# Patient Record
Sex: Male | Born: 1970 | Race: White | Hispanic: No | Smoking: Former smoker
Health system: Southern US, Community
[De-identification: ages and names within clinical notes are randomized; demographics above are authoritative.]

## PROBLEM LIST (undated history)

## (undated) DIAGNOSIS — I82409 Acute embolism and thrombosis of unspecified deep veins of unspecified lower extremity: Secondary | ICD-10-CM

## (undated) DIAGNOSIS — R569 Unspecified convulsions: Secondary | ICD-10-CM

## (undated) DIAGNOSIS — R7989 Other specified abnormal findings of blood chemistry: Secondary | ICD-10-CM

## (undated) DIAGNOSIS — I2699 Other pulmonary embolism without acute cor pulmonale: Secondary | ICD-10-CM

## (undated) DIAGNOSIS — S43003A Unspecified subluxation of unspecified shoulder joint, initial encounter: Secondary | ICD-10-CM

## (undated) DIAGNOSIS — I69891 Dysphagia following other cerebrovascular disease: Secondary | ICD-10-CM

## (undated) DIAGNOSIS — F329 Major depressive disorder, single episode, unspecified: Secondary | ICD-10-CM

## (undated) DIAGNOSIS — K219 Gastro-esophageal reflux disease without esophagitis: Secondary | ICD-10-CM

## (undated) DIAGNOSIS — N319 Neuromuscular dysfunction of bladder, unspecified: Secondary | ICD-10-CM

## (undated) DIAGNOSIS — I517 Cardiomegaly: Secondary | ICD-10-CM

## (undated) DIAGNOSIS — Z982 Presence of cerebrospinal fluid drainage device: Secondary | ICD-10-CM

## (undated) DIAGNOSIS — R519 Headache, unspecified: Secondary | ICD-10-CM

## (undated) DIAGNOSIS — J45909 Unspecified asthma, uncomplicated: Secondary | ICD-10-CM

## (undated) DIAGNOSIS — K592 Neurogenic bowel, not elsewhere classified: Secondary | ICD-10-CM

## (undated) DIAGNOSIS — I639 Cerebral infarction, unspecified: Secondary | ICD-10-CM

## (undated) DIAGNOSIS — I10 Essential (primary) hypertension: Secondary | ICD-10-CM

## (undated) DIAGNOSIS — H53469 Homonymous bilateral field defects, unspecified side: Secondary | ICD-10-CM

## (undated) DIAGNOSIS — N390 Urinary tract infection, site not specified: Secondary | ICD-10-CM

## (undated) DIAGNOSIS — G8190 Hemiplegia, unspecified affecting unspecified side: Secondary | ICD-10-CM

## (undated) DIAGNOSIS — G629 Polyneuropathy, unspecified: Secondary | ICD-10-CM

## (undated) HISTORY — DX: Homonymous bilateral field defects, unspecified side: H53.469

## (undated) HISTORY — DX: Dysphagia following other cerebrovascular disease: I69.891

## (undated) HISTORY — DX: Hemiplegia, unspecified affecting unspecified side: G81.90

## (undated) HISTORY — DX: Gastro-esophageal reflux disease without esophagitis: K21.9

## (undated) HISTORY — DX: Other pulmonary embolism without acute cor pulmonale: I26.99

## (undated) HISTORY — DX: Polyneuropathy, unspecified: G62.9

## (undated) HISTORY — DX: Cardiomegaly: I51.7

## (undated) HISTORY — DX: Neurogenic bowel, not elsewhere classified: K59.2

## (undated) HISTORY — DX: Headache, unspecified: R51.9

## (undated) HISTORY — DX: Unspecified subluxation of unspecified shoulder joint, initial encounter: S43.003A

## (undated) HISTORY — DX: Other specified abnormal findings of blood chemistry: R79.89

## (undated) HISTORY — DX: Neuromuscular dysfunction of bladder, unspecified: N31.9

## (undated) HISTORY — DX: Presence of cerebrospinal fluid drainage device: Z98.2

---

## 2015-01-25 DIAGNOSIS — Z8673 Personal history of transient ischemic attack (TIA), and cerebral infarction without residual deficits: Secondary | ICD-10-CM

## 2015-01-25 HISTORY — DX: Personal history of transient ischemic attack (TIA), and cerebral infarction without residual deficits: Z86.73

## 2015-01-25 HISTORY — PX: IVC FILTER INSERTION: CATH118245

## 2015-01-25 HISTORY — PX: PEG TUBE PLACEMENT: SUR1034

## 2015-01-25 HISTORY — PX: CRANIECTOMY: SHX331

## 2016-01-13 DIAGNOSIS — I2699 Other pulmonary embolism without acute cor pulmonale: Secondary | ICD-10-CM

## 2016-01-13 HISTORY — DX: Other pulmonary embolism without acute cor pulmonale: I26.99

## 2016-01-21 ENCOUNTER — Encounter: Payer: Self-pay | Admitting: Primary Care

## 2016-01-23 ENCOUNTER — Encounter: Payer: Self-pay | Admitting: Primary Care

## 2016-01-25 ENCOUNTER — Encounter: Payer: Self-pay | Admitting: Primary Care

## 2016-01-25 HISTORY — PX: CRANIOPLASTY: SHX1407

## 2016-01-25 HISTORY — PX: VENTRICULOPERITONEAL SHUNT: SHX204

## 2016-01-25 LAB — PROTIME-INR

## 2016-01-27 ENCOUNTER — Encounter: Payer: Self-pay | Admitting: Primary Care

## 2016-02-05 ENCOUNTER — Encounter: Payer: Self-pay | Admitting: Primary Care

## 2016-02-07 ENCOUNTER — Encounter: Payer: Self-pay | Admitting: Primary Care

## 2016-02-15 ENCOUNTER — Encounter: Payer: Self-pay | Admitting: Primary Care

## 2016-02-21 ENCOUNTER — Encounter: Payer: Self-pay | Admitting: Primary Care

## 2016-03-02 ENCOUNTER — Encounter: Payer: Self-pay | Admitting: Primary Care

## 2016-03-07 ENCOUNTER — Encounter: Payer: Self-pay | Admitting: Primary Care

## 2016-04-13 ENCOUNTER — Encounter: Payer: Self-pay | Admitting: Primary Care

## 2016-04-13 DIAGNOSIS — S43003A Unspecified subluxation of unspecified shoulder joint, initial encounter: Secondary | ICD-10-CM

## 2016-04-13 DIAGNOSIS — I517 Cardiomegaly: Secondary | ICD-10-CM

## 2016-04-13 HISTORY — DX: Unspecified subluxation of unspecified shoulder joint, initial encounter: S43.003A

## 2016-04-13 HISTORY — DX: Cardiomegaly: I51.7

## 2016-05-06 ENCOUNTER — Encounter: Payer: Self-pay | Admitting: Primary Care

## 2016-05-09 ENCOUNTER — Encounter: Payer: Self-pay | Admitting: Primary Care

## 2017-04-23 ENCOUNTER — Encounter (HOSPITAL_COMMUNITY): Payer: Self-pay | Admitting: Emergency Medicine

## 2017-04-23 ENCOUNTER — Emergency Department (HOSPITAL_COMMUNITY): Payer: 59

## 2017-04-23 ENCOUNTER — Emergency Department (HOSPITAL_COMMUNITY)
Admission: EM | Admit: 2017-04-23 | Discharge: 2017-04-23 | Disposition: A | Discharge status: Home / Self Care | Payer: 59 | Attending: Emergency Medicine | Admitting: Emergency Medicine

## 2017-04-23 ENCOUNTER — Other Ambulatory Visit: Payer: Self-pay

## 2017-04-23 DIAGNOSIS — J45909 Unspecified asthma, uncomplicated: Secondary | ICD-10-CM | POA: Diagnosis not present

## 2017-04-23 DIAGNOSIS — R569 Unspecified convulsions: Secondary | ICD-10-CM | POA: Diagnosis not present

## 2017-04-23 DIAGNOSIS — F329 Major depressive disorder, single episode, unspecified: Secondary | ICD-10-CM | POA: Insufficient documentation

## 2017-04-23 DIAGNOSIS — I10 Essential (primary) hypertension: Secondary | ICD-10-CM | POA: Diagnosis not present

## 2017-04-23 DIAGNOSIS — Z8673 Personal history of transient ischemic attack (TIA), and cerebral infarction without residual deficits: Secondary | ICD-10-CM | POA: Insufficient documentation

## 2017-04-23 HISTORY — DX: Gastro-esophageal reflux disease without esophagitis: K21.9

## 2017-04-23 HISTORY — DX: Unspecified convulsions: R56.9

## 2017-04-23 HISTORY — DX: Unspecified asthma, uncomplicated: J45.909

## 2017-04-23 HISTORY — DX: Urinary tract infection, site not specified: N39.0

## 2017-04-23 HISTORY — DX: Essential (primary) hypertension: I10

## 2017-04-23 HISTORY — DX: Acute embolism and thrombosis of unspecified deep veins of unspecified lower extremity: I82.409

## 2017-04-23 HISTORY — DX: Major depressive disorder, single episode, unspecified: F32.9

## 2017-04-23 HISTORY — DX: Cerebral infarction, unspecified: I63.9

## 2017-04-23 LAB — CBC WITH DIFFERENTIAL/PLATELET
BASOS PCT: 0 %
Basophils Absolute: 0 10*3/uL (ref 0.0–0.1)
EOS PCT: 3 %
Eosinophils Absolute: 0.2 10*3/uL (ref 0.0–0.7)
HCT: 45.5 % (ref 39.0–52.0)
Hemoglobin: 14.3 g/dL (ref 13.0–17.0)
Lymphocytes Relative: 30 %
Lymphs Abs: 2.1 10*3/uL (ref 0.7–4.0)
MCH: 27.5 pg (ref 26.0–34.0)
MCHC: 31.4 g/dL (ref 30.0–36.0)
MCV: 87.5 fL (ref 78.0–100.0)
MONO ABS: 0.5 10*3/uL (ref 0.1–1.0)
Monocytes Relative: 8 %
Neutro Abs: 4.1 10*3/uL (ref 1.7–7.7)
Neutrophils Relative %: 59 %
PLATELETS: 173 10*3/uL (ref 150–400)
RBC: 5.2 MIL/uL (ref 4.22–5.81)
RDW: 13.9 % (ref 11.5–15.5)
WBC: 6.9 10*3/uL (ref 4.0–10.5)

## 2017-04-23 LAB — COMPREHENSIVE METABOLIC PANEL
ALBUMIN: 4 g/dL (ref 3.5–5.0)
ALT: 21 U/L (ref 17–63)
ANION GAP: 13 (ref 5–15)
AST: 19 U/L (ref 15–41)
Alkaline Phosphatase: 104 U/L (ref 38–126)
BUN: 12 mg/dL (ref 6–20)
CO2: 23 mmol/L (ref 22–32)
Calcium: 8.6 mg/dL — ABNORMAL LOW (ref 8.9–10.3)
Chloride: 106 mmol/L (ref 101–111)
Creatinine, Ser: 0.77 mg/dL (ref 0.61–1.24)
GFR calc Af Amer: >60 mL/min (ref >60)
Glucose, Bld: 110 mg/dL — ABNORMAL HIGH (ref 65–99)
POTASSIUM: 3.7 mmol/L (ref 3.5–5.1)
Sodium: 142 mmol/L (ref 135–145)
Total Bilirubin: 0.5 mg/dL (ref 0.3–1.2)
Total Protein: 6.2 g/dL — ABNORMAL LOW (ref 6.5–8.1)

## 2017-04-23 MED ORDER — LACOSAMIDE 50 MG PO TABS
100.0000 mg | ORAL_TABLET | Freq: Two times a day (BID) | ORAL | Status: DC
  Administered 2017-04-23: 100 mg via ORAL
  Filled 2017-04-23: qty 2

## 2017-04-23 MED ORDER — SODIUM CHLORIDE 0.9 % IV BOLUS
1000.0000 mL | Freq: Once | INTRAVENOUS | Status: AC
  Administered 2017-04-23: 1000 mL via INTRAVENOUS

## 2017-04-23 MED ORDER — LORAZEPAM 2 MG/ML IJ SOLN
0.5000 mg | Freq: Once | INTRAMUSCULAR | Status: AC
  Administered 2017-04-23: 0.5 mg via INTRAVENOUS
  Filled 2017-04-23: qty 1

## 2017-04-23 NOTE — ED Notes (Signed)
Pt understood dc material. NAD noted

## 2017-04-23 NOTE — Discharge Instructions (Addendum)
Continue your current medications without change. Call [facility] for follow up appointment

## 2017-04-23 NOTE — ED Provider Notes (Signed)
[facility] EMERGENCY [facility] Provider Note   CSN: [id number] Arrival date & time: 04/23/17  1724     History   Chief Complaint Chief Complaint  Patient presents with   Seizures    HPI Rinaldo RatelChristopher L Bick is a 47 y.o. male.  Chief complaint is seizure.  HPI: 47 year old male.  History of previous CVA.  History of previous craniectomy due to increased ICPs during acute hospitalization of seizures.  Has VP shunt.  Has seizure disorder.    Past Medical History:  Diagnosis Date   Asthma    Depression    DVT (deep venous thrombosis) (HCC)    GERD (gastroesophageal reflux disease)    Hypertension    Seizures (HCC)    Stroke (HCC) 12/31/2015   left side deficits    UTI (urinary tract infection)     There are no active problems to display for this patient.   Past Surgical History:  Procedure Laterality Date   CRANIECTOMY  2017   CRANIOPLASTY  2018   IVC FILTER INSERTION  2017        Home Medications    Prior to Admission medications   Not on File    Family History No family history on file.  Social History Social History   Tobacco Use   Smoking status: Never Smoker   Smokeless tobacco: Never Used  Substance Use Topics   Alcohol use: Not Currently    Frequency: Never   Drug use: Never     Allergies   Versed [midazolam]   Review of Systems Review of Systems   Physical Exam Updated Vital Signs BP 127/86    Pulse 95    Temp 97.6 F (36.4 C) (Oral)    Resp 16    SpO2 100%   Physical Exam   ED Treatments / Results  Labs (all labs ordered are listed, but only abnormal results are displayed) Labs Reviewed  COMPREHENSIVE METABOLIC PANEL - Abnormal; Notable for the following components:      Result Value   Glucose, Bld 110 (*)    Calcium 8.6 (*)    Total Protein 6.2 (*)    All other components within normal limits  CBC WITH DIFFERENTIAL/PLATELET    EKG EKG Interpretation  Date/Time:  Sunday April 23 2017 17:34:06 EDT Ventricular Rate:  92 PR Interval:    QRS Duration: 100 QT Interval:  366 QTC Calculation: 453 R Axis:   49 Text Interpretation:  Sinus rhythm No old tracing to compare Confirmed by Jerelyn ScottLinker, Martha [phone]([id number]) on 04/24/2017 7:47:08 PM   Radiology Ct Head Wo Contrast  Result Date: 04/23/2017 CLINICAL DATA:  Seizure EXAM: CT HEAD WITHOUT CONTRAST TECHNIQUE: Contiguous axial images were obtained from the base of the skull through the vertex without intravenous contrast. COMPARISON:  None. FINDINGS: Brain: Severe encephalomalacia of the entire right MCA territory. Left parietal approach shunt catheter tip is in the left lateral ventricle. No hydrocephalus. There is a hyperdense focus in the right lateral ventricle measuring 7 x 6 mm. There is ex vacuo rightward midline shift. No focal parenchymal abnormality of the left hemisphere. Vascular: No hyperdense vessel or unexpected vascular calcification. Skull: Remote right pterional craniotomy. Sinuses/Orbits: No sinus fluid levels or advanced mucosal thickening. No mastoid effusion. Normal orbits. IMPRESSION: 1. Left parietal approach shunt catheter tip in the left lateral ventricle. No hydrocephalus. 2. Complete right MCA territory encephalomalacia. 3. Hyperdense focus in the anterior right lateral ventricle is indeterminate, possibly choroid plexus physiologic calcification or partially  calcified choroid plexus cyst, though this is relatively far anterior for the choroid plexus. This would be an unusual location for hemorrhage. If prior studies are available, comparison would be helpful. Electronically Signed   By: Deatra Robinson M.D.   On: 04/23/2017 19:24    Procedures Procedures (including critical care time)  Medications Ordered in ED Medications  sodium chloride 0.9 % bolus 1,000 mL (0 mLs Intravenous Stopped 04/23/17 1926)  LORazepam (ATIVAN) injection 0.5 mg (0.5 mg Intravenous Given 04/23/17 1855)     Initial Impression /  Assessment and Plan / ED Course  I have reviewed the triage vital signs and the nursing notes.  Pertinent labs & imaging results that were available during my care of the patient were reviewed by me and considered in my medical decision making (see chart for details).    CT without changes suggestive of shunt malfunction.  Remains awake alert oriented.  Plan discharged home.  Neurological follow-up locally.  Recheck recurrence.  Final Clinical Impressions(s) / ED Diagnoses   Final diagnoses:  Seizure [facility])    ED Discharge Orders    None       Rolland Porter, MD 04/24/17 2341

## 2017-04-23 NOTE — ED Triage Notes (Signed)
Pt arrives via EMS from home with reports of seizure lasting 10 mins witnessed by family. Hx of CVA with left side deficits and 2 seizures. Denies pain. Pt has been nauseous and vomited multiple times. EMS gave 8mg  zofran CBG 123, 131/89, HR 100, 98% RA

## 2017-04-24 ENCOUNTER — Encounter: Payer: Self-pay | Admitting: Neurology

## 2017-04-26 ENCOUNTER — Ambulatory Visit: Payer: Self-pay | Admitting: Primary Care

## 2017-05-01 ENCOUNTER — Encounter: Payer: Self-pay | Admitting: Neurology

## 2017-05-01 ENCOUNTER — Other Ambulatory Visit: Payer: Self-pay

## 2017-05-01 ENCOUNTER — Ambulatory Visit (INDEPENDENT_AMBULATORY_CARE_PROVIDER_SITE_OTHER): Payer: PRIVATE HEALTH INSURANCE | Admitting: Neurology

## 2017-05-01 VITALS — BP 114/80 | HR 72 | Ht 68.0 in | Wt 175.0 lb

## 2017-05-01 DIAGNOSIS — Z9889 Other specified postprocedural states: Secondary | ICD-10-CM

## 2017-05-01 DIAGNOSIS — Z8673 Personal history of transient ischemic attack (TIA), and cerebral infarction without residual deficits: Secondary | ICD-10-CM

## 2017-05-01 DIAGNOSIS — G40209 Localization-related (focal) (partial) symptomatic epilepsy and epileptic syndromes with complex partial seizures, not intractable, without status epilepticus: Secondary | ICD-10-CM | POA: Diagnosis not present

## 2017-05-01 MED ORDER — METHYLPHENIDATE HCL ER (LA) 60 MG PO CP24: 1.0000 | ORAL_CAPSULE | Freq: Every day | ORAL | 0 refills | Status: DC

## 2017-05-01 MED ORDER — CLONAZEPAM 0.5 MG PO TBDP: ORAL_TABLET | ORAL | 5 refills | Status: DC

## 2017-05-01 MED ORDER — LACOSAMIDE 150 MG PO TABS: ORAL_TABLET | ORAL | 5 refills | Status: DC

## 2017-05-01 NOTE — Patient Instructions (Signed)
1. Increase Vimpat to 150mg  twice a day 2. Continue all other medications. We can prescribe Ritalin until you see your PCP 3. May give clonazepam 0.5mg  as needed for seizure 4. Refer to Neurosurgery for local follow-up  5. Follow-up in 4-5 months, call for any changes  Seizure Precautions: 1. If medication has been prescribed for you to prevent seizures, take it exactly as directed.  Do not stop taking the medicine without talking to your doctor first, even if you have not had a seizure in a long time.   2. Avoid activities in which a seizure would cause danger to yourself or to others.  Don't operate dangerous machinery, swim alone, or climb in high or dangerous places, such as on ladders, roofs, or girders.  Do not drive unless your doctor says you may.  3. If you have any warning that you may have a seizure, lay down in a safe place where you can't hurt yourself.    4.  No driving for 6 months from last seizure, as per [facility] [place].   Please refer to the following link on the Epilepsy Foundation of America's website for more information: http://www.epilepsyfoundation.org/answerplace/Social/driving/drivingu.cfm   5.  Maintain good sleep hygiene. Avoid alcohol.  Benay Pillow.  Contact your doctor if you have any problems that may be related to the medicine you are taking.  7.  Call 911 and bring the patient back to the ED if:        A.  The seizure lasts longer than 5 minutes.       B.  The patient doesn't awaken shortly after the seizure  C.  The patient has new problems such as difficulty seeing, speaking or moving  D.  The patient was injured during the seizure  E.  The patient has a temperature over 102 F (39C)  F.  The patient vomited and now is having trouble breathing

## 2017-05-01 NOTE — Progress Notes (Signed)
NEUROLOGY CONSULTATION NOTE  Patrick Franklin MRN: [id number] DOB: [birth year]  Referring provider: Dr. Rolland Porter Primary care provider: Vernona Rieger, NP  Reason for consult:  Seizures, history of stroke  Thank you for your kind referral of Patrick Franklin for consultation of the above symptoms. Although his history is well known to you, please allow me to reiterate it for the purpose of our medical record. The patient was accompanied to the clinic by his sister and brother-in-law who also provide collateral information. Records and images were personally reviewed where available.  HISTORY OF PRESENT ILLNESS: This is a pleasant 47 year old right-handed man with a history of right MCA stroke s/p hemicraniectomy with subsequent seizures, presenting to establish care. He and his sister report that he had a stroke in December 2017, then has had 3 seizures since then. The first occurred summer 2018 with no prior warning, he woke up with sore muscles, tongue bite and urinary incontinence. The second seizure occurred in February 2019 in his sleep where his wife reported extremities were extended with tongue bite. The most recent one was last 04/23/17 witnessed by his family. His sister reports they were eating then he slumped forward and arms stiffened with shaking for 2 minutes, left arm was drawn up. He was unresponsive with head turn to the left. After 2-3 minutes, he would calm down, then start having movements but able to answer their questions. He had a lot of vomiting after both seizures. He reports a lot of pressure behind his eyes when he had the seizure. He was brought to [facility] ER where CBC and CMP were unremarkable. I personally reviewed head CT without contrast which showed severe right MCA encephalomalacia, left parietal approach shunt catheter tip in left lateral ventricle, no hydrocephalus. There was a hyperdense focus in the right lateral ventricle, ex vacuo rightward midline shift. He  was continued on Vimpat 100mg  BID. He denies any missed medications or sleep deprivation, but there has been a lot of stress with moving to live with his sister and brother-in-law away from his wife. They have a sitter with him during the day while his family is working.   He has pressure behind his eyes on a daily basis. Eye exam reported as normal. He has some neck pain. He has residual weakness on the left side, denies any numbness/tingling or pain. He denies any olfactory/gustatory hallucinations, deja vu, rising epigastric sensation, myoclonic jerks. Mood is okay. His sister has noticed he stares off at times, but replies when called. Family fills his pillbox for him. He is also taking Ritalin prescribed at rehab because he was having attention difficulties and needed prompting to initiate activities, not progressing with therapy. They feel this has been very helpful for him, his brother-in-law noticed a difference in interaction when he ran out of medication, he was less interactive. He states he does not take it all the time. There is a family history of stroke. No family history of seizures.  Epilepsy Risk Factors:  Complete right MCA territory encephalomalacia s/p right hemicraniectomy. Otherwise he had a normal birth and early development.  There is no history of febrile convulsions, CNS infections such as meningitis/encephalitis, or family history of seizures.  PAST MEDICAL HISTORY: Past Medical History:  Diagnosis Date   Asthma    Depression    DVT (deep venous thrombosis) (HCC)    GERD (gastroesophageal reflux disease)    Hypertension    Seizures (HCC)    Stroke (HCC) 12/31/2015  left side deficits    UTI (urinary tract infection)     PAST SURGICAL HISTORY: Past Surgical History:  Procedure Laterality Date   CRANIECTOMY  2017   CRANIOPLASTY  2018   IVC FILTER INSERTION  2017    MEDICATIONS: Vimpat 100mg  BID Ritalin LA 60mg  daily   ALLERGIES: Allergies   Allergen Reactions   Versed [Midazolam]     FAMILY HISTORY: Family History  Problem Relation Age of Onset   Stroke Maternal Uncle    Stroke Maternal Uncle     SOCIAL HISTORY: Social History   Socioeconomic History   Marital status: Single    Spouse name: Not on file   Number of children: Not on file   Years of education: Not on file   Highest education level: Not on file  Occupational History   Not on file  Social Needs   Financial resource strain: Not on file   Food insecurity:    Worry: Not on file    Inability: Not on file   Transportation needs:    Medical: Not on file    Non-medical: Not on file  Tobacco Use   Smoking status: Never Smoker   Smokeless tobacco: Never Used  Substance and Sexual Activity   Alcohol use: Not Currently    Frequency: Never   Drug use: Never   Sexual activity: Not on file  Lifestyle   Physical activity:    Days per week: Not on file    Minutes per session: Not on file   Stress: Not on file  Relationships   Social connections:    Talks on phone: Not on file    Gets together: Not on file    Attends religious service: Not on file    Active member of club or organization: Not on file    Attends meetings of clubs or organizations: Not on file    Relationship status: Not on file   Intimate partner violence:    Fear of current or ex partner: Not on file    Emotionally abused: Not on file    Physically abused: Not on file    Forced sexual activity: Not on file  Other Topics Concern   Not on file  Social History Narrative   Not on file    REVIEW OF SYSTEMS: Constitutional: No fevers, chills, or sweats, no generalized fatigue, change in appetite Eyes: No visual changes, double vision, eye pain Ear, nose and throat: No hearing loss, ear pain, nasal congestion, sore throat Cardiovascular: No chest pain, palpitations Respiratory:  No shortness of breath at rest or with exertion, wheezes GastrointestinaI: No  nausea, vomiting, diarrhea, abdominal pain, fecal incontinence Genitourinary:  No dysuria, urinary retention or frequency Musculoskeletal:  + neck pain, back pain Integumentary: No rash, pruritus, skin lesions Neurological: as above Psychiatric: No depression, insomnia, anxiety Endocrine: No palpitations, fatigue, diaphoresis, mood swings, change in appetite, change in weight, increased thirst Hematologic/Lymphatic:  No anemia, purpura, petechiae. Allergic/Immunologic: no itchy/runny eyes, nasal congestion, recent allergic reactions, rashes  PHYSICAL EXAM: Vitals:   05/01/17 1408  BP: 114/80  Pulse: 72  SpO2: 97%   General: No acute distress, sitting on wheelchair Head:  Normocephalic/atraumatic Eyes: Fundoscopic exam shows bilateral sharp discs, no vessel changes, exudates, or hemorrhages Neck: supple, no paraspinal tenderness, full range of motion Back: No paraspinal tenderness Heart: regular rate and rhythm Lungs: Clear to auscultation bilaterally. Vascular: No carotid bruits. Skin/Extremities: No rash, no edema Neurological Exam: Mental status: alert and oriented to person, place,  and month, states year is 2018, slightly slow to respond but accurate responses with minimal  Dysarthria, no aphasia, Fund of knowledge is appropriate.  Recent and remote memory are intact. 3/3 delayed recall.  Attention and concentration are normal.    Able to name objects and repeat phrases. Cranial nerves: CN I: not tested CN II: pupils equal, round and reactive to light, visual fields intact but with left-sided neglect on double simultaneous stimulation, fundi unremarkable. CN III, IV, VI:  full range of motion, no nystagmus, no ptosis CN V: facial sensation intact CN VII: left facial droop CN VIII: hearing intact to finger rub CN IX, X: gag intact, uvula midline CN XI: sternocleidomastoid and trapezius muscles intact CN XII: tongue midline Bulk & Tone: normal, no fasciculations. Motor: 5/5 on  right UE and LE, 2/5 on left UE and LE Sensation: intact to light touch, cold, pin, vibration and joint position sense.  No extinction to double simultaneous stimulation.  Deep Tendon Reflexes: brisk +2 on left UE and LE, +1 on right UE and LE, no ankle clonus Plantar responses: upgoing on left, downgoing on right Cerebellar: no incoordination on finger to nose on right, difficulty with left Gait: not tested Tremor: none  IMPRESSION: This is a pleasant 47 year old right-handed man with a history of large right MCA stroke, ?etiology with subsequent focal to bilateral tonic-clonic seizures. Records from his prior neurologist will be requested for review. He recently moved to [facility] and is establishing care. Last seizure was 04/23/17, we discussed increasing Vimpat to 150mg  BID. His sister was given a prescription for prn clonazepam 0.5mg  for breakthrough seizure. They would like a referral for a local neurosurgeon for follow-up on the cranioplasty. He has been on Ritalin for attention deficits post-stroke, I discussed with them that our office does not prescribe stimulants, however since he has not established care with his new PCP, I will send in 1 refill until he sees his new PCP. He does not drive. He will follow-up in 4-5 months and knows to call for any changes.   Thank you for allowing me to participate in the care of this patient. Please do not hesitate to call for any questions or concerns.   Patrcia Dolly, M.D.  CC: Vernona Rieger, NP, Dr. Fayrene Fearing

## 2017-05-02 ENCOUNTER — Telehealth: Payer: Self-pay

## 2017-05-02 NOTE — Telephone Encounter (Signed)
Rcvd call from [place] at [facility]. The Ritalin Rx faxed in can not be filled, Pt must bring hardcopy. Called Pt, he is to have his sister p/u hardcopy.

## 2017-05-08 ENCOUNTER — Encounter: Payer: Self-pay | Admitting: Neurology

## 2017-05-15 ENCOUNTER — Encounter: Payer: Self-pay | Admitting: Primary Care

## 2017-05-15 ENCOUNTER — Ambulatory Visit (INDEPENDENT_AMBULATORY_CARE_PROVIDER_SITE_OTHER): Payer: PRIVATE HEALTH INSURANCE | Admitting: Primary Care

## 2017-05-15 VITALS — BP 140/90 | HR 79 | Temp 97.8°F | Ht 68.0 in | Wt 175.0 lb

## 2017-05-15 DIAGNOSIS — F329 Major depressive disorder, single episode, unspecified: Secondary | ICD-10-CM | POA: Insufficient documentation

## 2017-05-15 DIAGNOSIS — I10 Essential (primary) hypertension: Secondary | ICD-10-CM

## 2017-05-15 DIAGNOSIS — R339 Retention of urine, unspecified: Secondary | ICD-10-CM | POA: Diagnosis not present

## 2017-05-15 DIAGNOSIS — E785 Hyperlipidemia, unspecified: Secondary | ICD-10-CM | POA: Diagnosis not present

## 2017-05-15 DIAGNOSIS — Z8673 Personal history of transient ischemic attack (TIA), and cerebral infarction without residual deficits: Secondary | ICD-10-CM

## 2017-05-15 DIAGNOSIS — F3342 Major depressive disorder, recurrent, in full remission: Secondary | ICD-10-CM

## 2017-05-15 HISTORY — DX: Major depressive disorder, single episode, unspecified: F32.9

## 2017-05-15 HISTORY — DX: Essential (primary) hypertension: I10

## 2017-05-15 HISTORY — DX: Hyperlipidemia, unspecified: E78.5

## 2017-05-15 MED ORDER — DOXAZOSIN MESYLATE 1 MG PO TABS: ORAL_TABLET | ORAL | 3 refills | Status: DC

## 2017-05-15 MED ORDER — ATORVASTATIN CALCIUM 80 MG PO TABS: ORAL_TABLET | ORAL | 3 refills | Status: DC

## 2017-05-15 NOTE — Assessment & Plan Note (Signed)
Managed on Cymbalta 60 mg since CVA in 2017. Continue same. Denies SI/HI.

## 2017-05-15 NOTE — Assessment & Plan Note (Signed)
Repeat lipids pending today, continue atorvastatin 80 mg.

## 2017-05-15 NOTE — Assessment & Plan Note (Signed)
Managed on Doxazosin, denies urinary retention problems. Continue same. BP stable.

## 2017-05-15 NOTE — Patient Instructions (Addendum)
Start weaning off of Ritalin. Take 1 capsule by mouth every other day for 10 days then stop.  I will speak with your Neurologist regarding baclofen and dantrolene as these medications are very similar.   Stop by the lab prior to leaving today. I will notify you of your results once received.   Monitor your blood pressure at home and notify me if you see readings at or above 135/90.   Please schedule a follow up appointment in 6 months.   It was a pleasure to meet you today! Please don't hesitate to call or message me with any questions. Welcome to [facility]!

## 2017-05-15 NOTE — Progress Notes (Signed)
Subjective:    Patient ID: Patrick Franklin, male    DOB: [birth year], 47 y.o.   MRN: [id number]  HPI  Patrick Franklin is a 47 year old male who presents today to establish care and discuss the problems mentioned below. Will obtain old records.  1) Seizure Disorder: History of right MCA stroke status post hemicraniectomy with subsequent seizures in December 2017. He recently established care with Neurology on 05/01/17. His most recent seizure was on 04/23/17 witness by family members. [facility], underwent CT head which was negative for hydrocephalus, complete right MCA territory encephalomalacia, hyperdense focus to right lateral ventricle. He was managed on Vimpat and denied missing any medications. He had been under a lot of stress since moving in with family from [place].  During his visit with Neurology his Vimpat was increased to 150 mg BID. He is managed on clonazepam 0.5 mg PRN breakthrough seizures, both baclofen and dantrolene for muscle spacticity? The patient also requested a refill of Ritalin for attention deficits post stroke, one refill was granted per Neurology and then deferred to PCP.  2) Hyperlipidemia: Currently managed on atorvastatin 80 mg. His last cholesterol check was within the past one year.   3) Depression/ADHD status post stroke: Currently managed on Cymbalta 60 mg, Ritalin LA 60 mg. Managed on since stroke in December 2017. He thinks he was placed on Ritalin LA during inpatient rehabilitation as he had a difficult time focusing during physical therapy. He has missed a few doses of medication in the past and hasn't noticed any difference.   4) MCA Stroke: Occurred in December 2017. Also with subsequent seizures. Currently managed on baclofen 10 mg and dantrolene 100 mg for spasms. He's taking both baclofen and dantrolene three times daily for right upper and lower extremity spasms. Patient has residual left upper and lower extremity weakness,  left sided neglect as residual from stroke.  5) Essential Hypertension: Currently managed on metoprolol tartrate 25 mg BID, doxazosin 1 mg. Managed on doxazosin for urinary retention that was prescribed per Urology in [place] last year. He does not check his BP at home.   BP Readings from Last 3 Encounters:  05/15/17 140/90  05/01/17 114/80  04/23/17 127/86     Review of Systems  Eyes: Negative for visual disturbance.  Respiratory: Negative for shortness of breath.   Cardiovascular: Negative for chest pain.  Neurological: Negative for dizziness, seizures and headaches.       Residual left sided weakness secondary to stroke, no new weakness.  Psychiatric/Behavioral: Negative for decreased concentration and suicidal ideas.       Past Medical History:  Diagnosis Date   Asthma    Depression    DVT (deep venous thrombosis) (HCC)    GERD (gastroesophageal reflux disease)    Hypertension    Seizures (HCC)    Stroke (HCC) 12/31/2015   left side deficits    UTI (urinary tract infection)      Social History   Socioeconomic History   Marital status: Single    Spouse name: Not on file   Number of children: Not on file   Years of education: Not on file   Highest education level: Not on file  Occupational History   Not on file  Social Needs   Financial resource strain: Not on file   Food insecurity:    Worry: Not on file    Inability: Not on file   Transportation needs:  Medical: Not on file    Non-medical: Not on file  Tobacco Use   Smoking status: Never Smoker   Smokeless tobacco: Never Used  Substance and Sexual Activity   Alcohol use: Not Currently    Frequency: Never   Drug use: Never   Sexual activity: Not on file  Lifestyle   Physical activity:    Days per week: Not on file    Minutes per session: Not on file   Stress: Not on file  Relationships   Social connections:    Talks on phone: Not on file    Gets together: Not on  file    Attends religious service: Not on file    Active member of club or organization: Not on file    Attends meetings of clubs or organizations: Not on file    Relationship status: Not on file   Intimate partner violence:    Fear of current or ex partner: Not on file    Emotionally abused: Not on file    Physically abused: Not on file    Forced sexual activity: Not on file  Other Topics Concern   Not on file  Social History Narrative   Single.   4 children.    Once worked as an Teacher, adult education.    Enjoys reading.     Past Surgical History:  Procedure Laterality Date   CRANIECTOMY  2017   CRANIOPLASTY  2018   IVC FILTER INSERTION  2017   PEG TUBE PLACEMENT  2017   VENTRICULOPERITONEAL SHUNT  2018    Family History  Problem Relation Age of Onset   Stroke Maternal Uncle    Stroke Maternal Uncle     Allergies  Allergen Reactions   Versed [Midazolam]     Current Outpatient Medications on File Prior to Visit  Medication Sig Dispense Refill   aspirin EC 81 MG tablet Take 81 mg by mouth daily.     baclofen (LIORESAL) 10 MG tablet Take 25 mg by mouth 3 (three) times daily.     Cholecalciferol (VITAMIN D3) 5000 units CAPS Take 5,000 Units by mouth daily.     dantrolene (DANTRIUM) 100 MG capsule Take 100 mg by mouth 3 (three) times daily.     DULoxetine (CYMBALTA) 60 MG capsule Take 60 mg by mouth daily.     Lacosamide 150 MG TABS Take 1 tablet twice a day 60 tablet 5   Melatonin 3 MG TABS Take by mouth.     Methylphenidate HCl ER, LA, (RITALIN LA) 60 MG CP24 Take 1 tablet by mouth daily. 30 capsule 0   metoprolol tartrate (LOPRESSOR) 25 MG tablet Take 25 mg by mouth 2 (two) times daily.     clonazePAM (KLONOPIN) 0.5 MG disintegrating tablet Administer 1 tablet as needed for seizure. (Patient not taking: Reported on 05/15/2017) 10 tablet 5   No current facility-administered medications on file prior to visit.     BP 140/90    Pulse 79    Temp 97.8 F (36.6  C) (Oral)    Ht 5\' 8"  (1.727 m)    Wt 175 lb (79.4 kg)    SpO2 96%    BMI 26.61 kg/m    Objective:   Physical Exam  Constitutional: He is oriented to person, place, and time. He appears well-nourished.  Neck: Neck supple.  Cardiovascular: Normal rate and regular rhythm.  Pulmonary/Chest: Effort normal and breath sounds normal. He has no wheezes. He has no rales.  Musculoskeletal:  In wheelchair today,  left sided weakness residual from stroke.  Neurological: He is alert and oriented to person, place, and time.  Skin: Skin is warm and dry.  Psychiatric: He has a normal mood and affect.          Assessment & Plan:

## 2017-05-15 NOTE — Assessment & Plan Note (Signed)
Stable on metoprolol tartrate and doxazosin, continue same.

## 2017-05-15 NOTE — Assessment & Plan Note (Signed)
Occurred in December 2017, left sided upper and lower extremity weakness and left side neglect as residual effects.  Following with neurology now. Continue Vimpat 150 mg BID as prescribed. Will send message to Neurology regarding baclofen and dantrolene for residual spacticity as these are very similar medications.  Use Clonazepam PRN breakthrough seizures as prescribed.  Will wean off Ritalin LA as it doesn't seem to be necessary to continue. His sister will update.

## 2017-05-16 LAB — COMPREHENSIVE METABOLIC PANEL
ALK PHOS: 112 U/L (ref 39–117)
ALT: 21 U/L (ref 0–53)
AST: 13 U/L (ref 0–37)
Albumin: 4.2 g/dL (ref 3.5–5.2)
BILIRUBIN TOTAL: 0.3 mg/dL (ref 0.2–1.2)
BUN: 16 mg/dL (ref 6–23)
CO2: 27 meq/L (ref 19–32)
Calcium: 9 mg/dL (ref 8.4–10.5)
Chloride: 110 mEq/L (ref 96–112)
Creatinine, Ser: 0.67 mg/dL (ref 0.40–1.50)
GFR: 136.1 mL/min (ref >60.00)
Glucose, Bld: 88 mg/dL (ref 70–99)
Potassium: 4.2 mEq/L (ref 3.5–5.1)
Sodium: 143 mEq/L (ref 135–145)
Total Protein: 6.5 g/dL (ref 6.0–8.3)

## 2017-05-16 LAB — LIPID PANEL
CHOL/HDL RATIO: 2
CHOLESTEROL: 91 mg/dL (ref 0–200)
HDL: 36.6 mg/dL — ABNORMAL LOW (ref >39.00)
LDL Cholesterol: 30 mg/dL (ref 0–99)
NonHDL: 54.44
TRIGLYCERIDES: 120 mg/dL (ref 0.0–149.0)
VLDL: 24 mg/dL (ref 0.0–40.0)

## 2017-05-18 ENCOUNTER — Encounter: Payer: Self-pay | Admitting: Primary Care

## 2017-05-24 ENCOUNTER — Encounter: Payer: Self-pay | Admitting: Primary Care

## 2017-05-24 ENCOUNTER — Telehealth: Payer: PRIVATE HEALTH INSURANCE | Admitting: Family

## 2017-05-24 DIAGNOSIS — R399 Unspecified symptoms and signs involving the genitourinary system: Secondary | ICD-10-CM

## 2017-05-24 MED ORDER — CEPHALEXIN 500 MG PO CAPS: 500.0000 mg | ORAL_CAPSULE | Freq: Two times a day (BID) | ORAL | 0 refills | Status: DC

## 2017-05-24 NOTE — Progress Notes (Signed)
We are sorry that you are not feeling well.  Here is how we plan to help!  Based on what you shared with me it looks like you most likely have a simple urinary tract infection.  A UTI (Urinary Tract Infection) is a bacterial infection of the bladder.  Most cases of urinary tract infections are simple to treat but a key part of your care is to encourage you to drink plenty of fluids and watch your symptoms carefully.  I have prescribed Keflex 500 mg twice a day for 7 days.  Your symptoms should gradually improve. Call us if the burning in your urine worsens, you develop worsening fever, back pain or pelvic pain or if your symptoms do not resolve after completing the antibiotic.  Your prescription was sent to [facility] on [address], [place], [place].  Urinary tract infections can be prevented by drinking plenty of water to keep your body hydrated.  Also be sure when you wipe, wipe from front to back and don't hold it in!  If possible, empty your bladder every 4 hours.  Your e-visit answers were reviewed by a board certified advanced clinical practitioner to complete your personal care plan.  Depending on the condition, your plan could have included both over the counter or prescription medications.  If there is a problem please reply  once you have received a response from your provider.  Your safety is important to [place].  If you have drug allergies check your prescription carefully.    You can use MyChart to ask questions about todays visit, request a non-urgent call back, or ask for a work or school excuse for 24 hours related to this e-Visit. If it has been greater than 24 hours you will need to follow up with your provider, or enter a new e-Visit to address those concerns.   You will get an e-mail in the next two days asking about your experience.  I hope that your e-visit has been valuable and will speed your recovery. Thank you for using e-visits.

## 2017-06-13 ENCOUNTER — Telehealth: Payer: Self-pay | Admitting: Neurology

## 2017-06-13 NOTE — Telephone Encounter (Signed)
Pt called and said Dr Karel Jarvis was referring him to a neuro surgeon but has not heard from anyone yet

## 2017-06-14 NOTE — Telephone Encounter (Signed)
Referral re-faxed to Neurosurgery

## 2017-07-11 ENCOUNTER — Encounter

## 2017-07-11 ENCOUNTER — Ambulatory Visit: Payer: PRIVATE HEALTH INSURANCE | Admitting: Neurology

## 2017-07-12 ENCOUNTER — Encounter: Payer: Self-pay | Admitting: Neurology

## 2017-08-02 ENCOUNTER — Encounter: Payer: Self-pay | Admitting: Neurology

## 2017-08-09 ENCOUNTER — Encounter: Payer: Self-pay | Admitting: Family Medicine

## 2017-08-09 ENCOUNTER — Ambulatory Visit (INDEPENDENT_AMBULATORY_CARE_PROVIDER_SITE_OTHER): Payer: 59 | Admitting: Family Medicine

## 2017-08-09 ENCOUNTER — Telehealth: Payer: Self-pay

## 2017-08-09 VITALS — BP 122/76 | HR 113 | Temp 98.2°F | Ht 68.0 in | Wt 187.0 lb

## 2017-08-09 DIAGNOSIS — N3001 Acute cystitis with hematuria: Secondary | ICD-10-CM

## 2017-08-09 DIAGNOSIS — I69354 Hemiplegia and hemiparesis following cerebral infarction affecting left non-dominant side: Secondary | ICD-10-CM | POA: Insufficient documentation

## 2017-08-09 DIAGNOSIS — R1031 Right lower quadrant pain: Secondary | ICD-10-CM | POA: Insufficient documentation

## 2017-08-09 DIAGNOSIS — G40909 Epilepsy, unspecified, not intractable, without status epilepticus: Secondary | ICD-10-CM

## 2017-08-09 DIAGNOSIS — R1032 Left lower quadrant pain: Secondary | ICD-10-CM

## 2017-08-09 DIAGNOSIS — R569 Unspecified convulsions: Secondary | ICD-10-CM | POA: Insufficient documentation

## 2017-08-09 HISTORY — DX: Epilepsy, unspecified, not intractable, without status epilepticus: G40.909

## 2017-08-09 HISTORY — DX: Hemiplegia and hemiparesis following cerebral infarction affecting left non-dominant side: I69.354

## 2017-08-09 LAB — POC URINALSYSI DIPSTICK (AUTOMATED)
Bilirubin, UA: NEGATIVE
GLUCOSE UA: NEGATIVE
NITRITE UA: NEGATIVE
PH UA: 7.5 (ref 5.0–8.0)
Protein, UA: POSITIVE — AB
Spec Grav, UA: 1.015 (ref 1.010–1.025)
UROBILINOGEN UA: 0.2 U/dL

## 2017-08-09 MED ORDER — CEPHALEXIN 500 MG PO CAPS: 500.0000 mg | ORAL_CAPSULE | Freq: Two times a day (BID) | ORAL | 0 refills | Status: DC

## 2017-08-09 NOTE — Telephone Encounter (Signed)
Noted, appreciate Dr. Nicanor AlconGutierrez's ability to see patient.

## 2017-08-09 NOTE — Telephone Encounter (Signed)
Mayra ReelKate Clark NP said pt would need to be seen and would need a u/a. Raynelle FanningJulie said pt cannot urinate at will and will need in and out cath. Jae DireKate said OK to do in and out cath but pt needs to be seen also; Jae DireKate does not have any available appts; pt to see Dr Reece AgarG 08/09/17 at [facility]. Lisa CMA notified. FYI to Dr Reece AgarG.

## 2017-08-09 NOTE — Telephone Encounter (Signed)
I spoke with Raynelle FanningJulie (DPR signed) pt having urgency, frequency of urine,burning with urine, restless, nausea and vomiting. No fever, no abd pain. Pt does not usually come in to office due to stroke. Raynelle FanningJulie wants to know if can [facility]. Raynelle FanningJulie request cb.

## 2017-08-09 NOTE — Progress Notes (Addendum)
BP 122/76 (BP Location: Left Arm, Patient Position: Sitting, Cuff Size: Normal)   Pulse (!) 113   Temp 98.2 F (36.8 C) (Oral)   Ht 5\' 8"  (1.727 m)   Wt 187 lb (84.8 kg)   SpO2 95%   BMI 28.43 kg/m    CC: ?UTI Subjective:    Patient ID: Rinaldo Ratel, male    DOB: February 17, 1970, 47 y.o.   MRN: [id number]  HPI: CURLEE ROZZI is a 47 y.o. male presenting on 08/09/2017 for Abdominal Pain (Cramping on left side. Also, c/o nausea and vomting. Sxs started today. Pt is also, fidgety. Per sister, last time pt had these sxs he had UTI. Pt is accompanied by sister and brother-in-law. ) and Competency Evaluation ([name] sister is requesting a competency eval.  Pt and his 38 yr old daughter live with her and her husband. Says pt is separated from his wife so she is concerned about him being able to care for her. )   New patient to me. Here with sister and brother in law.  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  H/o seizures after stroke - last seizure was Monday that lasted 3-4 minutes. Has had 4 seizures since he's been in [place]. Takes vimpat 150mg  bid, klonopin disintegrating tablet PRN. Sees Dr Karel Jarvis.  In wheelchair.   LLQ cramping and vomiting, associated with chills.   No fevers, dysuria, urgency, frequency. No flank or back pain.  He makes urine on his own. No recent need for catheterization.  Feels staying well hydrated.  Remote h/o kidney stones No h/o kidney infection.   Denies alcohol or smoking.   Relevant past medical, surgical, family and social history reviewed and updated as indicated. Interim medical history since our last visit reviewed. Allergies and medications reviewed and updated. Outpatient Medications Prior to Visit  Medication Sig Dispense Refill  . aspirin EC 81 MG tablet Take 81 mg by mouth daily.    Marland Kitchen atorvastatin (LIPITOR) 80 MG tablet Take 1 tablet by mouth at bedtime for cholesterol. 90 tablet 3  . baclofen (LIORESAL) 20 MG tablet Take 1 tablet (20 mg  total) by mouth 3 (three) times daily.    . Cholecalciferol (VITAMIN D3) 5000 units CAPS Take 5,000 Units by mouth daily.    . clonazePAM (KLONOPIN) 0.5 MG disintegrating tablet Administer 1 tablet as needed for seizure. 10 tablet 5  . dantrolene (DANTRIUM) 100 MG capsule Take 100 mg by mouth 3 (three) times daily.    Marland Kitchen doxazosin (CARDURA) 1 MG tablet Take 1 tablet by mouth once daily for urinary retention. 90 tablet 3  . DULoxetine (CYMBALTA) 60 MG capsule Take 60 mg by mouth daily.    . Lacosamide 150 MG TABS Take 1 tablet twice a day 60 tablet 5  . Melatonin 3 MG TABS Take by mouth.    . metoprolol tartrate (LOPRESSOR) 25 MG tablet Take 25 mg by mouth 2 (two) times daily.    . baclofen (LIORESAL) 10 MG tablet Take 25 mg by mouth 3 (three) times daily.    . baclofen (LIORESAL) 10 MG tablet Take 1 tablet (10 mg total) by mouth 3 (three) times daily. 30 each   . Methylphenidate HCl ER, LA, (RITALIN LA) 60 MG CP24 Take 1 tablet by mouth daily. (Patient not taking: Reported on 08/09/2017) 30 capsule 0  . cephALEXin (KEFLEX) 500 MG capsule Take 1 capsule (500 mg total) by mouth 2 (two) times daily. 14 capsule 0   No  facility-administered medications prior to visit.      Per HPI unless specifically indicated in ROS section below Review of Systems     Objective:    BP 122/76 (BP Location: Left Arm, Patient Position: Sitting, Cuff Size: Normal)   Pulse (!) 113   Temp 98.2 F (36.8 C) (Oral)   Ht 5\' 8"  (1.727 m)   Wt 187 lb (84.8 kg)   SpO2 95%   BMI 28.43 kg/m   Wt Readings from Last 3 Encounters:  08/09/17 187 lb (84.8 kg)  05/15/17 175 lb (79.4 kg)  05/01/17 175 lb (79.4 kg)    Physical Exam  Constitutional: He appears well-developed and well-nourished. No distress.  HENT:  Mouth/Throat: Oropharynx is clear and moist. No oropharyngeal exudate.  R sided cranial deformity s/p craniotomy  Eyes: Pupils are equal, round, and reactive to light. EOM are normal.  Cardiovascular: Normal  rate, regular rhythm and normal heart sounds.  No murmur heard. Pulmonary/Chest: Effort normal and breath sounds normal. No respiratory distress. He has no wheezes. He has no rales.  Abdominal: Soft. Normal appearance and bowel sounds are normal. There is no hepatosplenomegaly. There is tenderness (mild) in the left lower quadrant. There is no rigidity, no rebound, no guarding, no CVA tenderness and negative Murphy's sign.  Musculoskeletal: He exhibits no edema.  Neurological:  Chronic L sided weakness - wears braces of LUE and LLE  Nursing note and vitals reviewed.  Results for orders placed or performed in visit on 08/09/17  POCT Urinalysis Dipstick (Automated)  Result Value Ref Range   Color, UA brown    Clarity, UA clear    Glucose, UA Negative Negative   Bilirubin, UA negative    Ketones, UA +/-    Spec Grav, UA 1.015 1.010 - 1.025   Blood, UA 3+    pH, UA 7.5 5.0 - 8.0   Protein, UA Positive (A) Negative   Urobilinogen, UA 0.2 0.2 or 1.0 E.U./dL   Nitrite, UA negative    Leukocytes, UA Small (1+) (A) Negative      Assessment & Plan:  Competency evaluation - did not address this today. Pt seemed competent on my exam today.  Problem List Items Addressed This Visit    Seizure disorder (HCC)    2 breakthrough seizures this past week despite compliance with vimpat 150mg  BID - advised he call neurologist for further evaluation.       Hemiparesis affecting left side as late effect of stroke (HCC)   Acute cystitis with hematuria - Primary    UA consistent with this. Send UCx. Rx keflex 500mg  bid x 1 wk course - has responded well to this in the past. Update if not improving with treatment.  Ddx nephrolithiasis - remote h/o same.  Given amt of blood, will recommend recheck UA in 3 wks to ensure full resolution of hematuria.       Relevant Orders   Urine Culture    Other Visit Diagnoses    Left lower quadrant pain       Relevant Orders   POCT Urinalysis Dipstick (Automated)  (Completed)       Meds ordered this encounter  Medications  . cephALEXin (KEFLEX) 500 MG capsule    Sig: Take 1 capsule (500 mg total) by mouth 2 (two) times daily.    Dispense:  14 capsule    Refill:  0   Orders Placed This Encounter  Procedures  . Urine Culture  . POCT Urinalysis Dipstick (Automated)  Follow up plan: No follow-ups on file.  Eustaquio Boyden, MD

## 2017-08-09 NOTE — Patient Instructions (Signed)
You do have urine infection - treat with keflex antibiotic for 1 week.  Push fluids and rest.  For recent seizure - schedule sooner appointment with Dr Karel JarvisAquino to see if we need to change medication.

## 2017-08-09 NOTE — Assessment & Plan Note (Signed)
2 breakthrough seizures this past week despite compliance with vimpat 150mg  BID - advised he call neurologist for further evaluation.

## 2017-08-09 NOTE — Assessment & Plan Note (Addendum)
UA consistent with this. Send UCx. Rx keflex 500mg  bid x 1 wk course - has responded well to this in the past. Update if not improving with treatment.  Ddx nephrolithiasis - remote h/o same.  Given amt of blood, will recommend recheck UA in 3 wks to ensure full resolution of hematuria.

## 2017-08-09 NOTE — Telephone Encounter (Signed)
PLEASE NOTE: All timestamps contained within this report are represented as [place] Standard Time. CONFIDENTIALTY NOTICE: This fax transmission is intended only for the addressee. It contains information that is legally privileged, confidential or otherwise protected from use or disclosure. If you are not the intended recipient, you are strictly prohibited from reviewing, disclosing, copying using or disseminating any of this information or taking any action in reliance on or regarding this information. If you have received this fax in error, please notify us immediately by telephone so that we can arrange for its return to us. Phone: [phone], Toll-Free: [phone], Fax: [phone] Page: 1 of 2 Call Id: [id number] [facility] - Client TELEPHONE ADVICE RECORD [facility] Patient Name: Pia MauCHRISTOPHER Gilardi Gender: Male DOB: 08/15/1970 Age: 47 Y 7 M 18 D Return Phone Number: [phone] (Primary), [phone] (Secondary) Address: [place]: [place] [id number] Client [facility] - Client [facility] - Night Physician Vernona Riegerlark, Katherine - NP Contact Type Call Who Is Calling Patient / Member / Family / Caregiver Call Type Triage / Clinical Caller Name Willette PaJulie Brown POA Relationship To Patient Sibling Return Phone Number [phone] (Primary) Chief Complaint Prescription Refill or Medication Request (non symptomatic) Reason for Call Symptomatic / Request for Health Information Initial Comment Caller states her brother is having uti symptoms, and wants to talk to a nurse about treatment or getting a prescription Translation No Nurse Assessment Nurse: Fransisco HertzKirkland, RN, Elnita Maxwellheryl Date/Time (Eastern Time): 08/09/2017 12:45:10 PM Confirm and document reason for call. If symptomatic, describe symptoms. ---Caller states her brother is having UTI symptoms, and wants to talk to a nurse  about treatment or getting a prescription. He has issues with UTI. He had a massive stroke in 2017 with seizures, cognitive issues, frequent UTI's Does the patient have any new or worsening symptoms? ---Yes Will a triage be completed? ---Yes Related visit to physician within the last 2 weeks? ---No Does the PT have any chronic conditions? (i.e. diabetes, asthma, etc.) ---Yes List chronic conditions. ---massive stroke in 2017 with seizures, cognitive issues, frequent UTI's Is this a behavioral health or substance abuse call? ---No Guidelines Guideline Title Affirmed Question Affirmed Notes Nurse Date/Time (Eastern Time) Urination Pain - Male All other males with painful urination Wendie ChessKirkland, RN, Cheryl 08/09/2017 12:50:06 PM Disp. Time Lamount Cohen(Eastern Time) Disposition Final User 08/09/2017 1:03:05 PM See PCP within 24 Hours Yes Fransisco HertzKirkland, RN, Elnita Maxwellheryl PLEASE NOTE: All timestamps contained within this report are represented as [place] Standard Time. CONFIDENTIALTY NOTICE: This fax transmission is intended only for the addressee. It contains information that is legally privileged, confidential or otherwise protected from use or disclosure. If you are not the intended recipient, you are strictly prohibited from reviewing, disclosing, copying using or disseminating any of this information or taking any action in reliance on or regarding this information. If you have received this fax in error, please notify us immediately by telephone so that we can arrange for its return to us. Phone: [phone], Toll-Free: [phone], Fax: [phone] Page: 2 of 2 Call Id: [id number] Caller Disagree/Comply Comply Caller Understands Yes PreDisposition Call Doctor Care Advice Given Per Guideline SEE PCP WITHIN 24 HOURS: FLUIDS: Drink extra fluids (Reason: to produce dilute, non-irritating urine). CALL BACK IF: * You become worse. * Side (flank) or lower back pain occurs * Fever over 100.5 F (38.1 C)  occurs Comments User: Caryn Beeheryl, Kirkland, RN Date/Time (Eastern Time): 08/09/2017 1:02:21 PM Warm transferred to office. They will  need to speak with the NP and then call the patient back. Patient is having urgency, frequency, burning, and vomiting. No fever. Please call sister Willette Pa, [place] back at [phone] NP had told sister previously that she will probably want to do a cath urine on him the next time he has a UTI. Referrals REFERRED TO PCP OFFICE

## 2017-08-10 LAB — URINE CULTURE
MICRO NUMBER: 90846833
SPECIMEN QUALITY: ADEQUATE

## 2017-08-17 ENCOUNTER — Telehealth: Payer: Self-pay

## 2017-08-17 NOTE — Telephone Encounter (Signed)
Copied from CRM [id number]. Topic: Appointment Scheduling - Scheduling Inquiry for Clinic >> Aug 17, 2017  3:55 PM Oneal GroutSebastian, Jennifer S wrote: Reason for CRM: Would like to know if [name] would do eval to see if patient is competent for signing legal papers

## 2017-08-17 NOTE — Telephone Encounter (Signed)
Pt established care on 05/15/17.Please advise.

## 2017-08-18 NOTE — Telephone Encounter (Signed)
Yes, of course. Please schedule him at his convenience.

## 2017-08-22 ENCOUNTER — Encounter: Payer: Self-pay | Admitting: Neurology

## 2017-08-22 ENCOUNTER — Encounter: Payer: Self-pay | Admitting: Family Medicine

## 2017-08-22 ENCOUNTER — Ambulatory Visit (INDEPENDENT_AMBULATORY_CARE_PROVIDER_SITE_OTHER): Payer: 59 | Admitting: Family Medicine

## 2017-08-22 VITALS — BP 120/84 | HR 65 | Temp 98.0°F | Ht 68.0 in | Wt 187.2 lb

## 2017-08-22 DIAGNOSIS — Z87442 Personal history of urinary calculi: Secondary | ICD-10-CM

## 2017-08-22 DIAGNOSIS — G40909 Epilepsy, unspecified, not intractable, without status epilepticus: Secondary | ICD-10-CM

## 2017-08-22 DIAGNOSIS — R109 Unspecified abdominal pain: Secondary | ICD-10-CM | POA: Diagnosis not present

## 2017-08-22 DIAGNOSIS — R1031 Right lower quadrant pain: Secondary | ICD-10-CM | POA: Diagnosis not present

## 2017-08-22 DIAGNOSIS — I69354 Hemiplegia and hemiparesis following cerebral infarction affecting left non-dominant side: Secondary | ICD-10-CM

## 2017-08-22 DIAGNOSIS — N200 Calculus of kidney: Secondary | ICD-10-CM | POA: Diagnosis not present

## 2017-08-22 HISTORY — DX: Personal history of urinary calculi: Z87.442

## 2017-08-22 LAB — POC URINALSYSI DIPSTICK (AUTOMATED)
Bilirubin, UA: NEGATIVE
GLUCOSE UA: NEGATIVE
Ketones, UA: NEGATIVE
NITRITE UA: NEGATIVE
PROTEIN UA: POSITIVE — AB
Spec Grav, UA: 1.015 (ref 1.010–1.025)
UROBILINOGEN UA: 0.2 U/dL
pH, UA: 7 (ref 5.0–8.0)

## 2017-08-22 MED ORDER — TAMSULOSIN HCL 0.4 MG PO CAPS: 0.4000 mg | ORAL_CAPSULE | Freq: Every day | ORAL | 0 refills | Status: DC

## 2017-08-22 NOTE — Patient Instructions (Addendum)
I do think this is recurrent kidney stone.  We will order ct scan for full evaluation for kidney stones. See our referral coordinators today.  Start straining urine - strainer provided Start flomax medicine to help pass stone easier.  I will send off stone for testing.  May take ibuprofen 400-600mg  with meals for discomfort as needed, let us know if ongoing pain despite this.   Kidney Stones Kidney stones (urolithiasis) are solid, rock-like deposits that form inside of the organs that make urine (kidneys). A kidney stone may form in a kidney and move into the bladder, where it can cause intense pain and block the flow of urine. Kidney stones are created when high levels of certain minerals are found in the urine. They are usually passed through urination, but in some cases, medical treatment may be needed to remove them. What are the causes? Kidney stones may be caused by:  A condition in which certain glands produce too much parathyroid hormone (primary hyperparathyroidism), which causes too much calcium buildup in the blood.  Buildup of uric acid crystals in the bladder (hyperuricosuria). Uric acid is a chemical that the body produces when you eat certain foods. It usually exits the body in the urine.  Narrowing (stricture) of one or both of the tubes that drain urine from the kidneys to the bladder (ureters).  A kidney blockage that is present at birth (congenital obstruction).  Past surgery on the kidney or the ureters, such as gastric bypass surgery.  What increases the risk? The following factors make you more likely to develop kidney stones:  Having had a kidney stone in the past.  Having a family history of kidney stones.  Not drinking enough water.  Eating a diet that is high in protein, salt (sodium), or sugar.  Being overweight or obese.  What are the signs or symptoms? Symptoms of a kidney stone may include:  Nausea.  Vomiting.  Blood in the urine  (hematuria).  Pain in the side of the abdomen, right below the ribs (flank pain). Pain usually spreads (radiates) to the groin.  Needing to urinate frequently or urgently.  How is this diagnosed? This condition may be diagnosed based on:  Your medical history.  A physical exam.  Blood tests.  Urine tests.  CT scan.  Abdominal X-ray.  A procedure to examine the inside of the bladder (cystoscopy).  How is this treated? Treatment for kidney stones depends on the size, location, and makeup of the stones. Treatment may involve:  Analyzing your urine before and after you pass the stone through urination.  Being monitored at the hospital until you pass the stone through urination.  Increasing your fluid intake and decreasing the amount of calcium and protein in your diet.  A procedure to break up kidney stones in the bladder using: ? A focused beam of light (laser therapy). ? Shock waves (extracorporeal shock wave lithotripsy).  Surgery to remove kidney stones. This may be needed if you have severe pain or have stones that block your urinary tract.  Follow these instructions at home: Eating and drinking   Drink enough fluid to keep your urine clear or pale yellow. This will help you to pass the kidney stone.  If directed, change your diet. This may include: ? Limiting how much sodium you eat. ? Eating more fruits and vegetables. ? Limiting how much meat, poultry, fish, and eggs you eat.  Follow instructions from your health care provider about eating or drinking restrictions. General  instructions  Collect urine samples as told by your health care provider. You may need to collect a urine sample: ? 24 hours after you pass the stone. ? 8-12 weeks after passing the kidney stone, and every 6-12 months after that.  Strain your urine every time you urinate, for as long as directed. Use the strainer that your health care provider recommends.  Do not throw out the kidney  stone after passing it. Keep the stone so it can be tested by your health care provider. Testing the makeup of your kidney stone may help prevent you from getting kidney stones in the future.  Take over-the-counter and prescription medicines only as told by your health care provider.  Keep all follow-up visits as told by your health care provider. This is important. You may need follow-up X-rays or ultrasounds to make sure that your stone has passed. How is this prevented? To prevent another kidney stone:  Drink enough fluid to keep your urine clear or pale yellow. This is the best way to prevent kidney stones.  Eat a healthy diet and follow recommendations from your health care provider about foods to avoid. You may be instructed to eat a low-protein diet. Recommendations vary depending on the type of kidney stone that you have.  Maintain a healthy weight.  Contact a health care provider if:  You have pain that gets worse or does not get better with medicine. Get help right away if:  You have a fever or chills.  You develop severe pain.  You develop new abdominal pain.  You faint.  You are unable to urinate. This information is not intended to replace advice given to you by your health care provider. Make sure you discuss any questions you have with your health care provider. Document Released: 01/10/2005 Document Revised: 07/31/2015 Document Reviewed: 06/26/2015 Elsevier Interactive Patient Education  [employer].

## 2017-08-22 NOTE — Telephone Encounter (Signed)
Patient had schedule appt on 08/30/2017

## 2017-08-22 NOTE — Progress Notes (Signed)
BP 120/84 (BP Location: Right Arm, Patient Position: Sitting, Cuff Size: Normal)    Pulse 65    Temp 98 F (36.7 C) (Oral)    Ht 5\' 8"  (1.727 m)    Wt 187 lb 4 oz (84.9 kg)    SpO2 97%    BMI 28.47 kg/m    CC: recurrent hematuria Subjective:    Patient ID: Patrick Franklin, male    DOB: March 23, 1970, 47 y.o.   MRN: [id number]  HPI: Patrick Franklin is a 47 y.o. male presenting on 08/22/2017 for Hematuria (Noticed blood in urine on 08/19/17. Pt passed kidney stone on 08/13/17. ) and Flank Pain (C/o right flank pain since this morning. )   Here with sister today.  See prior note for details. Seen here 7/17 with LLQ cramping and vomiting associated with chills. UA showed 3+ hematuria and 1+ LE, UCx 3+ organisms present may represent normal flora contamination. Passed large kidney stone 08/13/2017 - and brings it in today - it will be sent for stone analysis. Symptoms did improve after kidney stone passed.   However, Saturday morning noted blood in urine and today noted RLQ and R flank pain described as runner's cramp. + urgency. Denies dysuria, frequency, fevers, nausea. Feels he's staying well hydrated. Drinking at least 32 oz/day - cranberry/grape and ginger ale. PRN tylenol is controlling pain.   Remote h/o kidney stones ~2009.   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 In wheelchair.  H/o seizures after stroke - ongoing seizures and last visit I asked him to contact neurology about this. On vimpat 150mg  bid, klonopin disintegrating tablet PRN.   Relevant past medical, surgical, family and social history reviewed and updated as indicated. Interim medical history since our last visit reviewed. Allergies and medications reviewed and updated. Outpatient Medications Prior to Visit  Medication Sig Dispense Refill   aspirin EC 81 MG tablet Take 81 mg by mouth daily.     atorvastatin (LIPITOR) 80 MG tablet Take 1 tablet by mouth at bedtime for cholesterol. 90 tablet 3   baclofen (LIORESAL)  20 MG tablet Take 1 tablet (20 mg total) by mouth 3 (three) times daily.     Cholecalciferol (VITAMIN D3) 5000 units CAPS Take 5,000 Units by mouth daily.     clonazePAM (KLONOPIN) 0.5 MG disintegrating tablet Administer 1 tablet as needed for seizure. 10 tablet 5   dantrolene (DANTRIUM) 100 MG capsule Take 100 mg by mouth 3 (three) times daily.     doxazosin (CARDURA) 1 MG tablet Take 1 tablet by mouth once daily for urinary retention. 90 tablet 3   DULoxetine (CYMBALTA) 60 MG capsule Take 60 mg by mouth daily.     Lacosamide 150 MG TABS Take 1 tablet twice a day 60 tablet 5   Melatonin 3 MG TABS Take by mouth.     metoprolol tartrate (LOPRESSOR) 25 MG tablet Take 25 mg by mouth 2 (two) times daily.     Methylphenidate HCl ER, LA, (RITALIN LA) 60 MG CP24 Take 1 tablet by mouth daily. (Patient not taking: Reported on 08/22/2017) 30 capsule 0   cephALEXin (KEFLEX) 500 MG capsule Take 1 capsule (500 mg total) by mouth 2 (two) times daily. 14 capsule 0   No facility-administered medications prior to visit.      Per HPI unless specifically indicated in ROS section below Review of Systems     Objective:    BP 120/84 (BP Location: Right Arm, Patient Position: Sitting, Cuff Size:  Normal)    Pulse 65    Temp 98 F (36.7 C) (Oral)    Ht 5\' 8"  (1.727 m)    Wt 187 lb 4 oz (84.9 kg)    SpO2 97%    BMI 28.47 kg/m   Wt Readings from Last 3 Encounters:  08/22/17 187 lb 4 oz (84.9 kg)  08/09/17 187 lb (84.8 kg)  05/15/17 175 lb (79.4 kg)    Physical Exam  Constitutional: He appears well-developed and well-nourished. No distress.  In wheelchair  Residual L hemiparesis  HENT:  Mouth/Throat: Oropharynx is clear and moist. No oropharyngeal exudate.  Eyes: Pupils are equal, round, and reactive to light. EOM are normal.  Cardiovascular: Normal rate, regular rhythm and normal heart sounds.  No murmur heard. Pulmonary/Chest: Effort normal and breath sounds normal. No respiratory distress. He  has no wheezes. He has no rales.  Musculoskeletal: He exhibits no edema.  Psychiatric: He has a normal mood and affect.  Nursing note and vitals reviewed.  Results for orders placed or performed in visit on 08/22/17  POCT Urinalysis Dipstick (Automated)  Result Value Ref Range   Color, UA brown    Clarity, UA cloudy    Glucose, UA Negative Negative   Bilirubin, UA negative    Ketones, UA negative    Spec Grav, UA 1.015 1.010 - 1.025   Blood, UA 3+    pH, UA 7.0 5.0 - 8.0   Protein, UA Positive (A) Negative   Urobilinogen, UA 0.2 0.2 or 1.0 E.U./dL   Nitrite, UA negative    Leukocytes, UA Small (1+) (A) Negative      Assessment & Plan:   Problem List Items Addressed This Visit    Seizure disorder (HCC)    No seizures in last 2 wks. Has not had chance to contact neurology yet. Continues vimpat.       Right lower quadrant abdominal pain    Anticipate symptoms due to recurrent kidney stone after he recently passed one last week. Will treat with flomax, provided with strainer. Red flags to seek further care reviewed. Not consistent with infection. Discussed [facility] vs CT - will order CT abd/pelvis. Consider urology referral if not improving with above treatment.       Relevant Orders   CT Abdomen Pelvis Wo Contrast   Kidney stone    Brings in stone passed last week - will send for stone analysis.      Relevant Orders   CT Abdomen Pelvis Wo Contrast   Stone analysis   Hemiparesis affecting left side as late effect of stroke (HCC)    Other Visit Diagnoses    Right flank pain    -  Primary   Relevant Orders   POCT Urinalysis Dipstick (Automated) (Completed)   CT Abdomen Pelvis Wo Contrast       Meds ordered this encounter  Medications   tamsulosin (FLOMAX) 0.4 MG CAPS capsule    Sig: Take 1 capsule (0.4 mg total) by mouth daily.    Dispense:  10 capsule    Refill:  0   Orders Placed This Encounter  Procedures   Stone analysis   CT Abdomen Pelvis Wo Contrast     Standing Status:   Future    Standing Expiration Date:   11/23/2018    Order Specific Question:   ** REASON FOR EXAM (FREE TEXT)    Answer:   RLQ and R flank pain, hematuria protocol    Order Specific Question:   Preferred  imaging location?    Answer:   [facility]    Order Specific Question:   Is Oral Contrast requested for this exam?    Answer:   Yes, Per Radiology protocol    Order Specific Question:   Radiology Contrast Protocol - do NOT remove file path    Answer:   \[email or web address]   POCT Urinalysis Dipstick (Automated)    Follow up plan: No follow-ups on file.  Eustaquio Boyden, MD

## 2017-08-22 NOTE — Assessment & Plan Note (Signed)
Brings in stone passed last week - will send for stone analysis.

## 2017-08-22 NOTE — Assessment & Plan Note (Signed)
Anticipate symptoms due to recurrent kidney stone after he recently passed one last week. Will treat with flomax, provided with strainer. Red flags to seek further care reviewed. Not consistent with infection. Discussed [facility] vs CT - will order CT abd/pelvis. Consider urology referral if not improving with above treatment.

## 2017-08-22 NOTE — Assessment & Plan Note (Signed)
No seizures in last 2 wks. Has not had chance to contact neurology yet. Continues vimpat.

## 2017-08-23 ENCOUNTER — Other Ambulatory Visit: Payer: Self-pay | Admitting: Neurology

## 2017-08-23 MED ORDER — LACOSAMIDE 200 MG PO TABS: 200.0000 mg | ORAL_TABLET | Freq: Two times a day (BID) | ORAL | 5 refills | Status: DC

## 2017-08-27 LAB — STONE ANALYSIS: STONE WEIGHT: 0.031 g

## 2017-08-28 ENCOUNTER — Ambulatory Visit
Admission: RE | Admit: 2017-08-28 | Discharge: 2017-08-28 | Disposition: A | Discharge status: Home / Self Care | Payer: 59 | Source: Ambulatory Visit | Attending: Family Medicine | Admitting: Family Medicine

## 2017-08-28 DIAGNOSIS — Z982 Presence of cerebrospinal fluid drainage device: Secondary | ICD-10-CM | POA: Diagnosis not present

## 2017-08-28 DIAGNOSIS — K769 Liver disease, unspecified: Secondary | ICD-10-CM | POA: Insufficient documentation

## 2017-08-28 DIAGNOSIS — R918 Other nonspecific abnormal finding of lung field: Secondary | ICD-10-CM | POA: Insufficient documentation

## 2017-08-28 DIAGNOSIS — R1031 Right lower quadrant pain: Secondary | ICD-10-CM | POA: Diagnosis not present

## 2017-08-28 DIAGNOSIS — N200 Calculus of kidney: Secondary | ICD-10-CM

## 2017-08-28 DIAGNOSIS — I700 Atherosclerosis of aorta: Secondary | ICD-10-CM | POA: Insufficient documentation

## 2017-08-28 DIAGNOSIS — R109 Unspecified abdominal pain: Secondary | ICD-10-CM

## 2017-08-29 ENCOUNTER — Encounter: Payer: Self-pay | Admitting: Family Medicine

## 2017-08-29 ENCOUNTER — Other Ambulatory Visit: Payer: Self-pay | Admitting: Family Medicine

## 2017-08-29 ENCOUNTER — Telehealth: Payer: Self-pay

## 2017-08-29 DIAGNOSIS — I708 Atherosclerosis of other arteries: Secondary | ICD-10-CM

## 2017-08-29 DIAGNOSIS — N200 Calculus of kidney: Secondary | ICD-10-CM

## 2017-08-29 DIAGNOSIS — I700 Atherosclerosis of aorta: Secondary | ICD-10-CM | POA: Insufficient documentation

## 2017-08-29 HISTORY — DX: Atherosclerosis of other arteries: I70.8

## 2017-08-29 HISTORY — DX: Atherosclerosis of aorta: I70.8

## 2017-08-29 HISTORY — DX: Atherosclerosis of aorta: I70.0

## 2017-08-29 NOTE — Telephone Encounter (Signed)
Spoke with Raynelle FanningJulie, patient's sister (ok per DPR), advised of kidney stone analysis and the result. Raynelle FanningJulie wanted to know what could be causing this type of stone based on the analysis. Diet? Medications he takes? What can patient do to help prevent these if anything? Please review. Thank you. Julie's CB [phone] Maddocks Ander PurpuraV Haru Anspaugh, RMA

## 2017-08-30 ENCOUNTER — Encounter: Payer: Self-pay | Admitting: Primary Care

## 2017-08-30 ENCOUNTER — Ambulatory Visit (INDEPENDENT_AMBULATORY_CARE_PROVIDER_SITE_OTHER): Payer: 59 | Admitting: Primary Care

## 2017-08-30 ENCOUNTER — Encounter: Payer: Self-pay | Admitting: Psychology

## 2017-08-30 VITALS — BP 122/84 | HR 69 | Temp 98.2°F

## 2017-08-30 DIAGNOSIS — N200 Calculus of kidney: Secondary | ICD-10-CM | POA: Diagnosis not present

## 2017-08-30 DIAGNOSIS — Z8673 Personal history of transient ischemic attack (TIA), and cerebral infarction without residual deficits: Secondary | ICD-10-CM

## 2017-08-30 DIAGNOSIS — Z789 Other specified health status: Secondary | ICD-10-CM

## 2017-08-30 NOTE — Patient Instructions (Signed)
Stop by the front desk and speak with either Shirlee LimerickMarion regarding your referral to Neuropsychology and Urology.  Continue Flomax for kidney stones. Continue to strain your urine for kidney stones.   It was a pleasure to see you today!

## 2017-08-30 NOTE — Telephone Encounter (Signed)
First step is increased fluid intake.  Also, increasing dietary calcium, potassium, and phytate (in plant seeds) may be beneficial. Decreasing the intake of oxalate, animal protein, sucrose, fructose, and sodium may decrease risk of stone formation. Avoid supplemental vitamin C and supplemental calcium (as opposed to dietary calcium). Also get recommendations from urologist.

## 2017-08-30 NOTE — Telephone Encounter (Addendum)
Spoke with pt's sister, Raynelle FanningJulie, relaying Dr. Timoteo ExposeG's message.  Verbalizes understanding and states pt has an appt with urology Fri, 09/01/17.

## 2017-08-30 NOTE — Assessment & Plan Note (Signed)
Family believes he is not of sound mind to make complex decisions since CVA in 2017. MMSE score of 30 today. He is alert and oriented and seems competent on a basic level.  Referral placed to neuropsychology for further testing.

## 2017-08-30 NOTE — Progress Notes (Signed)
Subjective:    Patient ID: Patrick Franklin, male    DOB: [birth year], 47 y.o.   MRN: [id number]  HPI  Patrick Franklin is a 47 year old male with a history of renal stones, UTI, hemiparesis affecting left side from MCA stroke in December 2017, urinary retention who presents today to discuss CT scan results and current legal issue.   He continues to experience right flank pain which has overall improved. He denies dysuria, hematuria, urinary frequency. He passed 3 additional stones since Friday last week.  He has an appointment with a Urologist soon.   His family is here today to discuss whether he is of sound mind to make complex decisions. His family is concerned about his critical thinking skills with documents, specifically with an incident that occurred recently.  He has recently signed separation papers that have left him with a lot of debt and little finances.   He underwent a neuropsychologist evaluation after his stroke. His family has noticed that he has a tough time "connecting the dots" with decision he makes now and how it will affect his future. They don't believe he was able to comprehend the documents that he was presented so he "just signed them".   He and his family are requesting testing to see if he was/is of sound mind when signing complex documents.  Review of Systems  Constitutional: Negative for fever.  Gastrointestinal: Negative for abdominal pain.  Genitourinary: Positive for flank pain. Negative for dysuria and hematuria.  Neurological: Negative for dizziness, syncope and headaches.  Psychiatric/Behavioral: Negative for confusion.       Past Medical History:  Diagnosis Date   Asthma    Cardiomegaly 04/13/2016   Depression    DVT (deep venous thrombosis) (HCC)    GERD (gastroesophageal reflux disease)    Hemiplegia and hemiparesis (HCC)    Left side   Hypertension    Neurogenic bladder    Neurogenic bowel    Neuropathy    Pulmonary embolism (HCC)  01/13/2016   Seizures (HCC)    Stroke (HCC) 12/31/2015   left side deficits    Subluxation of shoulder joint 04/13/2016   left   UTI (urinary tract infection)      Social History   Socioeconomic History   Marital status: Married    Spouse name: Not on file   Number of children: Not on file   Years of education: Not on file   Highest education level: Not on file  Occupational History   Not on file  Social Needs   Financial resource strain: Not on file   Food insecurity:    Worry: Not on file    Inability: Not on file   Transportation needs:    Medical: Not on file    Non-medical: Not on file  Tobacco Use   Smoking status: Never Smoker   Smokeless tobacco: Never Used  Substance and Sexual Activity   Alcohol use: Not Currently    Frequency: Never   Drug use: Never   Sexual activity: Not on file  Lifestyle   Physical activity:    Days per week: Not on file    Minutes per session: Not on file   Stress: Not on file  Relationships   Social connections:    Talks on phone: Not on file    Gets together: Not on file    Attends religious service: Not on file    Active member of club or organization: Not on file  Attends meetings of clubs or organizations: Not on file    Relationship status: Not on file   Intimate partner violence:    Fear of current or ex partner: Not on file    Emotionally abused: Not on file    Physically abused: Not on file    Forced sexual activity: Not on file  Other Topics Concern   Not on file  Social History Narrative   Single.   4 children.    Once worked as an Teacher, adult educationestimator.    Enjoys reading.     Past Surgical History:  Procedure Laterality Date   CRANIECTOMY  2017   CRANIOPLASTY  2018   IVC FILTER INSERTION  2017   PEG TUBE PLACEMENT  2017   VENTRICULOPERITONEAL SHUNT  2018    Family History  Problem Relation Age of Onset   Stroke Maternal Uncle    Stroke Maternal Uncle     Allergies  Allergen  Reactions   Versed [Midazolam]     Current Outpatient Medications on File Prior to Visit  Medication Sig Dispense Refill   aspirin EC 81 MG tablet Take 81 mg by mouth daily.     atorvastatin (LIPITOR) 80 MG tablet Take 1 tablet by mouth at bedtime for cholesterol. 90 tablet 3   baclofen (LIORESAL) 20 MG tablet Take 1 tablet (20 mg total) by mouth 3 (three) times daily.     Cholecalciferol (VITAMIN D3) 5000 units CAPS Take 5,000 Units by mouth daily.     clonazePAM (KLONOPIN) 0.5 MG disintegrating tablet Administer 1 tablet as needed for seizure. 10 tablet 5   dantrolene (DANTRIUM) 100 MG capsule Take 100 mg by mouth 3 (three) times daily.     doxazosin (CARDURA) 1 MG tablet Take 1 tablet by mouth once daily for urinary retention. 90 tablet 3   DULoxetine (CYMBALTA) 60 MG capsule Take 60 mg by mouth daily.     lacosamide (VIMPAT) 200 MG TABS tablet Take 1 tablet (200 mg total) by mouth 2 (two) times daily. 60 tablet 5   Melatonin 3 MG TABS Take by mouth.     Methylphenidate HCl ER, LA, (RITALIN LA) 60 MG CP24 Take 1 tablet by mouth daily. 30 capsule 0   metoprolol tartrate (LOPRESSOR) 25 MG tablet Take 25 mg by mouth 2 (two) times daily.     tamsulosin (FLOMAX) 0.4 MG CAPS capsule Take 1 capsule (0.4 mg total) by mouth daily. 10 capsule 0   No current facility-administered medications on file prior to visit.     BP 122/84    Pulse 69    Temp 98.2 F (36.8 C) (Oral)    SpO2 94%    Objective:   Physical Exam  Constitutional: He is oriented to person, place, and time. He appears well-nourished.  Neck: Neck supple.  Cardiovascular: Normal rate and regular rhythm.  Respiratory: Effort normal and breath sounds normal.  GI: There is no tenderness. There is no CVA tenderness.  Neurological: He is alert and oriented to person, place, and time.  MMSE score of 30 today.  Skin: Skin is warm and dry.  Psychiatric: He has a normal mood and affect.           Assessment &  Plan:

## 2017-08-30 NOTE — Assessment & Plan Note (Signed)
Will be seeing Urology soon. Has passed three additional stones since last Friday.

## 2017-09-01 ENCOUNTER — Encounter: Payer: Self-pay | Admitting: Urology

## 2017-09-01 ENCOUNTER — Ambulatory Visit (INDEPENDENT_AMBULATORY_CARE_PROVIDER_SITE_OTHER): Payer: 59 | Admitting: Urology

## 2017-09-01 VITALS — BP 120/84 | HR 90 | Ht 68.0 in | Wt 187.0 lb

## 2017-09-01 DIAGNOSIS — R339 Retention of urine, unspecified: Secondary | ICD-10-CM

## 2017-09-01 DIAGNOSIS — N200 Calculus of kidney: Secondary | ICD-10-CM | POA: Diagnosis not present

## 2017-09-01 LAB — URINALYSIS, COMPLETE
Bilirubin, UA: NEGATIVE
Glucose, UA: NEGATIVE
Ketones, UA: NEGATIVE
LEUKOCYTES UA: NEGATIVE
Nitrite, UA: NEGATIVE
PH UA: 6 (ref 5.0–7.5)
PROTEIN UA: NEGATIVE
RBC, UA: NEGATIVE
Specific Gravity, UA: 1.025 (ref 1.005–1.030)
Urobilinogen, Ur: 0.2 mg/dL (ref 0.2–1.0)

## 2017-09-01 LAB — MICROSCOPIC EXAMINATION
Epithelial Cells (non renal): NONE SEEN /hpf (ref 0–10)
WBC, UA: NONE SEEN /hpf (ref 0–5)

## 2017-09-01 NOTE — Progress Notes (Addendum)
09/01/2017 3:09 PM   Patrick Franklin July 06, 1970 [id number]  Referring provider: Doreene Nest, NP [address] [place], [place] [address]  CC: Left nephrolithiasis  HPI: Patrick Franklin is a 47 year old comorbid male here for evaluation of left-sided nephrolithiasis.  Briefly he has numerous comorbidities including a history of a stroke, DVT, IVC filter, VP shunt, and seizure disorder. His bladder is managed by spontaneously voiding.  He reports passage of numerous small stones over the last few weeks.  Stone analysis showed calcium oxalate stones.  He denies any prior surgeries for urolithiasis.  He denies recurrent hematuria, any flank pain, or fevers/chills at this time. He is here today with his brother in law.   PMH: Past Medical History:  Diagnosis Date   Asthma    Cardiomegaly 04/13/2016   Depression    DVT (deep venous thrombosis) (HCC)    GERD (gastroesophageal reflux disease)    Hemiplegia and hemiparesis (HCC)    Left side   Hypertension    Neurogenic bladder    Neurogenic bowel    Neuropathy    Pulmonary embolism (HCC) 01/13/2016   Seizures (HCC)    Stroke (HCC) 12/31/2015   left side deficits    Subluxation of shoulder joint 04/13/2016   left   UTI (urinary tract infection)     Surgical History: Past Surgical History:  Procedure Laterality Date   CRANIECTOMY  2017   CRANIOPLASTY  2018   IVC FILTER INSERTION  2017   PEG TUBE PLACEMENT  2017   VENTRICULOPERITONEAL SHUNT  2018    Allergies:  Allergies  Allergen Reactions   Versed [Midazolam]     Family History: Family History  Problem Relation Age of Onset   Stroke Maternal Uncle    Stroke Maternal Uncle     Social History:  reports that he has never smoked. He has never used smokeless tobacco. He reports that he drank alcohol. He reports that he does not use drugs.  ROS: Please see flowsheet from today's date for complete review of systems.  Physical Exam: BP  120/84    Pulse 90    Ht 5\' 8"  (1.727 m)    Wt 187 lb (84.8 kg)    BMI 28.43 kg/m   Constitutional:  Wheelchair bound, conversant Cardiovascular: No clubbing, cyanosis, or edema. Respiratory: Normal respiratory effort, no increased work of breathing. GI: Abdomen is soft, nontender, nondistended Skin: No rashes, bruises or suspicious lesions. Psychiatric: Slightly delayed affect  Laboratory Data: Lab Results  Component Value Date   WBC 6.9 04/23/2017   HGB 14.3 04/23/2017   HCT 45.5 04/23/2017   MCV 87.5 04/23/2017   PLT 173 04/23/2017    Lab Results  Component Value Date   CREATININE 0.67 05/15/2017    Urinalysis    Component Value Date/Time   BILIRUBINUR negative 08/22/2017 1157   PROTEINUR Positive (A) 08/22/2017 1157   UROBILINOGEN 0.2 08/22/2017 1157   NITRITE negative 08/22/2017 1157   LEUKOCYTESUR Small (1+) (A) 08/22/2017 1157   Pertinent Imaging:  I have personally reviewed the CT renal stone dated 08/28/2017 notable for no hydronephrosis, and numerous <75mm left renal stones.  Assessment & Plan:   In summary Patrick Franklin is a 47 year old comorbid male with a history of spontaneously passing numerous kidney stones.  He recent collected a stone this summer which was calcium oxalate.  CT scan 08/28/2017 shows numerous residual left-sided renal stones, all smaller than 4 to 5 mm.  We had a long discussion about  management options for his left-sided nephrolithiasis including observation, ureteroscopy and laser lithotripsy, shockwave lithotripsy, and PCNL.  With his comorbidities, and the fact that he is spontaneously passed all of his stones, I recommended ongoing observation of these small asymptomatic left-sided stones.  Additionally I ordered a 24-hour urine test to identify the etiology of his stones.  We also discussed general stone prevention strategies including increased fluid intake with goal of 2.5 L of urine output per day, as well as minimizing salt and red meat in the  diet.  Follow up: -24 hour urine ordered, will call to review results since transport can be challenging for them -1 year follow up with [facility] to monitor renal stone burden  ADDENDUM: I discussed 24 hr urine results over the phone with patient's sister, who he lives with. Main finding is low urine volume of 1.07L. We discussed goal of 2.5L UOP/day and pushing fluids. Also consider adding calcium to high oxalate meals.   Sondra ComeBrian C Dimitriy Carreras, MD  [facility] [address], [address] [place], [place] [address] [phone]

## 2017-09-22 ENCOUNTER — Ambulatory Visit: Payer: Self-pay | Admitting: *Deleted

## 2017-09-22 NOTE — Telephone Encounter (Signed)
Could also check in with his urologist---but they should be able to do bladder scan &/or cath him [facility]

## 2017-09-22 NOTE — Telephone Encounter (Signed)
Did patient go to Urgent care? Did he call his Urologist? He needs to always call his Urologist for these symptoms.

## 2017-09-22 NOTE — Telephone Encounter (Signed)
[name] sister called with her brother not being able to urinate. He last voided yesterday late morning. He has a hx of having a stroke. He is not complaining of pain. But when she palpated his bladder he did. She has been pushing fluids on him, trying to get him to void.  At his last voiding, did not notice any blood in the urine. Denies fever. Does have a hx of kidney stones and sees a urologist.  She will take him to the Ur[facility] to be catheterized. Will notify flow at [facility].  Reason for Disposition  [1] Unable to urinate (or only a few drops) > 4 hours AND     [2] bladder feels very full (e.g., palpable bladder or strong urge to urinate)  Answer Assessment - Initial Assessment Questions 1. SYMPTOM: "What's the main symptom you're concerned about?" (e.g., frequency, incontinence)     Unable to urinate 2. ONSET: "When did the inability to urinate  start?"     yesterday 3. PAIN: "Is there any pain?" If so, ask: "How bad is it?" (Scale: 1-10; mild, moderate, severe)     No pain 4. CAUSE: "What do you think is causing the symptoms?"     Possibly kidney stone 5. OTHER SYMPTOMS: "Do you have any other symptoms?" (e.g., fever, flank pain, blood in urine, pain with urination)     no  Protocols used: URINARY [id number]

## 2017-09-26 NOTE — Telephone Encounter (Signed)
Message left for patient to return my call.

## 2017-09-29 NOTE — Telephone Encounter (Signed)
Noted.  °

## 2017-09-29 NOTE — Telephone Encounter (Signed)
Spoken to patient's sister on 09/27/2017. She started that patient did not go to urgent care. He was able to go later that day. She stated that next time they will contact the urologist if these symptoms happen again.

## 2017-10-16 ENCOUNTER — Other Ambulatory Visit: Payer: Self-pay | Admitting: Urology

## 2017-10-24 ENCOUNTER — Telehealth: Payer: Self-pay | Admitting: Primary Care

## 2017-10-24 NOTE — Telephone Encounter (Signed)
Noted and medication has been updated.

## 2017-10-24 NOTE — Telephone Encounter (Signed)
Will you fix in medication list?

## 2017-10-24 NOTE — Telephone Encounter (Signed)
Copied from CRM [id number]. Topic: Quick Communication - See Telephone Encounter >> Oct 24, 2017  2:46 PM Lorrine Kin, NT wrote: CRM for notification. See Telephone encounter for: 10/24/17. Patrick Franklin, patient's sister (on [place]), Dr Daisey Must  has decreased the patient's dantrolene (DANTRIUM) 100 MG capsule to only taking 1 capsule at bedtime. States that she just wanted Patrick Franklin to be aware that he is no longer taking 3 per day. CB#: [phone]

## 2017-10-27 ENCOUNTER — Other Ambulatory Visit: Payer: Self-pay | Admitting: Primary Care

## 2017-10-27 DIAGNOSIS — I10 Essential (primary) hypertension: Secondary | ICD-10-CM

## 2017-10-27 MED ORDER — METOPROLOL TARTRATE 25 MG PO TABS: 25.0000 mg | ORAL_TABLET | Freq: Two times a day (BID) | ORAL | 1 refills | Status: DC

## 2017-10-27 NOTE — Telephone Encounter (Signed)
Received faxed refill request. These medications have not been prescribed by Patrick Franklin. Last seen on 08/30/2017

## 2017-10-27 NOTE — Telephone Encounter (Signed)
Will refill metoprolol. Dantrolene needs to go to Dr. Daisey Must.

## 2017-10-31 NOTE — Telephone Encounter (Signed)
Dr Karel Jarvis, do you refill this medication for patient?

## 2017-10-31 NOTE — Telephone Encounter (Signed)
Hi, on his sister's last MyChart message with me, I had instructed her to wean off the Dantrolene, this was in July. Would not continue for now, I will discuss with them on visit on 10/17. Thanks

## 2017-11-09 ENCOUNTER — Encounter

## 2017-11-09 ENCOUNTER — Other Ambulatory Visit: Payer: Self-pay

## 2017-11-09 ENCOUNTER — Encounter: Payer: Self-pay | Admitting: Neurology

## 2017-11-09 ENCOUNTER — Ambulatory Visit (INDEPENDENT_AMBULATORY_CARE_PROVIDER_SITE_OTHER): Payer: 59 | Admitting: Neurology

## 2017-11-09 VITALS — BP 134/80 | HR 66 | Ht 68.0 in

## 2017-11-09 DIAGNOSIS — F0631 Mood disorder due to known physiological condition with depressive features: Secondary | ICD-10-CM

## 2017-11-09 DIAGNOSIS — Z8673 Personal history of transient ischemic attack (TIA), and cerebral infarction without residual deficits: Secondary | ICD-10-CM | POA: Diagnosis not present

## 2017-11-09 DIAGNOSIS — I69398 Other sequelae of cerebral infarction: Secondary | ICD-10-CM

## 2017-11-09 DIAGNOSIS — R51 Headache: Secondary | ICD-10-CM

## 2017-11-09 DIAGNOSIS — G40209 Localization-related (focal) (partial) symptomatic epilepsy and epileptic syndromes with complex partial seizures, not intractable, without status epilepticus: Secondary | ICD-10-CM | POA: Diagnosis not present

## 2017-11-09 DIAGNOSIS — R519 Headache, unspecified: Secondary | ICD-10-CM

## 2017-11-09 MED ORDER — DANTROLENE SODIUM 100 MG PO CAPS: ORAL_CAPSULE | ORAL | 11 refills | Status: DC

## 2017-11-09 MED ORDER — LACOSAMIDE 200 MG PO TABS: 200.0000 mg | ORAL_TABLET | Freq: Two times a day (BID) | ORAL | 5 refills | Status: DC

## 2017-11-09 MED ORDER — DIVALPROEX SODIUM ER 250 MG PO TB24: ORAL_TABLET | ORAL | 11 refills | Status: DC

## 2017-11-09 MED ORDER — CLONAZEPAM 0.5 MG PO TBDP: ORAL_TABLET | ORAL | 5 refills | Status: DC

## 2017-11-09 NOTE — Patient Instructions (Addendum)
1. Start Depakote ER 250mg : Take 1 tablet every night for 1 week, then increase to 2 tablets every night  2. Continue Vimpat 200mg  twice a day  3. Restart Dantrolene 100mg  every night  4. Refer to Behavioral Medicine for psychiatry and therapy  5. 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 Follow-up in 6 months, call for any changes  Seizure Precautions: 1. If medication has been prescribed for you to prevent seizures, take it exactly as directed.  Do not stop taking the medicine without talking to your doctor first, even if you have not had a seizure in a long time.   2. Avoid activities in which a seizure would cause danger to yourself or to others.  Don't operate dangerous machinery, swim alone, or climb in high or dangerous places, such as on ladders, roofs, or girders.  Do not drive unless your doctor says you may.  3. If you have any warning that you may have a seizure, lay down in a safe place where you can't hurt yourself.    4.  No driving for 6 months from last seizure, as per [facility].   Please refer to the following link on the [employer]'s website for more information: http://www.epilepsyfoundation.org/answerplace/Social/driving/drivingu.cfm   5.  Maintain good sleep hygiene. Avoid alcohol.  6.  Contact your doctor if you have any problems that may be related to the medicine you are taking.  7.  Call 911 and bring the patient back to the ED if:        A.  The seizure lasts longer than 5 minutes.       B.  The patient doesn't awaken shortly after the seizure  C.  The patient has new problems such as difficulty seeing, speaking or moving  D.  The patient was injured during the seizure  E.  The patient has a temperature over 102 F (39C)  F.  The patient vomited and now is having trouble breathing

## 2017-11-09 NOTE — Progress Notes (Signed)
NEUROLOGY FOLLOW UP OFFICE NOTE  Patrick Franklin [id number] 1970-07-24  HISTORY OF PRESENT ILLNESS: I had the pleasure of seeing Patrick Franklin in follow-up in the neurology clinic on 11/09/2017.  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 He is again accompanied by his brother-in-law who helps supplement the history today.  Records and images were personally reviewed where available.  Since his last visit, his sister contacted our office to report seizures on 07/25/17 and 08/07/17 lasting 2-3 minutes. Vimpat dose increased to 200mg  BID. Since the increase in Vimpat dose, he has had 2 more seizures, on 9/20 and most recently 10/26/17. No clear triggers. His family gave him clonazepam after both seizures and noted that he came to more quickly, within 3 minutes, and was quite clear as if nothing happened, asking for water. He denies any warning prior to the seizures. He is reporting constant headaches with pressure over both eyes and the left temporal region, he is sensitive to lights and sounds, no nausea/vomiting. He takes Advil on occasion. He sometimes tells family the pressure feels like he will have a seizure but it does not progress. He became tearful, reporting that on a daily basis he would have intermittent feelings of being trapped inside his body where he cannot do anything. He is able to talk and move his extremities, there are no physical changes with these symptoms.  He had moved from [place], [place] to live with his sister in [place]. He was in rehab for a long time there, he was discharged home on Baclofen and Dantrolene for muscle spasms. He takes Baclofen 20mg  BID. We had discussed tapering off the Dantrolene, but off the medication he has had more aching in his left arm and leg, more on the wrist/elbow and knee/ankle at night. He felt better on Dantrolene 100mg  qhs dose. He recalls having Botox in [facility] which helped some.  History on Initial Assessment  05/01/2017: This is a pleasant 47 year old right-handed man with a history of right MCA stroke s/p hemicraniectomy with subsequent seizures, presenting to establish care. He and his sister report that he had a stroke in December 2017, then has had 3 seizures since then. The first occurred summer 2018 with no prior warning, he woke up with sore muscles, tongue bite and urinary incontinence. The second seizure occurred in February 2019 in his sleep where his wife reported extremities were extended with tongue bite. The most recent one was last 04/23/17 witnessed by his family. His sister reports they were eating then he slumped forward and arms stiffened with shaking for 2 minutes, left arm was drawn up. He was unresponsive with head turn to the left. After 2-3 minutes, he would calm down, then start having movements but able to answer their questions. He had a lot of vomiting after both seizures. He reports a lot of pressure behind his eyes when he had the seizure. He was brought to [facility] ER where CBC and CMP were unremarkable. I personally reviewed head CT without contrast which showed severe right MCA encephalomalacia, left parietal approach shunt catheter tip in left lateral ventricle, no hydrocephalus. There was a hyperdense focus in the right lateral ventricle, ex vacuo rightward midline shift. He was continued on Vimpat 100mg  BID. He denies any missed medications or sleep deprivation, but there has been a lot of stress with moving to live with his sister and brother-in-law away from his wife. They have a sitter with him  during the day while his family is working.   He has pressure behind his eyes on a daily basis. Eye exam reported as normal. He has some neck pain. He has residual weakness on the left side, denies any numbness/tingling or pain. He denies any olfactory/gustatory hallucinations, deja vu, rising epigastric sensation, myoclonic jerks. Mood is okay. His sister has noticed he stares off at times, but  replies when called. Family fills his pillbox for him. He is also taking Ritalin prescribed at rehab because he was having attention difficulties and needed prompting to initiate activities, not progressing with therapy. They feel this has been very helpful for him, his brother-in-law noticed a difference in interaction when he ran out of medication, he was less interactive. He states he does not take it all the time. There is a family history of stroke. No family history of seizures.  Epilepsy Risk Factors:  Complete right MCA territory encephalomalacia s/p right hemicraniectomy. Otherwise he had a normal birth and early development.  There is no history of febrile convulsions, CNS infections such as meningitis/encephalitis, or family history of seizures.  PAST MEDICAL HISTORY: Past Medical History:  Diagnosis Date   Asthma    Cardiomegaly 04/13/2016   Depression    DVT (deep venous thrombosis) (HCC)    GERD (gastroesophageal reflux disease)    Hemiplegia and hemiparesis (HCC)    Left side   Hypertension    Neurogenic bladder    Neurogenic bowel    Neuropathy    Pulmonary embolism (HCC) 01/13/2016   Seizures (HCC)    Stroke (HCC) 12/31/2015   left side deficits    Subluxation of shoulder joint 04/13/2016   left   UTI (urinary tract infection)     MEDICATIONS: Current Outpatient Medications on File Prior to Visit  Medication Sig Dispense Refill   aspirin EC 81 MG tablet Take 81 mg by mouth daily.     atorvastatin (LIPITOR) 80 MG tablet Take 1 tablet by mouth at bedtime for cholesterol. 90 tablet 3   baclofen (LIORESAL) 20 MG tablet Take 1 tablet (20 mg total) by mouth 3 (three) times daily.     Cholecalciferol (VITAMIN D3) 5000 units CAPS Take 5,000 Units by mouth daily.     clonazePAM (KLONOPIN) 0.5 MG disintegrating tablet Administer 1 tablet as needed for seizure. 10 tablet 5   dantrolene (DANTRIUM) 100 MG capsule Take 100 mg by mouth at bedtime.       doxazosin (CARDURA) 1 MG tablet Take 1 tablet by mouth once daily for urinary retention. 90 tablet 3   DULoxetine (CYMBALTA) 60 MG capsule Take 60 mg by mouth daily.     lacosamide (VIMPAT) 200 MG TABS tablet Take 1 tablet (200 mg total) by mouth 2 (two) times daily. 60 tablet 5   Melatonin 3 MG TABS Take by mouth.     Methylphenidate HCl ER, LA, (RITALIN LA) 60 MG CP24 Take 1 tablet by mouth daily. 30 capsule 0   metoprolol tartrate (LOPRESSOR) 25 MG tablet Take 1 tablet (25 mg total) by mouth 2 (two) times daily. 180 tablet 1   tamsulosin (FLOMAX) 0.4 MG CAPS capsule Take 1 capsule (0.4 mg total) by mouth daily. 10 capsule 0   No current facility-administered medications on file prior to visit.     ALLERGIES: Allergies  Allergen Reactions   Versed [Midazolam]     FAMILY HISTORY: Family History  Problem Relation Age of Onset   Stroke Maternal Uncle    Stroke Maternal Uncle  SOCIAL HISTORY: Social History   Socioeconomic History   Marital status: Married    Spouse name: Not on file   Number of children: Not on file   Years of education: Not on file   Highest education level: Not on file  Occupational History   Not on file  Social Needs   Financial resource strain: Not on file   Food insecurity:    Worry: Not on file    Inability: Not on file   Transportation needs:    Medical: Not on file    Non-medical: Not on file  Tobacco Use   Smoking status: Never Smoker   Smokeless tobacco: Never Used  Substance and Sexual Activity   Alcohol use: Not Currently    Frequency: Never   Drug use: Never   Sexual activity: Not on file  Lifestyle   Physical activity:    Days per week: Not on file    Minutes per session: Not on file   Stress: Not on file  Relationships   Social connections:    Talks on phone: Not on file    Gets together: Not on file    Attends religious service: Not on file    Active member of club or organization: Not on file     Attends meetings of clubs or organizations: Not on file    Relationship status: Not on file   Intimate partner violence:    Fear of current or ex partner: Not on file    Emotionally abused: Not on file    Physically abused: Not on file    Forced sexual activity: Not on file  Other Topics Concern   Not on file  Social History Narrative   Single.   4 children.    Once worked as an Teacher, adult education.    Enjoys reading.     REVIEW OF SYSTEMS: Constitutional: No fevers, chills, or sweats, no generalized fatigue, change in appetite Eyes: No visual changes, double vision, eye pain Ear, nose and throat: No hearing loss, ear pain, nasal congestion, sore throat Cardiovascular: No chest pain, palpitations Respiratory:  No shortness of breath at rest or with exertion, wheezes GastrointestinaI: No nausea, vomiting, diarrhea, abdominal pain, fecal incontinence Genitourinary:  No dysuria, urinary retention or frequency Musculoskeletal:  + neck pain, back pain Integumentary: No rash, pruritus, skin lesions Neurological: as above Psychiatric: No depression, insomnia, anxiety Endocrine: No palpitations, fatigue, diaphoresis, mood swings, change in appetite, change in weight, increased thirst Hematologic/Lymphatic:  No anemia, purpura, petechiae. Allergic/Immunologic: no itchy/runny eyes, nasal congestion, recent allergic reactions, rashes  PHYSICAL EXAM: Vitals:   11/09/17 1146  BP: 134/80  Pulse: 66  SpO2: 99%   General: No acute distress, sitting on wheelchair, became tearful during the visit Head:  Normocephalic/atraumatic Neck: supple, no paraspinal tenderness, full range of motion Heart:  Regular rate and rhythm Lungs:  Clear to auscultation bilaterally Back: No paraspinal tenderness Skin/Extremities: No rash, no edema Neurological Exam: alert and oriented to person, place, and time. No aphasia or dysarthria. Fund of knowledge is appropriate.  Recent and remote memory are intact.   Attention and concentration are normal.    Able to name objects and repeat phrases. Cranial nerves: Pupils equal, round, reactive to light. Extraocular movements intact with no nystagmus. Visual extinction to simultaneous stimulation/neglect on left. Left facial asymmetry. Tongue, uvula, palate midline.  Motor: Increased tone on left UE distally. Muscle strength 5/5 on right UE and LE, 2/5 left UE, 4/5 proximal left LE, 0/5 left foot  dorsiflexion/plantarflexion. Sensation to light touch intact. Deep tendon reflexes brisk on left. Finger to nose testing intact on right UE.  Gait not tested.  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 He continues to have recurrent seizures on Vimpat 200mg  BID. He is also reporting daily headaches, as well as mood changes. We discussed adding on Depakote ER 250mg  qhs x 1 week, then increase to 500mg  qhs, to help with seizures/headache prophylaxis and mood stabilization. Side effects discussed. He is agreeable to seeing Behavioral Medicine for depression. He is reporting more pain from muscle spasticity, continue Baclofen and Dantrolene, referral to PMR will be sent for post-stroke spasticity, consideration for Botox. HE has prn clonazepam for breakthrough seizure. His family had expressed concern about his decision-making capacity due to poor decisions made post-stroke, he is scheduled for Neuropsych testing in January. He does not drive. He will follow-up in 6 months and knows to call for any changes.   Thank you for allowing me to participate in his care.  Please do not hesitate to call for any questions or concerns.  The duration of this appointment visit was 30 minutes of face-to-face time with the patient.  Greater than 50% of this time was spent in counseling, explanation of diagnosis, planning of further management, and coordination of care.   Patrcia Dolly,  M.D.   CC: Vernona Rieger, NP

## 2017-11-15 ENCOUNTER — Ambulatory Visit (INDEPENDENT_AMBULATORY_CARE_PROVIDER_SITE_OTHER): Payer: 59 | Admitting: Primary Care

## 2017-11-15 ENCOUNTER — Telehealth: Payer: Self-pay

## 2017-11-15 ENCOUNTER — Ambulatory Visit: Payer: PRIVATE HEALTH INSURANCE | Admitting: Primary Care

## 2017-11-15 ENCOUNTER — Encounter: Payer: Self-pay | Admitting: Primary Care

## 2017-11-15 VITALS — BP 130/84 | HR 95 | Temp 98.1°F | Ht 68.0 in | Wt 192.0 lb

## 2017-11-15 DIAGNOSIS — Z23 Encounter for immunization: Secondary | ICD-10-CM

## 2017-11-15 DIAGNOSIS — F3342 Major depressive disorder, recurrent, in full remission: Secondary | ICD-10-CM

## 2017-11-15 DIAGNOSIS — I10 Essential (primary) hypertension: Secondary | ICD-10-CM | POA: Diagnosis not present

## 2017-11-15 DIAGNOSIS — Z8673 Personal history of transient ischemic attack (TIA), and cerebral infarction without residual deficits: Secondary | ICD-10-CM

## 2017-11-15 DIAGNOSIS — G40909 Epilepsy, unspecified, not intractable, without status epilepticus: Secondary | ICD-10-CM

## 2017-11-15 DIAGNOSIS — N200 Calculus of kidney: Secondary | ICD-10-CM

## 2017-11-15 NOTE — Telephone Encounter (Signed)
-----   Message from Patrick Franklin, CCC-SLP sent at 11/15/2017 10:18 AM EDT ----- FYI:  Your patient is scheduled to see Elisha Ponder on 12/05/17. Pt is aware.  Thank you for the referral.

## 2017-11-15 NOTE — Assessment & Plan Note (Signed)
Following with neurology. Continue Vimpat, Depakote, Baclofen, dantrolene, clonazepam PRN. Denies recent seizure activity.

## 2017-11-15 NOTE — Assessment & Plan Note (Signed)
Stable in the office today, continue current regimen.

## 2017-11-15 NOTE — Patient Instructions (Signed)
Follow up with Synetta Fail as scheduled on November 12th.   Please schedule a physical with me in 6 months. Make sure to come fasting for labs. No food for 4 hours prior. You can have water and black coffee.   It was a pleasure to see you today!

## 2017-11-15 NOTE — Addendum Note (Signed)
Addended by: Tawnya Crook on: 11/15/2017 04:37 PM   Modules accepted: Orders

## 2017-11-15 NOTE — Assessment & Plan Note (Signed)
Following with neurology. Residing with family who is caring for his daily needs. Overall stable. Continue statin and aspirin.

## 2017-11-15 NOTE — Progress Notes (Signed)
Subjective:    Patient ID: Patrick Franklin, male    DOB: 09/12/1970, 47 y.o.   MRN: [id number]  HPI  [name] is a 47 year old male who presents today for follow up.  1) Essential Hypertension: Currently managed on metoprolol tartrate 25 mg BID. He denies chest pain, dizziness, shortness of breath.   2) History of Ischemic Right MCA Stroke/Hemiparesis affecting Left Side/Seizures: Currently following with neurology with his last visit being last week. He continued to experience seizures on Vimpat 200 mg BID so Depakote ER 500 mg HS was added. He was also referred to [facility] for depression. His Baclofen and Dantrolene were continued. His clonazepam was continued to use PRN for seizures.   3) Urinary Retention/Renal Stones: Currently following with Urology. Left sided nephrolithiasis. History of spontaneously passing stones on his own. The plan is observation for the left renal stones. He taking doxazosin daily at bedtime. He was also encouraged to increase liquid intake to output 2.5 liters daily.  4) Depression: Referred to psychology and has an appointment November 12th with Synetta Fail at [facility]. He reports not feeling like himself, feels down/depressed, feels useless. PHQ 9 score of 13 today. He denies SI/HI.   Review of Systems  Respiratory: Negative for shortness of breath.   Cardiovascular: Negative for chest pain.  Genitourinary: Negative for dysuria and flank pain.  Psychiatric/Behavioral:       See HPI       Past Medical History:  Diagnosis Date   Asthma    Cardiomegaly 04/13/2016   Depression    DVT (deep venous thrombosis) (HCC)    GERD (gastroesophageal reflux disease)    Hemiplegia and hemiparesis    Left side   Hypertension    Neurogenic bladder    Neurogenic bowel    Neuropathy    Pulmonary embolism (HCC) 01/13/2016   Seizures (HCC)    Stroke (HCC) 12/31/2015   left side deficits    Subluxation of shoulder joint 04/13/2016    left   UTI (urinary tract infection)      Social History   Socioeconomic History   Marital status: Legally Separated    Spouse name: Not on file   Number of children: Not on file   Years of education: Not on file   Highest education level: Not on file  Occupational History   Not on file  Social Needs   Financial resource strain: Not on file   Food insecurity:    Worry: Not on file    Inability: Not on file   Transportation needs:    Medical: Not on file    Non-medical: Not on file  Tobacco Use   Smoking status: Never Smoker   Smokeless tobacco: Never Used  Substance and Sexual Activity   Alcohol use: Not Currently    Frequency: Never   Drug use: Never   Sexual activity: Not on file  Lifestyle   Physical activity:    Days per week: Not on file    Minutes per session: Not on file   Stress: Not on file  Relationships   Social connections:    Talks on phone: Not on file    Gets together: Not on file    Attends religious service: Not on file    Active member of club or organization: Not on file    Attends meetings of clubs or organizations: Not on file    Relationship status: Not on file   Intimate partner violence:  Fear of current or ex partner: Not on file    Emotionally abused: Not on file    Physically abused: Not on file    Forced sexual activity: Not on file  Other Topics Concern   Not on file  Social History Narrative   Single.   4 children.    Once worked as an Teacher, adult education.    Enjoys reading.     Past Surgical History:  Procedure Laterality Date   CRANIECTOMY  2017   CRANIOPLASTY  2018   IVC FILTER INSERTION  2017   PEG TUBE PLACEMENT  2017   VENTRICULOPERITONEAL SHUNT  2018    Family History  Problem Relation Age of Onset   Stroke Maternal Uncle    Stroke Maternal Uncle     Allergies  Allergen Reactions   Versed [Midazolam]     Current Outpatient Medications on File Prior to Visit  Medication Sig Dispense  Refill   aspirin EC 81 MG tablet Take 81 mg by mouth daily.     atorvastatin (LIPITOR) 80 MG tablet Take 1 tablet by mouth at bedtime for cholesterol. 90 tablet 3   baclofen (LIORESAL) 20 MG tablet Take 1 tablet (20 mg total) by mouth 3 (three) times daily.     Cholecalciferol (VITAMIN D3) 5000 units CAPS Take 5,000 Units by mouth daily.     clonazePAM (KLONOPIN) 0.5 MG disintegrating tablet Administer 1 tablet as needed for seizure. 10 tablet 5   dantrolene (DANTRIUM) 100 MG capsule Take 1 tablet every night 30 capsule 11   divalproex (DEPAKOTE ER) 250 MG 24 hr tablet Take 1 tablet every night for 1 week, then increase to 2 tablets at bedtime 60 tablet 11   doxazosin (CARDURA) 1 MG tablet Take 1 tablet by mouth once daily for urinary retention. 90 tablet 3   DULoxetine (CYMBALTA) 60 MG capsule Take 60 mg by mouth daily.     lacosamide (VIMPAT) 200 MG TABS tablet Take 1 tablet (200 mg total) by mouth 2 (two) times daily. 60 tablet 5   Melatonin 3 MG TABS Take by mouth.     metoprolol tartrate (LOPRESSOR) 25 MG tablet Take 1 tablet (25 mg total) by mouth 2 (two) times daily. 180 tablet 1   No current facility-administered medications on file prior to visit.     BP 130/84    Pulse 95    Temp 98.1 F (36.7 C) (Oral)    Ht 5\' 8"  (1.727 m)    Wt 192 lb (87.1 kg)    SpO2 95%    BMI 29.19 kg/m    Objective:   Physical Exam  Constitutional: He appears well-nourished.  Neck: Neck supple.  Cardiovascular: Normal rate and regular rhythm.  Respiratory: Effort normal and breath sounds normal.  Skin: Skin is warm and dry.  Psychiatric: He has a normal mood and affect.           Assessment & Plan:

## 2017-11-15 NOTE — Assessment & Plan Note (Signed)
PHQ 9 score of 13 today. Will have him follow up with Synetta Fail as scheduled. Continue Cymbalta 60 mg for now. He will update if symptoms persist despite treatment with Synetta Fail.

## 2017-11-15 NOTE — Telephone Encounter (Signed)
Received staff message below

## 2017-11-15 NOTE — Assessment & Plan Note (Signed)
Overall stable, following with Urology. Plan is to monitor without treatment.

## 2017-12-05 ENCOUNTER — Ambulatory Visit (INDEPENDENT_AMBULATORY_CARE_PROVIDER_SITE_OTHER): Payer: 59 | Admitting: Psychology

## 2017-12-05 DIAGNOSIS — F332 Major depressive disorder, recurrent severe without psychotic features: Secondary | ICD-10-CM

## 2017-12-07 ENCOUNTER — Encounter: Payer: Self-pay | Admitting: Physical Medicine & Rehabilitation

## 2017-12-12 ENCOUNTER — Ambulatory Visit (INDEPENDENT_AMBULATORY_CARE_PROVIDER_SITE_OTHER): Payer: 59 | Admitting: Psychology

## 2017-12-12 DIAGNOSIS — F332 Major depressive disorder, recurrent severe without psychotic features: Secondary | ICD-10-CM | POA: Diagnosis not present

## 2017-12-26 ENCOUNTER — Ambulatory Visit (INDEPENDENT_AMBULATORY_CARE_PROVIDER_SITE_OTHER): Payer: 59 | Admitting: Psychology

## 2017-12-26 DIAGNOSIS — F411 Generalized anxiety disorder: Secondary | ICD-10-CM | POA: Diagnosis not present

## 2018-01-05 ENCOUNTER — Encounter: Payer: Self-pay | Admitting: Physical Medicine & Rehabilitation

## 2018-01-11 ENCOUNTER — Other Ambulatory Visit: Payer: Self-pay | Admitting: Neurology

## 2018-01-11 MED ORDER — DIVALPROEX SODIUM ER 250 MG PO TB24: ORAL_TABLET | ORAL | 11 refills | Status: DC

## 2018-01-12 ENCOUNTER — Other Ambulatory Visit: Payer: Self-pay | Admitting: Primary Care

## 2018-01-12 NOTE — Telephone Encounter (Signed)
Never filled by you  Last fill: 08/09/2017 by Dr. Sharen HonesGutierrez Last OV: 11/15/2017

## 2018-01-12 NOTE — Telephone Encounter (Signed)
Please send refill request to neurology.

## 2018-01-15 ENCOUNTER — Encounter: Payer: 59 | Attending: Physical Medicine & Rehabilitation

## 2018-01-15 ENCOUNTER — Encounter: Payer: Self-pay | Admitting: Physical Medicine & Rehabilitation

## 2018-01-15 ENCOUNTER — Ambulatory Visit: Payer: 59 | Admitting: Physical Medicine & Rehabilitation

## 2018-01-15 VITALS — BP 119/82 | HR 67 | Wt 192.0 lb

## 2018-01-15 DIAGNOSIS — F329 Major depressive disorder, single episode, unspecified: Secondary | ICD-10-CM | POA: Insufficient documentation

## 2018-01-15 DIAGNOSIS — Z8673 Personal history of transient ischemic attack (TIA), and cerebral infarction without residual deficits: Secondary | ICD-10-CM | POA: Diagnosis not present

## 2018-01-15 DIAGNOSIS — I69398 Other sequelae of cerebral infarction: Secondary | ICD-10-CM | POA: Insufficient documentation

## 2018-01-15 DIAGNOSIS — H5340 Unspecified visual field defects: Secondary | ICD-10-CM | POA: Insufficient documentation

## 2018-01-15 DIAGNOSIS — Z86711 Personal history of pulmonary embolism: Secondary | ICD-10-CM | POA: Diagnosis not present

## 2018-01-15 DIAGNOSIS — G629 Polyneuropathy, unspecified: Secondary | ICD-10-CM | POA: Diagnosis not present

## 2018-01-15 DIAGNOSIS — I69354 Hemiplegia and hemiparesis following cerebral infarction affecting left non-dominant side: Secondary | ICD-10-CM | POA: Insufficient documentation

## 2018-01-15 DIAGNOSIS — I10 Essential (primary) hypertension: Secondary | ICD-10-CM | POA: Diagnosis not present

## 2018-01-15 DIAGNOSIS — G8114 Spastic hemiplegia affecting left nondominant side: Secondary | ICD-10-CM

## 2018-01-15 DIAGNOSIS — Z87440 Personal history of urinary (tract) infections: Secondary | ICD-10-CM | POA: Insufficient documentation

## 2018-01-15 DIAGNOSIS — K219 Gastro-esophageal reflux disease without esophagitis: Secondary | ICD-10-CM | POA: Diagnosis not present

## 2018-01-15 DIAGNOSIS — K592 Neurogenic bowel, not elsewhere classified: Secondary | ICD-10-CM | POA: Insufficient documentation

## 2018-01-15 DIAGNOSIS — N319 Neuromuscular dysfunction of bladder, unspecified: Secondary | ICD-10-CM | POA: Insufficient documentation

## 2018-01-15 DIAGNOSIS — J45909 Unspecified asthma, uncomplicated: Secondary | ICD-10-CM | POA: Diagnosis not present

## 2018-01-15 DIAGNOSIS — Z86718 Personal history of other venous thrombosis and embolism: Secondary | ICD-10-CM | POA: Diagnosis not present

## 2018-01-15 NOTE — Progress Notes (Signed)
Subjective:  Patient is a Stroke Botox patient, UDS not required, removed   Patient ID: Patrick Franklin, male    DOB: 11/11/1970, 47 y.o.   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  Pt went to [facility] for inpatient rehab, then went to SNF in [facility] for 87mo  In addition to weakness pt has visual field cut on left side.  Pt needs help with dressing and bathing.  Brother in Social worker and sister assist  Amb with [facility] HH distance with assistance of 1 person.  Able to self propel WC  Good bowel and Bladder control  Pt c/o spasms in LUE and LLE, worse at night.  He states his elbow bends up and his fist is in his face at night.  He sleeps in a Knee immobilizer on the left and has soreness and inability to stand if he does not use it.  Pt had Botox injection at [facility] in the chest area.  Pt states that PT indicated that is helped with ROM.     Pt has occ pain in shoulder , uses "give more" sling  Pain Inventory Average Pain 6 Pain Right Now 3 My pain is intermittent, sharp and stabbing  In the last 24 hours, has pain interfered with the following? General activity 0 Relation with others 0 Enjoyment of life 0 What TIME of day is your pain at its worst? evening Sleep (in general) Fair  Pain is worse with: some activites Pain improves with: rest and medication Relief from Meds: 7  Mobility walk with assistance use a cane use a walker ability to climb steps?  yes do you drive?  no  Function retired I need assistance with the following:  dressing, bathing, meal prep, household duties and shopping  Neuro/Psych tingling trouble walking spasms dizziness confusion loss of taste or smell  Prior Studies Any changes since last visit?  no  Physicians involved in your care Any changes since last visit?  no   Family History  Problem Relation Age of Onset  . Stroke Maternal Uncle   . Stroke Maternal Uncle    Social History    Socioeconomic History  . Marital status: Legally Separated    Spouse name: Not on file  . Number of children: Not on file  . Years of education: Not on file  . Highest education level: Not on file  Occupational History  . Not on file  Social Needs  . Financial resource strain: Not on file  . Food insecurity:    Worry: Not on file    Inability: Not on file  . Transportation needs:    Medical: Not on file    Non-medical: Not on file  Tobacco Use  . Smoking status: Never Smoker  . Smokeless tobacco: Never Used  Substance and Sexual Activity  . Alcohol use: Not Currently    Frequency: Never  . Drug use: Never  . Sexual activity: Not on file  Lifestyle  . Physical activity:    Days per week: Not on file    Minutes per session: Not on file  . Stress: Not on file  Relationships  . Social connections:    Talks on phone: Not on file    Gets together: Not on file    Attends religious service: Not on file    Active member of club or organization: Not on file    Attends meetings of clubs or organizations:  Not on file    Relationship status: Not on file  Other Topics Concern  . Not on file  Social History Narrative   Single.   4 children.    Once worked as an Teacher, adult education.    Enjoys reading.    Past Surgical History:  Procedure Laterality Date  . CRANIECTOMY  2017  . CRANIOPLASTY  2018  . IVC FILTER INSERTION  2017  . PEG TUBE PLACEMENT  2017  . VENTRICULOPERITONEAL SHUNT  2018   Past Medical History:  Diagnosis Date  . Asthma   . Cardiomegaly 04/13/2016  . Depression   . DVT (deep venous thrombosis) (HCC)   . GERD (gastroesophageal reflux disease)   . Hemiplegia and hemiparesis    Left side  . Hypertension   . Neurogenic bladder   . Neurogenic bowel   . Neuropathy   . Pulmonary embolism (HCC) 01/13/2016  . Seizures (HCC)   . Stroke (HCC) 12/31/2015   left side deficits   . Subluxation of shoulder joint 04/13/2016   left  . UTI (urinary tract infection)     BP 119/82   Pulse 67   Wt 192 lb (87.1 kg)   SpO2 95%   BMI 29.19 kg/m   Opioid Risk Score:   Fall Risk Score:  `1  Depression screen PHQ 2/9  Depression screen PHQ 2/9 11/15/2017  Decreased Interest 1  Down, Depressed, Hopeless 1  PHQ - 2 Score 2  Altered sleeping 3  Tired, decreased energy 3  Change in appetite 0  Feeling bad or failure about yourself  3  Trouble concentrating 1  Moving slowly or fidgety/restless 1  Suicidal thoughts 0  PHQ-9 Score 13  Difficult doing work/chores Not difficult at all     Review of Systems  Constitutional: Negative.   HENT: Negative.   Eyes: Negative.   Respiratory: Negative.   Cardiovascular: Negative.   Gastrointestinal: Negative.   Endocrine: Negative.   Genitourinary: Negative.   Musculoskeletal: Positive for arthralgias, gait problem and myalgias.  Skin: Negative.   Allergic/Immunologic: Negative.   Neurological: Positive for dizziness and numbness.  Hematological: Negative.   Psychiatric/Behavioral: Positive for confusion.  All other systems reviewed and are negative.      Objective:   Physical Exam Vitals signs and nursing note reviewed.  Constitutional:      Appearance: Normal appearance.  HENT:     Head:     Comments: Right temporal fossa deeper than left at craniotomy site    Nose: Nose normal.     Mouth/Throat:     Mouth: Mucous membranes are dry.  Eyes:     Pupils: Pupils are equal, round, and reactive to light.     Comments: Left homonymous hemianopsia  Cardiovascular:     Rate and Rhythm: Normal rate and regular rhythm.     Pulses: Normal pulses.     Heart sounds: Normal heart sounds.  Pulmonary:     Effort: Pulmonary effort is normal. No respiratory distress.     Breath sounds: Normal breath sounds. No stridor.  Abdominal:     General: Abdomen is flat. Bowel sounds are normal. There is no distension.     Palpations: Abdomen is soft.  Musculoskeletal:     Comments: Reduced range of motion left  shoulder elbow wrist fingers and left knee  Skin:    General: Skin is warm and dry.  Neurological:     Mental Status: He is alert.     Motor: Abnormal muscle tone present.  Gait: Gait abnormal.     Comments: Motor strength is 0/5 in the left deltoid, bicep, tricep, grip, hip flexor, knee extensor, ankle dorsiflexor 5/5 in the right deltoid, bicep, tricep, grip, hip flexor, knee extensor, dorsiflexor  Tone MAS 3 in the left pectoralis MAS 3 in the left elbow flexors MAS 3 in the finger flexors both the DIP and PIP MAS 3 to the left thumb flexor MAS 2 to the knee flexor With range of motion there is both knee extensor and knee flexor spasticity. No evidence of clonus at the ankle Sensation intact light touch in the left upper and left lower limb There is tactile extinction with double simultaneous stimulation            Assessment & Plan:  1.  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  Overall the upper extremity is more involved in the lower extremity with his spasticity.  I agree with the combination of baclofen and dantrolene.  He appears to be tolerating this well without evidence of sedation.  Other options may be's Zanaflex. In terms of injection therapy I would recommend botulinum toxin injection left upper extremity Recommended dosing Pectoralis 50 units Biceps 100 units FDS 50 units FDP 50 units FPL 25 units FCR 25 units Return to clinic 2 weeks He may benefit from outpatient therapy if he does have transportation after the injection is performed.   To treat lower extremity we may have to switch to Dysport in order to have adequate dosing.

## 2018-01-15 NOTE — Telephone Encounter (Signed)
Meagen, can you pls confirm with his sister that this is his dose. I thought we had [facility], hopefully they can help with spasticity once established there. Thanks

## 2018-01-23 NOTE — Telephone Encounter (Signed)
MyChart message sent on 01/15/18 to pt's sister, Raynelle FanningJulie, asking to clarify/confirm dosage.  No response as of yet.

## 2018-01-24 DIAGNOSIS — Z8673 Personal history of transient ischemic attack (TIA), and cerebral infarction without residual deficits: Secondary | ICD-10-CM

## 2018-01-24 HISTORY — DX: Personal history of transient ischemic attack (TIA), and cerebral infarction without residual deficits: Z86.73

## 2018-01-25 ENCOUNTER — Ambulatory Visit (INDEPENDENT_AMBULATORY_CARE_PROVIDER_SITE_OTHER): Payer: 59 | Admitting: Psychology

## 2018-01-25 DIAGNOSIS — F332 Major depressive disorder, recurrent severe without psychotic features: Secondary | ICD-10-CM

## 2018-01-29 MED ORDER — BACLOFEN 20 MG PO TABS: 20.0000 mg | ORAL_TABLET | Freq: Two times a day (BID) | ORAL | 1 refills | Status: DC

## 2018-01-30 NOTE — Telephone Encounter (Signed)
See MyChart message, [name] reported patient takes 20mg  BID. Rx sent by staff.

## 2018-02-07 ENCOUNTER — Telehealth: Payer: Self-pay | Admitting: *Deleted

## 2018-02-07 NOTE — Telephone Encounter (Signed)
Patients sister (caretaker) left a message asking for treatment plan for her brother.  She knows BOTOX is scheduled but was wondering what the follow up plan may be regarding therapies and such. I contacted the patient's sister (DPR authorized communication) and confirmed  The BOTOX appointment and explained Dr. Wynn Banker note indicating potential outpatient therapies if he is able to transport. Patient's sister verbalized understanding and then went onto say that transportation is going to be an issue.  She and her husband are working and raising their children plus the patient's child. They don't see the ability to handle additional commitments.  She states patient was discharged from previous home therapy because he had reached a plateau  She claims that patient is no longer complaining about pain issues and is stretching his hand on a daily basis.  She was wondering given the difficulty of getting to O/P therapies and the absence of pain, is it really necessary to continue with BOTOX?      She reports patient has digressed with physical activity.  He used to get up and walk frequently.  Now he sits in his chair, watches TV and plays on his phone. They feel a care facility is imminent.  [employer] runs out in roughly 6 months, then [place] will kick in.  Please advise

## 2018-02-08 ENCOUNTER — Ambulatory Visit (INDEPENDENT_AMBULATORY_CARE_PROVIDER_SITE_OTHER): Payer: 59 | Admitting: Internal Medicine

## 2018-02-08 ENCOUNTER — Encounter: Payer: Self-pay | Admitting: Internal Medicine

## 2018-02-08 ENCOUNTER — Telehealth: Payer: Self-pay | Admitting: Primary Care

## 2018-02-08 VITALS — BP 126/80 | HR 67 | Temp 98.0°F

## 2018-02-08 DIAGNOSIS — B9789 Other viral agents as the cause of diseases classified elsewhere: Secondary | ICD-10-CM | POA: Diagnosis not present

## 2018-02-08 DIAGNOSIS — Z7189 Other specified counseling: Secondary | ICD-10-CM | POA: Diagnosis not present

## 2018-02-08 DIAGNOSIS — J069 Acute upper respiratory infection, unspecified: Secondary | ICD-10-CM

## 2018-02-08 MED ORDER — HYDROCODONE-HOMATROPINE 5-1.5 MG/5ML PO SYRP: 5.0000 mL | ORAL_SOLUTION | Freq: Three times a day (TID) | ORAL | 0 refills | Status: DC | PRN

## 2018-02-08 NOTE — Patient Instructions (Signed)
Cough, Adult  A cough helps to clear your throat and lungs. A cough may last only 2-3 weeks (acute), or it may last longer than 8 weeks (chronic). Many different things can cause a cough. A cough may be a sign of an illness or another medical condition. Follow these instructions at home:  Pay attention to any changes in your cough.  Take medicines only as told by your doctor. ? If you were prescribed an antibiotic medicine, take it as told by your doctor. Do not stop taking it even if you start to feel better. ? Talk with your doctor before you try using a cough medicine.  Drink enough fluid to keep your pee (urine) clear or pale yellow.  If the air is dry, use a cold steam vaporizer or humidifier in your home.  Stay away from things that make you cough at work or at home.  If your cough is worse at night, try using extra pillows to raise your head up higher while you sleep.  Do not smoke, and try not to be around smoke. If you need help quitting, ask your doctor.  Do not have caffeine.  Do not drink alcohol.  Rest as needed. Contact a doctor if:  You have new problems (symptoms).  You cough up yellow fluid (pus).  Your cough does not get better after 2-3 weeks, or your cough gets worse.  Medicine does not help your cough and you are not sleeping well.  You have pain that gets worse or pain that is not helped with medicine.  You have a fever.  You are losing weight and you do not know why.  You have night sweats. Get help right away if:  You cough up blood.  You have trouble breathing.  Your heartbeat is very fast. This information is not intended to replace advice given to you by your health care provider. Make sure you discuss any questions you have with your health care provider. Document Released: 09/23/2010 Document Revised: 06/18/2015 Document Reviewed: 03/19/2014 Elsevier Interactive Patient Education  2019 [employer].

## 2018-02-08 NOTE — Telephone Encounter (Signed)
Form placed in Kate's Inbox in her office

## 2018-02-08 NOTE — Telephone Encounter (Signed)
botox is not only for treatment of pain but used to prevent contracture.  PT, OT does not need to be done for it to be effective.  If family desires placement , they will need to contact PCP

## 2018-02-08 NOTE — Telephone Encounter (Signed)
Completed and placed in Chan's inbox, ready for pick up.

## 2018-02-08 NOTE — Telephone Encounter (Signed)
Contacted patients sister and conveyed Dr. Wynn Banker message

## 2018-02-08 NOTE — Progress Notes (Signed)
HPI  Pt presents to the clinic today with c/o runny nose and cough. He reports this started 2 weeks ago. He is blowing clear mucous out of his nose. The cough is mostly non productive. He denies headache, ear pain, nasal congestion, sore throat or shortness of breath. He denies fever, chills or body aches. He has tried Mucinex with minimal relief. He has a history of asthma but is not on any inhalers. He has had sick contacts. Flu shot is UTD.  He would also like a DNR and MOST form completed today.  Review of Systems      Past Medical History:  Diagnosis Date  . Asthma   . Cardiomegaly 04/13/2016  . Depression   . DVT (deep venous thrombosis) (HCC)   . GERD (gastroesophageal reflux disease)   . Hemiplegia and hemiparesis    Left side  . Hypertension   . Neurogenic bladder   . Neurogenic bowel   . Neuropathy   . Pulmonary embolism (HCC) 01/13/2016  . Seizures (HCC)   . Stroke (HCC) 12/31/2015   left side deficits   . Subluxation of shoulder joint 04/13/2016   left  . UTI (urinary tract infection)     Family History  Problem Relation Age of Onset  . Stroke Maternal Uncle   . Stroke Maternal Uncle     Social History   Socioeconomic History  . Marital status: Legally Separated    Spouse name: Not on file  . Number of children: Not on file  . Years of education: Not on file  . Highest education level: Not on file  Occupational History  . Not on file  Social Needs  . Financial resource strain: Not on file  . Food insecurity:    Worry: Not on file    Inability: Not on file  . Transportation needs:    Medical: Not on file    Non-medical: Not on file  Tobacco Use  . Smoking status: Never Smoker  . Smokeless tobacco: Never Used  Substance and Sexual Activity  . Alcohol use: Not Currently    Frequency: Never  . Drug use: Never  . Sexual activity: Not on file  Lifestyle  . Physical activity:    Days per week: Not on file    Minutes per session: Not on file  .  Stress: Not on file  Relationships  . Social connections:    Talks on phone: Not on file    Gets together: Not on file    Attends religious service: Not on file    Active member of club or organization: Not on file    Attends meetings of clubs or organizations: Not on file    Relationship status: Not on file  . Intimate partner violence:    Fear of current or ex partner: Not on file    Emotionally abused: Not on file    Physically abused: Not on file    Forced sexual activity: Not on file  Other Topics Concern  . Not on file  Social History Narrative   Single.   4 children.    Once worked as an Teacher, adult education.    Enjoys reading.     Allergies  Allergen Reactions  . Versed [Midazolam]      Constitutional: Denies headache, fatigue, fever or abrupt weight changes.  HEENT:  Positive runny nose. Denies eye redness, eye pain, pressure behind the eyes, facial pain, nasal congestion, ear pain, ringing in the ears, wax buildup, or sore throat. Respiratory:  Positive cough. Denies difficulty breathing or shortness of breath.  Cardiovascular: Denies chest pain, chest tightness, palpitations or swelling in the hands or feet.   No other specific complaints in a complete review of systems (except as listed in HPI above).  Objective:   BP 126/80   Pulse 67   Temp 98 F (36.7 C) (Oral)   SpO2 97%   Wt Readings from Last 3 Encounters:  01/15/18 192 lb (87.1 kg)  11/15/17 192 lb (87.1 kg)  09/01/17 187 lb (84.8 kg)     General: Appears his stated age, chronically ill appearing, in NAD. HEENT: Head: normal shape and size, no sinus tenderness noted;Ears: Tm's gray and intact, normal light reflex; Nose: mucosa pink and moist, septum midline; Throat/Mouth: + PND. Teeth present, mucosa erythematous and moist, no exudate noted, no lesions or ulcerations noted.  Neck: No cervical lymphadenopathy.  Cardiovascular: Normal rate and rhythm. S1,S2 noted.  No murmur, rubs or gallops noted.   Pulmonary/Chest: Normal effort and positive vesicular breath sounds. No respiratory distress. No wheezes, rales or ronchi noted.       Assessment & Plan:   Viral URI with Cough:  Get some rest and drink plenty of water Start Zyrtec and Flonase OTC eRx for Hycodan cough syrup  Advanced Care Planning:  Reviewed DNR/MOST form with pt and HCPOA He does want to be a DNR He would accept hospital care, IV fluids, antibiotics if his condition could improve He does not want a breathing tube, IV fluids or antibiotics just to prolong his life and not feeding tube Copies scanned into chart, originals given to patient  RTC as needed or if symptoms persist.   Nicki Reaper, NP

## 2018-02-08 NOTE — Telephone Encounter (Signed)
Patient dropped of Handicapped Placard Form to be filled out by Jae Dire. Form is in RX tower.

## 2018-02-09 ENCOUNTER — Ambulatory Visit (INDEPENDENT_AMBULATORY_CARE_PROVIDER_SITE_OTHER): Payer: 59 | Admitting: Psychology

## 2018-02-09 DIAGNOSIS — F332 Major depressive disorder, recurrent severe without psychotic features: Secondary | ICD-10-CM

## 2018-02-09 NOTE — Telephone Encounter (Signed)
Spoken to patient's sister and was notified that form is ready for pick up. Left in the front office.

## 2018-02-12 ENCOUNTER — Encounter: Payer: 59 | Attending: Physical Medicine & Rehabilitation

## 2018-02-12 ENCOUNTER — Ambulatory Visit (HOSPITAL_BASED_OUTPATIENT_CLINIC_OR_DEPARTMENT_OTHER): Payer: 59 | Admitting: Physical Medicine & Rehabilitation

## 2018-02-12 ENCOUNTER — Encounter: Payer: Self-pay | Admitting: Physical Medicine & Rehabilitation

## 2018-02-12 VITALS — BP 117/76 | HR 70 | Wt 192.0 lb

## 2018-02-12 DIAGNOSIS — K592 Neurogenic bowel, not elsewhere classified: Secondary | ICD-10-CM | POA: Insufficient documentation

## 2018-02-12 DIAGNOSIS — I69354 Hemiplegia and hemiparesis following cerebral infarction affecting left non-dominant side: Secondary | ICD-10-CM | POA: Diagnosis present

## 2018-02-12 DIAGNOSIS — I10 Essential (primary) hypertension: Secondary | ICD-10-CM | POA: Diagnosis not present

## 2018-02-12 DIAGNOSIS — K219 Gastro-esophageal reflux disease without esophagitis: Secondary | ICD-10-CM | POA: Insufficient documentation

## 2018-02-12 DIAGNOSIS — J45909 Unspecified asthma, uncomplicated: Secondary | ICD-10-CM | POA: Diagnosis not present

## 2018-02-12 DIAGNOSIS — I69398 Other sequelae of cerebral infarction: Secondary | ICD-10-CM | POA: Insufficient documentation

## 2018-02-12 DIAGNOSIS — N319 Neuromuscular dysfunction of bladder, unspecified: Secondary | ICD-10-CM | POA: Diagnosis not present

## 2018-02-12 DIAGNOSIS — F329 Major depressive disorder, single episode, unspecified: Secondary | ICD-10-CM | POA: Diagnosis not present

## 2018-02-12 DIAGNOSIS — Z86711 Personal history of pulmonary embolism: Secondary | ICD-10-CM | POA: Diagnosis not present

## 2018-02-12 DIAGNOSIS — Z87440 Personal history of urinary (tract) infections: Secondary | ICD-10-CM | POA: Diagnosis not present

## 2018-02-12 DIAGNOSIS — Z86718 Personal history of other venous thrombosis and embolism: Secondary | ICD-10-CM | POA: Insufficient documentation

## 2018-02-12 DIAGNOSIS — G629 Polyneuropathy, unspecified: Secondary | ICD-10-CM | POA: Insufficient documentation

## 2018-02-12 DIAGNOSIS — H5340 Unspecified visual field defects: Secondary | ICD-10-CM | POA: Diagnosis not present

## 2018-02-12 DIAGNOSIS — G8114 Spastic hemiplegia affecting left nondominant side: Secondary | ICD-10-CM

## 2018-02-12 NOTE — Patient Instructions (Signed)
You received a Botox injection today. You may experience soreness at the needle injection sites. Please call us if any of the injection sites turns red after a couple days or if there is any drainage. You may experience muscle weakness as a result of Botox. This would improve with time but can take several weeks to improve. The Botox should start working in about one week. The Botox usually last 3 months. The injection can be repeated every 3 months as needed.

## 2018-02-12 NOTE — Progress Notes (Signed)
Botox Injection for spasticity using needle EMG guidance  Dilution: 50 Units/ml Indication: Severe spasticity which interferes with ADL,mobility and/or  hygiene and is unresponsive to medication management and other conservative care Informed consent was obtained after describing risks and benefits of the procedure with the patient. This includes bleeding, bruising, infection, excessive weakness, or medication side effects. A REMS form is on file and signed. Needle: 25g 92mm needle electrode Number of units per muscle LEFT Pectoralis 50 units Biceps 100 units FDS 50 units FDP 50 units FPL 25 units FCR 25 units Hamstring 100U All injections were done after obtaining appropriate EMG activity and after negative drawback for blood. The patient tolerated the procedure well. Post procedure instructions were given. A followup appointment was made.

## 2018-02-15 ENCOUNTER — Ambulatory Visit: Payer: Self-pay | Admitting: Psychology

## 2018-02-20 ENCOUNTER — Encounter

## 2018-02-20 ENCOUNTER — Encounter: Payer: 59 | Admitting: Psychology

## 2018-03-01 ENCOUNTER — Ambulatory Visit: Payer: 59 | Admitting: Psychology

## 2018-03-05 ENCOUNTER — Ambulatory Visit: Payer: Self-pay | Admitting: Primary Care

## 2018-03-05 ENCOUNTER — Telehealth: Payer: Self-pay | Admitting: Primary Care

## 2018-03-05 NOTE — Telephone Encounter (Signed)
[name] sister called requesting Jae DireKate to put in a order for the pt to get a TB skin test done so that the pt can attend a adult day program.

## 2018-03-05 NOTE — Telephone Encounter (Signed)
Noted. Patrick Franklin is aware.

## 2018-03-06 ENCOUNTER — Telehealth: Payer: Self-pay | Admitting: Primary Care

## 2018-03-06 ENCOUNTER — Ambulatory Visit (INDEPENDENT_AMBULATORY_CARE_PROVIDER_SITE_OTHER): Payer: 59

## 2018-03-06 DIAGNOSIS — Z111 Encounter for screening for respiratory tuberculosis: Secondary | ICD-10-CM

## 2018-03-06 NOTE — Telephone Encounter (Signed)
Partially completed, awaiting TB skin test results for final signature. Will need to attach medication list.

## 2018-03-06 NOTE — Telephone Encounter (Signed)
Place form/paperwork in Kate Clark's inbox for review and complete if necessary.  °

## 2018-03-06 NOTE — Telephone Encounter (Signed)
Pt dropped off forms for Adult Day Program to be filled out by provider. Placed in Rx tower for [employer].

## 2018-03-06 NOTE — Progress Notes (Signed)
Per orders of [name], NP, injection of TB skin test given by Nanci PinaFelicia  Rafi Kenneth. Patient tolerated injection well.

## 2018-03-09 LAB — TB SKIN TEST
INDURATION: 0 mm
TB Skin Test: NEGATIVE

## 2018-03-09 NOTE — Telephone Encounter (Signed)
Left message on vm for pt to call back.  Need to inform him the form is ready to pick up.

## 2018-03-09 NOTE — Telephone Encounter (Signed)
Patrick Franklin came in to have TB read today.  Results are neg and entered into chart.

## 2018-03-09 NOTE — Telephone Encounter (Signed)
Completed and placed up front for pick up. Ready for pick up.

## 2018-03-21 ENCOUNTER — Telehealth: Payer: Self-pay

## 2018-03-21 NOTE — Telephone Encounter (Signed)
Faxed med list as requested.

## 2018-03-21 NOTE — Telephone Encounter (Signed)
Patrick Franklin with [facility]; received the paperwork but will need med list signed by provider or CMA. Request med list faxed to Macario Carls at [phone]. Would like to get pt admitted by 03/25/18.

## 2018-03-26 ENCOUNTER — Encounter: Payer: Self-pay | Admitting: Physical Medicine & Rehabilitation

## 2018-03-26 ENCOUNTER — Encounter: Payer: 59 | Attending: Physical Medicine & Rehabilitation

## 2018-03-26 ENCOUNTER — Ambulatory Visit (HOSPITAL_BASED_OUTPATIENT_CLINIC_OR_DEPARTMENT_OTHER): Payer: 59 | Admitting: Physical Medicine & Rehabilitation

## 2018-03-26 ENCOUNTER — Telehealth: Payer: Self-pay

## 2018-03-26 VITALS — BP 116/80 | HR 68 | Ht 70.0 in | Wt 192.0 lb

## 2018-03-26 DIAGNOSIS — G629 Polyneuropathy, unspecified: Secondary | ICD-10-CM | POA: Insufficient documentation

## 2018-03-26 DIAGNOSIS — I69354 Hemiplegia and hemiparesis following cerebral infarction affecting left non-dominant side: Secondary | ICD-10-CM | POA: Diagnosis not present

## 2018-03-26 DIAGNOSIS — Z86711 Personal history of pulmonary embolism: Secondary | ICD-10-CM | POA: Insufficient documentation

## 2018-03-26 DIAGNOSIS — H5340 Unspecified visual field defects: Secondary | ICD-10-CM | POA: Diagnosis not present

## 2018-03-26 DIAGNOSIS — Z87440 Personal history of urinary (tract) infections: Secondary | ICD-10-CM | POA: Diagnosis not present

## 2018-03-26 DIAGNOSIS — K592 Neurogenic bowel, not elsewhere classified: Secondary | ICD-10-CM | POA: Insufficient documentation

## 2018-03-26 DIAGNOSIS — I69398 Other sequelae of cerebral infarction: Secondary | ICD-10-CM | POA: Insufficient documentation

## 2018-03-26 DIAGNOSIS — I10 Essential (primary) hypertension: Secondary | ICD-10-CM | POA: Diagnosis not present

## 2018-03-26 DIAGNOSIS — G8114 Spastic hemiplegia affecting left nondominant side: Secondary | ICD-10-CM

## 2018-03-26 DIAGNOSIS — Z86718 Personal history of other venous thrombosis and embolism: Secondary | ICD-10-CM | POA: Insufficient documentation

## 2018-03-26 DIAGNOSIS — Z8673 Personal history of transient ischemic attack (TIA), and cerebral infarction without residual deficits: Secondary | ICD-10-CM | POA: Diagnosis not present

## 2018-03-26 DIAGNOSIS — J45909 Unspecified asthma, uncomplicated: Secondary | ICD-10-CM | POA: Insufficient documentation

## 2018-03-26 DIAGNOSIS — K219 Gastro-esophageal reflux disease without esophagitis: Secondary | ICD-10-CM | POA: Diagnosis not present

## 2018-03-26 DIAGNOSIS — G8110 Spastic hemiplegia affecting unspecified side: Secondary | ICD-10-CM | POA: Diagnosis not present

## 2018-03-26 DIAGNOSIS — F329 Major depressive disorder, single episode, unspecified: Secondary | ICD-10-CM | POA: Insufficient documentation

## 2018-03-26 DIAGNOSIS — N319 Neuromuscular dysfunction of bladder, unspecified: Secondary | ICD-10-CM | POA: Diagnosis not present

## 2018-03-26 HISTORY — DX: Spastic hemiplegia affecting unspecified side: G81.10

## 2018-03-26 NOTE — Telephone Encounter (Signed)
No, we do not. We have the sheet which is the same as what [name] has at home.

## 2018-03-26 NOTE — Telephone Encounter (Signed)
Patrick Drilling, do you know where to get a placard with DNR?

## 2018-03-26 NOTE — Telephone Encounter (Signed)
Patrick Franklin sister (DNR signed) left v/m that pt will be starting at [facility] in [place] on 03/27/18. [name] was advised pt needs to have a placard with DNR on it. [name] has a DNR sheet at home that she can take tomorrow but will pick up the placard with DNR on it when it is ready. [name] request cb when ready for pick up.

## 2018-03-26 NOTE — Progress Notes (Signed)
Subjective:    Patient ID: Patrick Franklin, male    DOB: 22-Aug-1970, 48 y.o.   MRN: [id number]  HPI 02/12/2018 LEFT Pectoralis 50 units Biceps 100 units FDS 50 units FDP 50 units FPL 25 units FCR 25 units Hamstring 100U  The patient states that his leg has relaxed somewhat.  He does not have any ongoing physical therapy.  He does have a aide at home that assists with dressing bathing and mobility.  He does some walking with his sister.  He uses a Comptroller. His upper extremity is feeling looser.  He is able to position his hand on the armrest with his fingers in extension Pain Inventory Average Pain 9 Pain Right Now 0 My pain is intermittent, dull and aching  In the last 24 hours, has pain interfered with the following? General activity 0 Relation with others 0 Enjoyment of life 0 What TIME of day is your pain at its worst? all Sleep (in general) Fair  Pain is worse with: unsure and some activites Pain improves with: rest Relief from Meds: na  Mobility use a wheelchair needs help with transfers  Function disabled: date disabled .  Neuro/Psych numbness tingling trouble walking spasms  Prior Studies Any changes since last visit?  no  Physicians involved in your care Any changes since last visit?  no   Family History  Problem Relation Age of Onset  . Stroke Maternal Uncle   . Stroke Maternal Uncle    Social History   Socioeconomic History  . Marital status: Legally Separated    Spouse name: Not on file  . Number of children: Not on file  . Years of education: Not on file  . Highest education level: Not on file  Occupational History  . Not on file  Social Needs  . Financial resource strain: Not on file  . Food insecurity:    Worry: Not on file    Inability: Not on file  . Transportation needs:    Medical: Not on file    Non-medical: Not on file  Tobacco Use  . Smoking status: Never Smoker  . Smokeless tobacco: Never Used  Substance and  Sexual Activity  . Alcohol use: Not Currently    Frequency: Never  . Drug use: Never  . Sexual activity: Not on file  Lifestyle  . Physical activity:    Days per week: Not on file    Minutes per session: Not on file  . Stress: Not on file  Relationships  . Social connections:    Talks on phone: Not on file    Gets together: Not on file    Attends religious service: Not on file    Active member of club or organization: Not on file    Attends meetings of clubs or organizations: Not on file    Relationship status: Not on file  Other Topics Concern  . Not on file  Social History Narrative   Single.   4 children.    Once worked as an Teacher, adult education.    Enjoys reading.    Past Surgical History:  Procedure Laterality Date  . CRANIECTOMY  2017  . CRANIOPLASTY  2018  . IVC FILTER INSERTION  2017  . PEG TUBE PLACEMENT  2017  . VENTRICULOPERITONEAL SHUNT  2018   Past Medical History:  Diagnosis Date  . Asthma   . Cardiomegaly 04/13/2016  . Depression   . DVT (deep venous thrombosis) (HCC)   . GERD (gastroesophageal reflux disease)   .  Hemiplegia and hemiparesis    Left side  . Hypertension   . Neurogenic bladder   . Neurogenic bowel   . Neuropathy   . Pulmonary embolism (HCC) 01/13/2016  . Seizures (HCC)   . Stroke (HCC) 12/31/2015   left side deficits   . Subluxation of shoulder joint 04/13/2016   left  . UTI (urinary tract infection)    BP 116/80   Pulse 68   Ht 5\' 10"  (1.778 m) Comment: stated  Wt 192 lb (87.1 kg) Comment: stated  SpO2 96%   BMI 27.55 kg/m   Opioid Risk Score:   Fall Risk Score:  `1  Depression screen PHQ 2/9  Depression screen PHQ 2/9 11/15/2017  Decreased Interest 1  Down, Depressed, Hopeless 1  PHQ - 2 Score 2  Altered sleeping 3  Tired, decreased energy 3  Change in appetite 0  Feeling bad or failure about yourself  3  Trouble concentrating 1  Moving slowly or fidgety/restless 1  Suicidal thoughts 0  PHQ-9 Score 13  Difficult  doing work/chores Not difficult at all     Review of Systems  Constitutional: Negative.   HENT: Negative.   Eyes: Negative.   Respiratory: Negative.   Cardiovascular: Negative.   Gastrointestinal: Negative.   Endocrine: Negative.   Genitourinary: Negative.   Musculoskeletal: Positive for gait problem and myalgias.  Skin: Negative.   Allergic/Immunologic: Negative.   Neurological: Positive for facial asymmetry, numbness and headaches.  Hematological: Negative.   Psychiatric/Behavioral: Negative.   All other systems reviewed and are negative.      Objective:   Physical Exam Vitals signs and nursing note reviewed.  Constitutional:      Appearance: Normal appearance.  HENT:     Head: Normocephalic and atraumatic.     Mouth/Throat:     Mouth: Mucous membranes are moist.  Eyes:     Extraocular Movements: Extraocular movements intact.     Conjunctiva/sclera: Conjunctivae normal.     Pupils: Pupils are equal, round, and reactive to light.  Neurological:     General: No focal deficit present.     Mental Status: He is alert and oriented to person, place, and time.     Cranial Nerves: Facial asymmetry present. No dysarthria.     Motor: Abnormal muscle tone present. No seizure activity.     Comments: Left facial weakness Gait not tested secondary to not having his assistive device  Psychiatric:        Mood and Affect: Mood normal.        Behavior: Behavior normal.    Motor strength is 0/5 in the left upper limb Motor strength is 3- in the left knee extensors 0 at the ankle dorsiflexor Tone MAS 2 left pectoralis MAS 1 in the left FPL MAS 1 in the left FDP FDS MAS 2 in the left hamstrings      Assessment & Plan:  1.  Left spastic hemiplegia excellent improvement in tone in the left finger flexors.  Moderate improvement in the pectoralis as well as the hamstrings. Will write for home health PT to work on ambulation now that his hamstring tone has improved Return to clinic  in 6 weeks for repeat injection same muscle groups and dosing We could consider Dysport to allow for overall higher dosing and perhaps increase the dose in the pectoralis and the hamstrings

## 2018-03-26 NOTE — Telephone Encounter (Signed)
Noted, please notify Patrick Franklin. I can sign another DNR form if needed.

## 2018-03-26 NOTE — Patient Instructions (Signed)
Home health PT ordered

## 2018-03-26 NOTE — Telephone Encounter (Signed)
Message left for patient's sister to return my call.

## 2018-03-28 NOTE — Telephone Encounter (Signed)
Place the DNR in Reddell Clark's inbox

## 2018-03-28 NOTE — Telephone Encounter (Signed)
Patrick Franklin called back   She would like to have 2 DNR signed.   Please advise when ready for pick up Best number [phone]

## 2018-03-29 NOTE — Telephone Encounter (Signed)
Spoken to patient's sister. Notified that DNRs are ready for pick up. Left in the front office.

## 2018-03-29 NOTE — Telephone Encounter (Signed)
Completed and placed in Chans inbox. 

## 2018-04-03 ENCOUNTER — Telehealth: Payer: Self-pay | Admitting: *Deleted

## 2018-04-03 NOTE — Telephone Encounter (Signed)
Tresa Endo PT called to say that Patrick Franklin goes to an adult day care program and is not available for [facility] therapy and will not be admitted to their service.

## 2018-04-04 NOTE — Telephone Encounter (Signed)
I spoke to his sister [name].  She said that it is just 2 days per week and just started 2 weeks ago.  Both she and her husband work jobs and are raising 3 kids and Mr Mcanulty lives with them right now so their transportation is not an option right now.  He is going to be moving into a facility soon (they are new and waiting to open their doors after red tape managed) and he may have some access there. She couldn't remember the name of the new facility at present.

## 2018-04-04 NOTE — Telephone Encounter (Signed)
Does the pt go to [facility]?  If so he can get PT there, if not we can see if he has transportation to OP PT

## 2018-04-06 ENCOUNTER — Other Ambulatory Visit: Payer: Self-pay | Admitting: Primary Care

## 2018-04-06 DIAGNOSIS — I10 Essential (primary) hypertension: Secondary | ICD-10-CM

## 2018-05-03 ENCOUNTER — Other Ambulatory Visit: Payer: Self-pay | Admitting: Primary Care

## 2018-05-03 DIAGNOSIS — E785 Hyperlipidemia, unspecified: Secondary | ICD-10-CM

## 2018-05-06 ENCOUNTER — Other Ambulatory Visit: Payer: Self-pay

## 2018-05-06 ENCOUNTER — Ambulatory Visit
Admission: EM | Admit: 2018-05-06 | Discharge: 2018-05-06 | Disposition: A | Discharge status: Home / Self Care | Payer: 59 | Attending: Family Medicine | Admitting: Family Medicine

## 2018-05-06 ENCOUNTER — Telehealth: Payer: 59 | Admitting: Family

## 2018-05-06 DIAGNOSIS — R319 Hematuria, unspecified: Secondary | ICD-10-CM

## 2018-05-06 DIAGNOSIS — R109 Unspecified abdominal pain: Secondary | ICD-10-CM | POA: Diagnosis not present

## 2018-05-06 DIAGNOSIS — N390 Urinary tract infection, site not specified: Secondary | ICD-10-CM | POA: Diagnosis not present

## 2018-05-06 LAB — URINALYSIS, COMPLETE (UACMP) WITH MICROSCOPIC
Bilirubin Urine: NEGATIVE
Glucose, UA: NEGATIVE mg/dL
Ketones, ur: NEGATIVE mg/dL
Nitrite: NEGATIVE
Protein, ur: 100 mg/dL — AB
Specific Gravity, Urine: 1.02 (ref 1.005–1.030)
pH: 5.5 (ref 5.0–8.0)

## 2018-05-06 MED ORDER — ONDANSETRON 8 MG PO TBDP: 8.0000 mg | ORAL_TABLET | Freq: Three times a day (TID) | ORAL | 0 refills | Status: DC | PRN

## 2018-05-06 MED ORDER — CEPHALEXIN 500 MG PO CAPS: 500.0000 mg | ORAL_CAPSULE | Freq: Two times a day (BID) | ORAL | 0 refills | Status: DC

## 2018-05-06 MED ORDER — TAMSULOSIN HCL 0.4 MG PO CAPS: 0.4000 mg | ORAL_CAPSULE | Freq: Every day | ORAL | 0 refills | Status: DC

## 2018-05-06 MED ORDER — HYDROCODONE-ACETAMINOPHEN 5-325 MG PO TABS: ORAL_TABLET | ORAL | 0 refills | Status: DC

## 2018-05-06 NOTE — Progress Notes (Signed)
Based on what you shared with me, I feel your condition warrants further evaluation and I recommend that you be seen for a face to face office visit.     NOTE: If you entered your credit card information for this eVisit, you will not be charged. You may see a "hold" on your card for the $35 but that hold will drop off and you will not have a charge processed.  If you are having a true medical emergency please call 911.  If you need an urgent face to face visit, [facility] has four urgent care centers for your convenience.    PLEASE NOTE: THE [facility] LOCATIONS AND URGENT CARE CLINICS DO NOT HAVE THE TESTING FOR CORONAVIRUS COVID19 AVAILABLE.  IF YOU FEEL YOU NEED THIS TEST YOU MUST GO TO A TRIAGE LOCATION AT ONE OF THE HOSPITAL EMERGENCY DEPARTMENTS   [email or web address] to reserve your spot online an avoid wait times  [facility] [address], [address] [place], [place] [address] Modified hours of operation: Monday-Friday, 10 AM to 6 PM   Saturday & Sunday 10 AM to 4 PM *Across the street from [employer]  [employer] [employer] (New Address!) [address], [address] [place], [place] [address] *Just off [address], across the road from [place]* Modified hours of operation: Monday-Friday, 10 AM to 5 PM   Closed Saturday & Sunday   The following sites will take your insurance:   [facility]  [phone] Get Driving Directions Find a Provider at this Location  [address] [place], [place] [address]  10 am to 8 pm Monday-Friday  12 pm to 8 pm [facility] at [facility]  [phone] Get Driving Directions Find a Provider at this Location  [address] [place], [place] [address]  8 am to 8 pm Monday-Friday  9 am to 6 pm Saturday  11 am to 6 pm Sunday    [facility] at [facility]  [phone] Get Driving Directions  [address] [place], [place] [address]  8 am to 8 pm Monday-Friday  8 am to 4 pm Saturday-Sunday   Your e-visit answers were reviewed by a board certified advanced clinical practitioner to complete your personal care plan.  Thank you for using e-Visits.

## 2018-05-06 NOTE — ED Provider Notes (Signed)
[facility]    CSN: [id number] Arrival date & time: 05/06/18  1147     History   Chief Complaint Chief Complaint  Patient presents with   Hematuria   Flank Pain    HPI Patrick Franklin is a 48 y.o. male.   48 yo male with a c/o 2-3 days h/o left flank pain and bladder cramping. Noticed blood in urine last week but seemed to clear up. Has a prior h/o kidney stones. Denies any fevers, chills, vomiting.    Hematuria   Flank Pain     Past Medical History:  Diagnosis Date   Asthma    Cardiomegaly 04/13/2016   Depression    DVT (deep venous thrombosis) (HCC)    GERD (gastroesophageal reflux disease)    Hemiplegia and hemiparesis    Left side   Hypertension    Neurogenic bladder    Neurogenic bowel    Neuropathy    Pulmonary embolism (HCC) 01/13/2016   Seizures (HCC)    Stroke (HCC) 12/31/2015   left side deficits    Subluxation of shoulder joint 04/13/2016   left   UTI (urinary tract infection)     Patient Active Problem List   Diagnosis Date Noted   Spastic hemiplegia affecting nondominant side (HCC) 03/26/2018   Impaired decision making 08/30/2017   Aorto-iliac atherosclerosis (HCC) 08/29/2017   Left nephrolithiasis 08/22/2017   Right lower quadrant abdominal pain 08/09/2017   Seizure disorder (HCC) 08/09/2017   Hemiparesis affecting left side as late effect of stroke (HCC) 08/09/2017   Essential hypertension 05/15/2017   Urinary retention 05/15/2017   Hyperlipidemia 05/15/2017   Hx of ischemic right MCA stroke 05/15/2017   MDD (major depressive disorder) 05/15/2017    Past Surgical History:  Procedure Laterality Date   CRANIECTOMY  2017   CRANIOPLASTY  2018   IVC FILTER INSERTION  2017   PEG TUBE PLACEMENT  2017   VENTRICULOPERITONEAL SHUNT  2018       Home Medications    Prior to Admission medications   Medication Sig Start Date End Date Taking? Authorizing Provider  aspirin EC 81 MG  tablet Take 81 mg by mouth daily.    [provider]  atorvastatin (LIPITOR) 80 MG tablet TAKE 1 TABLET BY MOUTH AT BEDTIME 05/03/18   Doreene Nestlark, Katherine K, NP  baclofen (LIORESAL) 20 MG tablet Take 1 tablet (20 mg total) by mouth 2 (two) times daily. 01/29/18   Van ClinesAquino, Karen M, MD  cephALEXin (KEFLEX) 500 MG capsule Take 1 capsule (500 mg total) by mouth 2 (two) times daily. 05/06/18   Payton Mccallumonty, Arayna Illescas, MD  Cholecalciferol (VITAMIN D3) 5000 units CAPS Take 5,000 Units by mouth daily.    [provider]  clonazePAM (KLONOPIN) 0.5 MG disintegrating tablet Administer 1 tablet as needed for seizure. 11/09/17   Van ClinesAquino, Karen M, MD  dantrolene (DANTRIUM) 100 MG capsule Take 1 tablet every night 11/09/17   Van ClinesAquino, Karen M, MD  divalproex (DEPAKOTE ER) 250 MG 24 hr tablet Take 1 tab in AM, 2 tabs in PM 01/11/18   Van ClinesAquino, Karen M, MD  doxazosin (CARDURA) 1 MG tablet Take 1 tablet by mouth once daily for urinary retention. 05/15/17   Doreene Nestlark, Katherine K, NP  DULoxetine (CYMBALTA) 60 MG capsule Take 60 mg by mouth daily.    [provider]  HYDROcodone-acetaminophen (NORCO/VICODIN) 5-325 MG tablet 1-2 tabs po bid prn 05/06/18   Payton Mccallumonty, Derisha Funderburke, MD  ibuprofen (ADVIL,MOTRIN) 200 MG tablet Take 200 mg by  mouth every 6 (six) hours as needed.    [provider]  lacosamide (VIMPAT) 200 MG TABS tablet Take 1 tablet (200 mg total) by mouth 2 (two) times daily. 11/09/17   Van Clines, MD  Melatonin 3 MG TABS Take by mouth.    [provider]  metoprolol tartrate (LOPRESSOR) 25 MG tablet TAKE 1 TABLET BY MOUTH TWICE A DAY 04/06/18   Doreene Nest, NP  Multiple Vitamins-Minerals (MENS MULTIVITAMIN PO) Take by mouth.    [provider]  ondansetron (ZOFRAN ODT) 8 MG disintegrating tablet Take 1 tablet (8 mg total) by mouth every 8 (eight) hours as needed. 05/06/18   Payton Mccallum, MD  tamsulosin (FLOMAX) 0.4 MG CAPS capsule Take 1 capsule (0.4 mg total) by mouth daily.  05/06/18   Payton Mccallum, MD    Family History Family History  Problem Relation Age of Onset   Stroke Maternal Uncle    Stroke Maternal Uncle     Social History Social History   Tobacco Use   Smoking status: Never Smoker   Smokeless tobacco: Never Used  Substance Use Topics   Alcohol use: Not Currently    Frequency: Never   Drug use: Never     Allergies   Versed [midazolam]   Review of Systems Review of Systems  Genitourinary: Positive for flank pain and hematuria.     Physical Exam Triage Vital Signs ED Triage Vitals  Enc Vitals Group     BP 05/06/18 1156 132/90     Pulse Rate 05/06/18 1156 63     Resp 05/06/18 1156 16     Temp 05/06/18 1156 (!) 97.5 F (36.4 C)     Temp Source 05/06/18 1156 Oral     SpO2 05/06/18 1156 99 %     Weight 05/06/18 1158 192 lb (87.1 kg)     Height 05/06/18 1158 5\' 10"  (1.778 m)     Head Circumference --      Peak Flow --      Pain Score 05/06/18 1158 4     Pain Loc --      Pain Edu? --      Excl. in GC? --    No data found.  Updated Vital Signs BP 132/90 (BP Location: Right Arm)    Pulse 63    Temp (!) 97.5 F (36.4 C) (Oral)    Resp 16    Ht 5\' 10"  (1.778 m)    Wt 87.1 kg    SpO2 99%    BMI 27.55 kg/m   Visual Acuity Right Eye Distance:   Left Eye Distance:   Bilateral Distance:    Right Eye Near:   Left Eye Near:    Bilateral Near:     Physical Exam Vitals signs and nursing note reviewed.  Constitutional:      General: He is not in acute distress.    Appearance: He is not toxic-appearing or diaphoretic.  Abdominal:     General: There is no distension.  Neurological:     Mental Status: He is alert.      UC Treatments / Results  Labs (all labs ordered are listed, but only abnormal results are displayed) Labs Reviewed  URINALYSIS, COMPLETE (UACMP) WITH MICROSCOPIC - Abnormal; Notable for the following components:      Result Value   APPearance HAZY (*)    Hgb urine dipstick MODERATE (*)     Protein, ur 100 (*)    Leukocytes,Ua TRACE (*)  Bacteria, UA FEW (*)    All other components within normal limits  URINE CULTURE    EKG None  Radiology No results found.  Procedures Procedures (including critical care time)  Medications Ordered in UC Medications - No data to display  Initial Impression / Assessment and Plan / UC Course  I have reviewed the triage vital signs and the nursing notes.  Pertinent labs & imaging results that were available during my care of the patient were reviewed by me and considered in my medical decision making (see chart for details).      Final Clinical Impressions(s) / UC Diagnoses   Final diagnoses:  Lower urinary tract infectious disease  Left flank pain  Hematuria, unspecified type    ED Prescriptions    Medication Sig Dispense Auth. Provider   cephALEXin (KEFLEX) 500 MG capsule Take 1 capsule (500 mg total) by mouth 2 (two) times daily. 14 capsule Lamoine Fredricksen, MD   ondansetron (ZOFRAN ODT) 8 MG disintegrating tablet Take 1 tablet (8 mg total) by mouth every 8 (eight) hours as needed. 6 tablet Payton Mccallum, MD   tamsulosin (FLOMAX) 0.4 MG CAPS capsule Take 1 capsule (0.4 mg total) by mouth daily. 15 capsule Dyllen Menning, Pamala Hurry, MD   HYDROcodone-acetaminophen (NORCO/VICODIN) 5-325 MG tablet 1-2 tabs po bid prn 8 tablet Payton Mccallum, MD     1. Lab results and diagnosis reviewed with patient/parent/guardian/family 2. rx as per orders above; reviewed possible side effects, interactions, risks and benefits  3. Recommend supportive treatment with increased water intake  4. Follow-up prn if symptoms worsen or don't improve   Controlled Substance Prescriptions [place] Controlled Substance Registry consulted? Not Applicable   Payton Mccallum, MD 05/06/18 1511

## 2018-05-06 NOTE — ED Triage Notes (Signed)
Pt with one week of left flank pain and bladder cramping. Hx of 8 kidney stones. Had hematuria last week but has cleared. No fever. Pain 4/10. Having frequency. Neurogenic bladder.

## 2018-05-07 ENCOUNTER — Ambulatory Visit: Payer: Self-pay | Admitting: Physical Medicine & Rehabilitation

## 2018-05-07 LAB — URINE CULTURE: Culture: NO GROWTH

## 2018-05-15 ENCOUNTER — Encounter: Payer: Self-pay | Admitting: Neurology

## 2018-05-15 ENCOUNTER — Other Ambulatory Visit: Payer: Self-pay

## 2018-05-15 ENCOUNTER — Encounter: Payer: Self-pay | Admitting: Primary Care

## 2018-05-15 ENCOUNTER — Ambulatory Visit: Payer: Self-pay | Admitting: Physical Medicine & Rehabilitation

## 2018-05-15 ENCOUNTER — Telehealth (INDEPENDENT_AMBULATORY_CARE_PROVIDER_SITE_OTHER): Payer: 59 | Admitting: Neurology

## 2018-05-15 DIAGNOSIS — G40409 Other generalized epilepsy and epileptic syndromes, not intractable, without status epilepticus: Secondary | ICD-10-CM

## 2018-05-15 DIAGNOSIS — Z8673 Personal history of transient ischemic attack (TIA), and cerebral infarction without residual deficits: Secondary | ICD-10-CM | POA: Diagnosis not present

## 2018-05-15 MED ORDER — DANTROLENE SODIUM 100 MG PO CAPS: ORAL_CAPSULE | ORAL | 3 refills | Status: DC

## 2018-05-15 MED ORDER — CLONAZEPAM 0.5 MG PO TBDP: ORAL_TABLET | ORAL | 5 refills | Status: DC

## 2018-05-15 MED ORDER — DIVALPROEX SODIUM ER 250 MG PO TB24: ORAL_TABLET | ORAL | 3 refills | Status: DC

## 2018-05-15 MED ORDER — LACOSAMIDE 200 MG PO TABS: 200.0000 mg | ORAL_TABLET | Freq: Two times a day (BID) | ORAL | 3 refills | Status: DC

## 2018-05-15 MED ORDER — BACLOFEN 20 MG PO TABS: 20.0000 mg | ORAL_TABLET | Freq: Two times a day (BID) | ORAL | 3 refills | Status: DC

## 2018-05-15 NOTE — Progress Notes (Signed)
Virtual Visit via Video Note The purpose of this virtual visit is to provide medical care while limiting exposure to the novel coronavirus.    Consent was obtained for video visit:  Yes.   Answered questions that patient had about telehealth interaction:  Yes.   I discussed the limitations, risks, security and privacy concerns of performing an evaluation and management service by telemedicine. I also discussed with the patient that there may be a patient responsible charge related to this service. The patient expressed understanding and agreed to proceed.  Pt location: Home Physician Location: office Name of referring provider:  Doreene Nest, NP I connected with Patrick Franklin at patients initiation/request on 05/15/2018 at  2:30 PM EDT by video enabled telemedicine application and verified that I am speaking with the correct person using two identifiers. 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 His sister is present during the e-visit to provide additional information. Since his last visit, [name] contacted our office to report an increase in seizures in November. Depakote dose increased to 250mg  in AM, 500mg  in PM. He is also on Vimpat 200mg  BID. There has been some reduction in seizures, none in January, 2 in February, 1 in March, and so far one this month last 4/19. The one in March was strong and did not need prn clonazepam. The most recent seizure may have been triggered by infection, he had hematuria and went to Urgent Care last week due to incontinence with low back pain. He was treated with antibiotics and passed a kidney stone. No side effects on medications. Mood is pretty good, [name] agrees and feels things are better. He started going to a day program in [place] and seeing a counselor, these have  been on hold due to current Covid-10 pandemic. Sleep is good. His main concern today are the daily headaches that usually occur in the evening, lasting 2-3 minutes over the right side of his head. No nausea/vomiting, he describes it "like getting a brain freeze." He lays down and headache goes away. No neck pain. He has occasional tremors in his right hand and needles sensation that goes away when he shifts his arm. No falls. He is on Baclofen 20mg  BID and Dantrolene 100mg  qhs for muscle spasms, prior attempt to stop Dantrolene caused more aching in left arm/leg.   History on Initial Assessment 05/01/2017: This is a pleasant 48 year old right-handed man with a history of right MCA stroke s/p hemicraniectomy with subsequent seizures, presenting to establish care. He and his sister report that he had a stroke in December 2017, then has had 3 seizures since then. The first occurred summer 2018 with no prior warning, he woke up with sore muscles, tongue bite and urinary incontinence. The second seizure occurred in February 2019 in his sleep where his wife reported extremities were extended with tongue bite. The most recent one was last 04/23/17 witnessed by his family. His sister reports they were eating then he slumped forward and arms stiffened with shaking for 2 minutes, left arm was drawn up. He was unresponsive with head turn to the left. After 2-3 minutes, he would calm down, then start having movements but able to answer their questions. He had a lot of vomiting after both seizures. He reports a lot of pressure behind  his eyes when he had the seizure. He was brought to [facility] ER where CBC and CMP were unremarkable. I personally reviewed head CT without contrast which showed severe right MCA encephalomalacia, left parietal approach shunt catheter tip in left lateral ventricle, no hydrocephalus. There was a hyperdense focus in the right lateral ventricle, ex vacuo rightward midline shift. He was continued on Vimpat  100mg  BID. He denies any missed medications or sleep deprivation, but there has been a lot of stress with moving to live with his sister and brother-in-law away from his wife. They have a sitter with him during the day while his family is working.   He has pressure behind his eyes on a daily basis. Eye exam reported as normal. He has some neck pain. He has residual weakness on the left side, denies any numbness/tingling or pain. He denies any olfactory/gustatory hallucinations, deja vu, rising epigastric sensation, myoclonic jerks. Mood is okay. His sister has noticed he stares off at times, but replies when called. Family fills his pillbox for him. He is also taking Ritalin prescribed at rehab because he was having attention difficulties and needed prompting to initiate activities, not progressing with therapy. They feel this has been very helpful for him, his brother-in-law noticed a difference in interaction when he ran out of medication, he was less interactive. He states he does not take it all the time. There is a family history of stroke. No family history of seizures.  Epilepsy Risk Factors:  Complete right MCA territory encephalomalacia s/p right hemicraniectomy. Otherwise he had a normal birth and early development.  There is no history of febrile convulsions, CNS infections such as meningitis/encephalitis, or family history of seizures.    Observations/Objective:   Vitals:   05/15/18 1307  Height: 5\' 10"  (1.778 m)   Patient is awake, alert, oriented x 3. He is in better spirits today. He has mild dysarthria, no aphasia. Cranial nerves: Extraocular movements intact with no nystagmus. Left facial asymmetry. Motor: antigravity on right UE and LE, 2/5 left UE, left ankle in brace, at least antigravity with left LE. No incoordination on finger to nose testing on right. Gait not tested. No tremor on right today.   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 There has been some reduction in seizures with increase in Depakote dose, we discussed increasing Depakote ER 250mg  to 2 tabs BID.  He was advised to monitor if this helps with headache prophylaxis as well. Continue Vimpat 200mg  BID. He has prn clonazepam for breakthrough seizure. Continue Baclofen 20mg  BID and Dantrolene 100mg  qhs for spasticity/muscle spasms. He does not drive. He will follow-up in 6 months and knows to call for any changes.  Follow Up Instructions:   -I discussed the assessment and treatment plan with the patient. The patient was provided an opportunity to ask questions and all were answered. The patient agreed with the plan and demonstrated an understanding of the instructions.   The patient was advised to call back or seek an in-person evaluation if the symptoms worsen or if the condition fails to improve as anticipated.    Van Clines, MD

## 2018-05-22 ENCOUNTER — Ambulatory Visit: Payer: 59 | Admitting: Neurology

## 2018-05-24 ENCOUNTER — Ambulatory Visit: Payer: Self-pay | Admitting: Physical Medicine & Rehabilitation

## 2018-05-25 ENCOUNTER — Ambulatory Visit: Payer: Self-pay | Admitting: Physical Medicine & Rehabilitation

## 2018-06-02 ENCOUNTER — Other Ambulatory Visit: Payer: Self-pay | Admitting: Primary Care

## 2018-06-02 DIAGNOSIS — I10 Essential (primary) hypertension: Secondary | ICD-10-CM

## 2018-06-02 DIAGNOSIS — R339 Retention of urine, unspecified: Secondary | ICD-10-CM

## 2018-06-26 ENCOUNTER — Other Ambulatory Visit: Payer: Self-pay

## 2018-06-26 MED ORDER — DIVALPROEX SODIUM ER 500 MG PO TB24: ORAL_TABLET | ORAL | 3 refills | Status: DC

## 2018-06-26 NOTE — Addendum Note (Signed)
Addended by: Van Clines on: 06/26/2018 12:37 PM   Modules accepted: Orders

## 2018-07-02 MED ORDER — DIVALPROEX SODIUM ER 500 MG PO TB24: ORAL_TABLET | ORAL | 3 refills | Status: DC

## 2018-07-23 ENCOUNTER — Encounter: Payer: Self-pay | Admitting: Primary Care

## 2018-07-23 ENCOUNTER — Other Ambulatory Visit: Payer: Self-pay

## 2018-07-23 ENCOUNTER — Ambulatory Visit (INDEPENDENT_AMBULATORY_CARE_PROVIDER_SITE_OTHER): Payer: Medicare Other | Admitting: Primary Care

## 2018-07-23 VITALS — BP 120/76 | HR 78 | Temp 98.0°F | Wt 198.8 lb

## 2018-07-23 DIAGNOSIS — R339 Retention of urine, unspecified: Secondary | ICD-10-CM

## 2018-07-23 DIAGNOSIS — I708 Atherosclerosis of other arteries: Secondary | ICD-10-CM

## 2018-07-23 DIAGNOSIS — G40909 Epilepsy, unspecified, not intractable, without status epilepticus: Secondary | ICD-10-CM

## 2018-07-23 DIAGNOSIS — E785 Hyperlipidemia, unspecified: Secondary | ICD-10-CM

## 2018-07-23 DIAGNOSIS — G8110 Spastic hemiplegia affecting unspecified side: Secondary | ICD-10-CM

## 2018-07-23 DIAGNOSIS — I10 Essential (primary) hypertension: Secondary | ICD-10-CM | POA: Diagnosis not present

## 2018-07-23 DIAGNOSIS — Z0000 Encounter for general adult medical examination without abnormal findings: Secondary | ICD-10-CM | POA: Diagnosis not present

## 2018-07-23 DIAGNOSIS — I69354 Hemiplegia and hemiparesis following cerebral infarction affecting left non-dominant side: Secondary | ICD-10-CM

## 2018-07-23 DIAGNOSIS — F3342 Major depressive disorder, recurrent, in full remission: Secondary | ICD-10-CM | POA: Diagnosis not present

## 2018-07-23 DIAGNOSIS — I700 Atherosclerosis of aorta: Secondary | ICD-10-CM

## 2018-07-23 DIAGNOSIS — Z87442 Personal history of urinary calculi: Secondary | ICD-10-CM

## 2018-07-23 NOTE — Assessment & Plan Note (Signed)
Doing well on Cymbalta 60 mg, no longer seeing therapy. Doing better since dose increase of Depakote. Continue to monitor.

## 2018-07-23 NOTE — Assessment & Plan Note (Signed)
Stable in the office today, continue metoprolol tartrate.

## 2018-07-23 NOTE — Assessment & Plan Note (Signed)
Compliant to statin and aspirin therapy. Continue same.

## 2018-07-23 NOTE — Assessment & Plan Note (Signed)
Influenza vaccination due this Fall. Encouraged a healthy diet and continued strengthening/exercise.  Exam stable. Labs pending. All recommendations provided at end of visit.  I have personally reviewed and have noted: 1. The patient's medical and social history 2. Their use of alcohol, tobacco or illicit drugs 3. Their current medications and supplements 4. The patient's functional ability including ADL's, fall risks, home  safety risks and hearing or visual impairment. 5. Diet and physical activities 6. Evidence for depression or mood disorder

## 2018-07-23 NOTE — Assessment & Plan Note (Signed)
Following with neurology, last seizure was 06/30/18. Has had several seizures since early 2020.  Continue with increased dose of Depakote ER as prescribed, Vimpat, and clonazepam PRN. Doing well on dantrolene 100 mg HS.

## 2018-07-23 NOTE — Assessment & Plan Note (Signed)
Doing well overall with Cardura, continue same.

## 2018-07-23 NOTE — Assessment & Plan Note (Signed)
Compliant to atorvastatin 80 mg, continue same. Repeat lipids pending.

## 2018-07-23 NOTE — Assessment & Plan Note (Signed)
No recent stones. Encouraged proper hydration.  Continue to monitor.

## 2018-07-23 NOTE — Progress Notes (Signed)
Patient ID: Patrick Franklin, male   DOB: 1970/06/13, 48 y.o.   MRN: [id number]  HPI: Patrick Franklin is a 48 year old male who presents today for MWV.  Past Medical History:  Diagnosis Date   Asthma    Cardiomegaly 04/13/2016   Depression    DVT (deep venous thrombosis) (HCC)    GERD (gastroesophageal reflux disease)    Hemiplegia and hemiparesis    Left side   Hypertension    Neurogenic bladder    Neurogenic bowel    Neuropathy    Pulmonary embolism ([place]) 01/13/2016   Seizures ([place])    Stroke ([place]) 12/31/2015   left side deficits    Subluxation of shoulder joint 04/13/2016   left   UTI (urinary tract infection)     Current Outpatient Medications  Medication Sig Dispense Refill   aspirin EC 81 MG tablet Take 81 mg by mouth daily.     atorvastatin (LIPITOR) 80 MG tablet TAKE 1 TABLET BY MOUTH AT BEDTIME 90 tablet 1   baclofen (LIORESAL) 20 MG tablet Take 1 tablet (20 mg total) by mouth 2 (two) times daily. 180 each 3   Cholecalciferol (VITAMIN D3) 5000 units CAPS Take 5,000 Units by mouth daily.     clonazePAM (KLONOPIN) 0.5 MG disintegrating tablet Administer 1 tablet as needed for seizure. 10 tablet 5   dantrolene (DANTRIUM) 100 MG capsule Take 1 tablet every night 90 capsule 3   divalproex (DEPAKOTE ER) 500 MG 24 hr tablet Take 3 tabs twice a day 540 tablet 3   doxazosin (CARDURA) 1 MG tablet TAKE 1 TABLET BY MOUTH ONCE DAILY FOR URINARY RETENTION 90 tablet 1   DULoxetine (CYMBALTA) 60 MG capsule Take 60 mg by mouth daily.     ibuprofen (ADVIL,MOTRIN) 200 MG tablet Take 200 mg by mouth every 6 (six) hours as needed.     lacosamide (VIMPAT) 200 MG TABS tablet Take 1 tablet (200 mg total) by mouth 2 (two) times daily. 180 tablet 3   Melatonin 3 MG TABS Take by mouth.     metoprolol tartrate (LOPRESSOR) 25 MG tablet TAKE 1 TABLET BY MOUTH TWICE A DAY 180 tablet 1   Multiple Vitamins-Minerals (MENS MULTIVITAMIN PO) Take by mouth.     ondansetron  (ZOFRAN ODT) 8 MG disintegrating tablet Take 1 tablet (8 mg total) by mouth every 8 (eight) hours as needed. 6 tablet 0   No current facility-administered medications for this visit.     Allergies  Allergen Reactions   Versed [Midazolam]     Family History  Problem Relation Age of Onset   Stroke Maternal Uncle    Stroke Maternal Uncle     Social History   Socioeconomic History   Marital status: Divorced    Spouse name: Not on file   Number of children: Not on file   Years of education: Not on file   Highest education level: Not on file  Occupational History   Not on file  Social Needs   Financial resource strain: Not on file   Food insecurity    Worry: Not on file    Inability: Not on file   Transportation needs    Medical: Not on file    Non-medical: Not on file  Tobacco Use   Smoking status: Never Smoker   Smokeless tobacco: Never Used  Substance and Sexual Activity   Alcohol use: Not Currently    Frequency: Never   Drug use: Never   Sexual activity: Not on  file  Lifestyle   Physical activity    Days per week: Not on file    Minutes per session: Not on file   Stress: Not on file  Relationships   Social connections    Talks on phone: Not on file    Gets together: Not on file    Attends religious service: Not on file    Active member of club or organization: Not on file    Attends meetings of clubs or organizations: Not on file    Relationship status: Not on file   Intimate partner violence    Fear of current or ex partner: Not on file    Emotionally abused: Not on file    Physically abused: Not on file    Forced sexual activity: Not on file  Other Topics Concern   Not on file  Social History Narrative   Single.   4 children.    Once worked as an Teacher, adult educationestimator.    Enjoys reading.     Hospitiliaztions: None  Health Maintenance:    Flu: Due this season   Tetanus: Unsure  Providers: Vernona RiegerKatherine Jeannene Tschetter, PCP; Dr. Karel JarvisAquino, Neurology;  Dr. Dutch QuintPoole, Neurosurgery.    I have personally reviewed and have noted: 1. The patient's medical and social history 2. Their use of alcohol, tobacco or illicit drugs 3. Their current medications and supplements 4. The patient's functional ability including ADL's, fall risks, home  safety risks and hearing or visual impairment. 5. Diet and physical activities 6. Evidence for depression or mood disorder  Subjective:   Review of Systems:   Constitutional: Denies fever, malaise, fatigue, headache or abrupt weight changes.  HEENT: Denies eye pain, eye redness, ear pain, ringing in the ears, wax buildup, runny nose, nasal congestion, bloody nose, or sore throat. Respiratory: Denies difficulty breathing, shortness of breath, cough or sputum production.   Cardiovascular: Denies chest pain, chest tightness, palpitations or swelling in the hands or feet.  Gastrointestinal: Denies abdominal pain, bloating, constipation, diarrhea or blood in the stool.  GU: Denies urgency, frequency, pain with urination, burning sensation, blood in urine, odor or discharge. Musculoskeletal: Denies falls. Chronic left sided hemiparesis since stroke.  Skin: Denies redness, rashes. Intermittent boil to right posterior thigh.  Neurological: Denies dizziness, difficulty with memory. Psychiatric: Denies concerns for anxiety or depression.   No other specific complaints in a complete review of systems (except as listed in HPI above).  Objective:  PE:   BP 120/76    Pulse 78    Temp 98 F (36.7 C) (Tympanic)    Wt 198 lb 12 oz (90.2 kg)    SpO2 98%    BMI 28.52 kg/m  Wt Readings from Last 3 Encounters:  07/23/18 198 lb 12 oz (90.2 kg)  05/06/18 192 lb (87.1 kg)  03/26/18 192 lb (87.1 kg)    General: Appears their stated age, well developed, well nourished in NAD. Skin: Warm, dry and intact. No rashes, lesions or ulcerations noted. HEENT: Head: normal shape and size; Eyes: sclera white, no icterus, conjunctiva  pink, PERRLA and EOMs intact; Ears: Tm's gray and intact, normal light reflex Neck: Normal range of motion. Neck supple, trachea midline. Cardiovascular: Normal rate and rhythm. S1,S2 noted.  No murmur noted. Pulmonary/Chest: Normal effort and positive vesicular breath sounds. No respiratory distress. No wheezes, rales or ronchi noted.  Abdomen: Soft and nontender. Normal bowel sounds, no bruits noted. No distention or masses noted. Liver, spleen and kidneys non palpable. Musculoskeletal: Normal range of motion.  No signs of joint swelling. Wheelchair bound mostly Neurological: Alert and oriented. Cranial nerves II-XII intact. Coordination normal. +DTRs bilaterally. Psychiatric: Mood and affect normal. Behavior is normal. Judgment and thought content normal.    BMET    Component Value Date/Time   NA 143 05/15/2017 1458   K 4.2 05/15/2017 1458   CL 110 05/15/2017 1458   CO2 27 05/15/2017 1458   GLUCOSE 88 05/15/2017 1458   BUN 16 05/15/2017 1458   CREATININE 0.67 05/15/2017 1458   CALCIUM 9.0 05/15/2017 1458   GFRNONAA >60 04/23/2017 1730   GFRAA >60 04/23/2017 1730    Lipid Panel     Component Value Date/Time   CHOL 91 05/15/2017 1458   TRIG 120.0 05/15/2017 1458   HDL 36.60 (L) 05/15/2017 1458   CHOLHDL 2 05/15/2017 1458   VLDL 24.0 05/15/2017 1458   LDLCALC 30 05/15/2017 1458    CBC    Component Value Date/Time   WBC 6.9 04/23/2017 1730   RBC 5.20 04/23/2017 1730   HGB 14.3 04/23/2017 1730   HCT 45.5 04/23/2017 1730   PLT 173 04/23/2017 1730   MCV 87.5 04/23/2017 1730   MCH 27.5 04/23/2017 1730   MCHC 31.4 04/23/2017 1730   RDW 13.9 04/23/2017 1730   LYMPHSABS 2.1 04/23/2017 1730   MONOABS 0.5 04/23/2017 1730   EOSABS 0.2 04/23/2017 1730   BASOSABS 0.0 04/23/2017 1730    Hgb A1C No results found for: HGBA1C    Assessment and Plan:   Medicare Annual Wellness Visit:  Diet: He endorses a healthy diet including fruit, vegetables, lean protein. Some fried  foods. Daily sweets.  Beverages: Flavored water, green tea Physical activity: Sedentary, some walking Depression/mood screen: Negative Hearing: Intact to whispered voice Visual acuity: Grossly normal, completed one year ago ADLs: Capable Fall risk: None Home safety: Good Cognitive evaluation: Intact to orientation, naming, recall and repetition EOL planning: Adv directives, full code/ I agree  Preventative Medicine: Influenza vaccination due this Fall. Encouraged a healthy diet and continued strengthening/exercise.  Exam stable. Labs pending. All recommendations provided at end of visit.  Next appointment: One year

## 2018-07-23 NOTE — Patient Instructions (Addendum)
Stop by the lab prior to leaving today. I will notify you of your results once received.   Continue to work on regular exercise and a healthy diet.   Ensure you are consuming 64 ounces of water daily.  It was a pleasure to see you today!

## 2018-07-23 NOTE — Assessment & Plan Note (Signed)
Chronic, stable.  Some improvement in left lower extremity.  Recommended regular stretching/exercise.

## 2018-07-23 NOTE — Assessment & Plan Note (Signed)
Chronic, doing well with dantrolene and baclofen. Following with neurology, continue same.

## 2018-07-24 LAB — LIPID PANEL
Cholesterol: 94 mg/dL (ref 0–200)
HDL: 37.3 mg/dL — ABNORMAL LOW (ref >39.00)
LDL Cholesterol: 40 mg/dL (ref 0–99)
NonHDL: 56.45
Total CHOL/HDL Ratio: 3
Triglycerides: 80 mg/dL (ref 0.0–149.0)
VLDL: 16 mg/dL (ref 0.0–40.0)

## 2018-07-24 LAB — COMPREHENSIVE METABOLIC PANEL
ALT: 48 U/L (ref 0–53)
AST: 31 U/L (ref 0–37)
Albumin: 3.8 g/dL (ref 3.5–5.2)
Alkaline Phosphatase: 81 U/L (ref 39–117)
BUN: 12 mg/dL (ref 6–23)
CO2: 31 mEq/L (ref 19–32)
Calcium: 8.7 mg/dL (ref 8.4–10.5)
Chloride: 108 mEq/L (ref 96–112)
Creatinine, Ser: 0.78 mg/dL (ref 0.40–1.50)
GFR: 106.88 mL/min (ref >60.00)
Glucose, Bld: 112 mg/dL — ABNORMAL HIGH (ref 70–99)
Potassium: 4.3 mEq/L (ref 3.5–5.1)
Sodium: 145 mEq/L (ref 135–145)
Total Bilirubin: 0.4 mg/dL (ref 0.2–1.2)
Total Protein: 6 g/dL (ref 6.0–8.3)

## 2018-08-06 ENCOUNTER — Telehealth (INDEPENDENT_AMBULATORY_CARE_PROVIDER_SITE_OTHER): Payer: Medicare Other

## 2018-08-06 DIAGNOSIS — R350 Frequency of micturition: Secondary | ICD-10-CM

## 2018-08-06 NOTE — Telephone Encounter (Signed)
I would say both.  Let's have him bring in a specimen for testing. Have them reach out to Dr. Delice Lesch.

## 2018-08-06 NOTE — Telephone Encounter (Signed)
See MyChart message from Dr Delice Lesch... Dr Delice Lesch decreased the Depakote from 1000 mg. He is having a decline in his overall condition. Incontinent, confused, not sleeping well including not falling asleep and when does sleep he has dreams of being in [facility]. Wonders if he could have a UTI or does she need to reach out to Dr Delice Lesch.

## 2018-08-07 ENCOUNTER — Other Ambulatory Visit: Payer: Medicare Other

## 2018-08-07 DIAGNOSIS — R350 Frequency of micturition: Secondary | ICD-10-CM

## 2018-08-07 LAB — POC URINALSYSI DIPSTICK (AUTOMATED)
Bilirubin, UA: NEGATIVE
Blood, UA: NEGATIVE
Glucose, UA: NEGATIVE
Ketones, UA: NEGATIVE
Leukocytes, UA: NEGATIVE
Nitrite, UA: NEGATIVE
Protein, UA: NEGATIVE
Spec Grav, UA: 1.01 (ref 1.010–1.025)
Urobilinogen, UA: 0.2 E.U./dL
pH, UA: 6.5 (ref 5.0–8.0)

## 2018-08-07 NOTE — Telephone Encounter (Signed)
Patient's sister brought back the urine sample. Sent the results to [facility] and order a urine culture.

## 2018-08-07 NOTE — Addendum Note (Signed)
Addended by: Jacqualin Combes on: 08/07/2018 03:03 PM   Modules accepted: Orders

## 2018-08-07 NOTE — Telephone Encounter (Signed)
Spoken and notified patient's sister of Tawni Millers comments. Patient's sister verbalized understanding. Left urine cup and wipes up front for pick up.

## 2018-08-09 LAB — URINE CULTURE
MICRO NUMBER:: 665058
Result:: NO GROWTH
SPECIMEN QUALITY:: ADEQUATE

## 2018-08-21 ENCOUNTER — Other Ambulatory Visit: Payer: Self-pay | Admitting: Primary Care

## 2018-08-21 DIAGNOSIS — F3342 Major depressive disorder, recurrent, in full remission: Secondary | ICD-10-CM

## 2018-08-21 NOTE — Telephone Encounter (Signed)
Noted.  Refill sent to pharmacy.

## 2018-08-21 NOTE — Telephone Encounter (Signed)
Have not prescribed Rx. Last appointment on   07/23/2018. No future appointment

## 2018-08-28 ENCOUNTER — Telehealth: Payer: Self-pay | Admitting: Primary Care

## 2018-08-28 NOTE — Telephone Encounter (Signed)
Place form/paperwork in [place] for review and complete if necessary.

## 2018-08-28 NOTE — Telephone Encounter (Signed)
[name] sister dropped off forms to be filled out for [employer]. Placed in [place] tower.

## 2018-08-30 NOTE — Telephone Encounter (Signed)
Will defer to PCP upon her return.

## 2018-09-06 NOTE — Telephone Encounter (Signed)
[name] sister Almyra Free dropped off paperwork to see if it has been completed. I told her that someone will call when the form is ready to be picked up. [phone]

## 2018-09-06 NOTE — Telephone Encounter (Signed)
Please notify patient's sister that I completed the form as best as I could. I did not address the eye exam or the "Global Assessment of Functioning section. Dr. Delice Lesch may be able to provide them with this information. Placed in [place].

## 2018-09-07 NOTE — Telephone Encounter (Signed)
Spoken and notified Almyra Free of Anda Kraft Clark's comments.Almyra Free verbalized understanding. Left paperwork in the front office for pick up.

## 2018-10-01 ENCOUNTER — Other Ambulatory Visit: Payer: Self-pay

## 2018-10-01 ENCOUNTER — Inpatient Hospital Stay (HOSPITAL_COMMUNITY): Payer: Medicare Other

## 2018-10-01 ENCOUNTER — Encounter (HOSPITAL_COMMUNITY): Payer: Self-pay | Admitting: Emergency Medicine

## 2018-10-01 ENCOUNTER — Emergency Department (HOSPITAL_COMMUNITY): Payer: Medicare Other

## 2018-10-01 ENCOUNTER — Inpatient Hospital Stay (HOSPITAL_COMMUNITY)
Admission: EM | Admit: 2018-10-01 | Discharge: 2018-10-05 | DRG: 064 | Disposition: A | Discharge status: Home / Self Care | Payer: Medicare Other | Attending: Internal Medicine | Admitting: Internal Medicine

## 2018-10-01 DIAGNOSIS — Z20828 Contact with and (suspected) exposure to other viral communicable diseases: Secondary | ICD-10-CM | POA: Diagnosis present

## 2018-10-01 DIAGNOSIS — I69398 Other sequelae of cerebral infarction: Secondary | ICD-10-CM | POA: Diagnosis not present

## 2018-10-01 DIAGNOSIS — Z79899 Other long term (current) drug therapy: Secondary | ICD-10-CM | POA: Diagnosis not present

## 2018-10-01 DIAGNOSIS — F3342 Major depressive disorder, recurrent, in full remission: Secondary | ICD-10-CM | POA: Diagnosis not present

## 2018-10-01 DIAGNOSIS — K219 Gastro-esophageal reflux disease without esophagitis: Secondary | ICD-10-CM | POA: Diagnosis present

## 2018-10-01 DIAGNOSIS — R4182 Altered mental status, unspecified: Secondary | ICD-10-CM | POA: Diagnosis not present

## 2018-10-01 DIAGNOSIS — G40901 Epilepsy, unspecified, not intractable, with status epilepticus: Secondary | ICD-10-CM | POA: Diagnosis present

## 2018-10-01 DIAGNOSIS — Z66 Do not resuscitate: Secondary | ICD-10-CM | POA: Diagnosis present

## 2018-10-01 DIAGNOSIS — F940 Selective mutism: Secondary | ICD-10-CM | POA: Diagnosis present

## 2018-10-01 DIAGNOSIS — Z982 Presence of cerebrospinal fluid drainage device: Secondary | ICD-10-CM

## 2018-10-01 DIAGNOSIS — N319 Neuromuscular dysfunction of bladder, unspecified: Secondary | ICD-10-CM | POA: Diagnosis present

## 2018-10-01 DIAGNOSIS — J45909 Unspecified asthma, uncomplicated: Secondary | ICD-10-CM | POA: Diagnosis present

## 2018-10-01 DIAGNOSIS — G9341 Metabolic encephalopathy: Secondary | ICD-10-CM | POA: Diagnosis present

## 2018-10-01 DIAGNOSIS — E785 Hyperlipidemia, unspecified: Secondary | ICD-10-CM | POA: Diagnosis present

## 2018-10-01 DIAGNOSIS — I6389 Other cerebral infarction: Secondary | ICD-10-CM | POA: Diagnosis not present

## 2018-10-01 DIAGNOSIS — G40909 Epilepsy, unspecified, not intractable, without status epilepticus: Secondary | ICD-10-CM

## 2018-10-01 DIAGNOSIS — I69354 Hemiplegia and hemiparesis following cerebral infarction affecting left non-dominant side: Secondary | ICD-10-CM | POA: Diagnosis not present

## 2018-10-01 DIAGNOSIS — I63512 Cerebral infarction due to unspecified occlusion or stenosis of left middle cerebral artery: Secondary | ICD-10-CM | POA: Diagnosis not present

## 2018-10-01 DIAGNOSIS — R339 Retention of urine, unspecified: Secondary | ICD-10-CM | POA: Diagnosis present

## 2018-10-01 DIAGNOSIS — Z86718 Personal history of other venous thrombosis and embolism: Secondary | ICD-10-CM

## 2018-10-01 DIAGNOSIS — Z823 Family history of stroke: Secondary | ICD-10-CM

## 2018-10-01 DIAGNOSIS — R402132 Coma scale, eyes open, to sound, at arrival to emergency department: Secondary | ICD-10-CM | POA: Diagnosis present

## 2018-10-01 DIAGNOSIS — R402342 Coma scale, best motor response, flexion withdrawal, at arrival to emergency department: Secondary | ICD-10-CM | POA: Diagnosis present

## 2018-10-01 DIAGNOSIS — I82409 Acute embolism and thrombosis of unspecified deep veins of unspecified lower extremity: Secondary | ICD-10-CM | POA: Diagnosis not present

## 2018-10-01 DIAGNOSIS — R1310 Dysphagia, unspecified: Secondary | ICD-10-CM | POA: Diagnosis present

## 2018-10-01 DIAGNOSIS — Z888 Allergy status to other drugs, medicaments and biological substances status: Secondary | ICD-10-CM

## 2018-10-01 DIAGNOSIS — I10 Essential (primary) hypertension: Secondary | ICD-10-CM | POA: Diagnosis present

## 2018-10-01 DIAGNOSIS — D696 Thrombocytopenia, unspecified: Secondary | ICD-10-CM | POA: Diagnosis present

## 2018-10-01 DIAGNOSIS — I7770 Dissection of unspecified artery: Secondary | ICD-10-CM

## 2018-10-01 DIAGNOSIS — I63413 Cerebral infarction due to embolism of bilateral middle cerebral arteries: Principal | ICD-10-CM | POA: Diagnosis present

## 2018-10-01 DIAGNOSIS — F39 Unspecified mood [affective] disorder: Secondary | ICD-10-CM | POA: Diagnosis not present

## 2018-10-01 DIAGNOSIS — R29818 Other symptoms and signs involving the nervous system: Secondary | ICD-10-CM | POA: Diagnosis present

## 2018-10-01 DIAGNOSIS — R29810 Facial weakness: Secondary | ICD-10-CM | POA: Diagnosis present

## 2018-10-01 DIAGNOSIS — R7989 Other specified abnormal findings of blood chemistry: Secondary | ICD-10-CM | POA: Diagnosis not present

## 2018-10-01 DIAGNOSIS — Z8673 Personal history of transient ischemic attack (TIA), and cerebral infarction without residual deficits: Secondary | ICD-10-CM | POA: Diagnosis not present

## 2018-10-01 DIAGNOSIS — I7771 Dissection of carotid artery: Secondary | ICD-10-CM | POA: Diagnosis present

## 2018-10-01 DIAGNOSIS — R569 Unspecified convulsions: Secondary | ICD-10-CM | POA: Diagnosis not present

## 2018-10-01 DIAGNOSIS — R030 Elevated blood-pressure reading, without diagnosis of hypertension: Secondary | ICD-10-CM | POA: Diagnosis not present

## 2018-10-01 DIAGNOSIS — F329 Major depressive disorder, single episode, unspecified: Secondary | ICD-10-CM | POA: Diagnosis present

## 2018-10-01 DIAGNOSIS — R402232 Coma scale, best verbal response, inappropriate words, at arrival to emergency department: Secondary | ICD-10-CM | POA: Diagnosis present

## 2018-10-01 DIAGNOSIS — I63412 Cerebral infarction due to embolism of left middle cerebral artery: Secondary | ICD-10-CM | POA: Diagnosis not present

## 2018-10-01 DIAGNOSIS — R29725 NIHSS score 25: Secondary | ICD-10-CM | POA: Diagnosis present

## 2018-10-01 DIAGNOSIS — Z7982 Long term (current) use of aspirin: Secondary | ICD-10-CM

## 2018-10-01 DIAGNOSIS — R45851 Suicidal ideations: Secondary | ICD-10-CM | POA: Diagnosis not present

## 2018-10-01 DIAGNOSIS — I639 Cerebral infarction, unspecified: Secondary | ICD-10-CM

## 2018-10-01 DIAGNOSIS — I6340 Cerebral infarction due to embolism of unspecified cerebral artery: Secondary | ICD-10-CM | POA: Insufficient documentation

## 2018-10-01 DIAGNOSIS — G8110 Spastic hemiplegia affecting unspecified side: Secondary | ICD-10-CM | POA: Diagnosis not present

## 2018-10-01 DIAGNOSIS — F0630 Mood disorder due to known physiological condition, unspecified: Secondary | ICD-10-CM | POA: Diagnosis not present

## 2018-10-01 DIAGNOSIS — Z23 Encounter for immunization: Secondary | ICD-10-CM | POA: Diagnosis present

## 2018-10-01 DIAGNOSIS — G441 Vascular headache, not elsewhere classified: Secondary | ICD-10-CM | POA: Diagnosis not present

## 2018-10-01 DIAGNOSIS — Z86711 Personal history of pulmonary embolism: Secondary | ICD-10-CM

## 2018-10-01 HISTORY — DX: Thrombocytopenia, unspecified: D69.6

## 2018-10-01 LAB — DIFFERENTIAL
Abs Immature Granulocytes: 0.03 10*3/uL (ref 0.00–0.07)
Basophils Absolute: 0 10*3/uL (ref 0.0–0.1)
Basophils Relative: 0 %
Eosinophils Absolute: 0.1 10*3/uL (ref 0.0–0.5)
Eosinophils Relative: 1 %
Immature Granulocytes: 1 %
Lymphocytes Relative: 28 %
Lymphs Abs: 1.2 10*3/uL (ref 0.7–4.0)
Monocytes Absolute: 0.5 10*3/uL (ref 0.1–1.0)
Monocytes Relative: 11 %
Neutro Abs: 2.6 10*3/uL (ref 1.7–7.7)
Neutrophils Relative %: 59 %

## 2018-10-01 LAB — CBG MONITORING, ED: Glucose-Capillary: 91 mg/dL (ref 70–99)

## 2018-10-01 LAB — CBC
HCT: 47.9 % (ref 39.0–52.0)
Hemoglobin: 15.8 g/dL (ref 13.0–17.0)
MCH: 29.8 pg (ref 26.0–34.0)
MCHC: 33 g/dL (ref 30.0–36.0)
MCV: 90.2 fL (ref 80.0–100.0)
Platelets: 82 10*3/uL — ABNORMAL LOW (ref 150–400)
RBC: 5.31 MIL/uL (ref 4.22–5.81)
RDW: 15.1 % (ref 11.5–15.5)
WBC: 4.3 10*3/uL (ref 4.0–10.5)
nRBC: 0 % (ref 0.0–0.2)

## 2018-10-01 LAB — COMPREHENSIVE METABOLIC PANEL
ALT: 48 U/L — ABNORMAL HIGH (ref 0–44)
AST: 47 U/L — ABNORMAL HIGH (ref 15–41)
Albumin: 3.3 g/dL — ABNORMAL LOW (ref 3.5–5.0)
Alkaline Phosphatase: 77 U/L (ref 38–126)
Anion gap: 6 (ref 5–15)
BUN: 15 mg/dL (ref 6–20)
CO2: 28 mmol/L (ref 22–32)
Calcium: 8.7 mg/dL — ABNORMAL LOW (ref 8.9–10.3)
Chloride: 109 mmol/L (ref 98–111)
Creatinine, Ser: 1 mg/dL (ref 0.61–1.24)
GFR calc Af Amer: >60 mL/min (ref >60)
GFR calc non Af Amer: >60 mL/min (ref >60)
Glucose, Bld: 112 mg/dL — ABNORMAL HIGH (ref 70–99)
Potassium: 4.3 mmol/L (ref 3.5–5.1)
Sodium: 143 mmol/L (ref 135–145)
Total Bilirubin: 0.8 mg/dL (ref 0.3–1.2)
Total Protein: 5.6 g/dL — ABNORMAL LOW (ref 6.5–8.1)

## 2018-10-01 LAB — PROTIME-INR
INR: 1 (ref 0.8–1.2)
Prothrombin Time: 13.3 seconds (ref 11.4–15.2)

## 2018-10-01 LAB — SARS CORONAVIRUS 2 BY RT PCR (HOSPITAL ORDER, PERFORMED IN ~~LOC~~ HOSPITAL LAB): SARS Coronavirus 2: NEGATIVE

## 2018-10-01 LAB — I-STAT CHEM 8, ED
BUN: 17 mg/dL (ref 6–20)
Calcium, Ion: 1.13 mmol/L — ABNORMAL LOW (ref 1.15–1.40)
Chloride: 107 mmol/L (ref 98–111)
Creatinine, Ser: 0.9 mg/dL (ref 0.61–1.24)
Glucose, Bld: 101 mg/dL — ABNORMAL HIGH (ref 70–99)
HCT: 45 % (ref 39.0–52.0)
Hemoglobin: 15.3 g/dL (ref 13.0–17.0)
Potassium: 4.2 mmol/L (ref 3.5–5.1)
Sodium: 143 mmol/L (ref 135–145)
TCO2: 28 mmol/L (ref 22–32)

## 2018-10-01 LAB — APTT: aPTT: 30 seconds (ref 24–36)

## 2018-10-01 MED ORDER — ACETAMINOPHEN 160 MG/5ML PO SOLN: 650.0000 mg | ORAL | Status: DC | PRN

## 2018-10-01 MED ORDER — ACETAMINOPHEN 325 MG PO TABS
650.0000 mg | ORAL_TABLET | ORAL | Status: DC | PRN
  Administered 2018-10-04: 650 mg via ORAL
  Filled 2018-10-01: qty 2

## 2018-10-01 MED ORDER — SODIUM CHLORIDE 0.9 % IV SOLN
INTRAVENOUS | Status: DC
  Administered 2018-10-02 – 2018-10-04 (×4): via INTRAVENOUS

## 2018-10-01 MED ORDER — ASPIRIN 300 MG RE SUPP
300.0000 mg | Freq: Every day | RECTAL | Status: DC
  Administered 2018-10-02 – 2018-10-03 (×2): 300 mg via RECTAL
  Filled 2018-10-01 (×2): qty 1

## 2018-10-01 MED ORDER — LORAZEPAM 1 MG PO TABS: 2.0000 mg | ORAL_TABLET | Freq: Once | ORAL | Status: DC

## 2018-10-01 MED ORDER — LEVETIRACETAM IN NACL 500 MG/100ML IV SOLN
500.0000 mg | Freq: Two times a day (BID) | INTRAVENOUS | Status: DC
  Administered 2018-10-02 – 2018-10-03 (×3): 500 mg via INTRAVENOUS
  Filled 2018-10-01 (×3): qty 100

## 2018-10-01 MED ORDER — ASPIRIN 300 MG RE SUPP
600.0000 mg | Freq: Once | RECTAL | Status: AC
  Administered 2018-10-01: 600 mg via RECTAL
  Filled 2018-10-01: qty 2

## 2018-10-01 MED ORDER — SODIUM CHLORIDE 0.9 % IV SOLN
2000.0000 mg | Freq: Once | INTRAVENOUS | Status: AC
  Administered 2018-10-01: 2000 mg via INTRAVENOUS
  Filled 2018-10-01: qty 20

## 2018-10-01 MED ORDER — VALPROATE SODIUM 500 MG/5ML IV SOLN
1000.0000 mg | Freq: Two times a day (BID) | INTRAVENOUS | Status: DC
  Administered 2018-10-01 – 2018-10-04 (×6): 1000 mg via INTRAVENOUS
  Filled 2018-10-01 (×7): qty 10

## 2018-10-01 MED ORDER — IOHEXOL 350 MG/ML SOLN
75.0000 mL | Freq: Once | INTRAVENOUS | Status: AC | PRN
  Administered 2018-10-01: 75 mL via INTRAVENOUS

## 2018-10-01 MED ORDER — ACETAMINOPHEN 650 MG RE SUPP
650.0000 mg | RECTAL | Status: DC | PRN
  Administered 2018-10-03: 650 mg via RECTAL
  Filled 2018-10-01: qty 1

## 2018-10-01 MED ORDER — SODIUM CHLORIDE 0.9 % IV SOLN
100.0000 mg | Freq: Two times a day (BID) | INTRAVENOUS | Status: DC
  Administered 2018-10-01 – 2018-10-04 (×6): 100 mg via INTRAVENOUS
  Filled 2018-10-01 (×7): qty 10

## 2018-10-01 MED ORDER — ASPIRIN 325 MG PO TABS
325.0000 mg | ORAL_TABLET | Freq: Every day | ORAL | Status: DC
  Filled 2018-10-01: qty 1

## 2018-10-01 MED ORDER — STROKE: EARLY STAGES OF RECOVERY BOOK
Freq: Once | Status: AC
  Administered 2018-10-02: 06:00:00
  Filled 2018-10-01: qty 1

## 2018-10-01 MED ORDER — LORAZEPAM 2 MG/ML IJ SOLN
INTRAMUSCULAR | Status: AC
  Administered 2018-10-01: 2 mg
  Filled 2018-10-01: qty 1

## 2018-10-01 NOTE — H&P (Addendum)
History and Physical    Patrick RatelChristopher L Coba WUJ:[id number] DOB: 05/29/1970 DOA: 10/01/2018  PCP: Doreene Nestlark, Katherine K, NP Patient coming from: Home  Chief Complaint: Garbled speech, left-sided gaze  HPI: Patrick RatelChristopher L Franklin is a 48 y.o. 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  History provided by patient's sister at bedside.  Sister states patient has left-sided weakness from his previous stroke where he is not able to use his left arm at all but still has some strength left in his left leg which helps him ambulate with a 4 pronged cane.  He also uses a wheelchair to ambulate and is normally conversant.  States today while sitting down around 4:30 PM patient was holding his head with his right hand and his speech was garbled.  His eyes were deviating to the left.  Per sister, no recent fevers, chills, shortness of breath, or cough.  States patient has a history of neurogenic bladder but does not require self-catheterization.  He is able to urinate on his own but does have incontinence at night.  He normally has no problems swallowing and no problem having bowel movements.  ED Course: Vital signs stable. CT head with no acute changes. CTA head and neck with likely age-indeterminate left ICA dissection with open left MCA. Patient was seen by neurology.  Review of Systems:  All systems reviewed and apart from history of presenting illness, are negative.  Past Medical History:  Diagnosis Date   Asthma    Cardiomegaly 04/13/2016   Depression    DVT (deep venous thrombosis) (HCC)    GERD (gastroesophageal reflux disease)    Hemiplegia and  hemiparesis    Left side   Hypertension    Neurogenic bladder    Neurogenic bowel    Neuropathy    Pulmonary embolism (HCC) 01/13/2016   Seizures (HCC)    Stroke (HCC) 12/31/2015   left side deficits    Subluxation of shoulder joint 04/13/2016   left   UTI (urinary tract infection)     Past Surgical History:  Procedure Laterality Date   CRANIECTOMY  2017   CRANIOPLASTY  2018   IVC FILTER INSERTION  2017   PEG TUBE PLACEMENT  2017   VENTRICULOPERITONEAL SHUNT  2018     reports that he has never smoked. He has never used smokeless tobacco. He reports previous alcohol use. He reports that he does not use drugs.  Allergies  Allergen Reactions   Versed [Midazolam] Other (See Comments)    Drops blood pressure and heart rate    Family History  Problem Relation Age of Onset   Stroke Maternal Uncle    Stroke Maternal Uncle     Prior to Admission medications   Medication Sig Start Date End Date Taking? Authorizing Provider  aspirin EC 81 MG tablet Take 81 mg by mouth daily.    [provider]  atorvastatin (LIPITOR) 80 MG tablet TAKE 1 TABLET BY MOUTH AT BEDTIME 05/03/18   Doreene Nestlark, Katherine K, NP  baclofen (LIORESAL) 20 MG tablet Take 1 tablet (20 mg total) by mouth 2 (two) times daily. 05/15/18   Van ClinesAquino, Karen M, MD  Cholecalciferol (VITAMIN D3) 5000 units CAPS Take 5,000 Units by mouth daily.  [provider]  clonazePAM (KLONOPIN) 0.5 MG disintegrating tablet Administer 1 tablet as needed for seizure. 05/15/18   Van Clines, MD  dantrolene (DANTRIUM) 100 MG capsule Take 1 tablet every night 05/15/18   Van Clines, MD  divalproex (DEPAKOTE ER) 500 MG 24 hr tablet Take 3 tabs twice a day 07/02/18   Van Clines, MD  doxazosin (CARDURA) 1 MG tablet TAKE 1 TABLET BY MOUTH ONCE DAILY FOR URINARY RETENTION 06/04/18   Doreene Nest, NP  DULoxetine (CYMBALTA) 60 MG capsule Take 1 capsule (60 mg total) by mouth daily. For depression. 08/21/18    Doreene Nest, NP  ibuprofen (ADVIL,MOTRIN) 200 MG tablet Take 200 mg by mouth every 6 (six) hours as needed.    [provider]  lacosamide (VIMPAT) 200 MG TABS tablet Take 1 tablet (200 mg total) by mouth 2 (two) times daily. 05/15/18   Van Clines, MD  Melatonin 3 MG TABS Take by mouth.    [provider]  metoprolol tartrate (LOPRESSOR) 25 MG tablet TAKE 1 TABLET BY MOUTH TWICE A DAY 04/06/18   Doreene Nest, NP  Multiple Vitamins-Minerals (MENS MULTIVITAMIN PO) Take by mouth.    [provider]  ondansetron (ZOFRAN ODT) 8 MG disintegrating tablet Take 1 tablet (8 mg total) by mouth every 8 (eight) hours as needed. 05/06/18   Payton Mccallum, MD    Physical Exam: Vitals:   10/01/18 1912 10/01/18 1913 10/01/18 1915 10/01/18 2041  BP: (!) 144/92  (!) 119/105 (!) 121/96  Pulse: 75  71 75  Resp: 13  19 11   Temp:      TempSrc:      SpO2: 95%  96% 96%  Weight:  88.3 kg      Physical Exam  Constitutional: He appears well-developed and well-nourished.  HENT:  Head: Normocephalic and atraumatic.  Eyes: Right eye exhibits no discharge. Left eye exhibits no discharge.  Unable to assess pupils as patient had his eyes shut tightly and did not allow me to lift his eyelid  Neck: Neck supple.  Cardiovascular: Normal rate, regular rhythm and intact distal pulses.  Pulmonary/Chest: Effort normal. No respiratory distress. He has no wheezes.  Abdominal: Soft. Bowel sounds are normal. He exhibits no distension. There is no abdominal tenderness. There is no guarding.  Musculoskeletal:     Comments: +1 pitting edema bilateral lower extremities  Neurological:  Speech incomprehensible Not able to follow commands Strength 5 out of 5 in the right upper extremity and 0 out of 5 in the left upper extremity.  Moving right upper extremity spontaneously. Unable to assess strength in the lower extremities.  No spontaneous movement of lower extremities noted.  Skin:  Skin is warm and dry.     Labs on Admission: I have personally reviewed following labs and imaging studies  CBC: Recent Labs  Lab 10/01/18 1850  WBC 4.3  NEUTROABS 2.6  HGB 15.8   15.3  HCT 47.9   45.0  MCV 90.2  PLT 82*   Basic Metabolic Panel: Recent Labs  Lab 10/01/18 1850  NA 143   143  K 4.3   4.2  CL 109   107  CO2 28  GLUCOSE 112*   101*  BUN 15   17  CREATININE 1.00   0.90  CALCIUM 8.7*   GFR: Estimated Creatinine Clearance: 103.3 mL/min (by C-G formula based on SCr of 1 mg/dL). Liver Function Tests: Recent Labs  Lab 10/01/18 1850  AST 47*  ALT 48*  ALKPHOS 77  BILITOT 0.8  PROT 5.6*  ALBUMIN 3.3*   No results for input(s): LIPASE, AMYLASE in the last 168 hours. No results for input(s): AMMONIA in the last 168 hours. Coagulation Profile: Recent Labs  Lab 10/01/18 1850  INR 1.0   Cardiac Enzymes: No results for input(s): CKTOTAL, CKMB, CKMBINDEX, TROPONINI in the last 168 hours. BNP (last 3 results) No results for input(s): PROBNP in the last 8760 hours. HbA1C: No results for input(s): HGBA1C in the last 72 hours. CBG: Recent Labs  Lab 10/01/18 1846  GLUCAP 91   Lipid Profile: No results for input(s): CHOL, HDL, LDLCALC, TRIG, CHOLHDL, LDLDIRECT in the last 72 hours. Thyroid Function Tests: No results for input(s): TSH, T4TOTAL, FREET4, T3FREE, THYROIDAB in the last 72 hours. Anemia Panel: No results for input(s): VITAMINB12, FOLATE, FERRITIN, TIBC, IRON, RETICCTPCT in the last 72 hours. Urine analysis:    Component Value Date/Time   COLORURINE YELLOW 05/06/2018 1208   APPEARANCEUR HAZY (A) 05/06/2018 1208   APPEARANCEUR Clear 09/01/2017 1453   LABSPEC 1.020 05/06/2018 1208   PHURINE 5.5 05/06/2018 1208   GLUCOSEU NEGATIVE 05/06/2018 1208   HGBUR MODERATE (A) 05/06/2018 1208   BILIRUBINUR Negative 08/07/2018 1410   BILIRUBINUR Negative 09/01/2017 1453   KETONESUR NEGATIVE 05/06/2018 1208   PROTEINUR Negative 08/07/2018 1410    PROTEINUR 100 (A) 05/06/2018 1208   UROBILINOGEN 0.2 08/07/2018 1410   NITRITE Negative 08/07/2018 1410   NITRITE NEGATIVE 05/06/2018 1208   LEUKOCYTESUR Negative 08/07/2018 1410   LEUKOCYTESUR TRACE (A) 05/06/2018 1208    Radiological Exams on Admission: Ct Angio Head W Or Wo Contrast  Result Date: 10/01/2018 CLINICAL DATA:  Code stroke.  Old. EXAM: CT ANGIOGRAPHY HEAD AND NECK TECHNIQUE: Multidetector CT imaging of the head and neck was performed using the standard protocol during bolus administration of intravenous contrast. Multiplanar CT image reconstructions and MIPs were obtained to evaluate the vascular anatomy. Carotid stenosis measurements (when applicable) are obtained utilizing NASCET criteria, using the distal internal carotid diameter as the denominator. CONTRAST:  75mL OMNIPAQUE IOHEXOL 350 MG/ML SOLN COMPARISON:  Prior CT from 06/23/2017. FINDINGS: CT HEAD FINDINGS Brain: Extensive encephalomalacia related to chronic right MCA territory infarcts seen throughout the right cerebral hemisphere, stable from previous. Postoperative changes from overlying right craniotomy. A left frontal approach VP shunt catheter in place with tip terminating near the septum pellucidum. Overall, ventricular size and morphology is relatively similar to previous without hydrocephalus. Ex vacuo dilatation of the right lateral ventricle related to the chronic right frontal encephalomalacia. Dystrophic calcification at the frontal horn of the right lateral ventricle again noted. No acute intracranial hemorrhage. No definite or convincing acute large vessel territory infarct. No mass lesion or extra-axial fluid collection. Chronic 12 mm left-to-right shift of the septum pellucidum related to the right cerebral encephalomalacia noted, stable. Vascular: No hyperdense vessel. Mild calcified atherosclerosis at the skull base. Skull: Prior right hemi craniotomy. Left frontal burr hole with shunt catheter in place. Remote  burr holes seen at the bilateral parietal calvarium related to previous shunt catheters. Scalp soft tissues demonstrate no acute finding. Sinuses: Paranasal sinuses are clear.  No mastoid effusion. Orbits: Left gaze noted. Aspects equals 10. Review of the MIP images confirms the above findings CTA NECK FINDINGS Aortic arch: Visualized aortic arch of normal caliber with normal branch pattern. Minimal plaque about the origin of the great vessels without hemodynamically significant stenosis. Visualized subclavian arteries widely patent. Right carotid system: Right  common and internal carotid arteries widely patent without stenosis, dissection, or occlusion. No significant atheromatous narrowing about the right carotid bifurcation. Right ICA tortuous at the level of the skull base. Left carotid system: Left common carotid artery widely patent from its origin to the bifurcation without stenosis. No significant atheromatous narrowing about the left bifurcation. 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 A radiologic string sign is present. No frank intraluminal thrombus or raised dissection flap identified. Finding is age indeterminate. Distally, left ICA otherwise patent to the skull base without additional stenosis or other vascular abnormality. Vertebral arteries: Left vertebral artery dominant and arises from the left subclavian artery. 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 Right vertebral is diffusely hypoplastic, and is patent within the neck, but nearly occludes at the level of the skull base. Dominant left vertebral artery widely patent without stenosis, dissection, or occlusion. Skeleton: No acute osseous finding. No  discrete lytic or blastic osseous lesions. Other neck: No other acute soft tissue abnormality within the neck. Upper chest: Visualized upper chest demonstrates no acute finding. Partially visualized lungs are grossly clear. Review of the MIP images confirms the above findings CTA HEAD FINDINGS Anterior circulation: Petrous, cavernous, and supraclinoid segments widely patent without hemodynamically significant stenosis. ICA termini well perfused. A1 segments widely patent. Normal anterior communicating artery complex. Anterior cerebral arteries patent to their distal aspects without stenosis. Left M1 widely patent. Normal left MCA bifurcation. Left MCA branches well perfused to their distal aspects. Right MCA hypoplastic and attenuated, consistent with right MCA territory infarct. Negative right MCA bifurcation. No right-sided large vessel occlusion. Posterior circulation: Focal non stenotic plaque noted within the left V4 segment beyond the takeoff of the left PICA. Left V4 segment otherwise widely patent to the vertebrobasilar junction. Left PICA patent. Right vertebral markedly hypoplastic as it courses into the cranial vault, contributing little to the posterior circulation. Right PICA patent. Basilar patent to its distal aspect without stenosis. Superior cerebral arteries patent bilaterally. Both of the posterior cerebral arteries primarily supplied via the basilar and are well perfused to their distal aspects. Venous sinuses: Grossly patent allowing for timing the contrast bolus. Anatomic variants: Dominant left vertebral artery with diffusely hypoplastic right vertebral artery. No intracranial aneurysm. Review of the MIP images confirms the above findings IMPRESSION: CT HEAD IMPRESSION: 1. No acute intracranial abnormality. 2. Aspects equals 10. 3. Chronic encephalomalacia throughout the right MCA territory, consistent with chronic right MCA territory infarct. 4. Left frontal approach VP shunt catheter in  place with tip terminating in the left lateral ventricle. Stable ventricular size and morphology without hydrocephalus. CTA HEAD AND NECK IMPRESSION: 1. Negative CTA for emergent large vessel occlusion. 2. 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 No frank intraluminal thrombus or raised dissection flap identified. 3. No other hemodynamically significant or correctable stenosis identified about the major arterial vasculature of the head and neck. 4. Diffusely hypoplastic and attenuated right MCA, consistent with chronic right MCA territory infarct. Critical Value/emergent results were called by telephone at the time of interpretation on 10/01/2018 at 7:15 pm to Dr. Milon Dikes , who verbally acknowledged these results. Electronically Signed   By: Janell Quiet.D.  On: 10/01/2018 19:58   Ct Angio Neck W Or Wo Contrast  Result Date: 10/01/2018 CLINICAL DATA:  Code stroke.  Old. EXAM: CT ANGIOGRAPHY HEAD AND NECK TECHNIQUE: Multidetector CT imaging of the head and neck was performed using the standard protocol during bolus administration of intravenous contrast. Multiplanar CT image reconstructions and MIPs were obtained to evaluate the vascular anatomy. Carotid stenosis measurements (when applicable) are obtained utilizing NASCET criteria, using the distal internal carotid diameter as the denominator. CONTRAST:  56mL OMNIPAQUE IOHEXOL 350 MG/ML SOLN COMPARISON:  Prior CT from 06/23/2017. FINDINGS: CT HEAD FINDINGS Brain: Extensive encephalomalacia related to chronic right MCA territory infarcts seen throughout the right cerebral hemisphere, stable from previous. Postoperative changes from overlying right craniotomy. A left frontal approach VP shunt catheter in place with tip terminating near the septum pellucidum. Overall, ventricular size and morphology is relatively similar to previous  without hydrocephalus. Ex vacuo dilatation of the right lateral ventricle related to the chronic right frontal encephalomalacia. Dystrophic calcification at the frontal horn of the right lateral ventricle again noted. No acute intracranial hemorrhage. No definite or convincing acute large vessel territory infarct. No mass lesion or extra-axial fluid collection. Chronic 12 mm left-to-right shift of the septum pellucidum related to the right cerebral encephalomalacia noted, stable. Vascular: No hyperdense vessel. Mild calcified atherosclerosis at the skull base. Skull: Prior right hemi craniotomy. Left frontal burr hole with shunt catheter in place. Remote burr holes seen at the bilateral parietal calvarium related to previous shunt catheters. Scalp soft tissues demonstrate no acute finding. Sinuses: Paranasal sinuses are clear.  No mastoid effusion. Orbits: Left gaze noted. Aspects equals 10. Review of the MIP images confirms the above findings CTA NECK FINDINGS Aortic arch: Visualized aortic arch of normal caliber with normal branch pattern. Minimal plaque about the origin of the great vessels without hemodynamically significant stenosis. Visualized subclavian arteries widely patent. Right carotid system: Right common and internal carotid arteries widely patent without stenosis, dissection, or occlusion. No significant atheromatous narrowing about the right carotid bifurcation. Right ICA tortuous at the level of the skull base. Left carotid system: Left common carotid artery widely patent from its origin to the bifurcation without stenosis. No significant atheromatous narrowing about the left bifurcation. 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 A radiologic string sign is present. No frank intraluminal thrombus or raised dissection flap identified. Finding is age  indeterminate. Distally, left ICA otherwise patent to the skull base without additional stenosis or other vascular abnormality. Vertebral arteries: Left vertebral artery dominant and arises from the left subclavian artery. 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 Right vertebral is diffusely hypoplastic, and is patent within the neck, but nearly occludes at the level of the skull base. Dominant left vertebral artery widely patent without stenosis, dissection, or occlusion. Skeleton: No acute osseous finding. No discrete lytic or blastic osseous lesions. Other neck: No other acute soft tissue abnormality within the neck. Upper chest: Visualized upper chest demonstrates no acute finding. Partially visualized lungs are grossly clear. Review of the MIP images confirms the above findings CTA HEAD FINDINGS Anterior circulation: Petrous, cavernous, and supraclinoid segments widely patent without hemodynamically significant stenosis. ICA termini well perfused. A1 segments widely patent. Normal anterior communicating artery complex. Anterior cerebral arteries  patent to their distal aspects without stenosis. Left M1 widely patent. Normal left MCA bifurcation. Left MCA branches well perfused to their distal aspects. Right MCA hypoplastic and attenuated, consistent with right MCA territory infarct. Negative right MCA bifurcation. No right-sided large vessel occlusion. Posterior circulation: Focal non stenotic plaque noted within the left V4 segment beyond the takeoff of the left PICA. Left V4 segment otherwise widely patent to the vertebrobasilar junction. Left PICA patent. Right vertebral markedly hypoplastic as it courses into the cranial vault, contributing little to the posterior circulation. Right PICA patent. Basilar patent to its distal aspect without stenosis.  Superior cerebral arteries patent bilaterally. Both of the posterior cerebral arteries primarily supplied via the basilar and are well perfused to their distal aspects. Venous sinuses: Grossly patent allowing for timing the contrast bolus. Anatomic variants: Dominant left vertebral artery with diffusely hypoplastic right vertebral artery. No intracranial aneurysm. Review of the MIP images confirms the above findings IMPRESSION: CT HEAD IMPRESSION: 1. No acute intracranial abnormality. 2. Aspects equals 10. 3. Chronic encephalomalacia throughout the right MCA territory, consistent with chronic right MCA territory infarct. 4. Left frontal approach VP shunt catheter in place with tip terminating in the left lateral ventricle. Stable ventricular size and morphology without hydrocephalus. CTA HEAD AND NECK IMPRESSION: 1. Negative CTA for emergent large vessel occlusion. 2. 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 No frank intraluminal thrombus or raised dissection flap identified. 3. No other hemodynamically significant or correctable stenosis identified about the major arterial vasculature of the head and neck. 4. Diffusely hypoplastic and attenuated right MCA, consistent with chronic right MCA territory infarct. Critical Value/emergent results were called by telephone at the time of interpretation on 10/01/2018 at 7:15 pm to Dr. Milon DikesASHISH ARORA , who verbally acknowledged these results. Electronically Signed   By: Rise MuBenjamin  McClintock M.D.   On: 10/01/2018 19:58   Dg Chest Port 1 View  Result Date: 10/01/2018 CLINICAL DATA:  Altered mental status EXAM: PORTABLE CHEST 1 VIEW COMPARISON:  None. FINDINGS: Low lung volumes. Left base scarring or atelectasis. Right lung clear. Heart is normal size. No effusions or acute bony abnormality. IMPRESSION: Left base scarring or atelectasis.  Low lung volumes.  Electronically Signed   By: Charlett NoseKevin  Dover M.D.   On: 10/01/2018 22:00   Ct Head Code Stroke Wo Contrast  Result Date: 10/01/2018 CLINICAL DATA:  Code stroke.  Old. EXAM: CT ANGIOGRAPHY HEAD AND NECK TECHNIQUE: Multidetector CT imaging of the head and neck was performed using the standard protocol during bolus administration of intravenous contrast. Multiplanar CT image reconstructions and MIPs were obtained to evaluate the vascular anatomy. Carotid stenosis measurements (when applicable) are obtained utilizing NASCET criteria, using the distal internal carotid diameter as the denominator. CONTRAST:  75mL OMNIPAQUE IOHEXOL 350 MG/ML SOLN COMPARISON:  Prior CT from 06/23/2017. FINDINGS: CT HEAD FINDINGS Brain: Extensive encephalomalacia related to chronic right MCA territory infarcts seen throughout the right cerebral hemisphere, stable from previous. Postoperative changes from overlying right craniotomy. A left frontal approach VP shunt catheter in place with tip terminating near the septum pellucidum. Overall, ventricular size and morphology is relatively similar to previous without hydrocephalus. Ex vacuo dilatation of the right lateral ventricle related to the chronic right frontal encephalomalacia. Dystrophic calcification at the frontal horn of the right lateral ventricle again noted. No acute intracranial hemorrhage. No definite or convincing acute large vessel territory infarct. No mass lesion or extra-axial fluid collection.  Chronic 12 mm left-to-right shift of the septum pellucidum related to the right cerebral encephalomalacia noted, stable. Vascular: No hyperdense vessel. Mild calcified atherosclerosis at the skull base. Skull: Prior right hemi craniotomy. Left frontal burr hole with shunt catheter in place. Remote burr holes seen at the bilateral parietal calvarium related to previous shunt catheters. Scalp soft tissues demonstrate no acute finding. Sinuses: Paranasal sinuses are clear.  No mastoid  effusion. Orbits: Left gaze noted. Aspects equals 10. Review of the MIP images confirms the above findings CTA NECK FINDINGS Aortic arch: Visualized aortic arch of normal caliber with normal branch pattern. Minimal plaque about the origin of the great vessels without hemodynamically significant stenosis. Visualized subclavian arteries widely patent. Right carotid system: Right common and internal carotid arteries widely patent without stenosis, dissection, or occlusion. No significant atheromatous narrowing about the right carotid bifurcation. Right ICA tortuous at the level of the skull base. Left carotid system: Left common carotid artery widely patent from its origin to the bifurcation without stenosis. No significant atheromatous narrowing about the left bifurcation. 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 A radiologic string sign is present. No frank intraluminal thrombus or raised dissection flap identified. Finding is age indeterminate. Distally, left ICA otherwise patent to the skull base without additional stenosis or other vascular abnormality. Vertebral arteries: Left vertebral artery dominant and arises from the left subclavian artery. 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 Right vertebral is diffusely hypoplastic, and is patent within the neck, but nearly occludes at the level of the skull base. Dominant left vertebral artery widely patent without stenosis, dissection, or occlusion. Skeleton: No acute osseous finding. No discrete lytic or blastic osseous lesions. Other neck: No other acute soft tissue abnormality within the neck. Upper chest: Visualized upper chest demonstrates no acute finding. Partially  visualized lungs are grossly clear. Review of the MIP images confirms the above findings CTA HEAD FINDINGS Anterior circulation: Petrous, cavernous, and supraclinoid segments widely patent without hemodynamically significant stenosis. ICA termini well perfused. A1 segments widely patent. Normal anterior communicating artery complex. Anterior cerebral arteries patent to their distal aspects without stenosis. Left M1 widely patent. Normal left MCA bifurcation. Left MCA branches well perfused to their distal aspects. Right MCA hypoplastic and attenuated, consistent with right MCA territory infarct. Negative right MCA bifurcation. No right-sided large vessel occlusion. Posterior circulation: Focal non stenotic plaque noted within the left V4 segment beyond the takeoff of the left PICA. Left V4 segment otherwise widely patent to the vertebrobasilar junction. Left PICA patent. Right vertebral markedly hypoplastic as it courses into the cranial vault, contributing little to the posterior circulation. Right PICA patent. Basilar patent to its distal aspect without stenosis. Superior cerebral arteries patent bilaterally. Both of the posterior cerebral arteries primarily supplied via the basilar and are well perfused to their distal aspects. Venous sinuses: Grossly patent allowing for timing the contrast bolus. Anatomic variants: Dominant left vertebral artery with diffusely hypoplastic right vertebral artery. No intracranial aneurysm. Review of the MIP images confirms the above findings IMPRESSION: CT HEAD IMPRESSION: 1. No acute intracranial abnormality. 2. Aspects equals 10. 3. Chronic encephalomalacia throughout the right MCA territory, consistent with chronic right MCA territory infarct. 4. Left frontal approach VP shunt catheter in place with tip  terminating in the left lateral ventricle. Stable ventricular size and morphology without hydrocephalus. CTA HEAD AND NECK IMPRESSION: 1. Negative CTA for emergent large vessel  occlusion. 2. 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 No frank intraluminal thrombus or raised dissection flap identified. 3. No other hemodynamically significant or correctable stenosis identified about the major arterial vasculature of the head and neck. 4. Diffusely hypoplastic and attenuated right MCA, consistent with chronic right MCA territory infarct. Critical Value/emergent results were called by telephone at the time of interpretation on 10/01/2018 at 7:15 pm to Dr. Amie Portland , who verbally acknowledged these results. Electronically Signed   By: Jeannine Boga M.D.   On: 10/01/2018 19:58    EKG: Independently reviewed.  Sinus rhythm.  Assessment/Plan Principal Problem:   Neurological deficit present Active Problems:   Essential hypertension   Seizure disorder (HCC)   Thrombocytopenia (HCC)   Neurogenic bladder  Neurologic deficits (dysarthria, left gaze preference) Neurology feels his current presentation is more consistent with a right hemispheric seizure/status epilepticus with prolonged postictal state given the left lower gaze preference or deviation rather than a left MCA stroke.  Unclear at the left ICA dissection is old or new.  He is currently on aspirin.  Not a candidate for TPA due to presentation thought to be likely consistent with seizure.  Not a candidate for EVT due to poor baseline modified Rankin.  Stat EEG suggestive of severe diffuse encephalopathy, likely secondary to medication effect and/or post ictal state.  No seizures or epileptiform discharges are seen throughout the recording. -Patient received 2 mg IV Ativan.  Loaded with Keppra 2 g IV x1.  Continue home dose of Depakote and Vimpat. -MRI brain without contrast.  No records of the VP shunt as such MRI is delayed.  [facility] was contacted by the MRI department at [facility] and unfortunately  that hospital does not have any information about the patient's VP shunt surgery.  He has only had 2 ER visits over there.  Patient's sister does not know where his surgery was done.  Discussed with neurology.  Stat CT cerebral perfusion study has been ordered. -Seizure precautions -Neurology recommending checking for infectious etiology including UTI or pneumonia.  UA pending.  Chest x-ray ordered and pending -Telemetry monitoring -Allow for permissive hypertension-treat only if systolic blood pressures are greater than 220. -2D echocardiogram -Hemoglobin A1c, fasting lipid panel -Aspirin 600 mg rectally now ordered by neurology.  Continue aspirin 300 mg rectally starting tomorrow. -Atorvastatin 80 mg daily when patient passes swallow eval -Frequent neurochecks -PT, OT, speech therapy. -N.p.o. until cleared by bedside swallow evaluation or formal speech evaluation  Addendum: CT cerebral perfusion study showing perfusion mismatch throughout the left cerebral hemisphere with elevated cerebral blood flow, cerebral blood volume, with reduced T-max.  This finding is thought to reflect perfusion differences as compared to the contralateral right hemisphere that demonstrates extensive chronic right MCA encephalomalacia, possible superimposed changes related to seizure.  No core infarct within the left cerebral hemisphere seen.  A short interval follow-up noncontrast head CT recommended to evaluate for possible interval change.  CT has been ordered.   Thrombocytopenia Platelet count 82,000, previously normal a year ago.  No signs of active bleeding. -Repeat CBC in a.m. to confirm  Urinary retention, history of neurogenic bladder No urine output in the ED.  Sister reported to nursing staff that patient has not voided since 1300 today.  Per nursing  staff, they are not able to place a condom catheter to monitor urine output as the size of the patient's penis is not compatible with the size of available  condom catheters. -Bladder scan -Foley catheter  Addendum: Followed up with nursing staff on the floor who informed me that patient is having adequate urine output and has soiled the bed.  Advised avoiding Foley unless absolutely needed.  Nursing staff will try putting a condom catheter on so urine output can be monitored.  Hypertension  -Allow permissive hypertension at this time  History of seizure disorder -Continue antiepileptics as mentioned above  Pharmacy med rec pending.  DVT prophylaxis: SCDs given thrombocytopenia Code Status: Discussed with the patient's sister at bedside.  Sister states patient is a DNR and his wishes are listed in his most form which she will bring to the hospital tomorrow.  States patient has previously expressed that he does NOT want CPR or defibrillation and does NOT want intubation or mechanical ventilation.  Patient has also previously expressed to his family that he does NOT want tube feeds but would be okay with IV fluids for a trial period.  Family Communication: Sister at bedside. Disposition Plan: Anticipate discharge after clinical improvement. Consults called: Neurology Admission status: It is my clinical opinion that admission to INPATIENT is reasonable and necessary in this 48 y.o. male  presenting with symptoms of slurred speech and left gaze preference concerning for status epilepticus versus acute CVA.  Neurology following.  EEG ordered.  MRI needs to be done but delayed as patient has a VP shunt.  Given the aforementioned, the predictability of an adverse outcome is felt to be significant. I expect that the patient will require at least 2 midnights in the hospital to treat this condition.   The medical decision making on this patient was of high complexity and the patient is at high risk for clinical deterioration, therefore this is a level 3 visit.  John Giovanni MD [facility] Pager [phone]  If 7PM-7AM, please contact  night-coverage [email or web address] Password [facility]  10/01/2018, 10:32 PM

## 2018-10-01 NOTE — Consult Note (Signed)
Neurology Consultation  Reason for Consult: Code stroke Referring Physician: Dr. Geoffery Lyonsouglas Delo  CC: Garbled speech, left-sided gaze  History is obtained from: Sister, chart  HPI: Patrick Franklin is a 48 y.o. 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  He was with his family, his sister, who I spoke with over the phone and then in person when she arrived in the ER.  She reports that that been out this morning, he was acting like his normal self.  At baseline he uses a 4 pronged cane as well as wheelchair to ambulate and is conversant but around 5:30 PM today, he had sudden onset of garbled speech that made no sense and he had leftward gaze deviation.  EMS was called.  He continued to have a forced left gaze deviation.  By the time he arrived in the ER his gaze had started to come some to the midline but he could not look to the right.  He was moving his right side normally but not to command for EMS. The sister denies any preceding illnesses sicknesses.  She denies fevers chills.  She denies cough shortness of breath.  She denies palpitations chest pain.  She denies abdominal pain nausea vomiting.  She denies easy bleeding or bruising. For the last stroke-that was 2017 December[place]  [facility]  Because of his stroke was not determined.  He was on aspirin 81 mg for prophylaxis.    LKW: 5:30 PM 10/01/2018 tpa given?: no, likely seizure Premorbid modified Rankin scale (mRS): 4 ROS: Unable to do due to patient's altered mental status.  Pertinent positives obtained by the  sister documented in the HPI.  Rest of the review negative.  Past Medical History:  Diagnosis Date   Asthma    Cardiomegaly 04/13/2016   Depression    DVT (deep venous thrombosis) (HCC)    GERD (gastroesophageal reflux disease)    Hemiplegia and hemiparesis    Left side   Hypertension    Neurogenic bladder    Neurogenic bowel    Neuropathy    Pulmonary embolism (HCC) 01/13/2016   Seizures (HCC)    Stroke (HCC) 12/31/2015   left side deficits    Subluxation of shoulder joint 04/13/2016   left   UTI (urinary tract infection)     Family History  Problem Relation Age of Onset   Stroke Maternal Uncle    Stroke Maternal Uncle     Social History:   reports that he has never smoked. He has never used smokeless tobacco. He reports previous alcohol use. He reports that he does not use drugs.  Medications No current facility-administered medications for this encounter.   Current Outpatient Medications:    aspirin EC 81 MG tablet, Take 81 mg by mouth daily., Disp: , Rfl:    atorvastatin (LIPITOR) 80 MG tablet, TAKE 1 TABLET BY MOUTH AT BEDTIME, Disp: 90 tablet, Rfl: 1   baclofen (LIORESAL) 20 MG tablet, Take 1 tablet (20 mg total) by mouth 2 (two) times daily., Disp: 180 each, Rfl: 3   Cholecalciferol (VITAMIN D3) 5000 units CAPS, Take  5,000 Units by mouth daily., Disp: , Rfl:    clonazePAM (KLONOPIN) 0.5 MG disintegrating tablet, Administer 1 tablet as needed for seizure., Disp: 10 tablet, Rfl: 5   dantrolene (DANTRIUM) 100 MG capsule, Take 1 tablet every night, Disp: 90 capsule, Rfl: 3   divalproex (DEPAKOTE ER) 500 MG 24 hr tablet, Take 3 tabs twice a day, Disp: 540 tablet, Rfl: 3   doxazosin (CARDURA) 1 MG tablet, TAKE 1 TABLET BY MOUTH ONCE DAILY FOR URINARY RETENTION, Disp: 90 tablet, Rfl: 1   DULoxetine (CYMBALTA) 60 MG capsule, Take 1 capsule (60 mg total) by mouth daily. For depression., Disp: 90 capsule, Rfl: 3   ibuprofen (ADVIL,MOTRIN) 200 MG  tablet, Take 200 mg by mouth every 6 (six) hours as needed., Disp: , Rfl:    lacosamide (VIMPAT) 200 MG TABS tablet, Take 1 tablet (200 mg total) by mouth 2 (two) times daily., Disp: 180 tablet, Rfl: 3   Melatonin 3 MG TABS, Take by mouth., Disp: , Rfl:    metoprolol tartrate (LOPRESSOR) 25 MG tablet, TAKE 1 TABLET BY MOUTH TWICE A DAY, Disp: 180 tablet, Rfl: 1   Multiple Vitamins-Minerals (MENS MULTIVITAMIN PO), Take by mouth., Disp: , Rfl:    ondansetron (ZOFRAN ODT) 8 MG disintegrating tablet, Take 1 tablet (8 mg total) by mouth every 8 (eight) hours as needed., Disp: 6 tablet, Rfl: 0  Exam: Current vital signs: BP (!) 144/92    Pulse 75    Temp 98.1 F (36.7 C) (Axillary)    Resp 13    Wt 88.3 kg    SpO2 95%    BMI 27.93 kg/m  Vital signs in last 24 hours: Temp:  [98.1 F (36.7 C)] 98.1 F (36.7 C) (09/07 1911) Pulse Rate:  [75] 75 (09/07 1912) Resp:  [13] 13 (09/07 1912) BP: (144)/(92) 144/92 (09/07 1912) SpO2:  [95 %] 95 % (09/07 1912) Weight:  [88.3 kg] 88.3 kg (09/07 1913) General: Patient is awake, no apparent distress HEENT: There is a skull depression on the right frontotemporal region from the past craniectomy, no neck stiffness. CVS: Regular rate rhythm Respiratory: Breathing normally saturating well on room air Extremities: Warm well perfused left foot and AFO. Neurological exam He was awake alert. No following commands. Mumbling some words that are incoherent.   Cranial nerves: Pupils equal, round reactive light.  He has a leftward gaze preference, initially was only able to look up to midline not cross to the right but later crossed over to the right.  Left lower facial weakness which is chronic per the sister.  Did not blink to threat from the left.  Blink to threat from right. Motor exam: Is moving right upper extremity and lower extremity normally.  Is able to hold right upper extremity against gravity for a few seconds but does not follow command-do not  appreciate gross weakness.  No gross weakness noted in the right leg.  Left arm is plegic with increased tone and mild contractures.  Left lower extremity also plegic with contractures. Sensory exam: Grimaces to noxious stimulation on both sides Coordination-difficult to assess due to given mentation NIH stroke scale 1a Level of Conscious.: 0 1b LOC Questions: 2 1c LOC Commands: 2 2 Best Gaze: 1 3 Visual: 2 4 Facial Palsy: 2 5a Motor Arm - left: 3 5b Motor Arm - Right: 0 6a Motor Leg - Left: 3 6b Motor Leg - Right: 1 7 Limb Ataxia: 0 8 Sensory: 0 9 Best Language: 2 10 Dysarthria: 2  11 Extinct. and Inatten.: 1 TOTAL: 21  Labs I have reviewed labs in epic and the results pertinent to this consultation are:  CBC    Component Value Date/Time   WBC 6.9 04/23/2017 1730   RBC 5.20 04/23/2017 1730   HGB 14.3 04/23/2017 1730   HCT 45.5 04/23/2017 1730   PLT 173 04/23/2017 1730   MCV 87.5 04/23/2017 1730   MCH 27.5 04/23/2017 1730   MCHC 31.4 04/23/2017 1730   RDW 13.9 04/23/2017 1730   LYMPHSABS 2.1 04/23/2017 1730   MONOABS 0.5 04/23/2017 1730   EOSABS 0.2 04/23/2017 1730   BASOSABS 0.0 04/23/2017 1730    CMP     Component Value Date/Time   NA 145 07/23/2018 1634   K 4.3 07/23/2018 1634   CL 108 07/23/2018 1634   CO2 31 07/23/2018 1634   GLUCOSE 112 (H) 07/23/2018 1634   BUN 12 07/23/2018 1634   CREATININE 0.78 07/23/2018 1634   CALCIUM 8.7 07/23/2018 1634   PROT 6.0 07/23/2018 1634   ALBUMIN 3.8 07/23/2018 1634   AST 31 07/23/2018 1634   ALT 48 07/23/2018 1634   ALKPHOS 81 07/23/2018 1634   BILITOT 0.4 07/23/2018 1634   GFRNONAA >60 04/23/2017 1730   GFRAA >60 04/23/2017 1730    Lipid Panel     Component Value Date/Time   CHOL 94 07/23/2018 1634   TRIG 80.0 07/23/2018 1634   HDL 37.30 (L) 07/23/2018 1634   CHOLHDL 3 07/23/2018 1634   VLDL 16.0 07/23/2018 1634   LDLCALC 40 07/23/2018 1634     Imaging I have reviewed the images obtained:  CT-scan  of the brain-no acute changes.  Large right MCA territory encephalomalacia, VP shunt in place. CTA head and neck reviewed with neuroradiology.  Questionable age-indeterminate left ICA dissection in the lower neck.  MCA branches open on the left.  Diminutive right MCA branches, likely chronic.   Assessment: Mr. Pia MauChristopher Staff is a 48 year old man who has a past medical history of large right MCA stroke status post craniectomy at that time, status post VP shunt with residual left hemiplegia, and is you state of health till 530 this morning when he had sudden onset of garbled speech and left-sided gaze deviation which is improving. Speech remains garbled.  Gaze is now left gaze preference with ability to look towards the right. Chronic left hemiplegia and left facial droop noticed. CT head with no acute changes. CTA head and neck with likely age-indeterminate left ICA dissection with open left MCA. I suspect that his current presentation is more consistent with a right hemispheric seizure/status epilepticus with prolonged postictal state given the leftward gaze preference or deviation rather than a left MCA stroke.  I am not sure if the left ICA dissection is old or new. He is currently on aspirin. MRI would be of more help in determining etiology as well as an EEG.  Not a candidate for TPA due to presentation being likely consistent with seizure Not a candidate for EVT due to poor baseline modified Rankin.  Impression: Evaluate for seizure, status epilepticus Evaluate for stroke Possible left ICA dissection-age-indeterminate  Recommendations: 2 mg of Ativan IV given. Loaded with Keppra 2 g IV x1. Resume home dose of Depakote and Vimpat. Stat EEG-technologist made aware.  Dr. Otelia LimesLindzen will follow up on the read. MRI when possible-no records of the VP shunt in the craniectomy plate hence MRI might be delayed. If the stat EEG is unremarkable for seizure and there remains suspicion for  stroke,  should consider getting information from [place] as soon as possible and getting an MRI brain without contrast if compatible. Maintain seizure precautions Check for any other infectious etiology such as pneumonia or UTI. Discussed in detail with the patient sister at bedside as well as the primary team-EDP. Care signed out to Dr. Cheral Marker.  Neurology will follow. -- Amie Portland, MD [facility] Pager: [phone] If 7pm to 7am, please call on call as listed on AMION.  CRITICAL CARE ATTESTATION Performed by: Amie Portland, MD Total critical care time: 65 minutes Critical care time was exclusive of separately billable procedures and treating other patients and/or supervising APPs/Residents/Students Critical care was necessary to treat or prevent imminent or life-threatening deterioration due to acute encephalopathy, status epilepticus, seizure This patient is critically ill and at significant risk for neurological worsening and/or death and care requires constant monitoring. Critical care was time spent personally by me on the following activities: development of treatment plan with patient and/or surrogate as well as nursing, discussions with consultants, evaluation of patient's response to treatment, examination of patient, obtaining history from patient or surrogate, ordering and performing treatments and interventions, ordering and review of laboratory studies, ordering and review of radiographic studies, pulse oximetry, re-evaluation of patient's condition, participation in multidisciplinary rounds and medical decision making of high complexity in the care of this patient.

## 2018-10-01 NOTE — ED Notes (Signed)
Pt belongings given to sister - shorts, shirt, gait belt, shoes and left leg brace

## 2018-10-01 NOTE — ED Provider Notes (Signed)
[facility] EMERGENCY DEPARTMENT Provider Note   CSN: [id number] Arrival date & time: 10/01/18  1844    History   Chief Complaint Chief Complaint  Patient presents with   Code Stroke    HPI Patrick Franklin is a 48 y.o. male with past medical history significant for asthma, cardiomegaly, DVT, left-sided hemiparesis, hypertension, neurogenic bladder, seizures, CVA presents for evaluation of not feeling well.  Per EMS called out to family's home for patient "not well."  Patient presented under code stroke.  Patient obtained from patient sister.  States when she got home from work patient was grabbing at his head.  At that time he developed garbled speech.  This is 90 minutes PTA.  States patient has left-sided residual deficits from prior CVA however is normally conversational.  Walks with a 4 prong walker.  Does have history of seizures however is been a "long time since he last had one.  States patient was at his normal self prior to 5:30 PM today.  To have left upward gaze at that time.  Throughout his EMS transportation he continued to have left forced gaze however has started to become more midline on arrival to the ED however not able to look to the right.  No recent illnesses.   Last know well--90 minutes PTA.  Level 5 caveat-Altered mental status.     HPI  Past Medical History:  Diagnosis Date   Asthma    Cardiomegaly 04/13/2016   Depression    DVT (deep venous thrombosis) (HCC)    GERD (gastroesophageal reflux disease)    Hemiplegia and hemiparesis    Left side   Hypertension    Neurogenic bladder    Neurogenic bowel    Neuropathy    Pulmonary embolism (HCC) 01/13/2016   Seizures (HCC)    Stroke (HCC) 12/31/2015   left side deficits    Subluxation of shoulder joint 04/13/2016   left   UTI (urinary tract infection)     Patient Active Problem List   Diagnosis Date Noted   Neurological deficit present 10/01/2018   Medicare  annual wellness visit, subsequent 07/23/2018   Spastic hemiplegia affecting nondominant side (HCC) 03/26/2018   Impaired decision making 08/30/2017   Aorto-iliac atherosclerosis (HCC) 08/29/2017   History of renal stone 08/22/2017   Seizure disorder (HCC) 08/09/2017   Hemiparesis affecting left side as late effect of stroke (HCC) 08/09/2017   Essential hypertension 05/15/2017   Urinary retention 05/15/2017   Hyperlipidemia 05/15/2017   Hx of ischemic right MCA stroke 05/15/2017   MDD (major depressive disorder) 05/15/2017    Past Surgical History:  Procedure Laterality Date   CRANIECTOMY  2017   CRANIOPLASTY  2018   IVC FILTER INSERTION  2017   PEG TUBE PLACEMENT  2017   VENTRICULOPERITONEAL SHUNT  2018        Home Medications    Prior to Admission medications   Medication Sig Start Date End Date Taking? Authorizing Provider  aspirin EC 81 MG tablet Take 81 mg by mouth daily.    [provider]  atorvastatin (LIPITOR) 80 MG tablet TAKE 1 TABLET BY MOUTH AT BEDTIME 05/03/18   Doreene Nest, NP  baclofen (LIORESAL) 20 MG tablet Take 1 tablet (20 mg total) by mouth 2 (two) times daily. 05/15/18   Van Clines, MD  Cholecalciferol (VITAMIN D3) 5000 units CAPS Take 5,000 Units by mouth daily.    [provider]  clonazePAM (KLONOPIN) 0.5 MG disintegrating tablet Administer  1 tablet as needed for seizure. 05/15/18   Van ClinesAquino, Karen M, MD  dantrolene (DANTRIUM) 100 MG capsule Take 1 tablet every night 05/15/18   Van ClinesAquino, Karen M, MD  divalproex (DEPAKOTE ER) 500 MG 24 hr tablet Take 3 tabs twice a day 07/02/18   Van ClinesAquino, Karen M, MD  doxazosin (CARDURA) 1 MG tablet TAKE 1 TABLET BY MOUTH ONCE DAILY FOR URINARY RETENTION 06/04/18   Doreene Nestlark, Katherine K, NP  DULoxetine (CYMBALTA) 60 MG capsule Take 1 capsule (60 mg total) by mouth daily. For depression. 08/21/18   Doreene Nestlark, Katherine K, NP  ibuprofen (ADVIL,MOTRIN) 200 MG tablet Take 200 mg by mouth every 6 (six)  hours as needed.    [provider]  lacosamide (VIMPAT) 200 MG TABS tablet Take 1 tablet (200 mg total) by mouth 2 (two) times daily. 05/15/18   Van ClinesAquino, Karen M, MD  Melatonin 3 MG TABS Take by mouth.    [provider]  metoprolol tartrate (LOPRESSOR) 25 MG tablet TAKE 1 TABLET BY MOUTH TWICE A DAY 04/06/18   Doreene Nestlark, Katherine K, NP  Multiple Vitamins-Minerals (MENS MULTIVITAMIN PO) Take by mouth.    [provider]  ondansetron (ZOFRAN ODT) 8 MG disintegrating tablet Take 1 tablet (8 mg total) by mouth every 8 (eight) hours as needed. 05/06/18   Payton Mccallumonty, [place], MD    Family History Family History  Problem Relation Age of Onset   Stroke Maternal Uncle    Stroke Maternal Uncle     Social History Social History   Tobacco Use   Smoking status: Never Smoker   Smokeless tobacco: Never Used  Substance Use Topics   Alcohol use: Not Currently    Frequency: Never   Drug use: Never     Allergies   Versed [midazolam]   Review of Systems Review of Systems  Unable to perform ROS: Acuity of condition  All other systems reviewed and are negative.    Physical Exam Updated Vital Signs BP (!) 121/96 (BP Location: Right Arm)    Pulse 75    Temp 98.1 F (36.7 C) (Axillary)    Resp 11    Wt 88.3 kg    SpO2 96%    BMI 27.93 kg/m   Physical Exam Vitals signs and nursing note reviewed.  Constitutional:      Appearance: Ill appearance: Chronically ill appearing.  HENT:     Head:     Comments: Partial right craniectomy    Nose: Nose normal.     Mouth/Throat:     Mouth: Mucous membranes are moist.  Eyes:     Comments: Leftward gaze which does not cross midline.  Cardiovascular:     Rate and Rhythm: Normal rate.     Pulses: Normal pulses.     Heart sounds: Normal heart sounds.  Pulmonary:     Effort: Pulmonary effort is normal.     Breath sounds: Normal breath sounds.     Comments: Protecting airway Abdominal:     General: Bowel sounds are normal.  There is no distension.  Musculoskeletal:     Comments: Left paralysis at baseline. Able to move right upper and lower extremity.  Skin:    General: Skin is warm.     Capillary Refill: Capillary refill takes less than 2 seconds.     Findings: No lesion or rash.  Neurological:     Comments: Post ictal. Squeezes right hand.  Mumbles incoherent words.  Left lower facial with decreased strength her sister states is at  baseline.  Right lower extremity without weakness.    ED Treatments / Results  Labs (all labs ordered are listed, but only abnormal results are displayed) Labs Reviewed  CBC - Abnormal; Notable for the following components:      Result Value   Platelets 82 (*)    All other components within normal limits  COMPREHENSIVE METABOLIC PANEL - Abnormal; Notable for the following components:   Glucose, Bld 112 (*)    Calcium 8.7 (*)    Total Protein 5.6 (*)    Albumin 3.3 (*)    AST 47 (*)    ALT 48 (*)    All other components within normal limits  I-STAT CHEM 8, ED - Abnormal; Notable for the following components:   Glucose, Bld 101 (*)    Calcium, Ion 1.13 (*)    All other components within normal limits  SARS CORONAVIRUS 2 (HOSPITAL ORDER, PERFORMED IN [facility])  PROTIME-INR  APTT  DIFFERENTIAL  URINALYSIS, ROUTINE W REFLEX MICROSCOPIC  HIV ANTIBODY (ROUTINE TESTING W REFLEX)  HEMOGLOBIN A1C  LIPID PANEL  CBC  BASIC METABOLIC PANEL  CBG MONITORING, ED    EKG None  Radiology Ct Angio Head W Or Wo Contrast  Result Date: 10/01/2018 CLINICAL DATA:  Code stroke.  Old. EXAM: CT ANGIOGRAPHY HEAD AND NECK TECHNIQUE: Multidetector CT imaging of the head and neck was performed using the standard protocol during bolus administration of intravenous contrast. Multiplanar CT image reconstructions and MIPs were obtained to evaluate the vascular anatomy. Carotid stenosis measurements (when applicable) are obtained utilizing NASCET criteria, using the distal  internal carotid diameter as the denominator. CONTRAST:  75mL OMNIPAQUE IOHEXOL 350 MG/ML SOLN COMPARISON:  Prior CT from 06/23/2017. FINDINGS: CT HEAD FINDINGS Brain: Extensive encephalomalacia related to chronic right MCA territory infarcts seen throughout the right cerebral hemisphere, stable from previous. Postoperative changes from overlying right craniotomy. A left frontal approach VP shunt catheter in place with tip terminating near the septum pellucidum. Overall, ventricular size and morphology is relatively similar to previous without hydrocephalus. Ex vacuo dilatation of the right lateral ventricle related to the chronic right frontal encephalomalacia. Dystrophic calcification at the frontal horn of the right lateral ventricle again noted. No acute intracranial hemorrhage. No definite or convincing acute large vessel territory infarct. No mass lesion or extra-axial fluid collection. Chronic 12 mm left-to-right shift of the septum pellucidum related to the right cerebral encephalomalacia noted, stable. Vascular: No hyperdense vessel. Mild calcified atherosclerosis at the skull base. Skull: Prior right hemi craniotomy. Left frontal burr hole with shunt catheter in place. Remote burr holes seen at the bilateral parietal calvarium related to previous shunt catheters. Scalp soft tissues demonstrate no acute finding. Sinuses: Paranasal sinuses are clear.  No mastoid effusion. Orbits: Left gaze noted. Aspects equals 10. Review of the MIP images confirms the above findings CTA NECK FINDINGS Aortic arch: Visualized aortic arch of normal caliber with normal branch pattern. Minimal plaque about the origin of the great vessels without hemodynamically significant stenosis. Visualized subclavian arteries widely patent. Right carotid system: Right common and internal carotid arteries widely patent without stenosis, dissection, or occlusion. No significant atheromatous narrowing about the right carotid bifurcation. Right  ICA tortuous at the level of the skull base. Left carotid system: Left common carotid artery widely patent from its origin to the bifurcation without stenosis. No significant atheromatous narrowing about the left bifurcation. Focal tortuosity of the mid left ICA with associated market vessel irregularity and severe high-grade stenosis is  seen, concerning for arterial dissection (series 8, images 128-117). A radiologic string sign is present. No frank intraluminal thrombus or raised dissection flap identified. Finding is age indeterminate. Distally, left ICA otherwise patent to the skull base without additional stenosis or other vascular abnormality. Vertebral arteries: Left vertebral artery dominant and arises from the left subclavian artery. 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 Right vertebral is diffusely hypoplastic, and is patent within the neck, but nearly occludes at the level of the skull base. Dominant left vertebral artery widely patent without stenosis, dissection, or occlusion. Skeleton: No acute osseous finding. No discrete lytic or blastic osseous lesions. Other neck: No other acute soft tissue abnormality within the neck. Upper chest: Visualized upper chest demonstrates no acute finding. Partially visualized lungs are grossly clear. Review of the MIP images confirms the above findings CTA HEAD FINDINGS Anterior circulation: Petrous, cavernous, and supraclinoid segments widely patent without hemodynamically significant stenosis. ICA termini well perfused. A1 segments widely patent. Normal anterior communicating artery complex. Anterior cerebral arteries patent to their distal aspects without stenosis. Left M1 widely patent. Normal left MCA bifurcation. Left MCA branches well perfused to their distal aspects. Right MCA hypoplastic and attenuated,  consistent with right MCA territory infarct. Negative right MCA bifurcation. No right-sided large vessel occlusion. Posterior circulation: Focal non stenotic plaque noted within the left V4 segment beyond the takeoff of the left PICA. Left V4 segment otherwise widely patent to the vertebrobasilar junction. Left PICA patent. Right vertebral markedly hypoplastic as it courses into the cranial vault, contributing little to the posterior circulation. Right PICA patent. Basilar patent to its distal aspect without stenosis. Superior cerebral arteries patent bilaterally. Both of the posterior cerebral arteries primarily supplied via the basilar and are well perfused to their distal aspects. Venous sinuses: Grossly patent allowing for timing the contrast bolus. Anatomic variants: Dominant left vertebral artery with diffusely hypoplastic right vertebral artery. No intracranial aneurysm. Review of the MIP images confirms the above findings IMPRESSION: CT HEAD IMPRESSION: 1. No acute intracranial abnormality. 2. Aspects equals 10. 3. Chronic encephalomalacia throughout the right MCA territory, consistent with chronic right MCA territory infarct. 4. Left frontal approach VP shunt catheter in place with tip terminating in the left lateral ventricle. Stable ventricular size and morphology without hydrocephalus. CTA HEAD AND NECK IMPRESSION: 1. Negative CTA for emergent large vessel occlusion. 2. 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 No frank intraluminal thrombus or raised dissection flap identified. 3. No other hemodynamically significant or correctable stenosis identified about the major arterial vasculature of the head and neck. 4. Diffusely hypoplastic and attenuated right MCA, consistent with chronic right MCA territory infarct. Critical Value/emergent results were called by telephone at the time of  interpretation on 10/01/2018 at 7:15 pm to Dr. Amie Portland , who verbally acknowledged these results. Electronically Signed   By: Jeannine Boga M.D.   On: 10/01/2018 19:58   Ct Angio Neck W Or Wo Contrast  Result Date: 10/01/2018 CLINICAL DATA:  Code stroke.  Old. EXAM: CT ANGIOGRAPHY HEAD AND NECK TECHNIQUE: Multidetector CT imaging of the head and neck was performed using the standard protocol during bolus administration of intravenous contrast. Multiplanar CT image reconstructions and MIPs were obtained to evaluate the vascular anatomy. Carotid stenosis measurements (when applicable) are obtained utilizing NASCET  criteria, using the distal internal carotid diameter as the denominator. CONTRAST:  75mL OMNIPAQUE IOHEXOL 350 MG/ML SOLN COMPARISON:  Prior CT from 06/23/2017. FINDINGS: CT HEAD FINDINGS Brain: Extensive encephalomalacia related to chronic right MCA territory infarcts seen throughout the right cerebral hemisphere, stable from previous. Postoperative changes from overlying right craniotomy. A left frontal approach VP shunt catheter in place with tip terminating near the septum pellucidum. Overall, ventricular size and morphology is relatively similar to previous without hydrocephalus. Ex vacuo dilatation of the right lateral ventricle related to the chronic right frontal encephalomalacia. Dystrophic calcification at the frontal horn of the right lateral ventricle again noted. No acute intracranial hemorrhage. No definite or convincing acute large vessel territory infarct. No mass lesion or extra-axial fluid collection. Chronic 12 mm left-to-right shift of the septum pellucidum related to the right cerebral encephalomalacia noted, stable. Vascular: No hyperdense vessel. Mild calcified atherosclerosis at the skull base. Skull: Prior right hemi craniotomy. Left frontal burr hole with shunt catheter in place. Remote burr holes seen at the bilateral parietal calvarium related to previous shunt  catheters. Scalp soft tissues demonstrate no acute finding. Sinuses: Paranasal sinuses are clear.  No mastoid effusion. Orbits: Left gaze noted. Aspects equals 10. Review of the MIP images confirms the above findings CTA NECK FINDINGS Aortic arch: Visualized aortic arch of normal caliber with normal branch pattern. Minimal plaque about the origin of the great vessels without hemodynamically significant stenosis. Visualized subclavian arteries widely patent. Right carotid system: Right common and internal carotid arteries widely patent without stenosis, dissection, or occlusion. No significant atheromatous narrowing about the right carotid bifurcation. Right ICA tortuous at the level of the skull base. Left carotid system: Left common carotid artery widely patent from its origin to the bifurcation without stenosis. No significant atheromatous narrowing about the left bifurcation. 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 A radiologic string sign is present. No frank intraluminal thrombus or raised dissection flap identified. Finding is age indeterminate. Distally, left ICA otherwise patent to the skull base without additional stenosis or other vascular abnormality. Vertebral arteries: Left vertebral artery dominant and arises from the left subclavian artery. 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 Right vertebral is diffusely hypoplastic, and is patent within the neck, but nearly occludes at the level of the skull base. Dominant left vertebral artery widely patent without stenosis, dissection, or occlusion. Skeleton: No acute osseous finding. No discrete lytic or blastic osseous lesions. Other neck: No other acute soft tissue  abnormality within the neck. Upper chest: Visualized upper chest demonstrates no acute finding. Partially visualized lungs are grossly clear. Review of the MIP images confirms the above findings CTA HEAD FINDINGS Anterior circulation: Petrous, cavernous, and supraclinoid segments widely patent without hemodynamically significant stenosis. ICA termini well perfused. A1 segments widely patent. Normal anterior communicating artery complex. Anterior cerebral arteries patent to their distal aspects without stenosis. Left M1 widely patent. Normal left MCA bifurcation. Left MCA branches well perfused to their distal aspects. Right MCA hypoplastic and attenuated, consistent with right MCA territory infarct. Negative right MCA bifurcation. No right-sided large vessel occlusion. Posterior circulation: Focal non stenotic plaque noted within the left V4 segment beyond the takeoff of the left PICA. Left V4 segment otherwise widely patent to the vertebrobasilar  junction. Left PICA patent. Right vertebral markedly hypoplastic as it courses into the cranial vault, contributing little to the posterior circulation. Right PICA patent. Basilar patent to its distal aspect without stenosis. Superior cerebral arteries patent bilaterally. Both of the posterior cerebral arteries primarily supplied via the basilar and are well perfused to their distal aspects. Venous sinuses: Grossly patent allowing for timing the contrast bolus. Anatomic variants: Dominant left vertebral artery with diffusely hypoplastic right vertebral artery. No intracranial aneurysm. Review of the MIP images confirms the above findings IMPRESSION: CT HEAD IMPRESSION: 1. No acute intracranial abnormality. 2. Aspects equals 10. 3. Chronic encephalomalacia throughout the right MCA territory, consistent with chronic right MCA territory infarct. 4. Left frontal approach VP shunt catheter in place with tip terminating in the left lateral ventricle. Stable ventricular size and  morphology without hydrocephalus. CTA HEAD AND NECK IMPRESSION: 1. Negative CTA for emergent large vessel occlusion. 2. 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 No frank intraluminal thrombus or raised dissection flap identified. 3. No other hemodynamically significant or correctable stenosis identified about the major arterial vasculature of the head and neck. 4. Diffusely hypoplastic and attenuated right MCA, consistent with chronic right MCA territory infarct. Critical Value/emergent results were called by telephone at the time of interpretation on 10/01/2018 at 7:15 pm to Dr. Milon Dikes , who verbally acknowledged these results. Electronically Signed   By: Rise Mu M.D.   On: 10/01/2018 19:58   Ct Head Code Stroke Wo Contrast  Result Date: 10/01/2018 CLINICAL DATA:  Code stroke.  Old. EXAM: CT ANGIOGRAPHY HEAD AND NECK TECHNIQUE: Multidetector CT imaging of the head and neck was performed using the standard protocol during bolus administration of intravenous contrast. Multiplanar CT image reconstructions and MIPs were obtained to evaluate the vascular anatomy. Carotid stenosis measurements (when applicable) are obtained utilizing NASCET criteria, using the distal internal carotid diameter as the denominator. CONTRAST:  46mL OMNIPAQUE IOHEXOL 350 MG/ML SOLN COMPARISON:  Prior CT from 06/23/2017. FINDINGS: CT HEAD FINDINGS Brain: Extensive encephalomalacia related to chronic right MCA territory infarcts seen throughout the right cerebral hemisphere, stable from previous. Postoperative changes from overlying right craniotomy. A left frontal approach VP shunt catheter in place with tip terminating near the septum pellucidum. Overall, ventricular size and morphology is relatively similar to previous without hydrocephalus. Ex vacuo dilatation of the right lateral ventricle related to the  chronic right frontal encephalomalacia. Dystrophic calcification at the frontal horn of the right lateral ventricle again noted. No acute intracranial hemorrhage. No definite or convincing acute large vessel territory infarct. No mass lesion or extra-axial fluid collection. Chronic 12 mm left-to-right shift of the septum pellucidum related to the right cerebral encephalomalacia noted, stable. Vascular: No hyperdense vessel. Mild calcified atherosclerosis at the skull base. Skull: Prior right hemi craniotomy. Left frontal burr hole with shunt catheter in place. Remote burr holes seen at the bilateral parietal calvarium related to previous shunt catheters. Scalp soft tissues demonstrate no acute finding. Sinuses: Paranasal sinuses are clear.  No mastoid effusion. Orbits: Left gaze noted. Aspects equals 10. Review of the MIP images confirms the above findings CTA NECK FINDINGS Aortic arch: Visualized aortic arch of normal caliber with normal branch pattern. Minimal plaque about the origin of the great vessels without hemodynamically significant stenosis. Visualized subclavian arteries widely patent. Right carotid system: Right common and internal carotid arteries widely patent without stenosis, dissection, or occlusion. No significant atheromatous narrowing about the  right carotid bifurcation. Right ICA tortuous at the level of the skull base. Left carotid system: Left common carotid artery widely patent from its origin to the bifurcation without stenosis. No significant atheromatous narrowing about the left bifurcation. 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 A radiologic string sign is present. No frank intraluminal thrombus or raised dissection flap identified. Finding is age indeterminate. Distally, left ICA otherwise patent to the skull base without additional stenosis or  other vascular abnormality. Vertebral arteries: Left vertebral artery dominant and arises from the left subclavian artery. 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 Right vertebral is diffusely hypoplastic, and is patent within the neck, but nearly occludes at the level of the skull base. Dominant left vertebral artery widely patent without stenosis, dissection, or occlusion. Skeleton: No acute osseous finding. No discrete lytic or blastic osseous lesions. Other neck: No other acute soft tissue abnormality within the neck. Upper chest: Visualized upper chest demonstrates no acute finding. Partially visualized lungs are grossly clear. Review of the MIP images confirms the above findings CTA HEAD FINDINGS Anterior circulation: Petrous, cavernous, and supraclinoid segments widely patent without hemodynamically significant stenosis. ICA termini well perfused. A1 segments widely patent. Normal anterior communicating artery complex. Anterior cerebral arteries patent to their distal aspects without stenosis. Left M1 widely patent. Normal left MCA bifurcation. Left MCA branches well perfused to their distal aspects. Right MCA hypoplastic and attenuated, consistent with right MCA territory infarct. Negative right MCA bifurcation. No right-sided large vessel occlusion. Posterior circulation: Focal non stenotic plaque noted within the left V4 segment beyond the takeoff of the left PICA. Left V4 segment otherwise widely patent to the vertebrobasilar junction. Left PICA patent. Right vertebral markedly hypoplastic as it courses into the cranial vault, contributing little to the posterior circulation. Right PICA patent. Basilar patent to its distal aspect without stenosis. Superior cerebral arteries patent bilaterally. Both of the posterior cerebral arteries primarily supplied  via the basilar and are well perfused to their distal aspects. Venous sinuses: Grossly patent allowing for timing the contrast bolus. Anatomic variants: Dominant left vertebral artery with diffusely hypoplastic right vertebral artery. No intracranial aneurysm. Review of the MIP images confirms the above findings IMPRESSION: CT HEAD IMPRESSION: 1. No acute intracranial abnormality. 2. Aspects equals 10. 3. Chronic encephalomalacia throughout the right MCA territory, consistent with chronic right MCA territory infarct. 4. Left frontal approach VP shunt catheter in place with tip terminating in the left lateral ventricle. Stable ventricular size and morphology without hydrocephalus. CTA HEAD AND NECK IMPRESSION: 1. Negative CTA for emergent large vessel occlusion. 2. 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 No frank intraluminal thrombus or raised dissection flap identified. 3. No other hemodynamically significant or correctable stenosis identified about the major arterial vasculature of the head and neck. 4. Diffusely hypoplastic and attenuated right MCA, consistent with chronic right MCA territory infarct. Critical Value/emergent results were called by telephone at the time of interpretation on 10/01/2018 at 7:15 pm to Dr. Milon Dikes , who verbally acknowledged these results. Electronically Signed   By: Rise Mu M.D.   On: 10/01/2018 19:58    Procedures .Critical Care Performed by: Linwood Dibbles, PA-C Authorized by:  Sharol Croghan A, PA-C   Critical care provider statement:    Critical care time (minutes):  61   Critical care was necessary to treat or prevent imminent or life-threatening deterioration of the following conditions:  Circulatory failure   Critical care was time spent personally by me on the following activities:  Discussions with consultants, evaluation of  patient's response to treatment, examination of patient, ordering and performing treatments and interventions, ordering and review of laboratory studies, ordering and review of radiographic studies, pulse oximetry, re-evaluation of patient's condition, obtaining history from patient or surrogate and review of old charts   (including critical care time)  Medications Ordered in ED Medications  levETIRAcetam (KEPPRA) IVPB 500 mg/100 mL premix (has no administration in time range)  lacosamide (VIMPAT) 100 mg in sodium chloride 0.9 % 25 mL IVPB (100 mg Intravenous New Bag/Given 10/01/18 2114)  valproate (DEPACON) 1,000 mg in dextrose 5 % 50 mL IVPB (1,000 mg Intravenous New Bag/Given 10/01/18 2016)   stroke: mapping our early stages of recovery book (has no administration in time range)  0.9 %  sodium chloride infusion (has no administration in time range)  acetaminophen (TYLENOL) tablet 650 mg (has no administration in time range)    Or  acetaminophen (TYLENOL) solution 650 mg (has no administration in time range)    Or  acetaminophen (TYLENOL) suppository 650 mg (has no administration in time range)  aspirin suppository 300 mg (has no administration in time range)    Or  aspirin tablet 325 mg (has no administration in time range)  iohexol (OMNIPAQUE) 350 MG/ML injection 75 mL (75 mLs Intravenous Contrast Given 10/01/18 1906)  LORazepam (ATIVAN) 2 MG/ML injection (2 mg  Given 10/01/18 1910)  levETIRAcetam (KEPPRA) 2,000 mg in sodium chloride 0.9 % 100 mL IVPB (0 mg Intravenous Stopped 10/01/18 2019)  aspirin suppository 600 mg (600 mg Rectal Given 10/01/18 2126)   Initial Impression / Assessment and Plan / ED Course  I have reviewed the triage vital signs and the nursing notes.  Pertinent labs & imaging results that were available during my care of the patient were reviewed by me and considered in my medical decision making (see chart for details).  Partial arrived under code stroke.  Patient has  significant medical history of her prior CVA with VP shunt and craniectomy.  Evaluated by neurology at the bridge.  Patient immediately to CT scan. 2 mg Ativan in CT for possible seizure with left side gaze.  Possible seizure vs CVA vs dissection  1915: Patient has been advised by Dr. Jerrell Belfast. Patient with possible age-indeterminate dissection.  Will need MRI however patient with a VP shunt. Unknown if this is MRI compatible.  Will need more information with family.  Dr. Jerrell Belfast has signed off to Dr. Otelia Limes with neurology.  Patient likely admit to hospitalist service however disposition pending Neurology recommendation.  2100: Records reviewed from [facility] health.  Show 2 ED visits however do not show surgical procedure.  I have personally called [facility] and they do not have records of any craniotomy or surgical intervention per telephone conversation.  Of note he has not had any MRIs throughout his ED stays throughout [facility] health.  Labs and imaging personally reviewed  Prior records reviewed.  Neurology to follow.  Recommend stat EEG, possible MRI however unsure if shunt is compatible.  2120: Patient now more alert and responsive.  More movement to right upper and lower extremity.  Still has mild garbled speech. Urinalysis obtained to assess  for infectious process for possible seizure.  [facility].  Patient been discussed with attending physician, Dr. Judd Lienelo who agrees with above treatment, plan disposition.      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

## 2018-10-01 NOTE — ED Notes (Signed)
Patient undressed and Depends checked - no urine output. Per pt's sister, pt has not voided since 13:00 today. MD notified.

## 2018-10-01 NOTE — Progress Notes (Signed)
Patient movement during CT perfusion study resulting in inability to obtain adequate images. Has already received additional 40 cc of contrast for the aborted CTP study. Radiologist on call is being contacted to clear for second attempt at [place]. If cleared, will administer 2 mg IV Ativan prior to study.   Electronically signed: Dr. Kerney Elbe

## 2018-10-01 NOTE — Procedures (Signed)
Patient Name: Patrick Franklin  MRN: [id number]  Epilepsy Attending: Lora Havens  Referring Physician/Provider: Dr Amie [place] Date: 10/01/2018 Duration: 22.21 mins  Patient history: 48yo M with chronic right MCA stroke presented with left gaze preference and garbled speech. EEG to evaluate for seizure  Level of alertness: lethargic  AEDs during EEG study: Keppra, Vimpar, VPA, ativan  Technical aspects: This EEG study was done with scalp electrodes positioned according to the 10-20 International system of electrode placement. Electrical activity was acquired at a sampling rate of 500Hz  and reviewed with a high frequency filter of 70Hz  and a low frequency filter of 1Hz . EEG data were recorded continuously and digitally stored.   DESCRIPTION: EEG showed continuous generalized 2-3hz  delta slowing. EEG was reactive to vocal stimulation. No clear posterior dominant rhythm was seen. Hyperventilation and photic stimulation were not performed.  IMPRESSION: This study is suggestive of severe diffuse encephalopathy, likely secondary to medication effect and/or post ictal state. No seizures or epileptiform discharges were seen throughout the recording.  Patrick Franklin Patrick Franklin

## 2018-10-01 NOTE — ED Triage Notes (Signed)
Pt arrives via EMS from home with reports of left sided gaze. Hx of CVA with left sided deficits. Pt unable to follow commands but moving right side.

## 2018-10-01 NOTE — Progress Notes (Signed)
EEG shows diffuse slowing without electrographic seizure.   Still with dense expressive and receptive aphasia.   Obtaining STAT CT perfusion study.   Electronically signed: Dr. Kerney Elbe

## 2018-10-02 ENCOUNTER — Inpatient Hospital Stay (HOSPITAL_COMMUNITY): Payer: Medicare Other

## 2018-10-02 DIAGNOSIS — I6389 Other cerebral infarction: Secondary | ICD-10-CM

## 2018-10-02 DIAGNOSIS — I82409 Acute embolism and thrombosis of unspecified deep veins of unspecified lower extremity: Secondary | ICD-10-CM

## 2018-10-02 DIAGNOSIS — R4182 Altered mental status, unspecified: Secondary | ICD-10-CM

## 2018-10-02 LAB — LIPID PANEL
Cholesterol: 117 mg/dL (ref 0–200)
HDL: 32 mg/dL — ABNORMAL LOW (ref >40)
LDL Cholesterol: 59 mg/dL (ref 0–99)
Total CHOL/HDL Ratio: 3.7 RATIO
Triglycerides: 129 mg/dL (ref <150)
VLDL: 26 mg/dL (ref 0–40)

## 2018-10-02 LAB — CBC
HCT: 45 % (ref 39.0–52.0)
Hemoglobin: 14.5 g/dL (ref 13.0–17.0)
MCH: 29.4 pg (ref 26.0–34.0)
MCHC: 32.2 g/dL (ref 30.0–36.0)
MCV: 91.3 fL (ref 80.0–100.0)
Platelets: 70 10*3/uL — ABNORMAL LOW (ref 150–400)
RBC: 4.93 MIL/uL (ref 4.22–5.81)
RDW: 15.1 % (ref 11.5–15.5)
WBC: 4.3 10*3/uL (ref 4.0–10.5)
nRBC: 0 % (ref 0.0–0.2)

## 2018-10-02 LAB — BASIC METABOLIC PANEL
Anion gap: 9 (ref 5–15)
BUN: 11 mg/dL (ref 6–20)
CO2: 27 mmol/L (ref 22–32)
Calcium: 8.7 mg/dL — ABNORMAL LOW (ref 8.9–10.3)
Chloride: 111 mmol/L (ref 98–111)
Creatinine, Ser: 0.77 mg/dL (ref 0.61–1.24)
GFR calc Af Amer: >60 mL/min (ref >60)
GFR calc non Af Amer: >60 mL/min (ref >60)
Glucose, Bld: 91 mg/dL (ref 70–99)
Potassium: 4.2 mmol/L (ref 3.5–5.1)
Sodium: 147 mmol/L — ABNORMAL HIGH (ref 135–145)

## 2018-10-02 LAB — HIV ANTIBODY (ROUTINE TESTING W REFLEX): HIV Screen 4th Generation wRfx: NONREACTIVE

## 2018-10-02 LAB — ANTITHROMBIN III: AntiThromb III Func: 106 % (ref 75–120)

## 2018-10-02 LAB — AMMONIA: Ammonia: 24 umol/L (ref 9–35)

## 2018-10-02 LAB — ECHOCARDIOGRAM COMPLETE: Weight: 3114.66 oz

## 2018-10-02 LAB — VALPROIC ACID LEVEL: Valproic Acid Lvl: 83 ug/mL (ref 50.0–100.0)

## 2018-10-02 MED ORDER — LORAZEPAM 2 MG/ML IJ SOLN
2.0000 mg | Freq: Once | INTRAMUSCULAR | Status: AC
  Administered 2018-10-02: 2 mg via INTRAVENOUS

## 2018-10-02 MED ORDER — PERFLUTREN LIPID MICROSPHERE
1.0000 mL | INTRAVENOUS | Status: AC | PRN
  Filled 2018-10-02: qty 10

## 2018-10-02 MED ORDER — STROKE: EARLY STAGES OF RECOVERY BOOK
Freq: Once | Status: AC
  Administered 2018-10-03: 15:00:00

## 2018-10-02 MED ORDER — IOHEXOL 350 MG/ML SOLN
40.0000 mL | Freq: Once | INTRAVENOUS | Status: AC | PRN
  Administered 2018-10-02: 40 mL via INTRAVENOUS

## 2018-10-02 MED ORDER — PERFLUTREN LIPID MICROSPHERE
INTRAVENOUS | Status: AC
  Administered 2018-10-02: 3 mL
  Filled 2018-10-02: qty 10

## 2018-10-02 MED ORDER — LORAZEPAM 2 MG/ML IJ SOLN
INTRAMUSCULAR | Status: AC
  Filled 2018-10-02: qty 1

## 2018-10-02 NOTE — Progress Notes (Addendum)
Neurology Progress Note   S:// No acute events overnight Attempted CT perfusion x2 without meaningful images being obtained due to technical reasons/patient movement. Still waiting on medical records from [place] regarding the safety of VP shunt/skull plate for MRI.  O:// Current vital signs: BP 121/90 (BP Location: Left Arm)    Pulse 73    Temp 97.8 F (36.6 C) (Oral)    Resp 14    Wt 88.3 kg    SpO2 100%    BMI 27.93 kg/m  Vital signs in last 24 hours: Temp:  [97.5 F (36.4 C)-98.1 F (36.7 C)] 97.8 F (36.6 C) (09/08 0700) Pulse Rate:  [66-82] 73 (09/08 0700) Resp:  [11-20] 14 (09/08 0700) BP: (119-144)/(78-105) 121/90 (09/08 0700) SpO2:  [95 %-100 %] 100 % (09/08 0700) Weight:  [88.3 kg] 88.3 kg (09/07 1913) General: Sleeping, difficult to arouse, opens eyes to noxious stimulation. HEENT: Depressed right frontotemporal craniectomy scar on the right CVS: Regular rate rhythm Respiratory breathing normally saturating well on room air Neurological exam Sleepy, difficult to arouse, opens eyes to noxious immolation. Does not follow commands Was not mumbling any words today. Cranial: Pupils equal round react light extraocular movements appear to be full but he does not follow commands for a good examination, chronic left facial weakness on the lower face.  Did not blink to threat from the left.  Blinks to threat from the right. Motor exam: Right upper and lower extremity strong withdrawal to noxious stimulation and strong localization.  Left upper extremity is plegic with increased tone and mild contracture.  Left lower extremity paretic with some mild withdrawal to noxious immolation. Sensory exam: As above Coordination difficult to assess given mentation Medications  Current Facility-Administered Medications:    0.9 %  sodium chloride infusion, , Intravenous, Continuous, Shela Leff, MD, Last Rate: 100 mL/hr at 10/02/18 0054   acetaminophen (TYLENOL) tablet 650  mg, 650 mg, Oral, Q4H PRN **OR** acetaminophen (TYLENOL) solution 650 mg, 650 mg, Per Tube, Q4H PRN **OR** acetaminophen (TYLENOL) suppository 650 mg, 650 mg, Rectal, Q4H PRN, Shela Leff, MD   aspirin suppository 300 mg, 300 mg, Rectal, Daily **OR** aspirin tablet 325 mg, 325 mg, Oral, Daily, Shela Leff, MD   lacosamide (VIMPAT) 100 mg in sodium chloride 0.9 % 25 mL IVPB, 100 mg, Intravenous, Q12H, Shela Leff, MD, Stopped at 10/01/18 2200   levETIRAcetam (KEPPRA) IVPB 500 mg/100 mL premix, 500 mg, Intravenous, Q12H, Shela Leff, MD, Last Rate: 400 mL/hr at 10/02/18 0834, 500 mg at 10/02/18 0834   valproate (DEPACON) 1,000 mg in dextrose 5 % 50 mL IVPB, 1,000 mg, Intravenous, Q12H, Shela Leff, MD, Stopped at 10/01/18 2154 Labs CBC    Component Value Date/Time   WBC 4.3 10/02/2018 0333   RBC 4.93 10/02/2018 0333   HGB 14.5 10/02/2018 0333   HCT 45.0 10/02/2018 0333   PLT 70 (L) 10/02/2018 0333   MCV 91.3 10/02/2018 0333   MCH 29.4 10/02/2018 0333   MCHC 32.2 10/02/2018 0333   RDW 15.1 10/02/2018 0333   LYMPHSABS 1.2 10/01/2018 1850   MONOABS 0.5 10/01/2018 1850   EOSABS 0.1 10/01/2018 1850   BASOSABS 0.0 10/01/2018 1850    CMP     Component Value Date/Time   NA 147 (H) 10/02/2018 0333   K 4.2 10/02/2018 0333   CL 111 10/02/2018 0333   CO2 27 10/02/2018 0333   GLUCOSE 91 10/02/2018 0333   BUN 11 10/02/2018 0333   CREATININE 0.77 10/02/2018 0333  CALCIUM 8.7 (L) 10/02/2018 0333   PROT 5.6 (L) 10/01/2018 1850   ALBUMIN 3.3 (L) 10/01/2018 1850   AST 47 (H) 10/01/2018 1850   ALT 48 (H) 10/01/2018 1850   ALKPHOS 77 10/01/2018 1850   BILITOT 0.8 10/01/2018 1850   GFRNONAA >60 10/02/2018 0333   GFRAA >60 10/02/2018 0333    Lipid Panel     Component Value Date/Time   CHOL 117 10/02/2018 0333   TRIG 129 10/02/2018 0333   HDL 32 (L) 10/02/2018 0333   CHOLHDL 3.7 10/02/2018 0333   VLDL 26 10/02/2018 0333   LDLCALC 59 10/02/2018 0333    Imaging I have reviewed images in epic and the results pertinent to this consultation are: CT of the head with no acute changes CTA head and neck with chronic right MCA occlusion/stenoses and evidence of prior stroke.  Question of left cervical carotid dissection-age-indeterminate. 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  Also had gaze deviation which started to improve.  Speech remains garbled. Stat CT head unremarkable for acute process.  Revealed chronic encephalomalacia. CTA concerning for a possible left cervical carotid age-indeterminate dissection.  CT perfusion study failed x2 Repeat CTH conncerning for evolving left hemispheric hypodensity compared to prior scan. Likely new left hemispheric stroke.  Impression: Evaluate for new left sided stroke. Chronic RMCA stroke H/O Seizure  Recommendations: Stroke w/u - will order. No need for LTM EEG We will follow with you -- Milon DikesAshish Catia Todorov, MD [facility] Pager: [phone] If 7pm to 7am, please call on call as listed on AMION.

## 2018-10-02 NOTE — Evaluation (Signed)
Occupational Therapy Evaluation Patient Details Name: Patrick Franklin MRN: [id number] DOB: 11-21-1970 Today's Date: 10/02/2018    History of Present Illness Patient is a 48 y/o male who presents with episode of left gaze preference and garbled speech. 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 Head CT- evolving hypodensity in posterior left temporal occipital region and consistent with evolving left posterior MCA infarct. More likely concern for seizures. 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   Clinical Impression   PT admitted with L gaze preference and garbled speech. Pt currently with functional limitiations due to the deficits listed below (see OT problem list). Pt currently total +2 total (A) due to lethargic with pending further workup. Pt was 1 person (A) to stand pivot and was using RW for 25-30 feet with family. Sister reports needs to progress to stand pivot transfer to d/c home.  Pt will benefit from skilled OT to increase their independence and safety with adls and balance to allow discharge CIR.     Follow Up Recommendations  CIR    Equipment Recommendations  None recommended by OT    Recommendations for Other Services Rehab consult     Precautions / Restrictions Precautions Precautions: Fall Precaution Comments: seizures Restrictions Weight Bearing Restrictions: No      Mobility Bed Mobility Overal bed mobility: Needs Assistance Bed Mobility: Supine to Sit;Sit to Supine;Rolling Rolling: Total assist;+2 for physical assistance   Supine to sit: Total assist;+2 for physical assistance;+2 for safety/equipment;HOB elevated Sit to supine: Total assist;+2 for physical assistance;+2 for safety/equipment   General bed mobility comments: pt does not initiate any of the  transfer at this time. pt total dependence on staff  Transfers                 General transfer comment: not assessed    Balance Overall balance assessment: Needs assistance Sitting-balance support: Single extremity supported;Feet supported Sitting balance-Leahy Scale: Poor                                     ADL either performed or assessed with clinical judgement   ADL Overall ADL's : Needs assistance/impaired                                       General ADL Comments: total (A) for all adls.      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 pt has specialized glasses for deficit. pt currently unable to sustain eyes open without change of position for arousal     Perception     Praxis      Pertinent Vitals/Pain Pain Assessment: No/denies pain     Hand Dominance Right   Extremity/Trunk Assessment Upper Extremity Assessment Upper Extremity Assessment: LUE deficits/detail;RUE deficits/detail LUE Coordination: decreased fine motor;decreased gross motor   Lower Extremity Assessment Lower Extremity Assessment: Defer to PT evaluation   Cervical / Trunk Assessment Cervical / Trunk Assessment: Other exceptions Cervical / Trunk Exceptions: R rib cage closed and L rib cage flaired with a R lateral flexion of posture but chin rotated L with upward tilt to head   Communication  Cognition Arousal/Alertness: Lethargic Behavior During Therapy: Flat affect Overall Cognitive Status: Difficult to assess                                     General Comments       Exercises     Shoulder Instructions      Home Living Family/patient expects to be discharged to:: Private residence   Available Help at Discharge: Family;Available 24 hours/day;Personal care attendant Type of Home: House                            Additional Comments: family takes him on adventures to parks, out on a boat, to the mountains,. pt has a Environmental health practitioner. normally pre covid was going to adu,lt day program in [place] x2 per week      Prior Functioning/Environment Level of Independence: Needs assistance  Gait / Transfers Assistance Needed: 1 person (A) to stand pivot to chair. sister reports x2 (A) for ambulation more recently ADL's / Homemaking Assistance Needed: max (A) for all            OT Problem List: Decreased strength;Decreased activity tolerance;Impaired balance (sitting and/or standing);Decreased cognition;Decreased safety awareness;Decreased knowledge of use of DME or AE;Decreased knowledge of precautions;Impaired UE functional use;Increased edema;Impaired vision/perception      OT Treatment/Interventions: Self-care/ADL training;Therapeutic exercise;Neuromuscular education;DME and/or AE instruction;Energy conservation;Manual therapy;Modalities;Splinting;Therapeutic activities;Cognitive remediation/compensation;Visual/perceptual remediation/compensation;Patient/family education;Balance training    OT Goals(Current goals can be found in the care plan section) Acute Rehab OT Goals Patient Stated Goal: none stated OT Goal Formulation: With family Time For Goal Achievement: 10/16/18 Potential to Achieve Goals: Good  OT Frequency: Min 2X/week   Barriers to D/C: Decreased caregiver support  sister reports patient must be able to stand pivot with family to d/c home       Co-evaluation PT/OT/SLP Co-Evaluation/Treatment: Yes Reason for Co-Treatment: Complexity of the patient's impairments (multi-system involvement);Necessary to address cognition/behavior during functional activity;For patient/therapist safety;To address functional/ADL transfers   OT goals addressed during session: ADL's and self-care;Proper use of Adaptive equipment and DME;Strengthening/ROM      AM-PAC OT "6 Clicks" Daily Activity      Outcome Measure Help from another person eating meals?: Total Help from another person taking care of personal grooming?: Total Help from another person toileting, which includes using toliet, bedpan, or urinal?: Total Help from another person bathing (including washing, rinsing, drying)?: Total Help from another person to put on and taking off regular upper body clothing?: Total Help from another person to put on and taking off regular lower body clothing?: Total 6 Click Score: 6   End of Session Nurse Communication: Mobility status;Precautions  Activity Tolerance: Patient limited by lethargy Patient left: in bed;with call bell/phone within reach;with bed alarm set;with family/visitor present  OT Visit Diagnosis: Unsteadiness on feet (R26.81);Muscle weakness (generalized) (M62.81);Hemiplegia and hemiparesis Hemiplegia - Right/Left: Left Hemiplegia - dominant/non-dominant: Non-Dominant Hemiplegia - caused by: Other cerebrovascular disease                Time: 4098-1191 OT Time Calculation (min): 19 min Charges:  OT General Charges $OT Visit: 1 Visit OT Evaluation $OT Eval Moderate Complexity: 1 Mod   Jeri Modena, OTR/L  Acute Rehabilitation Services Pager: [phone] Office: [phone] .   Jeri Modena 10/02/2018, 1:54 PM

## 2018-10-02 NOTE — Progress Notes (Signed)
Rehab Admissions Coordinator Note:  Patient was screened by Cleatrice Burke for appropriateness for an Inpatient Acute Rehab Consult per OT recs.  At this time, we are recommending await further porgress with therapy before pursuing dispo. I will follow.Cleatrice Burke RN MSN 10/02/2018, 2:17 PM  I can be reached at [phone].

## 2018-10-02 NOTE — Progress Notes (Signed)
PROGRESS NOTE    Patrick Franklin  ZOX:[id number] DOB: 1971-01-07 DOA: 10/01/2018 PCP: Doreene Nest, NP    Brief Narrative:  Unfortunate 48 year old gentleman with history of large right MCA stroke in 2017 with large right cerebral encephalomalacia, hydrocephalus status post VP shunt, residual left-sided hemiplegia and left-sided hemianopsia, seizure disorder currently on Depakote and Vimpat, neurogenic bladder, asthma, DVT, GERD, hypertension and depression, poor mobility at baseline brought to the hospital with garbled speech and left-sided gauge.  Patient lives with his sister and brother-in-law, they were taking him for outing on the cart, he started looking on the left side as well as was talking something that did not make sense so they brought him to the ER. In the emergency room, patient was aphasic and encephalopathic.  Does have new left MCA stroke.   Assessment & Plan:   Principal Problem:   Neurological deficit present Active Problems:   Essential hypertension   Seizure disorder (HCC)   Thrombocytopenia (HCC)   Neurogenic bladder  Acute left MCA stroke with aphasia, left gaze preference and encephalopathy in a patient with history of devastating right MCA stroke: Clinical findings, aphasia, dysarthria and left gaze preference.  Chronic left lower facial weakness.  Left arm weakness.  Left leg weakness. CT head findings, initial negative.  Subsequent CTA showed left MCA stroke.  Old right MCA stroke. MRI of the brain, unable to do because of VP shunt. CTA of the head and neck, no significant LVO. 2D echocardiogram, pending Antiplatelet therapy, on aspirin.  Continue. LDL 59, on Lipitor 80 at home.  Will resume when able to take by mouth. Hemoglobin A1c, pending DVT prophylaxis, Lovenox  Acute metabolic encephalopathy, suspect seizure versus bilateral stroke: Currently on increased dose of Depakote through IV.  Continued Keppra. Spot EEG without any evidence of  epileptiform focus. Followed by neurology.  Planning for continuous EEG monitoring.  Hypertension: Blood pressures stable.  Permissive hypertension.  Thrombocytopenia: Chronic thrombocytopenia and stable.  Advance care planning: Patient Sister [name] at bedside.  HC POA. Patient has DNR form updated. Patient also has [place] medically scope of treatment form that is attached in the chart. He has wishes for DNR, limited intervention, no PEG tube, no mechanical ventilation.   DVT prophylaxis: Lovenox subcu Code Status: DNR Family Communication: Patient's sister Ms. [name] at bedside Disposition Plan: Unknown at this time.   Consultants:   Neurology  Procedures:   EEG  Antimicrobials:   None   Subjective: Patient seen and examined.  Remains persistently encephalopathy.  He is not interacting.  At times, he speaks something that is garbled.  Objective: Vitals:   10/02/18 0319 10/02/18 0328 10/02/18 0530 10/02/18 0700  BP: (!) 142/78 (!) 127/91 (!) 139/92 121/90  Pulse: 66 68 70 73  Resp: 14 19 20 14   Temp: (!) 97.5 F (36.4 C) (!) 97.5 F (36.4 C) 97.9 F (36.6 C) 97.8 F (36.6 C)  TempSrc: Axillary Axillary Oral Oral  SpO2: 98% 96% 99% 100%  Weight:        Intake/Output Summary (Last 24 hours) at 10/02/2018 1102 Last data filed at 10/02/2018 0616 Gross per 24 hour  Intake 478.98 ml  Output 0 ml  Net 478.98 ml   Filed Weights   10/01/18 1913  Weight: 88.3 kg    Examination:  General exam: Appears calm and comfortable, periods of snoring. Respiratory system: Clear to auscultation. Respiratory effort normal.  No added sounds. Cardiovascular system: S1 & S2 heard, RRR. No JVD, murmurs,  rubs, gallops or clicks. No pedal edema. Gastrointestinal system: Abdomen is nondistended, soft and nontender. No organomegaly or masses felt. Normal bowel sounds heard. Central nervous system: Sleepy, lethargic, unable to interact and follow commands.   Right facial  droop. Is able to withdraw the right side of the body, unable to follow commands or withdraw on the left side. Skin: No rashes, lesions or ulcers Psychiatry: Judgement and insight appear compromised.      Data Reviewed: I have personally reviewed following labs and imaging studies  CBC: Recent Labs  Lab 10/01/18 1850 10/02/18 0333  WBC 4.3 4.3  NEUTROABS 2.6  --   HGB 15.8   15.3 14.5  HCT 47.9   45.0 45.0  MCV 90.2 91.3  PLT 82* 70*   Basic Metabolic Panel: Recent Labs  Lab 10/01/18 1850 10/02/18 0333  NA 143   143 147*  K 4.3   4.2 4.2  CL 109   107 111  CO2 28 27  GLUCOSE 112*   101* 91  BUN 15   17 11   CREATININE 1.00   0.90 0.77  CALCIUM 8.7* 8.7*   GFR: Estimated Creatinine Clearance: 129.1 mL/min (by C-G formula based on SCr of 0.77 mg/dL). Liver Function Tests: Recent Labs  Lab 10/01/18 1850  AST 47*  ALT 48*  ALKPHOS 77  BILITOT 0.8  PROT 5.6*  ALBUMIN 3.3*   No results for input(s): LIPASE, AMYLASE in the last 168 hours. Recent Labs  Lab 10/02/18 0952  AMMONIA 24   Coagulation Profile: Recent Labs  Lab 10/01/18 1850  INR 1.0   Cardiac Enzymes: No results for input(s): CKTOTAL, CKMB, CKMBINDEX, TROPONINI in the last 168 hours. BNP (last 3 results) No results for input(s): PROBNP in the last 8760 hours. HbA1C: No results for input(s): HGBA1C in the last 72 hours. CBG: Recent Labs  Lab 10/01/18 1846  GLUCAP 91   Lipid Profile: Recent Labs    10/02/18 0333  CHOL 117  HDL 32*  LDLCALC 59  TRIG 161  CHOLHDL 3.7   Thyroid Function Tests: No results for input(s): TSH, T4TOTAL, FREET4, T3FREE, THYROIDAB in the last 72 hours. Anemia Panel: No results for input(s): VITAMINB12, FOLATE, FERRITIN, TIBC, IRON, RETICCTPCT in the last 72 hours. Sepsis Labs: No results for input(s): PROCALCITON, LATICACIDVEN in the last 168 hours.  Recent Results (from the past 240 hour(s))  SARS Coronavirus 2 [facility] order, Performed in [facility]  hospital lab) Nasopharyngeal Nasopharyngeal Swab     Status: None   Collection Time: 10/01/18  7:34 PM   Specimen: Nasopharyngeal Swab  Result Value Ref Range Status   SARS Coronavirus 2 NEGATIVE NEGATIVE Final    Comment: (NOTE) If result is NEGATIVE SARS-CoV-2 target nucleic acids are NOT DETECTED. The SARS-CoV-2 RNA is generally detectable in upper and lower  respiratory specimens during the acute phase of infection. The lowest  concentration of SARS-CoV-2 viral copies this assay can detect is 250  copies / mL. A negative result does not preclude SARS-CoV-2 infection  and should not be used as the sole basis for treatment or other  patient management decisions.  A negative result may occur with  improper specimen collection / handling, submission of specimen other  than nasopharyngeal swab, presence of viral mutation(s) within the  areas targeted by this assay, and inadequate number of viral copies  (<250 copies / mL). A negative result must be combined with clinical  observations, patient history, and epidemiological information. If result is POSITIVE SARS-CoV-2  target nucleic acids are DETECTED. The SARS-CoV-2 RNA is generally detectable in upper and lower  respiratory specimens dur ing the acute phase of infection.  Positive  results are indicative of active infection with SARS-CoV-2.  Clinical  correlation with patient history and other diagnostic information is  necessary to determine patient infection status.  Positive results do  not rule out bacterial infection or co-infection with other viruses. If result is PRESUMPTIVE POSTIVE SARS-CoV-2 nucleic acids MAY BE PRESENT.   A presumptive positive result was obtained on the submitted specimen  and confirmed on repeat testing.  While 2019 novel coronavirus  (SARS-CoV-2) nucleic acids may be present in the submitted sample  additional confirmatory testing may be necessary for epidemiological  and / or clinical management  purposes  to differentiate between  SARS-CoV-2 and other Sarbecovirus currently known to infect humans.  If clinically indicated additional testing with an alternate test  methodology [phone]) is advised. The SARS-CoV-2 RNA is generally  detectable in upper and lower respiratory sp ecimens during the acute  phase of infection. The expected result is Negative. Fact Sheet for Patients:  BoilerBrush.com.cy Fact Sheet for Healthcare Providers: https://pope.com/ This test is not yet approved or cleared by the [place] FDA and has been authorized for detection and/or diagnosis of SARS-CoV-2 by FDA under an Emergency Use Authorization (EUA).  This EUA will remain in effect (meaning this test can be used) for the duration of the COVID-19 declaration under Section 564(b)(1) of the Act, 21 U.S.C. section 360bbb-3(b)(1), unless the authorization is terminated or revoked sooner. Performed at [facility], [address], [place], [place] [address]          Radiology Studies: Ct Angio Head W Or Wo Contrast  Result Date: 10/01/2018 CLINICAL DATA:  Code stroke.  Old. EXAM: CT ANGIOGRAPHY HEAD AND NECK TECHNIQUE: Multidetector CT imaging of the head and neck was performed using the standard protocol during bolus administration of intravenous contrast. Multiplanar CT image reconstructions and MIPs were obtained to evaluate the vascular anatomy. Carotid stenosis measurements (when applicable) are obtained utilizing NASCET criteria, using the distal internal carotid diameter as the denominator. CONTRAST:  63mL OMNIPAQUE IOHEXOL 350 MG/ML SOLN COMPARISON:  Prior CT from 06/23/2017. FINDINGS: CT HEAD FINDINGS Brain: Extensive encephalomalacia related to chronic right MCA territory infarcts seen throughout the right cerebral hemisphere, stable from previous. Postoperative changes from overlying right craniotomy. A left frontal approach VP shunt  catheter in place with tip terminating near the septum pellucidum. Overall, ventricular size and morphology is relatively similar to previous without hydrocephalus. Ex vacuo dilatation of the right lateral ventricle related to the chronic right frontal encephalomalacia. Dystrophic calcification at the frontal horn of the right lateral ventricle again noted. No acute intracranial hemorrhage. No definite or convincing acute large vessel territory infarct. No mass lesion or extra-axial fluid collection. Chronic 12 mm left-to-right shift of the septum pellucidum related to the right cerebral encephalomalacia noted, stable. Vascular: No hyperdense vessel. Mild calcified atherosclerosis at the skull base. Skull: Prior right hemi craniotomy. Left frontal burr hole with shunt catheter in place. Remote burr holes seen at the bilateral parietal calvarium related to previous shunt catheters. Scalp soft tissues demonstrate no acute finding. Sinuses: Paranasal sinuses are clear.  No mastoid effusion. Orbits: Left gaze noted. Aspects equals 10. Review of the MIP images confirms the above findings CTA NECK FINDINGS Aortic arch: Visualized aortic arch of normal caliber with normal branch pattern. Minimal plaque about the origin of the great  vessels without hemodynamically significant stenosis. Visualized subclavian arteries widely patent. Right carotid system: Right common and internal carotid arteries widely patent without stenosis, dissection, or occlusion. No significant atheromatous narrowing about the right carotid bifurcation. Right ICA tortuous at the level of the skull base. Left carotid system: Left common carotid artery widely patent from its origin to the bifurcation without stenosis. No significant atheromatous narrowing about the left bifurcation. Focal tortuosity of the mid left ICA with associated market vessel irregularity and severe high-grade stenosis is seen, concerning for arterial dissection (series 8, images  128-117). A radiologic string sign is present. No frank intraluminal thrombus or raised dissection flap identified. Finding is age indeterminate. Distally, left ICA otherwise patent to the skull base without additional stenosis or other vascular abnormality. Vertebral arteries: Left vertebral artery dominant and arises from the left subclavian artery. 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 Right vertebral is diffusely hypoplastic, and is patent within the neck, but nearly occludes at the level of the skull base. Dominant left vertebral artery widely patent without stenosis, dissection, or occlusion. Skeleton: No acute osseous finding. No discrete lytic or blastic osseous lesions. Other neck: No other acute soft tissue abnormality within the neck. Upper chest: Visualized upper chest demonstrates no acute finding. Partially visualized lungs are grossly clear. Review of the MIP images confirms the above findings CTA HEAD FINDINGS Anterior circulation: Petrous, cavernous, and supraclinoid segments widely patent without hemodynamically significant stenosis. ICA termini well perfused. A1 segments widely patent. Normal anterior communicating artery complex. Anterior cerebral arteries patent to their distal aspects without stenosis. Left M1 widely patent. Normal left MCA bifurcation. Left MCA branches well perfused to their distal aspects. Right MCA hypoplastic and attenuated, consistent with right MCA territory infarct. Negative right MCA bifurcation. No right-sided large vessel occlusion. Posterior circulation: Focal non stenotic plaque noted within the left V4 segment beyond the takeoff of the left PICA. Left V4 segment otherwise widely patent to the vertebrobasilar junction. Left PICA patent. Right vertebral markedly hypoplastic as it courses into the cranial vault,  contributing little to the posterior circulation. Right PICA patent. Basilar patent to its distal aspect without stenosis. Superior cerebral arteries patent bilaterally. Both of the posterior cerebral arteries primarily supplied via the basilar and are well perfused to their distal aspects. Venous sinuses: Grossly patent allowing for timing the contrast bolus. Anatomic variants: Dominant left vertebral artery with diffusely hypoplastic right vertebral artery. No intracranial aneurysm. Review of the MIP images confirms the above findings IMPRESSION: CT HEAD IMPRESSION: 1. No acute intracranial abnormality. 2. Aspects equals 10. 3. Chronic encephalomalacia throughout the right MCA territory, consistent with chronic right MCA territory infarct. 4. Left frontal approach VP shunt catheter in place with tip terminating in the left lateral ventricle. Stable ventricular size and morphology without hydrocephalus. CTA HEAD AND NECK IMPRESSION: 1. Negative CTA for emergent large vessel occlusion. 2. 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 No frank intraluminal thrombus or raised dissection flap identified. 3. No other hemodynamically significant or correctable stenosis identified about the major arterial vasculature of the head and neck. 4. Diffusely hypoplastic and attenuated right MCA, consistent with chronic right MCA territory infarct. Critical Value/emergent results were called by telephone at the time of interpretation on 10/01/2018 at 7:15 pm to Dr. Milon Dikes , who  verbally acknowledged these results. Electronically Signed   By: Rise MuBenjamin  McClintock M.D.   On: 10/01/2018 19:58   Ct Head Wo Contrast  Result Date: 10/02/2018 CLINICAL DATA:  Follow-up examination for acute stroke. EXAM: CT HEAD WITHOUT CONTRAST TECHNIQUE: Contiguous axial images were obtained from the base of the skull through the vertex  without intravenous contrast. COMPARISON:  Prior CTs from 10/01/2018. FINDINGS: Brain: Now evident is a focal region of all vein cytotoxic edema involving the posterior left temporal occipital region, consistent with evolving posterior left MCA territory infarct. This is not visualized on previous code stroke CT or CT perfusion. No associated hemorrhage or mass effect. Remainder of the brain is stable in appearance with no other new finding. No other acute large vessel territory infarct. Large chronic right MCA territory infarct with associated encephalomalacia. Left frontal approach shunt catheter in stable position. Stable ventricular size and morphology. No extra-axial fluid collection. Vascular: Residual contrast material seen within the intracranial circulation. Calcified atherosclerosis at the skull base. Skull: Prior right craniotomy.  No new finding. Sinuses/Orbits: Globes and orbital soft tissues demonstrate no acute finding. Previously seen left gaze no longer clearly visualized. Paranasal sinuses and mastoid air cells remain clear. Other: None. IMPRESSION: 1. Evolving hypodensity within the posterior left temporal occipital region is now visible, consistent with evolving left posterior MCA territory infarct. No associated hemorrhage or mass effect. 2. Otherwise stable head CT. No other new acute intracranial abnormality. Electronically Signed   By: Rise MuBenjamin  McClintock M.D.   On: 10/02/2018 05:26   Ct Angio Neck W Or Wo Contrast  Result Date: 10/01/2018 CLINICAL DATA:  Code stroke.  Old. EXAM: CT ANGIOGRAPHY HEAD AND NECK TECHNIQUE: Multidetector CT imaging of the head and neck was performed using the standard protocol during bolus administration of intravenous contrast. Multiplanar CT image reconstructions and MIPs were obtained to evaluate the vascular anatomy. Carotid stenosis measurements (when applicable) are obtained utilizing NASCET criteria, using the distal internal carotid diameter as the  denominator. CONTRAST:  75mL OMNIPAQUE IOHEXOL 350 MG/ML SOLN COMPARISON:  Prior CT from 06/23/2017. FINDINGS: CT HEAD FINDINGS Brain: Extensive encephalomalacia related to chronic right MCA territory infarcts seen throughout the right cerebral hemisphere, stable from previous. Postoperative changes from overlying right craniotomy. A left frontal approach VP shunt catheter in place with tip terminating near the septum pellucidum. Overall, ventricular size and morphology is relatively similar to previous without hydrocephalus. Ex vacuo dilatation of the right lateral ventricle related to the chronic right frontal encephalomalacia. Dystrophic calcification at the frontal horn of the right lateral ventricle again noted. No acute intracranial hemorrhage. No definite or convincing acute large vessel territory infarct. No mass lesion or extra-axial fluid collection. Chronic 12 mm left-to-right shift of the septum pellucidum related to the right cerebral encephalomalacia noted, stable. Vascular: No hyperdense vessel. Mild calcified atherosclerosis at the skull base. Skull: Prior right hemi craniotomy. Left frontal burr hole with shunt catheter in place. Remote burr holes seen at the bilateral parietal calvarium related to previous shunt catheters. Scalp soft tissues demonstrate no acute finding. Sinuses: Paranasal sinuses are clear.  No mastoid effusion. Orbits: Left gaze noted. Aspects equals 10. Review of the MIP images confirms the above findings CTA NECK FINDINGS Aortic arch: Visualized aortic arch of normal caliber with normal branch pattern. Minimal plaque about the origin of the great vessels without hemodynamically significant stenosis. Visualized subclavian arteries widely patent. Right carotid system: Right common and internal carotid arteries widely patent without stenosis, dissection, or occlusion. No significant  atheromatous narrowing about the right carotid bifurcation. Right ICA tortuous at the level of the  skull base. Left carotid system: Left common carotid artery widely patent from its origin to the bifurcation without stenosis. No significant atheromatous narrowing about the left bifurcation. 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 A radiologic string sign is present. No frank intraluminal thrombus or raised dissection flap identified. Finding is age indeterminate. Distally, left ICA otherwise patent to the skull base without additional stenosis or other vascular abnormality. Vertebral arteries: Left vertebral artery dominant and arises from the left subclavian artery. 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 Right vertebral is diffusely hypoplastic, and is patent within the neck, but nearly occludes at the level of the skull base. Dominant left vertebral artery widely patent without stenosis, dissection, or occlusion. Skeleton: No acute osseous finding. No discrete lytic or blastic osseous lesions. Other neck: No other acute soft tissue abnormality within the neck. Upper chest: Visualized upper chest demonstrates no acute finding. Partially visualized lungs are grossly clear. Review of the MIP images confirms the above findings CTA HEAD FINDINGS Anterior circulation: Petrous, cavernous, and supraclinoid segments widely patent without hemodynamically significant stenosis. ICA termini well perfused. A1 segments widely patent. Normal anterior communicating artery complex. Anterior cerebral arteries patent to their distal aspects without stenosis. Left M1 widely patent. Normal left MCA bifurcation. Left MCA branches well perfused to their distal aspects. Right MCA hypoplastic and attenuated, consistent with right MCA  territory infarct. Negative right MCA bifurcation. No right-sided large vessel occlusion. Posterior circulation: Focal non stenotic plaque noted within the left V4 segment beyond the takeoff of the left PICA. Left V4 segment otherwise widely patent to the vertebrobasilar junction. Left PICA patent. Right vertebral markedly hypoplastic as it courses into the cranial vault, contributing little to the posterior circulation. Right PICA patent. Basilar patent to its distal aspect without stenosis. Superior cerebral arteries patent bilaterally. Both of the posterior cerebral arteries primarily supplied via the basilar and are well perfused to their distal aspects. Venous sinuses: Grossly patent allowing for timing the contrast bolus. Anatomic variants: Dominant left vertebral artery with diffusely hypoplastic right vertebral artery. No intracranial aneurysm. Review of the MIP images confirms the above findings IMPRESSION: CT HEAD IMPRESSION: 1. No acute intracranial abnormality. 2. Aspects equals 10. 3. Chronic encephalomalacia throughout the right MCA territory, consistent with chronic right MCA territory infarct. 4. Left frontal approach VP shunt catheter in place with tip terminating in the left lateral ventricle. Stable ventricular size and morphology without hydrocephalus. CTA HEAD AND NECK IMPRESSION: 1. Negative CTA for emergent large vessel occlusion. 2. 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 No frank intraluminal thrombus or raised dissection flap identified. 3. No other hemodynamically significant or correctable stenosis identified about the major arterial vasculature of the head and neck. 4. Diffusely hypoplastic and attenuated right MCA, consistent with chronic right MCA territory infarct. Critical Value/emergent results were called by telephone at the time of interpretation on 10/01/2018 at 7:15  pm to Dr. Amie Portland , who verbally acknowledged these results. Electronically Signed   By: Jeannine Boga M.D.   On: 10/01/2018 19:58   Ct Cerebral Perfusion W Contrast  Result  Date: 10/02/2018 CLINICAL DATA:  Initial evaluation for acute stroke, left-sided gaze with confusion. EXAM: CT PERFUSION BRAIN TECHNIQUE: Multiphase CT imaging of the brain was performed following IV bolus contrast injection. Subsequent parametric perfusion maps were calculated using RAPID software. CONTRAST:  40mL OMNIPAQUE IOHEXOL 350 MG/ML SOLN COMPARISON:  Prior CTA and CT from 10/01/2018. FINDINGS: CT Brain Perfusion Findings: CBF (<30%) Volume: 55mL Perfusion (Tmax>6.0s) volume: 22mL Mismatch Volume: -33mL ASPECTS on noncontrast CT Head: 10 at 7:58 p.m. on 10/01/2018. Infarct Core: There is apparent core infarct overlying the periphery of the right cerebral convexity, felt to be artifactual given the extensive encephalomalacia throughout the right cerebral hemisphere due to the chronic right MCA territory infarct. No visible core infarct within the left cerebral hemisphere. Infarction Location:Negative CT perfusion for acute core infarct within the left cerebral hemisphere. There is elevated cerebral blood volume and cerebral blood flow throughout the left cerebral hemisphere, likely reflecting asymmetric perfusion as compared to the relative lack of perfusion within the right cerebral hemisphere due to the extensive chronic encephalomalacia and attenuated right middle cerebral artery. Associated decreased T-max seen throughout this region as well. Changes related to underlying seizure could also have this appearance, although the diffuse nature of these findings throughout the left cerebral hemisphere with perhaps not be expected to be seen in the setting of a seizure focus. IMPRESSION: 1. Perfusion mismatch throughout the left cerebral hemisphere with elevated cerebral blood flow, cerebral blood volume, with reduced T-max.  While this finding is almost certainly at least in part reflective of perfusion differences as compared to the contralateral right hemisphere that demonstrates extensive chronic right MCA encephalomalacia, possible superimposed changes related to seizure could also have this appearance. Correlation with EEG may be helpful for further evaluation. 2. No core infarct within the left cerebral hemisphere by CT perfusion. Given the technical limitations of this study, a short interval follow-up non-contrast head CT to evaluate for possible interval change may be helpful, and also provide reassurance that in fact no evolving ischemic changes are present. Results were discussed by telephone at the time of interpretation on 10/02/2018 at approximately 2:04 am with Dr. Caryl Pina. Electronically Signed   By: Rise Mu M.D.   On: 10/02/2018 02:33   Dg Chest Port 1 View  Result Date: 10/01/2018 CLINICAL DATA:  Altered mental status EXAM: PORTABLE CHEST 1 VIEW COMPARISON:  None. FINDINGS: Low lung volumes. Left base scarring or atelectasis. Right lung clear. Heart is normal size. No effusions or acute bony abnormality. IMPRESSION: Left base scarring or atelectasis.  Low lung volumes. Electronically Signed   By: Charlett Nose M.D.   On: 10/01/2018 22:00   Ct Head Code Stroke Wo Contrast  Result Date: 10/01/2018 CLINICAL DATA:  Code stroke.  Old. EXAM: CT ANGIOGRAPHY HEAD AND NECK TECHNIQUE: Multidetector CT imaging of the head and neck was performed using the standard protocol during bolus administration of intravenous contrast. Multiplanar CT image reconstructions and MIPs were obtained to evaluate the vascular anatomy. Carotid stenosis measurements (when applicable) are obtained utilizing NASCET criteria, using the distal internal carotid diameter as the denominator. CONTRAST:  75mL OMNIPAQUE IOHEXOL 350 MG/ML SOLN COMPARISON:  Prior CT from 06/23/2017. FINDINGS: CT HEAD FINDINGS Brain: Extensive  encephalomalacia related to chronic right MCA territory infarcts seen throughout the right cerebral hemisphere, stable from previous. Postoperative changes from overlying right craniotomy. A left frontal approach VP shunt catheter in place with tip terminating near the septum pellucidum. Overall, ventricular size and morphology is relatively similar  to previous without hydrocephalus. Ex vacuo dilatation of the right lateral ventricle related to the chronic right frontal encephalomalacia. Dystrophic calcification at the frontal horn of the right lateral ventricle again noted. No acute intracranial hemorrhage. No definite or convincing acute large vessel territory infarct. No mass lesion or extra-axial fluid collection. Chronic 12 mm left-to-right shift of the septum pellucidum related to the right cerebral encephalomalacia noted, stable. Vascular: No hyperdense vessel. Mild calcified atherosclerosis at the skull base. Skull: Prior right hemi craniotomy. Left frontal burr hole with shunt catheter in place. Remote burr holes seen at the bilateral parietal calvarium related to previous shunt catheters. Scalp soft tissues demonstrate no acute finding. Sinuses: Paranasal sinuses are clear.  No mastoid effusion. Orbits: Left gaze noted. Aspects equals 10. Review of the MIP images confirms the above findings CTA NECK FINDINGS Aortic arch: Visualized aortic arch of normal caliber with normal branch pattern. Minimal plaque about the origin of the great vessels without hemodynamically significant stenosis. Visualized subclavian arteries widely patent. Right carotid system: Right common and internal carotid arteries widely patent without stenosis, dissection, or occlusion. No significant atheromatous narrowing about the right carotid bifurcation. Right ICA tortuous at the level of the skull base. Left carotid system: Left common carotid artery widely patent from its origin to the bifurcation without stenosis. No significant  atheromatous narrowing about the left bifurcation. 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 A radiologic string sign is present. No frank intraluminal thrombus or raised dissection flap identified. Finding is age indeterminate. Distally, left ICA otherwise patent to the skull base without additional stenosis or other vascular abnormality. Vertebral arteries: Left vertebral artery dominant and arises from the left subclavian artery. 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 Right vertebral is diffusely hypoplastic, and is patent within the neck, but nearly occludes at the level of the skull base. Dominant left vertebral artery widely patent without stenosis, dissection, or occlusion. Skeleton: No acute osseous finding. No discrete lytic or blastic osseous lesions. Other neck: No other acute soft tissue abnormality within the neck. Upper chest: Visualized upper chest demonstrates no acute finding. Partially visualized lungs are grossly clear. Review of the MIP images confirms the above findings CTA HEAD FINDINGS Anterior circulation: Petrous, cavernous, and supraclinoid segments widely patent without hemodynamically significant stenosis. ICA termini well perfused. A1 segments widely patent. Normal anterior communicating artery complex. Anterior cerebral arteries patent to their distal aspects without stenosis. Left M1 widely patent. Normal left MCA bifurcation. Left MCA branches well perfused to their distal aspects. Right MCA hypoplastic and attenuated, consistent with right MCA territory infarct. Negative right MCA bifurcation. No right-sided large vessel occlusion. Posterior circulation: Focal non stenotic plaque noted  within the left V4 segment beyond the takeoff of the left PICA. Left V4 segment otherwise widely patent to the vertebrobasilar junction. Left PICA patent. Right vertebral markedly hypoplastic as it courses into the cranial vault, contributing little to the posterior circulation. Right PICA patent. Basilar patent to its distal aspect without stenosis. Superior cerebral arteries patent bilaterally. Both of the posterior cerebral arteries primarily supplied via the basilar and are well perfused to their distal aspects. Venous sinuses: Grossly patent allowing for timing the contrast bolus. Anatomic variants: Dominant left vertebral artery with diffusely hypoplastic right vertebral  artery. No intracranial aneurysm. Review of the MIP images confirms the above findings IMPRESSION: CT HEAD IMPRESSION: 1. No acute intracranial abnormality. 2. Aspects equals 10. 3. Chronic encephalomalacia throughout the right MCA territory, consistent with chronic right MCA territory infarct. 4. Left frontal approach VP shunt catheter in place with tip terminating in the left lateral ventricle. Stable ventricular size and morphology without hydrocephalus. CTA HEAD AND NECK IMPRESSION: 1. Negative CTA for emergent large vessel occlusion. 2. 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 No frank intraluminal thrombus or raised dissection flap identified. 3. No other hemodynamically significant or correctable stenosis identified about the major arterial vasculature of the head and neck. 4. Diffusely hypoplastic and attenuated right MCA, consistent with chronic right MCA territory infarct. Critical Value/emergent results were called by telephone at the time of interpretation on 10/01/2018 at 7:15 pm to Dr. Milon Dikes , who verbally acknowledged these results. Electronically Signed   By: Rise Mu M.D.   On: 10/01/2018 19:58         Scheduled Meds:   stroke: mapping our early stages of recovery book   Does not apply Once   aspirin  300 mg Rectal Daily   Or   aspirin  325 mg Oral Daily   Continuous Infusions:  sodium chloride 100 mL/hr at 10/02/18 0054   lacosamide (VIMPAT) IV Stopped (10/01/18 2200)   levETIRAcetam 500 mg (10/02/18 0834)   valproate sodium Stopped (10/01/18 2154)     LOS: 1 day    Time spent: 40 minutes    Dorcas Carrow, MD [facility] Pager [phone]  If 7PM-7AM, please contact night-coverage [email or web address] Password [email or web address] 10/02/2018, 11:02 AM

## 2018-10-02 NOTE — Progress Notes (Signed)
Patient had period of apnea and desat to 83 x 2. MD notified, called RRT and RT. Put on 2 L . Will continue to monitor.

## 2018-10-02 NOTE — Progress Notes (Signed)
Bilateral lower extremity venous duplex completed. Refer to "CV Proc" under chart review to view preliminary results.  10/02/2018  Maudry Mayhew, MHA, RVT, RDCS, RDMS

## 2018-10-02 NOTE — Progress Notes (Signed)
SLP Cancellation Note  Patient Details Name: Patrick Franklin MRN: [id number] DOB: 07-Jun-1970   Cancelled treatment:       Reason Eval/Treat Not Completed: Fatigue/lethargy limiting ability to participate - pt asleep, not sufficiently arousing this morning per RN. Will hold cognitive-linguistic evaluation for now. Note that  Pt has not been alert enough to attempt the Yale swallow screen - RN aware of this and will complete and order SLP PRN once he becomes more alert.    Venita Sheffield Julieann Drummonds 10/02/2018, 9:30 AM  Pollyann Glen, M.A. [place] Acute Environmental education officer [phone] Office [phone]

## 2018-10-02 NOTE — Procedures (Signed)
Patient Name: Patrick Franklin  MRN: [id number]  Epilepsy Attending: Lora Havens  Referring Physician/Provider: Dr Amie [place] Date: 10/02/2018 Duration: 22.21 mins  Patient history: 48yo M with chronic right MCA stroke presented with left gaze preference and garbled speech. EEG to evaluate for seizure  Level of alertness: lethargic  AEDs during EEG study: Keppra, Vimpar, VPA  Technical aspects: This EEG study was done with scalp electrodes positioned according to the 10-20 International system of electrode placement. Electrical activity was acquired at a sampling rate of 500Hz  and reviewed with a high frequency filter of 70Hz  and a low frequency filter of 1Hz . EEG data were recorded continuously and digitally stored.   DESCRIPTION: EEG showed continuous generalized 2-3hz  delta slowing, maximal right hemisphere. No clear posterior dominant rhythm was seen. Hyperventilation and photic stimulation were not performed.  IMPRESSION: This study is suggestive of severe diffuse encephalopathy as well as cortical dysfunction in right hemisphere, likely secondary to underlying stroke. No seizures or epileptiform discharges were seen throughout the recording.  Patrick Franklin

## 2018-10-02 NOTE — Evaluation (Signed)
Physical Therapy Evaluation Patient Details Name: Patrick RatelChristopher L Franklin MRN: [id number] DOB: Nov 24, 1970 Today's Date: 10/02/2018   History of Present Illness  Patient is a 48 y/o male who presents with episode of left gaze preference and garbled speech. 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 Head CT- evolving hypodensity in posterior left temporal occipital region and consistent with evolving left posterior MCA infarct. More likely concern for seizures. 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  Clinical Impression  Patient presents with lethargy, baseline left sided hemiparesis, baseline left visual field deficits (in periphery), impaired balance, left gaze preference and impaired mobility s/p above. Pt lives with sister and brother in law and uses specialized w/c for mobility, ambulating 25 feet with assist PTA. Also has private caregiver and was going to adult daycare twice/week prior to Covid. Today, session limited due to decreased arousal. Requires total A for all bed mobility and varying assist for sitting balance. Able to open eyes for a few seconds at a time but not sustain. Not following any commands today. Pending improvement in arousal and pt's ability to participate, would recommend CIR to maximize independence and mobility prior to return home. Pt has great support system.     Follow Up Recommendations CIR;Supervision for mobility/OOB    Equipment Recommendations  None recommended by PT    Recommendations for Other Services Rehab consult     Precautions / Restrictions Precautions Precautions: Fall Precaution Comments: seizures Restrictions Weight Bearing Restrictions: No      Mobility  Bed Mobility Overal bed mobility: Needs Assistance Bed Mobility: Supine to Sit;Sit to  Supine;Rolling Rolling: Total assist;+2 for physical assistance   Supine to sit: Total assist;+2 for physical assistance;+2 for safety/equipment;HOB elevated Sit to supine: Total assist;+2 for physical assistance;+2 for safety/equipment   General bed mobility comments: pt does not initiate any of the transfer at this time. pt total dependence on staff  Transfers                 General transfer comment: not assessed  Ambulation/Gait                Stairs            Wheelchair Mobility    Modified Rankin (Stroke Patients Only) Modified Rankin (Stroke Patients Only) Pre-Morbid Rankin Score: Moderately severe disability Modified Rankin: Severe disability     Balance Overall balance assessment: Needs assistance Sitting-balance support: Single extremity supported;Feet supported Sitting balance-Leahy Scale: Poor Sitting balance - Comments: Requires Max A with moments of Min guard with minimal righting reactions with RUE.                                     Pertinent Vitals/Pain Pain Assessment: Faces Faces Pain Scale: No hurt    Home Living Family/patient expects to be discharged to:: Private residence Living Arrangements: Other relatives Available Help at Discharge: Family;Available 24 hours/day;Personal care attendant Type of Home: House           Additional Comments: family takes him on adventures to parks, out on a boat, to the mountains,. pt has a Chemical engineerpersonal aide. normally pre covid was going to adullt day program in [place] x2 per week    Prior Function Level of Independence: Needs assistance   Gait / Transfers Assistance  Needed: 1 person (A) to stand pivot to chair. sister reports x2 (A) for ambulation more recently, no more than 25' to get to bathroom.  ADL's / Homemaking Assistance Needed: max (A) for all        Hand Dominance   Dominant Hand: Right    Extremity/Trunk Assessment   Upper Extremity Assessment Upper  Extremity Assessment: Defer to OT evaluation LUE Coordination: decreased fine motor;decreased gross motor    Lower Extremity Assessment Lower Extremity Assessment: Difficult to assess due to impaired cognition(no voluntary movement of BLEs)    Cervical / Trunk Assessment Cervical / Trunk Assessment: Other exceptions Cervical / Trunk Exceptions: R rib cage closed and L rib cage flaired with a R lateral flexion of posture but chin rotated L with upward tilt to head  Communication      Cognition Arousal/Alertness: Lethargic Behavior During Therapy: Flat affect Overall Cognitive Status: Difficult to assess                                 General Comments: Pt opens eyes for a few seconsd at a time. prefers left gaze preference but able to gaze right and midline especially to familiar voice like his sisters. not following any commands. States "okay" a few times during session and "my peepee hurts." per sister, pt with visual field cut at baseline on left side.      General Comments General comments (skin integrity, edema, etc.): Sister present during session. Sister provided all informationr egarding PLOF/history.    Exercises     Assessment/Plan    PT Assessment Patient needs continued PT services  PT Problem List Decreased balance;Decreased strength;Decreased mobility;Decreased range of motion;Impaired tone;Decreased cognition;Decreased coordination;Decreased activity tolerance       PT Treatment Interventions Therapeutic activities;Gait training;Therapeutic exercise;Patient/family education;Balance training;Wheelchair mobility training;Neuromuscular re-education;Functional mobility training;Cognitive remediation    PT Goals (Current goals can be found in the Care Plan section)  Acute Rehab PT Goals Patient Stated Goal: none stated; sister would like for him to be able to transfer with assist PT Goal Formulation: With family Time For Goal Achievement:  10/16/18 Potential to Achieve Goals: Fair    Frequency Min 3X/week   Barriers to discharge        Co-evaluation   Reason for Co-Treatment: Complexity of the patient's impairments (multi-system involvement);Necessary to address cognition/behavior during functional activity;For patient/therapist safety;To address functional/ADL transfers   OT goals addressed during session: ADL's and self-care;Proper use of Adaptive equipment and DME;Strengthening/ROM       AM-PAC PT "6 Clicks" Mobility  Outcome Measure Help needed turning from your back to your side while in a flat bed without using bedrails?: Total Help needed moving from lying on your back to sitting on the side of a flat bed without using bedrails?: Total Help needed moving to and from a bed to a chair (including a wheelchair)?: Total Help needed standing up from a chair using your arms (e.g., wheelchair or bedside chair)?: Total Help needed to walk in hospital room?: Total Help needed climbing 3-5 steps with a railing? : Total 6 Click Score: 6    End of Session   Activity Tolerance: Patient limited by lethargy Patient left: in bed;with call bell/phone within reach;with bed alarm set;with family/visitor present Nurse Communication: Mobility status PT Visit Diagnosis: Muscle weakness (generalized) (M62.81);Hemiplegia and hemiparesis Hemiplegia - Right/Left: Left Hemiplegia - dominant/non-dominant: Non-dominant Hemiplegia - caused by: Cerebral infarction(chronic)    Time:  2119-4174 PT Time Calculation (min) (ACUTE ONLY): 22 min   Charges:   PT Evaluation $PT Eval Moderate Complexity: 1 Mod          Mylo Red, PT, DPT Acute Rehabilitation Services Pager [phone] Office [phone]      Blake Divine A Lanier Ensign 10/02/2018, 3:18 PM

## 2018-10-02 NOTE — Progress Notes (Signed)
Echocardiogram 2D Echocardiogram has been performed.  Patrick Franklin 10/02/2018, 12:07 PM

## 2018-10-02 NOTE — Progress Notes (Signed)
EEG Completed; Results Pending; will be LTM later today after head CT per Dr Rory Percy.

## 2018-10-03 DIAGNOSIS — Z8673 Personal history of transient ischemic attack (TIA), and cerebral infarction without residual deficits: Secondary | ICD-10-CM

## 2018-10-03 DIAGNOSIS — G40909 Epilepsy, unspecified, not intractable, without status epilepticus: Secondary | ICD-10-CM

## 2018-10-03 DIAGNOSIS — I6340 Cerebral infarction due to embolism of unspecified cerebral artery: Secondary | ICD-10-CM

## 2018-10-03 DIAGNOSIS — I63412 Cerebral infarction due to embolism of left middle cerebral artery: Secondary | ICD-10-CM

## 2018-10-03 DIAGNOSIS — Z982 Presence of cerebrospinal fluid drainage device: Secondary | ICD-10-CM

## 2018-10-03 HISTORY — DX: Cerebral infarction due to embolism of unspecified cerebral artery: I63.40

## 2018-10-03 LAB — BETA-2-GLYCOPROTEIN I ABS, IGG/M/A
Beta-2 Glyco I IgG: <9 GPI IgG units (ref 0–20)
Beta-2-Glycoprotein I IgA: <9 GPI IgA units (ref 0–25)
Beta-2-Glycoprotein I IgM: <9 GPI IgM units (ref 0–32)

## 2018-10-03 LAB — CARDIOLIPIN ANTIBODIES, IGG, IGM, IGA
Anticardiolipin IgA: <9 APL U/mL (ref 0–11)
Anticardiolipin IgG: <9 GPL U/mL (ref 0–14)
Anticardiolipin IgM: <9 MPL U/mL (ref 0–12)

## 2018-10-03 LAB — HEMOGLOBIN A1C
Hgb A1c MFr Bld: 5.2 % (ref 4.8–5.6)
Mean Plasma Glucose: 103 mg/dL

## 2018-10-03 LAB — HOMOCYSTEINE

## 2018-10-03 MED ORDER — ASPIRIN EC 81 MG PO TBEC
81.0000 mg | DELAYED_RELEASE_TABLET | Freq: Every day | ORAL | Status: DC
  Administered 2018-10-05: 81 mg via ORAL
  Filled 2018-10-03: qty 1

## 2018-10-03 MED ORDER — ASPIRIN 300 MG RE SUPP
300.0000 mg | Freq: Every day | RECTAL | Status: DC
  Administered 2018-10-04: 300 mg via RECTAL
  Filled 2018-10-03: qty 1

## 2018-10-03 NOTE — Progress Notes (Signed)
SLP Cancellation Note  Patient Details Name: Patrick Franklin MRN: [id number] DOB: [birth year]   Cancelled treatment:       Reason Eval/Treat Not Completed: Patient declined, no reason specified - Despite multimodal attempts by SLP and pt's sister, pt refused to participate, closing eyes and turning head away from po presentations. Pt would not follow commands for me or his sister. Nonvocal. SLP will continue efforts.  Shloime Keilman B. Quentin Ore [facility], [place] Speech Language Pathologist [phone]  Shonna Chock 10/03/2018, 10:52 AM

## 2018-10-03 NOTE — Progress Notes (Signed)
STROKE TEAM PROGRESS NOTE   INTERVAL HISTORY Patient sitting in chair, receptive aphasia, not following commands.  However, he is able to have spontaneous speech, no word salad.  Right arm and leg spontaneous movement appears to be at baseline.  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  Discussed with sister over the phone, she does not feel patient is a candidate for carotid intervention given his already limited quality of life on top of the new stroke causing language deficit and potentially worsening quality of life.  Vitals:   10/02/18 2200 10/02/18 2319 10/03/18 0319 10/03/18 0818  BP:  (!) 135/91 (!) 143/88 (!) 158/94  Pulse: 70 69 67 (!) 53  Resp: 19 13 11 12   Temp:  97.8 F (36.6 C) 97.8 F (36.6 C) 97.9 F (36.6 C)  TempSrc:  Axillary Axillary Axillary  SpO2: 96% 96% 97% 96%  Weight:        CBC:  Recent Labs  Lab 10/01/18 1850 10/02/18 0333  WBC 4.3 4.3  NEUTROABS 2.6  --   HGB 15.8  15.3 14.5  HCT 47.9  45.0 45.0  MCV 90.2 91.3  PLT 82* 70*    Basic Metabolic Panel:  Recent Labs  Lab 10/01/18 1850 10/02/18 0333  NA 143  143 147*  K 4.3  4.2 4.2  CL 109  107 111  CO2 28 27  GLUCOSE 112*  101* 91  BUN 15  17 11   CREATININE 1.00  0.90 0.77  CALCIUM 8.7* 8.7*   Lipid Panel:     Component Value Date/Time   CHOL 117 10/02/2018 0333   TRIG 129 10/02/2018 0333   HDL 32 (L) 10/02/2018 0333   CHOLHDL 3.7 10/02/2018 0333   VLDL 26 10/02/2018 0333   LDLCALC 59 10/02/2018 0333   HgbA1c:  Lab Results  Component Value Date   HGBA1C 5.2 10/02/2018   Urine Drug Screen: No results found for: LABOPIA, COCAINSCRNUR, LABBENZ, AMPHETMU, THCU, LABBARB  Alcohol Level No results found for: ETH  IMAGING Ct Angio Head W Or Wo Contrast  Result Date: 10/01/2018 CLINICAL DATA:  Code stroke.  Old. EXAM: CT ANGIOGRAPHY HEAD AND NECK TECHNIQUE: Multidetector CT imaging of the head and neck was performed using the standard protocol  during bolus administration of intravenous contrast. Multiplanar CT image reconstructions and MIPs were obtained to evaluate the vascular anatomy. Carotid stenosis measurements (when applicable) are obtained utilizing NASCET criteria, using the distal internal carotid diameter as the denominator. CONTRAST:  75mL OMNIPAQUE IOHEXOL 350 MG/ML SOLN COMPARISON:  Prior CT from 06/23/2017. FINDINGS: CT HEAD FINDINGS Brain: Extensive encephalomalacia related to chronic right MCA territory infarcts seen throughout the right cerebral hemisphere, stable from previous. Postoperative changes from overlying right craniotomy. A left frontal approach VP shunt catheter in place with tip terminating near the septum pellucidum. Overall, ventricular size and morphology is relatively similar to previous without hydrocephalus. Ex vacuo dilatation of the right lateral ventricle related to the chronic right frontal encephalomalacia. Dystrophic calcification at the frontal horn of the right lateral ventricle again noted. No acute intracranial hemorrhage. No definite or convincing acute large vessel territory infarct. No mass lesion or extra-axial fluid collection. Chronic 12 mm left-to-right shift of the septum pellucidum related to the right cerebral encephalomalacia noted, stable. Vascular: No hyperdense vessel. Mild calcified atherosclerosis at the skull base. Skull: Prior right hemi craniotomy. Left frontal burr hole with shunt catheter in place. Remote burr holes seen at the bilateral parietal calvarium related  to previous shunt catheters. Scalp soft tissues demonstrate no acute finding. Sinuses: Paranasal sinuses are clear.  No mastoid effusion. Orbits: Left gaze noted. Aspects equals 10. Review of the MIP images confirms the above findings CTA NECK FINDINGS Aortic arch: Visualized aortic arch of normal caliber with normal branch pattern. Minimal plaque about the origin of the great vessels without hemodynamically significant  stenosis. Visualized subclavian arteries widely patent. Right carotid system: Right common and internal carotid arteries widely patent without stenosis, dissection, or occlusion. No significant atheromatous narrowing about the right carotid bifurcation. Right ICA tortuous at the level of the skull base. Left carotid system: Left common carotid artery widely patent from its origin to the bifurcation without stenosis. No significant atheromatous narrowing about the left bifurcation. 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 A radiologic string sign is present. No frank intraluminal thrombus or raised dissection flap identified. Finding is age indeterminate. Distally, left ICA otherwise patent to the skull base without additional stenosis or other vascular abnormality. Vertebral arteries: Left vertebral artery dominant and arises from the left subclavian artery. 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 Right vertebral is diffusely hypoplastic, and is patent within the neck, but nearly occludes at the level of the skull base. Dominant left vertebral artery widely patent without stenosis, dissection, or occlusion. Skeleton: No acute osseous finding. No discrete lytic or blastic osseous lesions. Other neck: No other acute soft tissue abnormality within the neck. Upper chest: Visualized upper chest demonstrates no acute finding. Partially visualized lungs are grossly clear. Review of the MIP images confirms the above findings CTA HEAD FINDINGS Anterior circulation: Petrous, cavernous, and supraclinoid segments widely patent without hemodynamically significant stenosis. ICA termini well perfused. A1 segments widely patent. Normal  anterior communicating artery complex. Anterior cerebral arteries patent to their distal aspects without stenosis. Left M1 widely patent. Normal left MCA bifurcation. Left MCA branches well perfused to their distal aspects. Right MCA hypoplastic and attenuated, consistent with right MCA territory infarct. Negative right MCA bifurcation. No right-sided large vessel occlusion. Posterior circulation: Focal non stenotic plaque noted within the left V4 segment beyond the takeoff of the left PICA. Left V4 segment otherwise widely patent to the vertebrobasilar junction. Left PICA patent. Right vertebral markedly hypoplastic as it courses into the cranial vault, contributing little to the posterior circulation. Right PICA patent. Basilar patent to its distal aspect without stenosis. Superior cerebral arteries patent bilaterally. Both of the posterior cerebral arteries primarily supplied via the basilar and are well perfused to their distal aspects. Venous sinuses: Grossly patent allowing for timing the contrast bolus. Anatomic variants: Dominant left vertebral artery with diffusely hypoplastic right vertebral artery. No intracranial aneurysm. Review of the MIP images confirms the above findings IMPRESSION: CT HEAD IMPRESSION: 1. No acute intracranial abnormality. 2. Aspects equals 10. 3. Chronic encephalomalacia throughout the right MCA territory, consistent with chronic right MCA territory infarct. 4. Left frontal approach VP shunt catheter in place with tip terminating in the left lateral ventricle. Stable ventricular size and morphology without hydrocephalus. CTA HEAD AND NECK IMPRESSION: 1. Negative CTA for emergent large vessel occlusion. 2. 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 No frank intraluminal thrombus or raised  dissection flap identified. 3. No other hemodynamically significant or correctable  stenosis identified about the major arterial vasculature of the head and neck. 4. Diffusely hypoplastic and attenuated right MCA, consistent with chronic right MCA territory infarct. Critical Value/emergent results were called by telephone at the time of interpretation on 10/01/2018 at 7:15 pm to Dr. Milon Dikes , who verbally acknowledged these results. Electronically Signed   By: Rise Mu M.D.   On: 10/01/2018 19:58   Ct Head Wo Contrast  Result Date: 10/02/2018 CLINICAL DATA:  Follow-up examination for acute stroke. EXAM: CT HEAD WITHOUT CONTRAST TECHNIQUE: Contiguous axial images were obtained from the base of the skull through the vertex without intravenous contrast. COMPARISON:  Prior CTs from 10/01/2018. FINDINGS: Brain: Now evident is a focal region of all vein cytotoxic edema involving the posterior left temporal occipital region, consistent with evolving posterior left MCA territory infarct. This is not visualized on previous code stroke CT or CT perfusion. No associated hemorrhage or mass effect. Remainder of the brain is stable in appearance with no other new finding. No other acute large vessel territory infarct. Large chronic right MCA territory infarct with associated encephalomalacia. Left frontal approach shunt catheter in stable position. Stable ventricular size and morphology. No extra-axial fluid collection. Vascular: Residual contrast material seen within the intracranial circulation. Calcified atherosclerosis at the skull base. Skull: Prior right craniotomy.  No new finding. Sinuses/Orbits: Globes and orbital soft tissues demonstrate no acute finding. Previously seen left gaze no longer clearly visualized. Paranasal sinuses and mastoid air cells remain clear. Other: None. IMPRESSION: 1. Evolving hypodensity within the posterior left temporal occipital region is now visible, consistent with evolving left posterior MCA territory infarct. No associated hemorrhage or mass effect. 2.  Otherwise stable head CT. No other new acute intracranial abnormality. Electronically Signed   By: Rise Mu M.D.   On: 10/02/2018 05:26   Ct Angio Neck W Or Wo Contrast  Result Date: 10/01/2018 CLINICAL DATA:  Code stroke.  Old. EXAM: CT ANGIOGRAPHY HEAD AND NECK TECHNIQUE: Multidetector CT imaging of the head and neck was performed using the standard protocol during bolus administration of intravenous contrast. Multiplanar CT image reconstructions and MIPs were obtained to evaluate the vascular anatomy. Carotid stenosis measurements (when applicable) are obtained utilizing NASCET criteria, using the distal internal carotid diameter as the denominator. CONTRAST:  75mL OMNIPAQUE IOHEXOL 350 MG/ML SOLN COMPARISON:  Prior CT from 06/23/2017. FINDINGS: CT HEAD FINDINGS Brain: Extensive encephalomalacia related to chronic right MCA territory infarcts seen throughout the right cerebral hemisphere, stable from previous. Postoperative changes from overlying right craniotomy. A left frontal approach VP shunt catheter in place with tip terminating near the septum pellucidum. Overall, ventricular size and morphology is relatively similar to previous without hydrocephalus. Ex vacuo dilatation of the right lateral ventricle related to the chronic right frontal encephalomalacia. Dystrophic calcification at the frontal horn of the right lateral ventricle again noted. No acute intracranial hemorrhage. No definite or convincing acute large vessel territory infarct. No mass lesion or extra-axial fluid collection. Chronic 12 mm left-to-right shift of the septum pellucidum related to the right cerebral encephalomalacia noted, stable. Vascular: No hyperdense vessel. Mild calcified atherosclerosis at the skull base. Skull: Prior right hemi craniotomy. Left frontal burr hole with shunt catheter in place. Remote burr holes seen at the bilateral parietal calvarium related to previous shunt catheters. Scalp soft tissues  demonstrate no acute finding. Sinuses: Paranasal sinuses are clear.  No mastoid effusion. Orbits: Left gaze noted. Aspects equals 10.  Review of the MIP images confirms the above findings CTA NECK FINDINGS Aortic arch: Visualized aortic arch of normal caliber with normal branch pattern. Minimal plaque about the origin of the great vessels without hemodynamically significant stenosis. Visualized subclavian arteries widely patent. Right carotid system: Right common and internal carotid arteries widely patent without stenosis, dissection, or occlusion. No significant atheromatous narrowing about the right carotid bifurcation. Right ICA tortuous at the level of the skull base. Left carotid system: Left common carotid artery widely patent from its origin to the bifurcation without stenosis. No significant atheromatous narrowing about the left bifurcation. 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 A radiologic string sign is present. No frank intraluminal thrombus or raised dissection flap identified. Finding is age indeterminate. Distally, left ICA otherwise patent to the skull base without additional stenosis or other vascular abnormality. Vertebral arteries: Left vertebral artery dominant and arises from the left subclavian artery. 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 Right vertebral is diffusely hypoplastic, and is patent within the neck, but nearly occludes at the level of the skull base. Dominant left vertebral artery widely patent without stenosis, dissection, or occlusion. Skeleton: No acute osseous finding. No discrete lytic or blastic osseous lesions. Other neck: No other acute soft tissue abnormality within the neck.  Upper chest: Visualized upper chest demonstrates no acute finding. Partially visualized lungs are grossly clear. Review of the MIP images confirms the above findings CTA HEAD FINDINGS Anterior circulation: Petrous, cavernous, and supraclinoid segments widely patent without hemodynamically significant stenosis. ICA termini well perfused. A1 segments widely patent. Normal anterior communicating artery complex. Anterior cerebral arteries patent to their distal aspects without stenosis. Left M1 widely patent. Normal left MCA bifurcation. Left MCA branches well perfused to their distal aspects. Right MCA hypoplastic and attenuated, consistent with right MCA territory infarct. Negative right MCA bifurcation. No right-sided large vessel occlusion. Posterior circulation: Focal non stenotic plaque noted within the left V4 segment beyond the takeoff of the left PICA. Left V4 segment otherwise widely patent to the vertebrobasilar junction. Left PICA patent. Right vertebral markedly hypoplastic as it courses into the cranial vault, contributing little to the posterior circulation. Right PICA patent. Basilar patent to its distal aspect without stenosis. Superior cerebral arteries patent bilaterally. Both of the posterior cerebral arteries primarily supplied via the basilar and are well perfused to their distal aspects. Venous sinuses: Grossly patent allowing for timing the contrast bolus. Anatomic variants: Dominant left vertebral artery with diffusely hypoplastic right vertebral artery. No intracranial aneurysm. Review of the MIP images confirms the above findings IMPRESSION: CT HEAD IMPRESSION: 1. No acute intracranial abnormality. 2. Aspects equals 10. 3. Chronic encephalomalacia throughout the right MCA territory, consistent with chronic right MCA territory infarct. 4. Left frontal approach VP shunt catheter in place with tip terminating in the left lateral ventricle. Stable ventricular size and morphology without  hydrocephalus. CTA HEAD AND NECK IMPRESSION: 1. Negative CTA for emergent large vessel occlusion. 2. 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 No frank intraluminal thrombus or raised dissection flap identified. 3. No other hemodynamically significant or correctable stenosis identified about the major arterial vasculature of the head and neck. 4. Diffusely hypoplastic and attenuated  right MCA, consistent with chronic right MCA territory infarct. Critical Value/emergent results were called by telephone at the time of interpretation on 10/01/2018 at 7:15 pm to Dr. Milon Dikes , who verbally acknowledged these results. Electronically Signed   By: Rise Mu M.D.   On: 10/01/2018 19:58   Ct Cerebral Perfusion W Contrast  Result Date: 10/02/2018 CLINICAL DATA:  Initial evaluation for acute stroke, left-sided gaze with confusion. EXAM: CT PERFUSION BRAIN TECHNIQUE: Multiphase CT imaging of the brain was performed following IV bolus contrast injection. Subsequent parametric perfusion maps were calculated using RAPID software. CONTRAST:  40mL OMNIPAQUE IOHEXOL 350 MG/ML SOLN COMPARISON:  Prior CTA and CT from 10/01/2018. FINDINGS: CT Brain Perfusion Findings: CBF (<30%) Volume: 55mL Perfusion (Tmax>6.0s) volume: 22mL Mismatch Volume: -33mL ASPECTS on noncontrast CT Head: 10 at 7:58 p.m. on 10/01/2018. Infarct Core: There is apparent core infarct overlying the periphery of the right cerebral convexity, felt to be artifactual given the extensive encephalomalacia throughout the right cerebral hemisphere due to the chronic right MCA territory infarct. No visible core infarct within the left cerebral hemisphere. Infarction Location:Negative CT perfusion for acute core infarct within the left cerebral hemisphere. There is elevated cerebral blood volume and cerebral blood flow throughout the left cerebral  hemisphere, likely reflecting asymmetric perfusion as compared to the relative lack of perfusion within the right cerebral hemisphere due to the extensive chronic encephalomalacia and attenuated right middle cerebral artery. Associated decreased T-max seen throughout this region as well. Changes related to underlying seizure could also have this appearance, although the diffuse nature of these findings throughout the left cerebral hemisphere with perhaps not be expected to be seen in the setting of a seizure focus. IMPRESSION: 1. Perfusion mismatch throughout the left cerebral hemisphere with elevated cerebral blood flow, cerebral blood volume, with reduced T-max. While this finding is almost certainly at least in part reflective of perfusion differences as compared to the contralateral right hemisphere that demonstrates extensive chronic right MCA encephalomalacia, possible superimposed changes related to seizure could also have this appearance. Correlation with EEG may be helpful for further evaluation. 2. No core infarct within the left cerebral hemisphere by CT perfusion. Given the technical limitations of this study, a short interval follow-up non-contrast head CT to evaluate for possible interval change may be helpful, and also provide reassurance that in fact no evolving ischemic changes are present. Results were discussed by telephone at the time of interpretation on 10/02/2018 at approximately 2:04 am with Dr. Caryl Pina. Electronically Signed   By: Rise Mu M.D.   On: 10/02/2018 02:33   Dg Chest Port 1 View  Result Date: 10/01/2018 CLINICAL DATA:  Altered mental status EXAM: PORTABLE CHEST 1 VIEW COMPARISON:  None. FINDINGS: Low lung volumes. Left base scarring or atelectasis. Right lung clear. Heart is normal size. No effusions or acute bony abnormality. IMPRESSION: Left base scarring or atelectasis.  Low lung volumes. Electronically Signed   By: Charlett Nose M.D.   On: 10/01/2018 22:00    Ct Head Code Stroke Wo Contrast  Result Date: 10/01/2018 CLINICAL DATA:  Code stroke.  Old. EXAM: CT ANGIOGRAPHY HEAD AND NECK TECHNIQUE: Multidetector CT imaging of the head and neck was performed using the standard protocol during bolus administration of intravenous contrast. Multiplanar CT image reconstructions and MIPs were obtained to evaluate the vascular anatomy. Carotid stenosis measurements (when applicable) are obtained utilizing NASCET criteria, using the distal internal carotid diameter as the denominator. CONTRAST:  75mL OMNIPAQUE IOHEXOL 350 MG/ML SOLN  COMPARISON:  Prior CT from 06/23/2017. FINDINGS: CT HEAD FINDINGS Brain: Extensive encephalomalacia related to chronic right MCA territory infarcts seen throughout the right cerebral hemisphere, stable from previous. Postoperative changes from overlying right craniotomy. A left frontal approach VP shunt catheter in place with tip terminating near the septum pellucidum. Overall, ventricular size and morphology is relatively similar to previous without hydrocephalus. Ex vacuo dilatation of the right lateral ventricle related to the chronic right frontal encephalomalacia. Dystrophic calcification at the frontal horn of the right lateral ventricle again noted. No acute intracranial hemorrhage. No definite or convincing acute large vessel territory infarct. No mass lesion or extra-axial fluid collection. Chronic 12 mm left-to-right shift of the septum pellucidum related to the right cerebral encephalomalacia noted, stable. Vascular: No hyperdense vessel. Mild calcified atherosclerosis at the skull base. Skull: Prior right hemi craniotomy. Left frontal burr hole with shunt catheter in place. Remote burr holes seen at the bilateral parietal calvarium related to previous shunt catheters. Scalp soft tissues demonstrate no acute finding. Sinuses: Paranasal sinuses are clear.  No mastoid effusion. Orbits: Left gaze noted. Aspects equals 10. Review of the MIP  images confirms the above findings CTA NECK FINDINGS Aortic arch: Visualized aortic arch of normal caliber with normal branch pattern. Minimal plaque about the origin of the great vessels without hemodynamically significant stenosis. Visualized subclavian arteries widely patent. Right carotid system: Right common and internal carotid arteries widely patent without stenosis, dissection, or occlusion. No significant atheromatous narrowing about the right carotid bifurcation. Right ICA tortuous at the level of the skull base. Left carotid system: Left common carotid artery widely patent from its origin to the bifurcation without stenosis. No significant atheromatous narrowing about the left bifurcation. 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 A radiologic string sign is present. No frank intraluminal thrombus or raised dissection flap identified. Finding is age indeterminate. Distally, left ICA otherwise patent to the skull base without additional stenosis or other vascular abnormality. Vertebral arteries: Left vertebral artery dominant and arises from the left subclavian artery. 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 Right vertebral is diffusely hypoplastic, and is patent within the neck, but nearly occludes at the level of the skull base. Dominant left vertebral artery widely patent without stenosis, dissection, or occlusion. Skeleton: No acute osseous finding. No discrete lytic or blastic osseous lesions. Other neck: No other acute soft tissue abnormality within the neck. Upper chest: Visualized upper chest demonstrates no acute finding. Partially visualized lungs are grossly clear. Review of the MIP images confirms the  above findings CTA HEAD FINDINGS Anterior circulation: Petrous, cavernous, and supraclinoid segments widely patent without hemodynamically significant stenosis. ICA termini well perfused. A1 segments widely patent. Normal anterior communicating artery complex. Anterior cerebral arteries patent to their distal aspects without stenosis. Left M1 widely patent. Normal left MCA bifurcation. Left MCA branches well perfused to their distal aspects. Right MCA hypoplastic and attenuated, consistent with right MCA territory infarct. Negative right MCA bifurcation. No right-sided large vessel occlusion. Posterior circulation: Focal non stenotic plaque noted within the left V4 segment beyond the takeoff of the left PICA. Left V4 segment otherwise widely patent to the vertebrobasilar junction. Left PICA patent. Right vertebral markedly hypoplastic as it courses into the cranial vault, contributing little to  the posterior circulation. Right PICA patent. Basilar patent to its distal aspect without stenosis. Superior cerebral arteries patent bilaterally. Both of the posterior cerebral arteries primarily supplied via the basilar and are well perfused to their distal aspects. Venous sinuses: Grossly patent allowing for timing the contrast bolus. Anatomic variants: Dominant left vertebral artery with diffusely hypoplastic right vertebral artery. No intracranial aneurysm. Review of the MIP images confirms the above findings IMPRESSION: CT HEAD IMPRESSION: 1. No acute intracranial abnormality. 2. Aspects equals 10. 3. Chronic encephalomalacia throughout the right MCA territory, consistent with chronic right MCA territory infarct. 4. Left frontal approach VP shunt catheter in place with tip terminating in the left lateral ventricle. Stable ventricular size and morphology without hydrocephalus. CTA HEAD AND NECK IMPRESSION: 1. Negative CTA for emergent large vessel occlusion. 2. Short-segment vessel irregularity and high-grade near  occlusive stenosis involving the mid left ICA, concerning for arterial dissection, age indeterminate. No frank intraluminal thrombus or raised dissection flap identified. 3. No other hemodynamically significant or correctable stenosis identified about the major arterial vasculature of the head and neck. 4. Diffusely hypoplastic and attenuated right MCA, consistent with chronic right MCA territory infarct. Critical Value/emergent results were called by telephone at the time of interpretation on 10/01/2018 at 7:15 pm to Dr. Milon Dikes , who verbally acknowledged these results. Electronically Signed   By: Rise Mu M.D.   On: 10/01/2018 19:58   Vas [place] Lower Extremity Venous (dvt)  Result Date: 10/02/2018  Lower Venous Study Indications: Swelling.  Limitations: Body habitus, poor ultrasound/tissue interface and poor patient compliance. Performing Technologist: Gertie Fey MHA, RDMS, RVT, RDCS  Examination Guidelines: A complete evaluation includes B-mode imaging, spectral Doppler, color Doppler, and power Doppler as needed of all accessible portions of each vessel. Bilateral testing is considered an integral part of a complete examination. Limited examinations for reoccurring indications may be performed as noted.  +---------+---------------+---------+-----------+----------+--------------+ RIGHT    CompressibilityPhasicitySpontaneityPropertiesThrombus Aging +---------+---------------+---------+-----------+----------+--------------+ CFV      Full           Yes      Yes                                 +---------+---------------+---------+-----------+----------+--------------+ SFJ      Full                                                        +---------+---------------+---------+-----------+----------+--------------+ FV Prox  Full                                                        +---------+---------------+---------+-----------+----------+--------------+ FV Mid    Full                                                        +---------+---------------+---------+-----------+----------+--------------+ FV DistalFull                                                        +---------+---------------+---------+-----------+----------+--------------+  POP      Full           Yes      Yes                                 +---------+---------------+---------+-----------+----------+--------------+ Limited visualization of the right posterior tibial and peroneal veins.  +---------+---------------+---------+-----------+----------+--------------+ LEFT     CompressibilityPhasicitySpontaneityPropertiesThrombus Aging +---------+---------------+---------+-----------+----------+--------------+ CFV      Full                                                        +---------+---------------+---------+-----------+----------+--------------+ SFJ      Full                                                        +---------+---------------+---------+-----------+----------+--------------+ FV Prox  Full                                                        +---------+---------------+---------+-----------+----------+--------------+ FV Mid   Full                                                        +---------+---------------+---------+-----------+----------+--------------+ FV DistalFull                                                        +---------+---------------+---------+-----------+----------+--------------+ POP      Full                                                        +---------+---------------+---------+-----------+----------+--------------+ Limited visualization of the left posterior tibial and peroneal veins.    Summary: Right: There is no evidence of deep vein thrombosis in the lower extremity. However, portions of this examination were limited- see technologist comments above. No cystic structure found in the popliteal  fossa. Left: There is no evidence of deep vein thrombosis in the lower extremity. However, portions of this examination were limited- see technologist comments above. No cystic structure found in the popliteal fossa.  *See table(s) above for measurements and observations.    Preliminary     PHYSICAL EXAM  Temp:  [97.8 F (36.6 C)-98.2 F (36.8 C)] 98.2 F (36.8 C) (09/09 1154) Pulse Rate:  [53-92] 92 (09/09 1154) Resp:  [11-19] 14 (09/09 1154) BP: (130-158)/(88-99) 133/99 (09/09 1154) SpO2:  [96 %-99 %] 99 % (09/09 1154)  General - Well nourished, well developed, in no apparent distress.  Ophthalmologic - fundi  not visualized due to noncooperation.  Cardiovascular - Regular rhythm but tachycardia.  Neuro - awake alert, good eye contact, able to track. Receptive aphasia, only able to answer questions for his name, not following other verbal commands. Not name or repeat as requested. However, he has spontaneous speech and able to make sentence without word salad. Right hemianopia and right neglect. No gaze preference, EOMI, PERRL. Left facial droop. Not cooperative on tongue protrusion. Left UE 0/5 with increased tone, LLE 0/5 but triple reflex on babinski test, increased tone. Spontaneous movement on the RUE and RLE. Sensation, coordination and gait not tested.   ASSESSMENT/PLAN Mr. Patrick PITSENBARGER is a 48 y.o. 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   Stroke:   Likely L MCA infarct embolic secondary to left mid ICA short segment high grade stenosis. However, embolic source is still in DDx given his extensive right MCA infarct hx   Code Stroke CT head No acute abnormality. Chronic encephalomalacia R MCA territory. L frontal lobe VP shunt in L lateral ventricle. No hydro. ASPECTS 10.     CTA head & neck no ELVO. Mid L ICA  high-grade stenosis/near-occlusion/dissection. Hypoplastic R MCA.   CT head repeat evolving hypodensity posterior L temporal occipital region c/w L MCA infarct   MRI not able to perform due to VPS may not compatible with MRI  CT head repeat pending in am  Would not pursue further embolic work up after GOC discussion with sister  LE Doppler  Neg DVT  2D Echo EF 55-60%. No source of embolus   LDL 59  HgbA1c 5.2  Hypercoagulable work-up pending  SCDs for VTE prophylaxis  aspirin 81 mg daily prior to admission, now on aspirin 300 PR daily. Given thrombocytopenia, recommend ASA 81mg  once po access. Continue ASA 81mg  on discharge.   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   Cause of stroke unknown  on aspirin 81  Hx seizures  Home meds: vimpat 100 bid, depakote 1000 bid  Last seizure June 2020  Followed with Dr. Karel Jarvis at [facility]  EEG no sz  Now on vimpat, keppra and depacon  Will DC Keppra given no seizure this time and with emotional swing  Hypertension  Home meds:  Metoprolol 25 bid  Stable . Permissive hypertension (OK if < 220/120) but gradually normalize in 3-5 days . Long-term BP goal 130-150 given left ICA high-grade stenosis  Hyperlipidemia  Home meds:  lipitor 80  Resume in hospital once able to swallow    LDL 59, goal < 70  Continue statin at discharge  Dysphagia . Secondary to  stroke . NPO . Speech on board . Patient and family wish no PEG    Other Stroke Risk Factors  Hx ETOH use, none now  Family hx stroke (maternal uncle)  Cardiomyopathy   Hx DVT, PE  Other Active Problems  GERD  Thrombocytopenia platelet 82->70  Follow up with Dr. Karel Jarvis at [facility] day # 2  I spent  35 minutes in total face-to-face time with the patient, more than 50% of which was spent in counseling and coordination of care, reviewing test results, images and medication, and discussing the diagnosis of left MCA stroke, old right MCA large stroke, history of seizure, history of VP shunt, dysphagia, treatment plan and potential prognosis. This patient's care requiresreview of multiple databases, neurological assessment, discussion with family, other specialists and medical decision making of high complexity. I had long discussion with sister over the phone, updated pt current condition, treatment plan and potential prognosis. She expressed understanding and appreciation. We had extensive GOC discussion about pt condition.  Marvel Plan, MD PhD Stroke Neurology 10/03/2018 6:38 PM   To contact Stroke Continuity provider, please refer to [email or web address]. After hours, contact General Neurology

## 2018-10-03 NOTE — Progress Notes (Signed)
SLP Cancellation Note  Patient Details Name: Patrick Franklin MRN: [id number] DOB: [birth year]   Cancelled treatment:       Reason Eval/Treat Not Completed: Patient declined, no reason specified. SLP returned to re-attempt evaluations. [name] sister is no longer present. Pt was awake and alert, seated in recliner. Pt did verbalize today, with clear intelligible speech. Unable to answer orientation questions at this time. Pt continued to tighten closed lips and turn his head away from po trials (multiple consistencies attempted). RN informed that pt refused to participate again. Will continue efforts. RN informed.  Jehieli Brassell B. Quentin Ore [facility], [place] Speech Language Pathologist [phone]  Shonna Chock 10/03/2018, 2:17 PM

## 2018-10-03 NOTE — Plan of Care (Signed)
°  Problem: Nutrition: Goal: Adequate nutrition will be maintained Outcome: Not Progressing

## 2018-10-03 NOTE — Progress Notes (Signed)
Inpatient Rehabilitation Admissions Coordinator  I have requested an inpt rehab consult of Dr. Sloan Leiter.  Danne Baxter, RN, MSN Rehab Admissions Coordinator [phone] 10/03/2018 1:12 PM
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
## 2018-10-03 NOTE — Progress Notes (Signed)
PROGRESS NOTE    Patrick Franklin  NFA:[id number] DOB: 04-24-1970 DOA: 10/01/2018 PCP: Doreene Nest, NP    Brief Narrative:  Unfortunate 48 year old gentleman with history of large right MCA stroke in 2017 with large right cerebral encephalomalacia, hydrocephalus status post VP shunt, residual left-sided hemiplegia and left-sided hemianopsia, seizure disorder currently on Depakote and Vimpat, neurogenic bladder, asthma, DVT, GERD, hypertension and depression, poor mobility at baseline brought to the hospital with garbled speech and left-sided gauge.  Patient lives with his sister and brother-in-law, they were taking him for outing on the cart, he started looking on the left side as well as was talking something that did not make sense so they brought him to the ER. In the emergency room, patient was aphasic and encephalopathic.  Does have new left MCA stroke. Now with selective mutism.  Assessment & Plan:   Principal Problem:   Neurological deficit present Active Problems:   Essential hypertension   Seizure disorder (HCC)   Thrombocytopenia (HCC)   Neurogenic bladder   Cerebral embolism with cerebral infarction  Acute left MCA stroke with aphasia, left gaze preference and encephalopathy in a patient with history of devastating right MCA stroke: Clinical findings, aphasia, dysarthria and left gaze preference.  Chronic left lower facial weakness.  Left arm weakness.  Left leg weakness. CT head findings, initial negative.  Subsequent CTA showed left MCA stroke.  Old right MCA stroke. MRI of the brain, unable to do because of VP shunt. CTA of the head and neck, no significant LVO. 2D echocardiogram, normal.  Antiplatelet therapy, on aspirin.  Continue. LDL 59, on Lipitor 80 at home.  Will resume when able to take by mouth. Hemoglobin A1c,5.2 DVT prophylaxis, Lovenox  Acute metabolic encephalopathy, suspect seizure versus bilateral stroke: Currently on increased dose of Depakote  through IV.  Continued Keppra. Spot EEG without any evidence of epileptiform focus. Most likely due to stroke. Now with selective mutism, may have underlying depression.  Hypertension: Blood pressures stable.  Permissive hypertension.  Thrombocytopenia: Chronic thrombocytopenia and stable.  Advance care planning: Patient Sister [name] at bedside.  HC POA. Patient has DNR form updated. Patient also has [place] medically scope of treatment form that is attached in the chart. He has wishes for DNR, limited intervention, no PEG tube, no mechanical ventilation.   DVT prophylaxis: Lovenox subcu Code Status: DNR Family Communication: Patient's sister Ms. [name] at bedside Disposition Plan: Unknown at this time.   Consultants:   Neurology  Procedures:   EEG  Antimicrobials:   None   Subjective: Patient seen and examined. He talks with very clear voice intermittently. He did not respond for 10 minutes and after that he said" are you crazy" . With sister he did not talk for long time and he started shouting " I need to go bathroom, asked to pee or poop, he shouted I need to go both "  Spontaneously moving right side. Left side with chronic weakness.   Objective: Vitals:   10/02/18 2200 10/02/18 2319 10/03/18 0319 10/03/18 0818  BP:  (!) 135/91 (!) 143/88 (!) 158/94  Pulse: 70 69 67 (!) 53  Resp: 19 13 11 12   Temp:  97.8 F (36.6 C) 97.8 F (36.6 C) 97.9 F (36.6 C)  TempSrc:  Axillary Axillary Axillary  SpO2: 96% 96% 97% 96%  Weight:        Intake/Output Summary (Last 24 hours) at 10/03/2018 1108 Last data filed at 10/03/2018 0900 Gross per 24 hour  Intake  0 ml  Output 1800 ml  Net -1800 ml   Filed Weights   10/01/18 1913  Weight: 88.3 kg    Examination:  General exam: Appears calm and comfortable, on room air.  Respiratory system: Clear to auscultation. Respiratory effort normal.  No added sounds. Cardiovascular system: S1 & S2 heard, RRR. No JVD,  murmurs, rubs, gallops or clicks. No pedal edema. Gastrointestinal system: Abdomen is nondistended, soft and nontender. No organomegaly or masses felt. Normal bowel sounds heard. Central nervous system: talks episodically with clear voice. Chronic Right facial droop. Does not follow instructions but spontaneously moving right side. Stiffness and unable to move left side. Skin: No rashes, lesions or ulcers Psychiatry: Judgement and insight appear compromised.      Data Reviewed: I have personally reviewed following labs and imaging studies  CBC: Recent Labs  Lab 10/01/18 1850 10/02/18 0333  WBC 4.3 4.3  NEUTROABS 2.6  --   HGB 15.8   15.3 14.5  HCT 47.9   45.0 45.0  MCV 90.2 91.3  PLT 82* 70*   Basic Metabolic Panel: Recent Labs  Lab 10/01/18 1850 10/02/18 0333  NA 143   143 147*  K 4.3   4.2 4.2  CL 109   107 111  CO2 28 27  GLUCOSE 112*   101* 91  BUN 15   17 11   CREATININE 1.00   0.90 0.77  CALCIUM 8.7* 8.7*   GFR: Estimated Creatinine Clearance: 129.1 mL/min (by C-G formula based on SCr of 0.77 mg/dL). Liver Function Tests: Recent Labs  Lab 10/01/18 1850  AST 47*  ALT 48*  ALKPHOS 77  BILITOT 0.8  PROT 5.6*  ALBUMIN 3.3*   No results for input(s): LIPASE, AMYLASE in the last 168 hours. Recent Labs  Lab 10/02/18 0952  AMMONIA 24   Coagulation Profile: Recent Labs  Lab 10/01/18 1850  INR 1.0   Cardiac Enzymes: No results for input(s): CKTOTAL, CKMB, CKMBINDEX, TROPONINI in the last 168 hours. BNP (last 3 results) No results for input(s): PROBNP in the last 8760 hours. HbA1C: Recent Labs    10/02/18 0333  HGBA1C 5.2   CBG: Recent Labs  Lab 10/01/18 1846  GLUCAP 91   Lipid Profile: Recent Labs    10/02/18 0333  CHOL 117  HDL 32*  LDLCALC 59  TRIG 161  CHOLHDL 3.7   Thyroid Function Tests: No results for input(s): TSH, T4TOTAL, FREET4, T3FREE, THYROIDAB in the last 72 hours. Anemia Panel: No results for input(s): VITAMINB12,  FOLATE, FERRITIN, TIBC, IRON, RETICCTPCT in the last 72 hours. Sepsis Labs: No results for input(s): PROCALCITON, LATICACIDVEN in the last 168 hours.  Recent Results (from the past 240 hour(s))  SARS Coronavirus 2 [facility] order, Performed in [facility] hospital lab) Nasopharyngeal Nasopharyngeal Swab     Status: None   Collection Time: 10/01/18  7:34 PM   Specimen: Nasopharyngeal Swab  Result Value Ref Range Status   SARS Coronavirus 2 NEGATIVE NEGATIVE Final    Comment: (NOTE) If result is NEGATIVE SARS-CoV-2 target nucleic acids are NOT DETECTED. The SARS-CoV-2 RNA is generally detectable in upper and lower  respiratory specimens during the acute phase of infection. The lowest  concentration of SARS-CoV-2 viral copies this assay can detect is 250  copies / mL. A negative result does not preclude SARS-CoV-2 infection  and should not be used as the sole basis for treatment or other  patient management decisions.  A negative result may occur with  improper specimen collection /  handling, submission of specimen other  than nasopharyngeal swab, presence of viral mutation(s) within the  areas targeted by this assay, and inadequate number of viral copies  (<250 copies / mL). A negative result must be combined with clinical  observations, patient history, and epidemiological information. If result is POSITIVE SARS-CoV-2 target nucleic acids are DETECTED. The SARS-CoV-2 RNA is generally detectable in upper and lower  respiratory specimens dur ing the acute phase of infection.  Positive  results are indicative of active infection with SARS-CoV-2.  Clinical  correlation with patient history and other diagnostic information is  necessary to determine patient infection status.  Positive results do  not rule out bacterial infection or co-infection with other viruses. If result is PRESUMPTIVE POSTIVE SARS-CoV-2 nucleic acids MAY BE PRESENT.   A presumptive positive result was obtained on the  submitted specimen  and confirmed on repeat testing.  While 2019 novel coronavirus  (SARS-CoV-2) nucleic acids may be present in the submitted sample  additional confirmatory testing may be necessary for epidemiological  and / or clinical management purposes  to differentiate between  SARS-CoV-2 and other Sarbecovirus currently known to infect humans.  If clinically indicated additional testing with an alternate test  methodology [phone]) is advised. The SARS-CoV-2 RNA is generally  detectable in upper and lower respiratory sp ecimens during the acute  phase of infection. The expected result is Negative. Fact Sheet for Patients:  StrictlyIdeas.no Fact Sheet for Healthcare Providers: BankingDealers.co.za This test is not yet approved or cleared by the [place] FDA and has been authorized for detection and/or diagnosis of SARS-CoV-2 by FDA under an Emergency Use Authorization (EUA).  This EUA will remain in effect (meaning this test can be used) for the duration of the COVID-19 declaration under Section 564(b)(1) of the Act, 21 U.S.C. section 360bbb-3(b)(1), unless the authorization is terminated or revoked sooner. Performed at [facility], [place] [address] [place], [place] [address]          Radiology Studies: Ct Angio Head W Or Wo Contrast  Result Date: 10/01/2018 CLINICAL DATA:  Code stroke.  Old. EXAM: CT ANGIOGRAPHY HEAD AND NECK TECHNIQUE: Multidetector CT imaging of the head and neck was performed using the standard protocol during bolus administration of intravenous contrast. Multiplanar CT image reconstructions and MIPs were obtained to evaluate the vascular anatomy. Carotid stenosis measurements (when applicable) are obtained utilizing NASCET criteria, using the distal internal carotid diameter as the denominator. CONTRAST:  19mL OMNIPAQUE IOHEXOL 350 MG/ML SOLN COMPARISON:  Prior CT from 06/23/2017. FINDINGS: CT HEAD  FINDINGS Brain: Extensive encephalomalacia related to chronic right MCA territory infarcts seen throughout the right cerebral hemisphere, stable from previous. Postoperative changes from overlying right craniotomy. A left frontal approach VP shunt catheter in place with tip terminating near the septum pellucidum. Overall, ventricular size and morphology is relatively similar to previous without hydrocephalus. Ex vacuo dilatation of the right lateral ventricle related to the chronic right frontal encephalomalacia. Dystrophic calcification at the frontal horn of the right lateral ventricle again noted. No acute intracranial hemorrhage. No definite or convincing acute large vessel territory infarct. No mass lesion or extra-axial fluid collection. Chronic 12 mm left-to-right shift of the septum pellucidum related to the right cerebral encephalomalacia noted, stable. Vascular: No hyperdense vessel. Mild calcified atherosclerosis at the skull base. Skull: Prior right hemi craniotomy. Left frontal burr hole with shunt catheter in place. Remote burr holes seen at the bilateral parietal calvarium related to previous shunt catheters. Scalp soft tissues  demonstrate no acute finding. Sinuses: Paranasal sinuses are clear.  No mastoid effusion. Orbits: Left gaze noted. Aspects equals 10. Review of the MIP images confirms the above findings CTA NECK FINDINGS Aortic arch: Visualized aortic arch of normal caliber with normal branch pattern. Minimal plaque about the origin of the great vessels without hemodynamically significant stenosis. Visualized subclavian arteries widely patent. Right carotid system: Right common and internal carotid arteries widely patent without stenosis, dissection, or occlusion. No significant atheromatous narrowing about the right carotid bifurcation. Right ICA tortuous at the level of the skull base. Left carotid system: Left common carotid artery widely patent from its origin to the bifurcation without  stenosis. No significant atheromatous narrowing about the left bifurcation. 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 A radiologic string sign is present. No frank intraluminal thrombus or raised dissection flap identified. Finding is age indeterminate. Distally, left ICA otherwise patent to the skull base without additional stenosis or other vascular abnormality. Vertebral arteries: Left vertebral artery dominant and arises from the left subclavian artery. 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 Right vertebral is diffusely hypoplastic, and is patent within the neck, but nearly occludes at the level of the skull base. Dominant left vertebral artery widely patent without stenosis, dissection, or occlusion. Skeleton: No acute osseous finding. No discrete lytic or blastic osseous lesions. Other neck: No other acute soft tissue abnormality within the neck. Upper chest: Visualized upper chest demonstrates no acute finding. Partially visualized lungs are grossly clear. Review of the MIP images confirms the above findings CTA HEAD FINDINGS Anterior circulation: Petrous, cavernous, and supraclinoid segments widely patent without hemodynamically significant stenosis. ICA termini well perfused. A1 segments widely patent. Normal anterior communicating artery complex. Anterior cerebral arteries patent to their distal aspects without stenosis. Left M1 widely patent. Normal left MCA bifurcation. Left MCA branches well perfused to their distal aspects. Right MCA hypoplastic and attenuated, consistent with right MCA territory infarct. Negative right MCA bifurcation. No right-sided large vessel occlusion. Posterior circulation: Focal non  stenotic plaque noted within the left V4 segment beyond the takeoff of the left PICA. Left V4 segment otherwise widely patent to the vertebrobasilar junction. Left PICA patent. Right vertebral markedly hypoplastic as it courses into the cranial vault, contributing little to the posterior circulation. Right PICA patent. Basilar patent to its distal aspect without stenosis. Superior cerebral arteries patent bilaterally. Both of the posterior cerebral arteries primarily supplied via the basilar and are well perfused to their distal aspects. Venous sinuses: Grossly patent allowing for timing the contrast bolus. Anatomic variants: Dominant left vertebral artery with diffusely hypoplastic right vertebral artery. No intracranial aneurysm. Review of the MIP images confirms the above findings IMPRESSION: CT HEAD IMPRESSION: 1. No acute intracranial abnormality. 2. Aspects equals 10. 3. Chronic encephalomalacia throughout the right MCA territory, consistent with chronic right MCA territory infarct. 4. Left frontal approach VP shunt catheter in place with tip terminating in the left lateral ventricle. Stable ventricular size and morphology without hydrocephalus. CTA HEAD AND NECK IMPRESSION: 1. Negative CTA for emergent large vessel occlusion. 2. 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 No frank intraluminal thrombus or raised dissection flap identified. 3. No other hemodynamically  significant or correctable stenosis identified about the major arterial vasculature of the head and neck. 4. Diffusely hypoplastic and attenuated right MCA, consistent with chronic right MCA territory infarct. Critical Value/emergent results were called by telephone at the time of interpretation on 10/01/2018 at 7:15 pm to Dr. Milon DikesASHISH ARORA , who verbally acknowledged these results. Electronically Signed   By: Rise MuBenjamin  McClintock M.D.    On: 10/01/2018 19:58   Ct Head Wo Contrast  Result Date: 10/02/2018 CLINICAL DATA:  Follow-up examination for acute stroke. EXAM: CT HEAD WITHOUT CONTRAST TECHNIQUE: Contiguous axial images were obtained from the base of the skull through the vertex without intravenous contrast. COMPARISON:  Prior CTs from 10/01/2018. FINDINGS: Brain: Now evident is a focal region of all vein cytotoxic edema involving the posterior left temporal occipital region, consistent with evolving posterior left MCA territory infarct. This is not visualized on previous code stroke CT or CT perfusion. No associated hemorrhage or mass effect. Remainder of the brain is stable in appearance with no other new finding. No other acute large vessel territory infarct. Large chronic right MCA territory infarct with associated encephalomalacia. Left frontal approach shunt catheter in stable position. Stable ventricular size and morphology. No extra-axial fluid collection. Vascular: Residual contrast material seen within the intracranial circulation. Calcified atherosclerosis at the skull base. Skull: Prior right craniotomy.  No new finding. Sinuses/Orbits: Globes and orbital soft tissues demonstrate no acute finding. Previously seen left gaze no longer clearly visualized. Paranasal sinuses and mastoid air cells remain clear. Other: None. IMPRESSION: 1. Evolving hypodensity within the posterior left temporal occipital region is now visible, consistent with evolving left posterior MCA territory infarct. No associated hemorrhage or mass effect. 2. Otherwise stable head CT. No other new acute intracranial abnormality. Electronically Signed   By: Rise MuBenjamin  McClintock M.D.   On: 10/02/2018 05:26   Ct Angio Neck W Or Wo Contrast  Result Date: 10/01/2018 CLINICAL DATA:  Code stroke.  Old. EXAM: CT ANGIOGRAPHY HEAD AND NECK TECHNIQUE: Multidetector CT imaging of the head and neck was performed using the standard protocol during bolus administration of  intravenous contrast. Multiplanar CT image reconstructions and MIPs were obtained to evaluate the vascular anatomy. Carotid stenosis measurements (when applicable) are obtained utilizing NASCET criteria, using the distal internal carotid diameter as the denominator. CONTRAST:  75mL OMNIPAQUE IOHEXOL 350 MG/ML SOLN COMPARISON:  Prior CT from 06/23/2017. FINDINGS: CT HEAD FINDINGS Brain: Extensive encephalomalacia related to chronic right MCA territory infarcts seen throughout the right cerebral hemisphere, stable from previous. Postoperative changes from overlying right craniotomy. A left frontal approach VP shunt catheter in place with tip terminating near the septum pellucidum. Overall, ventricular size and morphology is relatively similar to previous without hydrocephalus. Ex vacuo dilatation of the right lateral ventricle related to the chronic right frontal encephalomalacia. Dystrophic calcification at the frontal horn of the right lateral ventricle again noted. No acute intracranial hemorrhage. No definite or convincing acute large vessel territory infarct. No mass lesion or extra-axial fluid collection. Chronic 12 mm left-to-right shift of the septum pellucidum related to the right cerebral encephalomalacia noted, stable. Vascular: No hyperdense vessel. Mild calcified atherosclerosis at the skull base. Skull: Prior right hemi craniotomy. Left frontal burr hole with shunt catheter in place. Remote burr holes seen at the bilateral parietal calvarium related to previous shunt catheters. Scalp soft tissues demonstrate no acute finding. Sinuses: Paranasal sinuses are clear.  No mastoid effusion. Orbits: Left gaze noted. Aspects equals 10. Review of the MIP images confirms the above  findings CTA NECK FINDINGS Aortic arch: Visualized aortic arch of normal caliber with normal branch pattern. Minimal plaque about the origin of the great vessels without hemodynamically significant stenosis. Visualized subclavian  arteries widely patent. Right carotid system: Right common and internal carotid arteries widely patent without stenosis, dissection, or occlusion. No significant atheromatous narrowing about the right carotid bifurcation. Right ICA tortuous at the level of the skull base. Left carotid system: Left common carotid artery widely patent from its origin to the bifurcation without stenosis. No significant atheromatous narrowing about the left bifurcation. 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 A radiologic string sign is present. No frank intraluminal thrombus or raised dissection flap identified. Finding is age indeterminate. Distally, left ICA otherwise patent to the skull base without additional stenosis or other vascular abnormality. Vertebral arteries: Left vertebral artery dominant and arises from the left subclavian artery. 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 Right vertebral is diffusely hypoplastic, and is patent within the neck, but nearly occludes at the level of the skull base. Dominant left vertebral artery widely patent without stenosis, dissection, or occlusion. Skeleton: No acute osseous finding. No discrete lytic or blastic osseous lesions. Other neck: No other acute soft tissue abnormality within the neck. Upper chest: Visualized upper chest demonstrates no acute finding. Partially visualized lungs are grossly clear. Review of the MIP images confirms the above findings CTA HEAD FINDINGS Anterior circulation: Petrous, cavernous, and supraclinoid segments widely patent without hemodynamically significant stenosis. ICA termini well perfused. A1 segments widely patent. Normal anterior communicating artery  complex. Anterior cerebral arteries patent to their distal aspects without stenosis. Left M1 widely patent. Normal left MCA bifurcation. Left MCA branches well perfused to their distal aspects. Right MCA hypoplastic and attenuated, consistent with right MCA territory infarct. Negative right MCA bifurcation. No right-sided large vessel occlusion. Posterior circulation: Focal non stenotic plaque noted within the left V4 segment beyond the takeoff of the left PICA. Left V4 segment otherwise widely patent to the vertebrobasilar junction. Left PICA patent. Right vertebral markedly hypoplastic as it courses into the cranial vault, contributing little to the posterior circulation. Right PICA patent. Basilar patent to its distal aspect without stenosis. Superior cerebral arteries patent bilaterally. Both of the posterior cerebral arteries primarily supplied via the basilar and are well perfused to their distal aspects. Venous sinuses: Grossly patent allowing for timing the contrast bolus. Anatomic variants: Dominant left vertebral artery with diffusely hypoplastic right vertebral artery. No intracranial aneurysm. Review of the MIP images confirms the above findings IMPRESSION: CT HEAD IMPRESSION: 1. No acute intracranial abnormality. 2. Aspects equals 10. 3. Chronic encephalomalacia throughout the right MCA territory, consistent with chronic right MCA territory infarct. 4. Left frontal approach VP shunt catheter in place with tip terminating in the left lateral ventricle. Stable ventricular size and morphology without hydrocephalus. CTA HEAD AND NECK IMPRESSION: 1. Negative CTA for emergent large vessel occlusion. 2. 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 No frank intraluminal thrombus or raised dissection flap identified. 3. No other hemodynamically significant or correctable stenosis identified about the  major arterial vasculature of the head and neck. 4. Diffusely hypoplastic and attenuated right MCA, consistent with chronic right MCA  territory infarct. Critical Value/emergent results were called by telephone at the time of interpretation on 10/01/2018 at 7:15 pm to Dr. Milon Dikes , who verbally acknowledged these results. Electronically Signed   By: Rise Mu M.D.   On: 10/01/2018 19:58   Ct Cerebral Perfusion W Contrast  Result Date: 10/02/2018 CLINICAL DATA:  Initial evaluation for acute stroke, left-sided gaze with confusion. EXAM: CT PERFUSION BRAIN TECHNIQUE: Multiphase CT imaging of the brain was performed following IV bolus contrast injection. Subsequent parametric perfusion maps were calculated using RAPID software. CONTRAST:  40mL OMNIPAQUE IOHEXOL 350 MG/ML SOLN COMPARISON:  Prior CTA and CT from 10/01/2018. FINDINGS: CT Brain Perfusion Findings: CBF (<30%) Volume: 55mL Perfusion (Tmax>6.0s) volume: 22mL Mismatch Volume: -33mL ASPECTS on noncontrast CT Head: 10 at 7:58 p.m. on 10/01/2018. Infarct Core: There is apparent core infarct overlying the periphery of the right cerebral convexity, felt to be artifactual given the extensive encephalomalacia throughout the right cerebral hemisphere due to the chronic right MCA territory infarct. No visible core infarct within the left cerebral hemisphere. Infarction Location:Negative CT perfusion for acute core infarct within the left cerebral hemisphere. There is elevated cerebral blood volume and cerebral blood flow throughout the left cerebral hemisphere, likely reflecting asymmetric perfusion as compared to the relative lack of perfusion within the right cerebral hemisphere due to the extensive chronic encephalomalacia and attenuated right middle cerebral artery. Associated decreased T-max seen throughout this region as well. Changes related to underlying seizure could also have this appearance, although the diffuse nature of these findings  throughout the left cerebral hemisphere with perhaps not be expected to be seen in the setting of a seizure focus. IMPRESSION: 1. Perfusion mismatch throughout the left cerebral hemisphere with elevated cerebral blood flow, cerebral blood volume, with reduced T-max. While this finding is almost certainly at least in part reflective of perfusion differences as compared to the contralateral right hemisphere that demonstrates extensive chronic right MCA encephalomalacia, possible superimposed changes related to seizure could also have this appearance. Correlation with EEG may be helpful for further evaluation. 2. No core infarct within the left cerebral hemisphere by CT perfusion. Given the technical limitations of this study, a short interval follow-up non-contrast head CT to evaluate for possible interval change may be helpful, and also provide reassurance that in fact no evolving ischemic changes are present. Results were discussed by telephone at the time of interpretation on 10/02/2018 at approximately 2:04 am with Dr. Caryl Pina. Electronically Signed   By: Rise Mu M.D.   On: 10/02/2018 02:33   Dg Chest Port 1 View  Result Date: 10/01/2018 CLINICAL DATA:  Altered mental status EXAM: PORTABLE CHEST 1 VIEW COMPARISON:  None. FINDINGS: Low lung volumes. Left base scarring or atelectasis. Right lung clear. Heart is normal size. No effusions or acute bony abnormality. IMPRESSION: Left base scarring or atelectasis.  Low lung volumes. Electronically Signed   By: Charlett Nose M.D.   On: 10/01/2018 22:00   Ct Head Code Stroke Wo Contrast  Result Date: 10/01/2018 CLINICAL DATA:  Code stroke.  Old. EXAM: CT ANGIOGRAPHY HEAD AND NECK TECHNIQUE: Multidetector CT imaging of the head and neck was performed using the standard protocol during bolus administration of intravenous contrast. Multiplanar CT image reconstructions and MIPs were obtained to evaluate the vascular anatomy. Carotid stenosis measurements  (when applicable) are obtained utilizing NASCET criteria, using the distal internal carotid diameter as the denominator. CONTRAST:  75mL OMNIPAQUE IOHEXOL 350 MG/ML SOLN COMPARISON:  Prior CT from 06/23/2017. FINDINGS:  CT HEAD FINDINGS Brain: Extensive encephalomalacia related to chronic right MCA territory infarcts seen throughout the right cerebral hemisphere, stable from previous. Postoperative changes from overlying right craniotomy. A left frontal approach VP shunt catheter in place with tip terminating near the septum pellucidum. Overall, ventricular size and morphology is relatively similar to previous without hydrocephalus. Ex vacuo dilatation of the right lateral ventricle related to the chronic right frontal encephalomalacia. Dystrophic calcification at the frontal horn of the right lateral ventricle again noted. No acute intracranial hemorrhage. No definite or convincing acute large vessel territory infarct. No mass lesion or extra-axial fluid collection. Chronic 12 mm left-to-right shift of the septum pellucidum related to the right cerebral encephalomalacia noted, stable. Vascular: No hyperdense vessel. Mild calcified atherosclerosis at the skull base. Skull: Prior right hemi craniotomy. Left frontal burr hole with shunt catheter in place. Remote burr holes seen at the bilateral parietal calvarium related to previous shunt catheters. Scalp soft tissues demonstrate no acute finding. Sinuses: Paranasal sinuses are clear.  No mastoid effusion. Orbits: Left gaze noted. Aspects equals 10. Review of the MIP images confirms the above findings CTA NECK FINDINGS Aortic arch: Visualized aortic arch of normal caliber with normal branch pattern. Minimal plaque about the origin of the great vessels without hemodynamically significant stenosis. Visualized subclavian arteries widely patent. Right carotid system: Right common and internal carotid arteries widely patent without stenosis, dissection, or occlusion. No  significant atheromatous narrowing about the right carotid bifurcation. Right ICA tortuous at the level of the skull base. Left carotid system: Left common carotid artery widely patent from its origin to the bifurcation without stenosis. No significant atheromatous narrowing about the left bifurcation. 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 A radiologic string sign is present. No frank intraluminal thrombus or raised dissection flap identified. Finding is age indeterminate. Distally, left ICA otherwise patent to the skull base without additional stenosis or other vascular abnormality. Vertebral arteries: Left vertebral artery dominant and arises from the left subclavian artery. 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 Right vertebral is diffusely hypoplastic, and is patent within the neck, but nearly occludes at the level of the skull base. Dominant left vertebral artery widely patent without stenosis, dissection, or occlusion. Skeleton: No acute osseous finding. No discrete lytic or blastic osseous lesions. Other neck: No other acute soft tissue abnormality within the neck. Upper chest: Visualized upper chest demonstrates no acute finding. Partially visualized lungs are grossly clear. Review of the MIP images confirms the above findings CTA HEAD FINDINGS Anterior circulation: Petrous, cavernous, and supraclinoid segments widely patent without hemodynamically significant stenosis. ICA termini well perfused. A1 segments widely patent. Normal anterior communicating artery complex. Anterior cerebral arteries patent to their distal aspects without stenosis. Left M1 widely patent. Normal left MCA bifurcation. Left MCA branches  well perfused to their distal aspects. Right MCA hypoplastic and attenuated, consistent with right MCA territory infarct. Negative right MCA bifurcation. No right-sided large vessel occlusion. Posterior circulation: Focal non stenotic plaque noted within the left V4 segment beyond the takeoff of the left PICA. Left V4 segment otherwise widely patent to the vertebrobasilar junction. Left PICA patent. Right vertebral markedly hypoplastic as it courses into the cranial vault, contributing little to the posterior circulation. Right PICA patent. Basilar  patent to its distal aspect without stenosis. Superior cerebral arteries patent bilaterally. Both of the posterior cerebral arteries primarily supplied via the basilar and are well perfused to their distal aspects. Venous sinuses: Grossly patent allowing for timing the contrast bolus. Anatomic variants: Dominant left vertebral artery with diffusely hypoplastic right vertebral artery. No intracranial aneurysm. Review of the MIP images confirms the above findings IMPRESSION: CT HEAD IMPRESSION: 1. No acute intracranial abnormality. 2. Aspects equals 10. 3. Chronic encephalomalacia throughout the right MCA territory, consistent with chronic right MCA territory infarct. 4. Left frontal approach VP shunt catheter in place with tip terminating in the left lateral ventricle. Stable ventricular size and morphology without hydrocephalus. CTA HEAD AND NECK IMPRESSION: 1. Negative CTA for emergent large vessel occlusion. 2. 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 No frank intraluminal thrombus or raised dissection flap identified. 3. No other hemodynamically significant or correctable stenosis identified about the major arterial vasculature of the head and neck. 4. Diffusely hypoplastic and attenuated right MCA, consistent with chronic right MCA territory infarct.  Critical Value/emergent results were called by telephone at the time of interpretation on 10/01/2018 at 7:15 pm to Dr. Milon DikesASHISH ARORA , who verbally acknowledged these results. Electronically Signed   By: Rise MuBenjamin  McClintock M.D.   On: 10/01/2018 19:58   Vas [place] Lower Extremity Venous (dvt)  Result Date: 10/02/2018  Lower Venous Study Indications: Swelling.  Limitations: Body habitus, poor ultrasound/tissue interface and poor patient compliance. Performing Technologist: Gertie FeyMichelle Simonetti MHA, RDMS, RVT, RDCS  Examination Guidelines: A complete evaluation includes B-mode imaging, spectral Doppler, color Doppler, and power Doppler as needed of all accessible portions of each vessel. Bilateral testing is considered an integral part of a complete examination. Limited examinations for reoccurring indications may be performed as noted.  +---------+---------------+---------+-----------+----------+--------------+  RIGHT     Compressibility Phasicity Spontaneity Properties Thrombus Aging  +---------+---------------+---------+-----------+----------+--------------+  CFV       Full            Yes       Yes                                    +---------+---------------+---------+-----------+----------+--------------+  SFJ       Full                                                             +---------+---------------+---------+-----------+----------+--------------+  FV Prox   Full                                                             +---------+---------------+---------+-----------+----------+--------------+  FV Mid    Full                                                             +---------+---------------+---------+-----------+----------+--------------+  FV Distal Full                                                             +---------+---------------+---------+-----------+----------+--------------+  POP       Full            Yes       Yes                                     +---------+---------------+---------+-----------+----------+--------------+ Limited visualization of the right posterior tibial and peroneal veins.  +---------+---------------+---------+-----------+----------+--------------+  LEFT      Compressibility Phasicity Spontaneity Properties Thrombus Aging  +---------+---------------+---------+-----------+----------+--------------+  CFV       Full                                                             +---------+---------------+---------+-----------+----------+--------------+  SFJ       Full                                                             +---------+---------------+---------+-----------+----------+--------------+  FV Prox   Full                                                             +---------+---------------+---------+-----------+----------+--------------+  FV Mid    Full                                                             +---------+---------------+---------+-----------+----------+--------------+  FV Distal Full                                                             +---------+---------------+---------+-----------+----------+--------------+  POP       Full                                                             +---------+---------------+---------+-----------+----------+--------------+ Limited visualization of the left posterior tibial and peroneal veins.    Summary: Right: There is no evidence of deep vein thrombosis in the lower extremity. However, portions of this examination were limited-  see technologist comments above. No cystic structure found in the popliteal fossa. Left: There is no evidence of deep vein thrombosis in the lower extremity. However, portions of this examination were limited- see technologist comments above. No cystic structure found in the popliteal fossa.  *See table(s) above for measurements and observations.    Preliminary         Scheduled Meds:   stroke: mapping our early stages of recovery book    Does not apply Once   aspirin  300 mg Rectal Daily   Or   aspirin  325 mg Oral Daily   Continuous Infusions:  sodium chloride 100 mL/hr at 10/02/18 0054   lacosamide (VIMPAT) IV 100 mg (10/03/18 1021)   levETIRAcetam 500 mg (10/03/18 0756)   valproate sodium 1,000 mg (10/02/18 2120)     LOS: 2 days    Time spent: 30 minutes    Dorcas Carrow, MD [facility] Pager [phone]  If 7PM-7AM, please contact night-coverage [email or web address] Password [email or web address] 10/03/2018, 11:08 AM

## 2018-10-04 ENCOUNTER — Inpatient Hospital Stay (HOSPITAL_COMMUNITY): Payer: Medicare Other

## 2018-10-04 LAB — CBC WITH DIFFERENTIAL/PLATELET
Abs Immature Granulocytes: 0.03 10*3/uL (ref 0.00–0.07)
Basophils Absolute: 0 10*3/uL (ref 0.0–0.1)
Basophils Relative: 0 %
Eosinophils Absolute: 0.1 10*3/uL (ref 0.0–0.5)
Eosinophils Relative: 2 %
HCT: 41.2 % (ref 39.0–52.0)
Hemoglobin: 13.9 g/dL (ref 13.0–17.0)
Immature Granulocytes: 1 %
Lymphocytes Relative: 36 %
Lymphs Abs: 1.1 10*3/uL (ref 0.7–4.0)
MCH: 29.8 pg (ref 26.0–34.0)
MCHC: 33.7 g/dL (ref 30.0–36.0)
MCV: 88.4 fL (ref 80.0–100.0)
Monocytes Absolute: 0.5 10*3/uL (ref 0.1–1.0)
Monocytes Relative: 17 %
Neutro Abs: 1.3 10*3/uL — ABNORMAL LOW (ref 1.7–7.7)
Neutrophils Relative %: 44 %
Platelets: 68 10*3/uL — ABNORMAL LOW (ref 150–400)
RBC: 4.66 MIL/uL (ref 4.22–5.81)
RDW: 14.6 % (ref 11.5–15.5)
WBC: 3 10*3/uL — ABNORMAL LOW (ref 4.0–10.5)
nRBC: 0 % (ref 0.0–0.2)

## 2018-10-04 LAB — RAPID URINE DRUG SCREEN, HOSP PERFORMED
Amphetamines: NOT DETECTED
Barbiturates: NOT DETECTED
Benzodiazepines: NOT DETECTED
Cocaine: NOT DETECTED
Opiates: NOT DETECTED
Tetrahydrocannabinol: NOT DETECTED

## 2018-10-04 LAB — BASIC METABOLIC PANEL
Anion gap: 9 (ref 5–15)
BUN: 7 mg/dL (ref 6–20)
CO2: 25 mmol/L (ref 22–32)
Calcium: 8.5 mg/dL — ABNORMAL LOW (ref 8.9–10.3)
Chloride: 108 mmol/L (ref 98–111)
Creatinine, Ser: 0.88 mg/dL (ref 0.61–1.24)
GFR calc Af Amer: >60 mL/min (ref >60)
GFR calc non Af Amer: >60 mL/min (ref >60)
Glucose, Bld: 89 mg/dL (ref 70–99)
Potassium: 4.1 mmol/L (ref 3.5–5.1)
Sodium: 142 mmol/L (ref 135–145)

## 2018-10-04 LAB — LUPUS ANTICOAGULANT PANEL
DRVVT: 32.2 s (ref 0.0–47.0)
PTT Lupus Anticoagulant: 34.8 s (ref 0.0–51.9)

## 2018-10-04 LAB — PROTEIN S ACTIVITY: Protein S Activity: 87 % (ref 63–140)

## 2018-10-04 LAB — PROTEIN S, TOTAL: Protein S Ag, Total: 65 % (ref 60–150)

## 2018-10-04 LAB — PHOSPHORUS: Phosphorus: 3.3 mg/dL (ref 2.5–4.6)

## 2018-10-04 LAB — MAGNESIUM: Magnesium: 1.7 mg/dL (ref 1.7–2.4)

## 2018-10-04 LAB — PROTEIN C ACTIVITY: Protein C Activity: 61 % — ABNORMAL LOW (ref 73–180)

## 2018-10-04 LAB — PROTEIN C, TOTAL: Protein C, Total: 60 % (ref 60–150)

## 2018-10-04 MED ORDER — LACOSAMIDE 200 MG PO TABS
200.0000 mg | ORAL_TABLET | Freq: Two times a day (BID) | ORAL | Status: DC
  Administered 2018-10-05: 200 mg via ORAL
  Filled 2018-10-04 (×2): qty 1

## 2018-10-04 MED ORDER — ATORVASTATIN CALCIUM 80 MG PO TABS
80.0000 mg | ORAL_TABLET | Freq: Every day | ORAL | Status: DC
  Filled 2018-10-04: qty 1

## 2018-10-04 MED ORDER — ORAL CARE MOUTH RINSE
15.0000 mL | Freq: Two times a day (BID) | OROMUCOSAL | Status: DC
  Administered 2018-10-04 – 2018-10-05 (×2): 15 mL via OROMUCOSAL

## 2018-10-04 MED ORDER — DIVALPROEX SODIUM 250 MG PO DR TAB
1000.0000 mg | DELAYED_RELEASE_TABLET | Freq: Two times a day (BID) | ORAL | Status: DC
  Administered 2018-10-05: 1000 mg via ORAL
  Filled 2018-10-04 (×2): qty 4

## 2018-10-04 NOTE — Progress Notes (Signed)
Pt refused all night time PO meds. Dr. Kennon Holter informed.

## 2018-10-04 NOTE — Evaluation (Signed)
Speech Language Pathology Evaluation Patient Details Name: Patrick RatelChristopher L Franklin MRN: [id number] DOB: 03-06-1970 Today's Date: 10/04/2018 Time: [id number] SLP Time Calculation (min) (ACUTE ONLY): 15 min  Problem List:  Patient Active Problem List   Diagnosis Date Noted   Cerebral embolism with cerebral infarction 10/03/2018   Neurological deficit present 10/01/2018   Thrombocytopenia (HCC) 10/01/2018   Neurogenic bladder 10/01/2018   Medicare annual wellness visit, subsequent 07/23/2018   Spastic hemiplegia affecting nondominant side (HCC) 03/26/2018   Impaired decision making 08/30/2017   Aorto-iliac atherosclerosis (HCC) 08/29/2017   History of renal stone 08/22/2017   Seizure disorder (HCC) 08/09/2017   Hemiparesis affecting left side as late effect of stroke (HCC) 08/09/2017   Essential hypertension 05/15/2017   Urinary retention 05/15/2017   Hyperlipidemia 05/15/2017   Hx of ischemic right MCA stroke 05/15/2017   MDD (major depressive disorder) 05/15/2017   Past Medical History:  Past Medical History:  Diagnosis Date   Asthma    Cardiomegaly 04/13/2016   Depression    DVT (deep venous thrombosis) (HCC)    GERD (gastroesophageal reflux disease)    Hemiplegia and hemiparesis    Left side   Hypertension    Neurogenic bladder    Neurogenic bowel    Neuropathy    Pulmonary embolism (HCC) 01/13/2016   Seizures (HCC)    Stroke (HCC) 12/31/2015   left side deficits    Subluxation of shoulder joint 04/13/2016   left   UTI (urinary tract infection)    Past Surgical History:  Past Surgical History:  Procedure Laterality Date   CRANIECTOMY  2017   CRANIOPLASTY  2018   IVC FILTER INSERTION  2017   PEG TUBE PLACEMENT  2017   VENTRICULOPERITONEAL SHUNT  2018   HPI:  Pt is a 48 y/o male who presented with episode of left gaze preference and garbled speech. PMH: large right MCA stroke (2017) with large right cerebral encephalomalacia,  hydrocephalus status post VP shunt, residual left-sided hemiplegia and left-sided hemianopsia, seizure disorder, neurogenic bladder, asthma, DVT, HTN, depression. Head CT revealed evolving hypodensity within the posterior left temporal occipital region  consistent with evolving left posterior MCA territory infarct.    Assessment / Plan / Recommendation Clinical Impression  [name] participation in the evaluation was limited despite mod-max encouragement from this SLP and various staff members. The impact of his willingness of participate on his performance is considered. Pt frequently closes his eyes throughout the evaluation in response to the SLP's questions. He spontaneously produced some phrases and sentences to indicate some of his wants/needs but did not participate in any structured verbal expression tasks. The limited utterances produced were fluent without evidence paraphasias. One produced sentence was, "I can't stop shaking, should I hold onto this arm?" but he also produced shorter phrases such as, "go back" while pointing to the bed with a backward motion. Receptively, he answered simple yes/no questions but did not follow any commands or answer more complex questions. No motor speech deficits were demonstrated during the evaluation. [name] cognitive-linguistic skills could not be reliably evaluated due to the pt's participation and possible difficulty with auditory comprehension. Skilled SLP services are clinically indicated at this time to further assess language and initiate treatment as clinically indicated.     SLP Assessment  SLP Recommendation/Assessment: Patient needs continued Speech Lanaguage Pathology Services SLP Visit Diagnosis: Aphasia (R47.01)    Follow Up Recommendations  Inpatient Rehab    Frequency and Duration min 2x/week  2 weeks  SLP Evaluation Cognition  Overall Cognitive Status: Difficult to assess(Due to participation and potential language deficits)        Comprehension  Auditory Comprehension Overall Auditory Comprehension: Impaired Yes/No Questions: Impaired Basic Biographical Questions: (3/5) Complex Questions: (Pt did not respond to any complex questions) Commands: Impaired One Step Basic Commands: (0/4) Two Step Basic Commands: Not tested Conversation: Simple Visual Recognition/Discrimination Discrimination: Not tested Reading Comprehension Reading Status: Not tested    Expression Expression Primary Mode of Expression: Verbal Verbal Expression Overall Verbal Expression: Other (comment)(Difficult to assess due to limited verbal output) Automatic Speech: (Pt refused to complete any sequences) Level of Generative/Spontaneous Verbalization: Sentence Repetition: (Pt refused to participate ) Naming: Not tested(Pt refused to participate ) Pragmatics: Impairment Impairments: Abnormal affect;Eye contact Interfering Components: Attention   Oral / Motor  Oral Motor/Sensory Function Overall Oral Motor/Sensory Function: Within functional limits Motor Speech Overall Motor Speech: Appears within functional limits for tasks assessed Respiration: Within functional limits Phonation: Normal Resonance: Within functional limits Articulation: Within functional limitis Intelligibility: Intelligible Motor Planning: Witnin functional limits Motor Speech Errors: Not applicable   Melanye Hiraldo I. Hardin Negus, [place], [place] Office number [phone] Pager [place] 10/04/2018, 2:20 PM

## 2018-10-04 NOTE — Progress Notes (Signed)
Pt transported off unit to CT. Delia Heady RN

## 2018-10-04 NOTE — Progress Notes (Addendum)
Inpatient Rehab Admissions Coordinator:   I spoke extensively with pt's sister, Almyra Free, regarding recommendations for CIR.  Per Almyra Free, pt was requiring assist for all ADLs and mobility at baseline.  Had previously received Inpatient Rehab at [facility] from late Dec 2017 through Feb 2018, then SNF level rehab from Feb 2018 through May 2018 and transitioned home with them last year.  Per Almyra Free, the family had been looking into ALF for pt in February of this year, however with COVID they decided to delay.  Ultimately, they would like for pt to transition to long term care following rehab.  She is open to CIR but would like to discuss with her husband prior to making a final decision. I will continue to follow for possible CIR admission pending their decision.    Addendum: Sister and husband agreeable to CIR.  I have a bed available today, note Dr. Sloan Leiter states possibly tomorrow.  Will follow.   Shann Medal, PT, DPT Admissions Coordinator [phone] 10/04/18  12:11 PM

## 2018-10-04 NOTE — Progress Notes (Signed)
Pt back to room from CT. Delia Heady RN

## 2018-10-04 NOTE — H&P (Signed)
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 Karel JarvisAquino as well as neurogenic bladder, asthma, hypertension.  Per chart review patient lives with sister and family.  He does have a personal care attendant.  [place] pre-COVID.  Per sister he would ambulate approximately 25 feet can ambulate to the bathroom needed some assistance for stand pivot to the chair.  Presented 10/01/2018 with garbled speech left side gaze.  Cranial CT scan negative for acute changes.  CTA head and neck left mid ICA short segment high-grade stenosis.  No large vessel occlusion.  COVID negative, valproic acid level 83, ammonia 24, platelets 70-82000, chemistries unremarkable.  CT perfusion scan showed no core infarct.  A follow-up cranial CT scan completed showing progressive posterior left MCA infarction.  Patient was not a candidate for any carotid intervention.  Lower extremity Dopplers negative.  Echocardiogram with ejection fraction of 60% no source of embolism.  EEG negative for seizure there was noted severe diffuse encephalopathy as well as cortical dysfunction in right hemisphere.  Neurology consulted currently maintained on low-dose aspirin.  Patient remains on Vimpat as well as valproate.  Patient initially had also been loaded with Keppra discontinued after EEG negative.  Patient currently remains n.p.o. with follow-up speech therapy and diet has since been upgraded to regular.   Therapy evaluations  completed and patient was admitted for a comprehensive rehab program  Review of Systems  Unable to perform ROS: Acuity of condition   Past Medical History:  Diagnosis Date   Asthma    Cardiomegaly 04/13/2016   Depression    DVT (deep venous thrombosis) (HCC)    GERD (gastroesophageal reflux disease)    Hemiplegia and hemiparesis    Left side   Hypertension    Neurogenic bladder    Neurogenic bowel    Neuropathy    Pulmonary embolism (HCC) 01/13/2016   Seizures (HCC)    Stroke (HCC) 12/31/2015   left side deficits    Subluxation of shoulder joint 04/13/2016   left   UTI (urinary tract infection)    Past Surgical History:  Procedure Laterality Date   CRANIECTOMY  2017   CRANIOPLASTY  2018   IVC FILTER INSERTION  2017   PEG TUBE PLACEMENT  2017   VENTRICULOPERITONEAL SHUNT  2018   Family History  Problem Relation Age of Onset   Stroke Maternal Uncle    Stroke Maternal Uncle    Social History:  reports that he has never smoked. He has never used smokeless tobacco. He reports previous alcohol use. He reports that he does not use drugs. Allergies:  Allergies  Allergen Reactions   Versed [Midazolam] Other (See Comments)    Drops blood pressure and heart rate   Medications Prior to Admission  Medication Sig Dispense Refill   aspirin EC 81 MG tablet Take 81 mg by mouth daily.     atorvastatin (LIPITOR) 80 MG tablet TAKE 1 TABLET BY MOUTH AT BEDTIME (Patient taking differently: Take 80 mg by mouth at bedtime. )  90 tablet 1   baclofen (LIORESAL) 20 MG tablet Take 1 tablet (20 mg total) by mouth 2 (two) times daily. 180 each 3   Cholecalciferol (VITAMIN D3) 5000 units CAPS Take 5,000 Units by mouth daily.     clonazePAM (KLONOPIN) 0.5 MG disintegrating tablet Administer 1 tablet as needed for seizure. (Patient taking differently: Take 0.5 mg by mouth once as needed for seizure. ) 10 tablet 5   dantrolene (DANTRIUM) 100 MG capsule Take 1 tablet  every night (Patient taking differently: Take 100 mg by mouth at bedtime. Take 1 tablet every night) 90 capsule 3   divalproex (DEPAKOTE ER) 500 MG 24 hr tablet Take 3 tabs twice a day (Patient taking differently: Take 1,000 mg by mouth 2 (two) times daily. ) 540 tablet 3   doxazosin (CARDURA) 1 MG tablet TAKE 1 TABLET BY MOUTH ONCE DAILY FOR URINARY RETENTION (Patient taking differently: Take 1 mg by mouth at bedtime. for urinary retention.) 90 tablet 1   DULoxetine (CYMBALTA) 60 MG capsule Take 1 capsule (60 mg total) by mouth daily. For depression. 90 capsule 3   ibuprofen (ADVIL,MOTRIN) 200 MG tablet Take 200 mg by mouth every 6 (six) hours as needed for headache.      lacosamide (VIMPAT) 200 MG TABS tablet Take 1 tablet (200 mg total) by mouth 2 (two) times daily. 180 tablet 3   Melatonin 3 MG TABS Take 3-6 mg by mouth at bedtime.      metoprolol tartrate (LOPRESSOR) 25 MG tablet TAKE 1 TABLET BY MOUTH TWICE A DAY (Patient taking differently: Take 25 mg by mouth 2 (two) times daily. ) 180 tablet 1   Multiple Vitamin (MULTIVITAMIN WITH MINERALS) TABS tablet Take 1 tablet by mouth daily.     ondansetron (ZOFRAN ODT) 8 MG disintegrating tablet Take 1 tablet (8 mg total) by mouth every 8 (eight) hours as needed. (Patient not taking: Reported on 10/01/2018) 6 tablet 0    Drug Regimen Review  Drug regimen was reviewed and remains appropriate with no significant issues identified  Home: Home Living Family/patient expects to be discharged to:: Private residence Living Arrangements: Other relatives Available Help at Discharge: Family, Available 24 hours/day, Personal care attendant Type of Home: House Additional Comments: family takes him on adventures to parks, out on a boat, to the mountains,. pt has a Environmental health practitioner. normally pre covid was [place] [facility] [place] x[place] per [place]  Lives With: Other (Comment)(sister)   Functional History: Prior Function Level of  Independence: Needs assistance Gait / Transfers Assistance Needed: 1 person (A) to stand pivot to chair. sister reports x2 (A) for ambulation more recently, no more than 25' to get to bathroom. ADL's / Homemaking Assistance Needed: max (A) for all  Functional Status:  Mobility: Bed Mobility Overal bed mobility: Needs Assistance Bed Mobility: Supine to Sit Rolling: Total assist, +2 for physical assistance Supine to sit: Total assist, +2 for physical assistance, +2 for safety/equipment, HOB elevated Sit to supine: Mod assist, HOB elevated General bed mobility comments: up in chair on arrival Transfers Overall transfer level: Needs assistance Equipment used: Hemi-walker Transfers: Sit to/from Stand Sit to Stand: Min assist, Mod assist, +2 physical assistance Stand pivot transfers: Mod assist, +2 physical assistance General transfer comment: with hemi walker, +2 for safety and support on R hemi side. Multimodal cueing needed for engagement Ambulation/Gait Ambulation/Gait assistance: Min assist, Mod assist, +2 safety/equipment Gait Distance (Feet): (2-3 ft X 3 trials) Assistive device: Hemi-walker Gait Pattern/deviations:  Step-through pattern, Decreased stance time - left, Decreased step length - right, Decreased step length - left, Decreased weight shift to left General Gait Details: L AFO and tennis shoes donned; pt agreeable to ambulate but only for very short distances and then requested to sit down; pt unable to state reason for needing to sit    ADL: ADL Overall ADL's : Needs assistance/impaired General ADL Comments: total (A) for all adls.   Cognition: Cognition Overall Cognitive Status: Difficult to assess Orientation Level: Oriented to person Cognition Arousal/Alertness: Awake/alert Behavior During Therapy: Flat affect, Impulsive Overall Cognitive Status: Difficult to assess Area of Impairment: Attention, Memory, Following commands, Safety/judgement, Awareness, Problem  solving Current Attention Level: Sustained Memory: Decreased recall of precautions Following Commands: Follows one step commands inconsistently Safety/Judgement: Decreased awareness of safety, Decreased awareness of deficits Awareness: Emergent Problem Solving: Difficulty sequencing, Requires verbal cues, Requires tactile cues General Comments: pt with fluctuating levels of engagement this date, at times not stating anything/other times talking about how "the wedding will be beautiful because the lord will bless it" Difficult to assess due to: Level of arousal  Physical Exam: Blood pressure (!) 146/94, pulse 71, temperature 98.4 F (36.9 C), temperature source Axillary, resp. rate 19, weight 88.3 kg, SpO2 97 %. Physical Exam  Constitutional:  Obese, sitting in bed with eyes closed  HENT:  Right Ear: External ear normal.  Left Ear: External ear normal.  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  Eyes:  Would allow me to test eyes  Neck: Normal range of motion. No thyromegaly present.  Cardiovascular: Normal rate and regular rhythm. Exam reveals no friction rub.  No murmur heard. Respiratory: Effort normal. No respiratory distress. He has no wheezes.  GI: Soft. He exhibits no distension. There is no abdominal tenderness.  Musculoskeletal:        General: No deformity or edema.  Neurological:  Pt sitting up in bed with eyes closed. Would occasionally mouth words or grunt in response to my verbal and tactile stimulation.  Seemed to move all 4 limbs but inconsistent and would not cooperate with MMT. Seemed to sense pain in all 4's.       Skin: Skin is warm.  Psychiatric:  Flat but easily agitated    Results for orders placed or performed during the hospital encounter of 10/01/18 (from the past 48 hour(s))  CBC with Differential/Platelet     Status: Abnormal   Collection Time: 10/04/18  6:39 AM  Result Value Ref Range   WBC 3.0 (L) 4.0 - 10.5 K/uL   RBC  4.66 4.22 - 5.81 MIL/uL   Hemoglobin 13.9 13.0 - 17.0 g/dL   HCT 40.941.2 81.139.0 - 91.452.0 %   MCV 88.4 80.0 - 100.0 fL   MCH 29.8 26.0 - 34.0 pg   MCHC 33.7 30.0 - 36.0 g/dL   RDW 78.214.6 95.611.5 - 21.315.5 %   Platelets 68 (L) 150 - 400 K/uL    Comment: REPEATED TO VERIFY Immature Platelet Fraction may be clinically indicated, consider ordering this additional test [id number] CONSISTENT WITH PREVIOUS RESULT    nRBC 0.0 0.0 - 0.2 %   Neutrophils Relative % 44 %   Neutro Abs 1.3 (L) 1.7 - 7.7 K/uL   Lymphocytes Relative 36 %   Lymphs Abs 1.1 0.7 - 4.0 K/uL   Monocytes Relative 17 %   Monocytes Absolute 0.5 0.1 - 1.0 K/uL   Eosinophils Relative 2 %   Eosinophils Absolute 0.1 0.0 -  0.5 K/uL   Basophils Relative 0 %   Basophils Absolute 0.0 0.0 - 0.1 K/uL   Immature Granulocytes 1 %   Abs Immature Granulocytes 0.03 0.00 - 0.07 K/uL    Comment: Performed at [facility], [address], [place], [place] [address]  Basic metabolic panel     Status: Abnormal   Collection Time: 10/04/18  6:39 AM  Result Value Ref Range   Sodium 142 135 - 145 mmol/L   Potassium 4.1 3.5 - 5.1 mmol/L   Chloride 108 98 - 111 mmol/L   CO2 25 22 - 32 mmol/L   Glucose, Bld 89 70 - 99 mg/dL   BUN 7 6 - 20 mg/dL   Creatinine, Ser 9.62 0.61 - 1.24 mg/dL   Calcium 8.5 (L) 8.9 - 10.3 mg/dL   GFR calc non Af Amer >60 >60 mL/min   GFR calc Af Amer >60 >60 mL/min   Anion gap 9 5 - 15    Comment: Performed at [facility], [address], [place], [place] [address]  Magnesium     Status: None   Collection Time: 10/04/18  6:39 AM  Result Value Ref Range   Magnesium 1.7 1.7 - 2.4 mg/dL    Comment: Performed at [facility], [address], [place], [place] [address]  Phosphorus     Status: None   Collection Time: 10/04/18  6:39 AM  Result Value Ref Range   Phosphorus 3.3 2.5 - 4.6 mg/dL    Comment: Performed at [facility], [address], [place], [place] [address]  Rapid urine drug screen (hospital  performed)     Status: None   Collection Time: 10/04/18  1:45 PM  Result Value Ref Range   Opiates NONE DETECTED NONE DETECTED   Cocaine NONE DETECTED NONE DETECTED   Benzodiazepines NONE DETECTED NONE DETECTED   Amphetamines NONE DETECTED NONE DETECTED   Tetrahydrocannabinol NONE DETECTED NONE DETECTED   Barbiturates NONE DETECTED NONE DETECTED    Comment: (NOTE) DRUG SCREEN FOR MEDICAL PURPOSES ONLY.  IF CONFIRMATION IS NEEDED FOR ANY PURPOSE, NOTIFY LAB WITHIN 5 DAYS. LOWEST DETECTABLE LIMITS FOR URINE DRUG SCREEN Drug Class                     Cutoff (ng/mL) Amphetamine and metabolites    1000 Barbiturate and metabolites    200 Benzodiazepine                 200 Tricyclics and metabolites     300 Opiates and metabolites        300 Cocaine and metabolites        300 THC                            50 Performed at [facility], [address] [place], [place] [address]    Ct Head Wo Contrast  Result Date: 10/04/2018 CLINICAL DATA:  Follow-up stroke EXAM: CT HEAD WITHOUT CONTRAST TECHNIQUE: Contiguous axial images were obtained from the base of the skull through the vertex without intravenous contrast. COMPARISON:  10/02/2018 FINDINGS: Brain: Changes consistent with an evolving left MCA infarct are noted involving the left temporal and parietooccipital regions. This shows better demarcation when compared with the prior exam consistent with the edematous changes. Mild mass-effect upon the lateral ventricle on the left is seen. Midline shift is noted from left to right although  likely compensatory to the significant encephalomalacia from prior right MCA infarct. No new focal infarct is seen. Ventricular peritoneal shunt is noted in place. No acute hemorrhage is noted. Vascular: No hyperdense vessel or unexpected calcification. Skull: Changes of a prior right craniotomy are noted. These are stable from the prior exam. No acute bony abnormality is noted. Sinuses/Orbits: No acute  finding. Other: None. IMPRESSION: Progressive decreased attenuation related to a posterior left MCA infarct as described. Changes consistent with large right MCA infarct and severe encephalomalacia. This is stable from the prior exam. No new focal abnormality is seen. Electronically Signed   By: Alcide Clever M.D.   On: 10/04/2018 07:19       Medical Problem List and Plan: 1.  Slurred speech left side gaze secondary to left MCA infarction Wernicke's area secondary to left mid ICA short segment high-grade stenosis. Patient has hx of  prior right MCA infarction as well as  VP shunt for hydrocephalus  -admit to inpatient rehab  -enclosure bed for safety 2.  Antithrombotics: -DVT/anticoagulation: SCDs.  Venous Dopplers negative  -antiplatelet therapy: Aspirin 81 mg daily 3. Pain Management: Tylenol as needed 4. Mood: Provide emotional support  -antipsychotic agents: N/A 5. Neuropsych: This patient is not capable of making decisions on his own behalf. 6. Skin/Wound Care: Routine skin checks 7. Fluids/Electrolytes/Nutrition: Routine in and outs with follow-up chemistries on admit.  8.  Seizure disorder.  Vimpat 100 mg twice daily, Depakote 1000 mg twice daily.  EEG negative 9.  Dysphagia.  Follow-up speech therapy.  Diet has been advanced to regular as of 10/04/2018. Will need supervision +/- encouragement potentially to eat. 10.  Hypertension.  Permissive hypertension.  Patient on metoprolol 25 mg twice daily prior to admission.  Resume as needed 11.  Hyperlipidemia.  Lipitor 80 mg daily 12.  Neurogenic bladder.  Check PVRs and voiding patterns.  13.  Chronic thrombocytopenia.  Stable    Charlton Amor, PA-C 10/05/2018

## 2018-10-04 NOTE — Care Management Important Message (Signed)
Important Message  Patient Details  Name: QUINTEZ MASELLI MRN: [id number] Date of Birth: [birth year]   Medicare Important Message Given:  Yes   Due to illness patient was not able to sign.  Unsigned copy left.   Hendrik Donath 10/04/2018, 3:56 PM

## 2018-10-04 NOTE — Progress Notes (Signed)
PROGRESS NOTE    Patrick Franklin  NFA:[id number] DOB: [birth year] DOA: 10/01/2018 PCP: Patrick Koch, NP    Brief Narrative:  Unfortunate 48 year old gentleman with history of large right MCA stroke in 2017 with large right cerebral encephalomalacia, hydrocephalus status post VP shunt, residual left-sided hemiplegia and left-sided hemianopsia, seizure disorder currently on Depakote and Vimpat, neurogenic bladder, asthma, DVT, GERD, hypertension and depression, poor mobility at baseline brought to the hospital with garbled speech and left-sided gauge.  Patient lives with his sister and brother-in-law, they were taking him for outing on the cart, he started looking on the left side as well as was talking something that did not make sense so they brought him to the ER. In the emergency room, patient was aphasic and encephalopathic.  Does have new left MCA stroke.  Assessment & Plan:   Principal Problem:   Neurological deficit present Active Problems:   Essential hypertension   Seizure disorder (HCC)   Thrombocytopenia (HCC)   Neurogenic bladder   Cerebral embolism with cerebral infarction  Acute left MCA stroke with aphasia, left gaze preference and encephalopathy in a patient with history of devastating right MCA stroke: Clinical findings, aphasia, dysarthria and left gaze preference.  Chronic left lower facial weakness.  Left arm weakness.  Left leg weakness.  Mostly improving. CT head findings, initial negative.  Subsequent CTA showed left MCA stroke.  Old right MCA stroke.  Repeat CT scan today stable with subacute stroke. MRI of the brain, unable to do because of VP shunt. CTA of the head and neck, no significant LVO. 2D echocardiogram, normal.  Antiplatelet therapy, on aspirin.  Continue. LDL 59, on Lipitor 80 at home.  Resume on discharge. Hemoglobin A1c,5.2 DVT prophylaxis, Lovenox  Acute metabolic encephalopathy, suspect seizure versus bilateral stroke: Patient already  on Vimpat and Depakote.  Keppra was started, discontinued by neurology. Spot EEG without any evidence of epileptiform focus. Most likely due to stroke. Does not have reliable intake, will change to oral when he has reliable intake.  Hypertension: Blood pressures stable.  Permissive hypertension.  Thrombocytopenia: Chronic thrombocytopenia and stable.  Abnormal behavior/agitation: Probably due to cortical inhibition.  Currently stabilizing.  No chemical restraint needed.  Will need fall precautions, delirium precautions.  Will have safety sitter to protect from physical harm.   DVT prophylaxis: Lovenox subcu Code Status: DNR Family Communication: Patient's sister Ms. Patrick Franklin. Disposition Plan: Acute inpatient rehab.  Anticipate tomorrow.   Consultants:   Neurology  Procedures:   EEG  Antimicrobials:   None   Subjective: Patient seen and examined.  No overnight events.  Early morning was agitated and trying to get out of the bed.  Started with [employer] cabin crew. He is intermittently talking with clear voice, today more awake than usual.  He did allow a speech therapist to evaluate him and ate without any problems.   Objective: Vitals:   10/04/18 0015 10/04/18 0415 10/04/18 0748 10/04/18 1139  BP: 135/80 125/68 (!) 147/93 137/88  Pulse: 73 72 71 95  Resp: 20 20 20 13   Temp:  98.6 F (37 C) 98.5 F (36.9 C) 98.3 F (36.8 C)  TempSrc:  Axillary Axillary Axillary  SpO2: 94% 96% 98% 96%  Weight:        Intake/Output Summary (Last 24 hours) at 10/04/2018 1300 Last data filed at 10/04/2018 0856 Gross per 24 hour  Intake 3273.74 ml  Output 1350 ml  Net 1923.74 ml   Filed Weights   10/01/18 1913  Weight: 88.3  kg    Examination:  General exam: Appears calm and comfortable, on room air.  Respiratory system: Clear to auscultation. Respiratory effort normal.  No added sounds. Cardiovascular system: S1 & S2 heard, RRR. No JVD, murmurs, rubs, gallops or clicks. No pedal  edema. Gastrointestinal system: Abdomen is nondistended, soft and nontender. No organomegaly or masses felt. Normal bowel sounds heard. Central nervous system: talks intermittently with clear voice. Chronic Right facial droop. Does not follow instructions but spontaneously moving right side when he wants. Stiffness and unable to move left side. Skin: No rashes, lesions or ulcers Psychiatry: Judgement and insight appear compromised.      Data Reviewed: I have personally reviewed following labs and imaging studies  CBC: Recent Labs  Lab 10/01/18 1850 10/02/18 0333 10/04/18 0639  WBC 4.3 4.3 3.0*  NEUTROABS 2.6  --  1.3*  HGB 15.8   15.3 14.5 13.9  HCT 47.9   45.0 45.0 41.2  MCV 90.2 91.3 88.4  PLT 82* 70* 68*   Basic Metabolic Panel: Recent Labs  Lab 10/01/18 1850 10/02/18 0333 10/04/18 0639  NA 143   143 147* 142  K 4.3   4.2 4.2 4.1  CL 109   107 111 108  CO2 28 27 25   GLUCOSE 112*   101* 91 89  BUN 15   17 11 7   CREATININE 1.00   0.90 0.77 0.88  CALCIUM 8.7* 8.7* 8.5*  MG  --   --  1.7  PHOS  --   --  3.3   GFR: Estimated Creatinine Clearance: 117.4 mL/min (by C-G formula based on SCr of 0.88 mg/dL). Liver Function Tests: Recent Labs  Lab 10/01/18 1850  AST 47*  ALT 48*  ALKPHOS 77  BILITOT 0.8  PROT 5.6*  ALBUMIN 3.3*   No results for input(s): LIPASE, AMYLASE in the last 168 hours. Recent Labs  Lab 10/02/18 0952  AMMONIA 24   Coagulation Profile: Recent Labs  Lab 10/01/18 1850  INR 1.0   Cardiac Enzymes: No results for input(s): CKTOTAL, CKMB, CKMBINDEX, TROPONINI in the last 168 hours. BNP (last 3 results) No results for input(s): PROBNP in the last 8760 hours. HbA1C: Recent Labs    10/02/18 0333  HGBA1C 5.2   CBG: Recent Labs  Lab 10/01/18 1846  GLUCAP 91   Lipid Profile: Recent Labs    10/02/18 0333  CHOL 117  HDL 32*  LDLCALC 59  TRIG 161129  CHOLHDL 3.7   Thyroid Function Tests: No results for input(s): TSH, T4TOTAL,  FREET4, T3FREE, THYROIDAB in the last 72 hours. Anemia Panel: No results for input(s): VITAMINB12, FOLATE, FERRITIN, TIBC, IRON, RETICCTPCT in the last 72 hours. Sepsis Labs: No results for input(s): PROCALCITON, LATICACIDVEN in the last 168 hours.  Recent Results (from the past 240 hour(s))  SARS Coronavirus 2 [facility](Hospital order, [facility]) Nasopharyngeal Nasopharyngeal Swab     Status: None   Collection Time: 10/01/18  7:34 PM   Specimen: Nasopharyngeal Swab  Result Value Ref Range Status   SARS Coronavirus 2 NEGATIVE NEGATIVE Final    Comment: (NOTE) If result is NEGATIVE SARS-CoV-2 target nucleic acids are NOT DETECTED. The SARS-CoV-2 RNA is generally detectable in upper and lower  respiratory specimens during the acute phase of infection. The lowest  concentration of SARS-CoV-2 viral copies this assay can detect is 250  copies / mL. A negative result does not preclude SARS-CoV-2 infection  and should not be used as the sole basis for  treatment or other  patient management decisions.  A negative result may occur with  improper specimen collection / handling, submission of specimen other  than nasopharyngeal swab, presence of viral mutation(s) within the  areas targeted by this assay, and inadequate number of viral copies  (<250 copies / mL). A negative result must be combined with clinical  observations, patient history, and epidemiological information. If result is POSITIVE SARS-CoV-2 target nucleic acids are DETECTED. The SARS-CoV-2 RNA is generally detectable in upper and lower  respiratory specimens dur ing the acute phase of infection.  Positive  results are indicative of active infection with SARS-CoV-2.  Clinical  correlation with patient history and other diagnostic information is  necessary to determine patient infection status.  Positive results do  not rule out bacterial infection or co-infection with other viruses. If result is PRESUMPTIVE  POSTIVE SARS-CoV-2 nucleic acids MAY BE PRESENT.   A presumptive positive result was obtained on the submitted specimen  and confirmed on repeat testing.  While 2019 novel coronavirus  (SARS-CoV-2) nucleic acids may be present in the submitted sample  additional confirmatory testing may be necessary for epidemiological  and / or clinical management purposes  to differentiate between  SARS-CoV-2 and other Sarbecovirus currently known to infect humans.  If clinically indicated additional testing with an alternate test  methodology [phone]) is advised. The SARS-CoV-2 RNA is generally  detectable in upper and lower respiratory sp ecimens during the acute  phase of infection. The expected result is Negative. Fact Sheet for Patients:  BoilerBrush.com.cy Fact Sheet for Healthcare Providers: https://pope.com/ This test is not yet approved or cleared by the [place] FDA and has been authorized for detection and/or diagnosis of SARS-CoV-2 by FDA under an Emergency Use Authorization (EUA).  This EUA will remain in effect (meaning this test can be used) for the duration of the COVID-19 declaration under Section 564(b)(1) of the Act, 21 U.S.C. section 360bbb-3(b)(1), unless the authorization is terminated or revoked sooner. Performed at [facility], [address] [place], [place] [address]          Radiology Studies: Ct Head Wo Contrast  Result Date: 10/04/2018 CLINICAL DATA:  Follow-up stroke EXAM: CT HEAD WITHOUT CONTRAST TECHNIQUE: Contiguous axial images were obtained from the base of the skull through the vertex without intravenous contrast. COMPARISON:  10/02/2018 FINDINGS: Brain: Changes consistent with an evolving left MCA infarct are noted involving the left temporal and parietooccipital regions. This shows better demarcation when compared with the prior exam consistent with the edematous changes. Mild mass-effect upon the  lateral ventricle on the left is seen. Midline shift is noted from left to right although likely compensatory to the significant encephalomalacia from prior right MCA infarct. No new focal infarct is seen. Ventricular peritoneal shunt is noted in place. No acute hemorrhage is noted. Vascular: No hyperdense vessel or unexpected calcification. Skull: Changes of a prior right craniotomy are noted. These are stable from the prior exam. No acute bony abnormality is noted. Sinuses/Orbits: No acute finding. Other: None. IMPRESSION: Progressive decreased attenuation related to a posterior left MCA infarct as described. Changes consistent with large right MCA infarct and severe encephalomalacia. This is stable from the prior exam. No new focal abnormality is seen. Electronically Signed   By: Alcide Clever M.D.   On: 10/04/2018 07:19   Vas [place] Lower Extremity Venous (dvt)  Result Date: 10/03/2018  Lower Venous Study Indications: Swelling.  Limitations: Body habitus, poor ultrasound/tissue interface and poor patient compliance.  Performing Technologist: Gertie FeyMichelle Simonetti MHA, RDMS, RVT, RDCS  Examination Guidelines: A complete evaluation includes B-mode imaging, spectral Doppler, color Doppler, and power Doppler as needed of all accessible portions of each vessel. Bilateral testing is considered an integral part of a complete examination. Limited examinations for reoccurring indications may be performed as noted.  +---------+---------------+---------+-----------+----------+--------------+  RIGHT     Compressibility Phasicity Spontaneity Properties Thrombus Aging  +---------+---------------+---------+-----------+----------+--------------+  CFV       Full            Yes       Yes                                    +---------+---------------+---------+-----------+----------+--------------+  SFJ       Full                                                              +---------+---------------+---------+-----------+----------+--------------+  FV Prox   Full                                                             +---------+---------------+---------+-----------+----------+--------------+  FV Mid    Full                                                             +---------+---------------+---------+-----------+----------+--------------+  FV Distal Full                                                             +---------+---------------+---------+-----------+----------+--------------+  POP       Full            Yes       Yes                                    +---------+---------------+---------+-----------+----------+--------------+ Limited visualization of the right posterior tibial and peroneal veins.  +---------+---------------+---------+-----------+----------+--------------+  LEFT      Compressibility Phasicity Spontaneity Properties Thrombus Aging  +---------+---------------+---------+-----------+----------+--------------+  CFV       Full                                                             +---------+---------------+---------+-----------+----------+--------------+  SFJ       Full                                                             +---------+---------------+---------+-----------+----------+--------------+  FV Prox   Full                                                             +---------+---------------+---------+-----------+----------+--------------+  FV Mid    Full                                                             +---------+---------------+---------+-----------+----------+--------------+  FV Distal Full                                                             +---------+---------------+---------+-----------+----------+--------------+  POP       Full                                                             +---------+---------------+---------+-----------+----------+--------------+ Limited visualization of the left posterior tibial  and peroneal veins.    Summary: Right: There is no evidence of deep vein thrombosis in the lower extremity. However, portions of this examination were limited- see technologist comments above. No cystic structure found in the popliteal fossa. Left: There is no evidence of deep vein thrombosis in the lower extremity. However, portions of this examination were limited- see technologist comments above. No cystic structure found in the popliteal fossa.  *See table(s) above for measurements and observations. Electronically signed by Gretta Began MD on 10/03/2018 at 4:53:34 PM.    Final         Scheduled Meds:  aspirin EC  81 mg Oral Daily   Or   aspirin  300 mg Rectal Daily   divalproex  1,000 mg Oral Q12H   mouth rinse  15 mL Mouth Rinse BID   Continuous Infusions:  sodium chloride 100 mL/hr at 10/04/18 0223   lacosamide (VIMPAT) IV 100 mg (10/04/18 1125)     LOS: 3 days    Time spent: 25 minutes    Dorcas Carrow, MD [facility] Pager [phone]  If 7PM-7AM, please contact night-coverage [email or web address] Password [email or web address] 10/04/2018, 1:00 PM

## 2018-10-04 NOTE — Progress Notes (Signed)
Occupational Therapy Treatment Patient Details Name: Patrick Franklin MRN: [id number] DOB: 10-16-1970 Today's Date: 10/04/2018    History of present illness Patient is a 48 y/o male who presents with episode of left gaze preference and garbled speech. 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 Head CT- evolving hypodensity in posterior left temporal occipital region and consistent with evolving left posterior MCA infarct. More likely concern for seizures. 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   OT comments  Pt progressing toward stated goals, focused session on mobility progression this date. Sister present, had brought AFO and shoes. Pt completed sit <> stand with min/mod A with hemi walker. With multimodal cueing, pt completing functional mobility just before the sink. Attempted to engage in BADL, pt not willing at this time with c/os of headache. At times through tx pt stating nonsensical things such as "the wedding will be beautiful because the Shaune Pollack will bless it". At other times he would close his eyes and not respond to VCing. D/c recs remain appropriate, will cont to follow acutely.    Follow Up Recommendations  CIR    Equipment Recommendations  None recommended by OT    Recommendations for Other Services      Precautions / Restrictions Precautions Precautions: Fall Precaution Comments: seizures, L sided weakness at baseline Restrictions Weight Bearing Restrictions: No       Mobility Bed Mobility               General bed mobility comments: up in chair on arrival  Transfers Overall transfer level: Needs assistance   Transfers: Sit to/from Stand;Stand Pivot Transfers Sit to Stand: Min assist;Mod assist;+2 physical assistance         General transfer comment: with  hemi walker, +2 for safety and support on R hemi side. Multimodal cueing needed for engagement    Balance Overall balance assessment: Needs assistance Sitting-balance support: Feet supported;Bilateral upper extremity supported;Single extremity supported Sitting balance-Leahy Scale: Fair Sitting balance - Comments: sitting in chair with close assist   Standing balance support: Single extremity supported Standing balance-Leahy Scale: Poor Standing balance comment: reliant on external support                           ADL either performed or assessed with clinical judgement   ADL Overall ADL's : Needs assistance/impaired                                       General ADL Comments: total (A) for all adls.      Vision Patient Visual Report: Other (comment);Peripheral vision impairment Additional Comments: baseline L eye peripheral vision loss with sepcialized lenses. Difficulty following commands to further assess vision   Perception     Praxis      Cognition Arousal/Alertness: Awake/alert Behavior During Therapy: Flat affect;Impulsive Overall Cognitive Status: Difficult to assess Area of Impairment: Attention;Memory;Following commands;Safety/judgement;Awareness;Problem solving                   Current Attention Level: Sustained Memory: Decreased recall of precautions Following Commands: Follows one step commands inconsistently Safety/Judgement: Decreased awareness of safety;Decreased awareness of deficits Awareness: Emergent Problem Solving: Difficulty sequencing;Requires verbal cues;Requires tactile cues General Comments: pt with fluctuating levels of engagement  this date, at times not stating anything/other times talking about how "the wedding will be beautiful because the lord will bless it"        Exercises     Shoulder Instructions       General Comments      Pertinent Vitals/ Pain       Pain Assessment: Faces Faces Pain  Scale: Hurts a little bit Pain Location: head Pain Descriptors / Indicators: Grimacing Pain Intervention(s): Monitored during session  Home Living     Available Help at Discharge: Family;Available 24 hours/day;Personal care attendant Type of Home: House                              Lives With: Other (Comment)(sister)    Prior Functioning/Environment              Frequency           Progress Toward Goals  OT Goals(current goals can now be found in the care plan section)  Progress towards OT goals: Progressing toward goals  Acute Rehab OT Goals Patient Stated Goal: none stated OT Goal Formulation: With patient Time For Goal Achievement: 10/16/18 Potential to Achieve Goals: Good  Plan      Co-evaluation    PT/OT/SLP Co-Evaluation/Treatment: Yes Reason for Co-Treatment: Complexity of the patient's impairments (multi-system involvement);Necessary to address cognition/behavior during functional activity;For patient/therapist safety;To address functional/ADL transfers PT goals addressed during session: Mobility/safety with mobility;Balance OT goals addressed during session: ADL's and self-care;Proper use of Adaptive equipment and DME;Strengthening/ROM      AM-PAC OT "6 Clicks" Daily Activity     Outcome Measure   Help from another person eating meals?: Total Help from another person taking care of personal grooming?: Total Help from another person toileting, which includes using toliet, bedpan, or urinal?: Total Help from another person bathing (including washing, rinsing, drying)?: Total Help from another person to put on and taking off regular upper body clothing?: Total Help from another person to put on and taking off regular lower body clothing?: Total 6 Click Score: 6    End of Session Equipment Utilized During Treatment: Gait belt;Other (comment)(hemi walker)  OT Visit Diagnosis: Unsteadiness on feet (R26.81);Muscle weakness (generalized)  (M62.81);Hemiplegia and hemiparesis Hemiplegia - Right/Left: Left Hemiplegia - dominant/non-dominant: Non-Dominant Hemiplegia - caused by: Other cerebrovascular disease   Activity Tolerance Patient tolerated treatment well   Patient Left in chair;with call bell/phone within reach;with chair alarm set;with nursing/sitter in room;with family/visitor present   Nurse Communication Mobility status        Time: 1610-9604 OT Time Calculation (min): 31 min  Charges: OT General Charges $OT Visit: 1 Visit OT Treatments $Self Care/Home Management : 8-22 mins  Dalphine Handing, MSOT, OTR/L Behavioral Health OT/ Acute Relief OT [facility] Office: [phone]  Dalphine Handing 10/04/2018, 5:07 PM

## 2018-10-04 NOTE — Progress Notes (Signed)
Physical Therapy Treatment Patient Details Name: Patrick Franklin MRN: [id number] DOB: [birth year] Today's Date: 10/04/2018    History of Present Illness Patient is a 48 y/o male who presents with episode of left gaze preference and garbled speech. 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 Head CT- evolving hypodensity in posterior left temporal occipital region and consistent with evolving left posterior MCA infarct. More likely concern for seizures. 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    PT Comments    Patient seen for mobility progression. Sister present and brought AFO and shoes. Pt requires min/mod A +2 for functional transfer/short distance gait training with use of hemi walker. Continue to progress as tolerated.    Follow Up Recommendations  CIR;Supervision for mobility/OOB     Equipment Recommendations  None recommended by PT    Recommendations for Other Services       Precautions / Restrictions Precautions Precautions: Fall Precaution Comments: seizures, L sided weakness at baseline Restrictions Weight Bearing Restrictions: No    Mobility  Bed Mobility               General bed mobility comments: up in chair on arrival  Transfers Overall transfer level: Needs assistance Equipment used: Hemi-walker Transfers: Sit to/from Stand Sit to Stand: Min assist;Mod assist;+2 physical assistance         General transfer comment: with hemi walker, +2 for safety and support on R hemi side. Multimodal cueing needed for engagement  Ambulation/Gait Ambulation/Gait assistance: Min assist;Mod assist;+2 safety/equipment Gait Distance (Feet): (2-3 ft X 3 trials) Assistive device: Hemi-walker Gait Pattern/deviations: Step-through pattern;Decreased stance time -  left;Decreased step length - right;Decreased step length - left;Decreased weight shift to left     General Gait Details: L AFO and tennis shoes donned; pt agreeable to ambulate but only for very short distances and then requested to sit down; pt unable to state reason for needing to sit   Stairs             Wheelchair Mobility    Modified Rankin (Stroke Patients Only) Modified Rankin (Stroke Patients Only) Pre-Morbid Rankin Score: Moderately severe disability Modified Rankin: Moderately severe disability     Balance Overall balance assessment: Needs assistance Sitting-balance support: Feet supported;Bilateral upper extremity supported;Single extremity supported Sitting balance-Leahy Scale: Fair Sitting balance - Comments: sitting in chair with close assist   Standing balance support: Single extremity supported Standing balance-Leahy Scale: Poor Standing balance comment: reliant on external support                            Cognition Arousal/Alertness: Awake/alert Behavior During Therapy: Flat affect;Impulsive Overall Cognitive Status: Difficult to assess Area of Impairment: Attention;Memory;Following commands;Safety/judgement;Awareness;Problem solving                   Current Attention Level: Sustained Memory: Decreased recall of precautions Following Commands: Follows one step commands inconsistently Safety/Judgement: Decreased awareness of safety;Decreased awareness of deficits Awareness: Emergent Problem Solving: Difficulty sequencing;Requires verbal cues;Requires tactile cues General Comments: pt with fluctuating levels of engagement this date, at times not stating anything/other times talking about how "the wedding will be beautiful because the lord will bless it"      Exercises      General Comments        Pertinent Vitals/Pain Pain Assessment: Faces Faces  Pain Scale: Hurts a little bit Pain Location: head Pain Descriptors /  Indicators: Grimacing Pain Intervention(s): Monitored during session    Home Living                      Prior Function            PT Goals (current goals can now be found in the care plan section) Acute Rehab PT Goals Patient Stated Goal: none stated Progress towards PT goals: Progressing toward goals    Frequency    Min 4X/week      PT Plan Current plan remains appropriate    Co-evaluation PT/OT/SLP Co-Evaluation/Treatment: Yes Reason for Co-Treatment: Complexity of the patient's impairments (multi-system involvement);Necessary to address cognition/behavior during functional activity;For patient/therapist safety;To address functional/ADL transfers PT goals addressed during session: Mobility/safety with mobility OT goals addressed during session: ADL's and self-care;Proper use of Adaptive equipment and DME;Strengthening/ROM      AM-PAC PT "6 Clicks" Mobility   Outcome Measure  Help needed turning from your back to your side while in a flat bed without using bedrails?: A Lot Help needed moving from lying on your back to sitting on the side of a flat bed without using bedrails?: A Lot Help needed moving to and from a bed to a chair (including a wheelchair)?: A Lot Help needed standing up from a chair using your arms (e.g., wheelchair or bedside chair)?: A Lot Help needed to walk in hospital room?: A Lot Help needed climbing 3-5 steps with a railing? : Total 6 Click Score: 11    End of Session Equipment Utilized During Treatment: Gait belt Activity Tolerance: Patient tolerated treatment well Patient left: in chair;with call bell/phone within reach;with chair alarm set;with family/visitor present;with nursing/sitter in room Nurse Communication: Mobility status;Other (comment)(use the stedy) PT Visit Diagnosis: Muscle weakness (generalized) (M62.81);Hemiplegia and hemiparesis Hemiplegia - Right/Left: Left Hemiplegia - dominant/non-dominant:  Non-dominant Hemiplegia - caused by: Cerebral infarction     Time: [id number] PT Time Calculation (min) (ACUTE ONLY): 28 min  Charges:  $Gait Training: 8-22 mins                     Erline LevineKellyn Jhonny Calixto, PTA Acute Rehabilitation Services Pager: [phone] Office: [phone]     Carolynne EdouardKellyn R Brandon Scarbrough 10/04/2018, 6:04 PM

## 2018-10-04 NOTE — Plan of Care (Signed)
°  Problem: Nutrition: °Goal: Adequate nutrition will be maintained °Outcome: Progressing °  °

## 2018-10-04 NOTE — Progress Notes (Signed)
Pt not following any safety measures. Pt is pulling off lines, trying to constantly get out of bed, pulling of condom cath off, throwing seizures pads on the the floor. RN tries to re-orient, but is unsuccessful. Mittens have been placed back on the pt, fall matts placed on the floor, seizure pads returned to bed . MD notified.

## 2018-10-04 NOTE — Evaluation (Signed)
Clinical/Bedside Swallow Evaluation Patient Details  Name: Patrick Franklin MRN: [id number] Date of Birth: 1970/04/03  Today's Date: 10/04/2018 Time: SLP Start Time (ACUTE ONLY): 41 SLP Stop Time (ACUTE ONLY): 1104 SLP Time Calculation (min) (ACUTE ONLY): 19 min  Past Medical History:  Past Medical History:  Diagnosis Date   Asthma    Cardiomegaly 04/13/2016   Depression    DVT (deep venous thrombosis) (HCC)    GERD (gastroesophageal reflux disease)    Hemiplegia and hemiparesis    Left side   Hypertension    Neurogenic bladder    Neurogenic bowel    Neuropathy    Pulmonary embolism ([place]) 01/13/2016   Seizures ([place])    Stroke ([place]) 12/31/2015   left side deficits    Subluxation of shoulder joint 04/13/2016   left   UTI (urinary tract infection)    Past Surgical History:  Past Surgical History:  Procedure Laterality Date   CRANIECTOMY  2017   CRANIOPLASTY  2018   IVC FILTER INSERTION  2017   PEG TUBE PLACEMENT  2017   VENTRICULOPERITONEAL SHUNT  2018   HPI:  48 y/o male who presents with episode of left gaze preference and garbled speech. 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 PMH: large right MCA stroke (2017) with large right cerebral encephalomalacia, hydrocephalus status post VP shunt, residual left-sided hemiplegia and left-sided hemianopsia, seizure disorder, neurogenic bladder, asthma, DVT, HTN, depression. Head CT =Evolving hypodensity within the posterior left temporal occipitalregion is now visible, consistent with evolving left posterior MCAterritory infarct.    Assessment / Plan / Recommendation Clinical Impression  Patient was alert and participated in clinical swallow evaluation. There was a sitter present during evaluation, no family present. A modified oral motor evaluation was completed due to patient's inability/or refusal to follow simple commands. Oral motor function observed during po trials to be functional for eating. Unable to  fully assess sensation. Pt tolerate po trials with no s/sx of dysphagia (ice chips, water, puree, cracker). His mastication and bolus control were good with no oral residue found. [name] swallow intiation was timely with no throat clearing, cough and clear vocal quality with all consistencies. Patient was able to feed himself and demonstrate awareness of basic aspiration precautions. Recommend Regular diet, thin liquids, medications given whole in puree with staff to supervise patient to ensure aspiraiton precautions.  SLP Visit Diagnosis: Dysphagia, unspecified (R13.10)    Aspiration Risk  Mild aspiration risk    Diet Recommendation Regular;Thin liquid   Liquid Administration via: Cup;Straw Medication Administration: Whole meds with puree Supervision: Patient able to self feed;Full supervision/cueing for compensatory strategies Compensations: Minimize environmental distractions;Slow rate;Small sips/bites Postural Changes: Seated upright at 90 degrees    Other  Recommendations Oral Care Recommendations: Oral care BID           Frequency and Duration min 1 x/week  1 week       Prognosis Prognosis for Safe Diet Advancement: Good      Swallow Study   General Date of Onset: 10/01/18 HPI: 48 y/o male who presents with episode of left gaze preference and garbled speech. 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 PMH: large right MCA stroke (2017) with large right cerebral encephalomalacia, hydrocephalus status post VP shunt, residual left-sided hemiplegia and left-sided hemianopsia, seizure disorder, neurogenic bladder, asthma, DVT, HTN, depression. Head CT =Evolving hypodensity within the posterior left temporal occipitalregion is now visible, consistent with evolving left posterior MCAterritory infarct.  Type of Study: Bedside Swallow Evaluation Previous Swallow  Assessment: none found Diet Prior to this Study: NPO Temperature Spikes Noted: No Respiratory Status: Room air History of Recent  Intubation: No Behavior/Cognition: Alert;Cooperative;Distractible;Requires cueing Oral Cavity Assessment: Within Functional Limits Oral Care Completed by SLP: No Oral Cavity - Dentition: Adequate natural dentition Vision: Functional for self-feeding Self-Feeding Abilities: Able to feed self Patient Positioning: Upright in bed Baseline Vocal Quality: Normal Volitional Cough: Cognitively unable to elicit Volitional Swallow: Unable to elicit    Oral/Motor/Sensory Function Overall Oral Motor/Sensory Function: Within functional limits   Ice Chips Ice chips: Within functional limits Presentation: Spoon   Thin Liquid Thin Liquid: Within functional limits Presentation: Cup;Straw    Nectar Thick Nectar Thick Liquid: Not tested   Honey Thick Honey Thick Liquid: Not tested   Puree Puree: Within functional limits Presentation: Spoon   Solid     Solid: Within functional limits Presentation: Self Fed     Lindalou HoseSarah J. Ondra Deboard, MA, CCC-SLP 10/04/2018 11:09 AM

## 2018-10-04 NOTE — Plan of Care (Signed)
Patient will tolerate regular diet with thin liquids with no s/sx of aspiration.

## 2018-10-04 NOTE — Progress Notes (Signed)
STROKE TEAM PROGRESS NOTE   INTERVAL HISTORY Pt lying in bed, speech therapist at bedside. Pt still has receptive aphasia, intermittent spontaneous speech. PT/OT recommend CIR.    Vitals:   10/04/18 0015 10/04/18 0415 10/04/18 0748 10/04/18 1139  BP: 135/80 125/68 (!) 147/93 137/88  Pulse: 73 72 71 95  Resp: 20 20 20 13   Temp:  98.6 F (37 C) 98.5 F (36.9 C) 98.3 F (36.8 C)  TempSrc:  Axillary Axillary Axillary  SpO2: 94% 96% 98% 96%  Weight:        CBC:  Recent Labs  Lab 10/01/18 1850 10/02/18 0333 10/04/18 0639  WBC 4.3 4.3 3.0*  NEUTROABS 2.6  --  1.3*  HGB 15.8   15.3 14.5 13.9  HCT 47.9   45.0 45.0 41.2  MCV 90.2 91.3 88.4  PLT 82* 70* 68*    Basic Metabolic Panel:  Recent Labs  Lab 10/02/18 0333 10/04/18 0639  NA 147* 142  K 4.2 4.1  CL 111 108  CO2 27 25  GLUCOSE 91 89  BUN 11 7  CREATININE 0.77 0.88  CALCIUM 8.7* 8.5*  MG  --  1.7  PHOS  --  3.3   Lipid Panel:     Component Value Date/Time   CHOL 117 10/02/2018 0333   TRIG 129 10/02/2018 0333   HDL 32 (L) 10/02/2018 0333   CHOLHDL 3.7 10/02/2018 0333   VLDL 26 10/02/2018 0333   LDLCALC 59 10/02/2018 0333   HgbA1c:  Lab Results  Component Value Date   HGBA1C 5.2 10/02/2018   Urine Drug Screen:     Component Value Date/Time   LABOPIA NONE DETECTED 10/04/2018 1345   COCAINSCRNUR NONE DETECTED 10/04/2018 1345   LABBENZ NONE DETECTED 10/04/2018 1345   AMPHETMU NONE DETECTED 10/04/2018 1345   THCU NONE DETECTED 10/04/2018 1345   LABBARB NONE DETECTED 10/04/2018 1345    Alcohol Level No results found for: ETH  IMAGING Ct Head Wo Contrast  Result Date: 10/04/2018 CLINICAL DATA:  Follow-up stroke EXAM: CT HEAD WITHOUT CONTRAST TECHNIQUE: Contiguous axial images were obtained from the base of the skull through the vertex without intravenous contrast. COMPARISON:  10/02/2018 FINDINGS: Brain: Changes consistent with an evolving left MCA infarct are noted involving the left temporal and  parietooccipital regions. This shows better demarcation when compared with the prior exam consistent with the edematous changes. Mild mass-effect upon the lateral ventricle on the left is seen. Midline shift is noted from left to right although likely compensatory to the significant encephalomalacia from prior right MCA infarct. No new focal infarct is seen. Ventricular peritoneal shunt is noted in place. No acute hemorrhage is noted. Vascular: No hyperdense vessel or unexpected calcification. Skull: Changes of a prior right craniotomy are noted. These are stable from the prior exam. No acute bony abnormality is noted. Sinuses/Orbits: No acute finding. Other: None. IMPRESSION: Progressive decreased attenuation related to a posterior left MCA infarct as described. Changes consistent with large right MCA infarct and severe encephalomalacia. This is stable from the prior exam. No new focal abnormality is seen. Electronically Signed   By: Alcide CleverMark  Lukens M.D.   On: 10/04/2018 07:19   [place] Lower Extremity Venous (dvt)  Result Date: 10/03/2018  Lower Venous Study Indications: Swelling.  Limitations: Body habitus, poor ultrasound/tissue interface and poor patient compliance. Performing Technologist: Gertie FeyMichelle Simonetti MHA, RDMS, RVT, RDCS  Examination Guidelines: A complete evaluation includes B-mode imaging, spectral Doppler, color Doppler, and power Doppler as needed of all accessible  portions of each vessel. Bilateral testing is considered an integral part of a complete examination. Limited examinations for reoccurring indications may be performed as noted.  +---------+---------------+---------+-----------+----------+--------------+  RIGHT     Compressibility Phasicity Spontaneity Properties Thrombus Aging  +---------+---------------+---------+-----------+----------+--------------+  CFV       Full            Yes       Yes                                     +---------+---------------+---------+-----------+----------+--------------+  SFJ       Full                                                             +---------+---------------+---------+-----------+----------+--------------+  FV Prox   Full                                                             +---------+---------------+---------+-----------+----------+--------------+  FV Mid    Full                                                             +---------+---------------+---------+-----------+----------+--------------+  FV Distal Full                                                             +---------+---------------+---------+-----------+----------+--------------+  POP       Full            Yes       Yes                                    +---------+---------------+---------+-----------+----------+--------------+ Limited visualization of the right posterior tibial and peroneal veins.  +---------+---------------+---------+-----------+----------+--------------+  LEFT      Compressibility Phasicity Spontaneity Properties Thrombus Aging  +---------+---------------+---------+-----------+----------+--------------+  CFV       Full                                                             +---------+---------------+---------+-----------+----------+--------------+  SFJ       Full                                                             +---------+---------------+---------+-----------+----------+--------------+  FV Prox   Full                                                             +---------+---------------+---------+-----------+----------+--------------+  FV Mid    Full                                                             +---------+---------------+---------+-----------+----------+--------------+  FV Distal Full                                                             +---------+---------------+---------+-----------+----------+--------------+  POP       Full                                                              +---------+---------------+---------+-----------+----------+--------------+ Limited visualization of the left posterior tibial and peroneal veins.    Summary: Right: There is no evidence of deep vein thrombosis in the lower extremity. However, portions of this examination were limited- see technologist comments above. No cystic structure found in the popliteal fossa. Left: There is no evidence of deep vein thrombosis in the lower extremity. However, portions of this examination were limited- see technologist comments above. No cystic structure found in the popliteal fossa.  *See table(s) above for measurements and observations. Electronically signed by Curt Jews MD on 10/03/2018 at 4:53:34 PM.    Final     PHYSICAL EXAM   Temp:  [98.2 F (36.8 C)-98.6 F (37 C)] 98.3 F (36.8 C) (09/10 1139) Pulse Rate:  [71-104] 95 (09/10 1139) Resp:  [12-20] 13 (09/10 1139) BP: (125-147)/(68-106) 137/88 (09/10 1139) SpO2:  [94 %-98 %] 96 % (09/10 1139)  General - Well nourished, well developed, in no apparent distress.  Ophthalmologic - fundi not visualized due to noncooperation.  Cardiovascular - Regular rhythm and rate  Neuro - awake alert, good eye contact, able to track. Receptive aphasia, only able to answer questions for his name, not following other verbal commands. Not name or repeat as requested. However, he has spontaneous speech and able to make sentence without word salad. Right hemianopia and right neglect. No gaze preference, EOMI, PERRL. Left facial droop. Not cooperative on tongue protrusion. Left UE 0/5 with increased tone, LLE 0/5 but triple reflex on babinski test, increased tone. Spontaneous movement on the RUE and RLE. Sensation, coordination and gait not tested.   ASSESSMENT/PLAN Patrick Franklin is a 48 y.o. male with history of large right MCA stroke status post craniectomy w/ residual left hemiplegia s/p VP shunt and history of seizures presenting with  sudden onset of garbled speech with L gaze deviation.   Stroke:  L MCA wernicke's area moderate infarct secondary to left mid ICA  short segment high grade stenosis. However, embolic source is still in DDx given his extensive right MCA infarct hx   Code Stroke CT head No acute abnormality. Chronic encephalomalacia R MCA territory. L frontal lobe VP shunt in L lateral ventricle. No hydro. ASPECTS 10.     CTA head & neck no ELVO. Mid L ICA high-grade stenosis/near-occlusion/dissection. Hypoplastic R MCA.   CT head repeat evolving hypodensity posterior L temporal occipital region c/w L MCA infarct   MRI not able to perform due to VPS may not compatible with MRI  CT head repeat 9/10 posterior L MCA infarct. Large R MCA infarct and severe encephalomalacia. No focal abnormality.  Would not pursue further embolic work up after GOC discussion with sister  LE Doppler  Neg DVT  2D Echo EF 55-60%. No source of embolus   LDL 59  HgbA1c 5.2  Hypercoagulable work-up neg thus far except protein C activity was low at 61 (not too low, needs repeat in 3 months)  SCDs for VTE prophylaxis  aspirin 81 mg daily prior to admission, now on aspirin 300 PR daily. Given thrombocytopenia, recommend ASA 81mg  daily. Continue ASA 81mg  on discharge.   Therapy recommendations:  CIR. 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   Cause of stroke unknown  on aspirin 81  Hx seizures  Home meds: vimpat 100 bid, depakote 1000 bid  Last  seizure June 2020  [facility]  [facility]  Now on vimpat, keppra and depacon  Keppra discontinued given no seizure this time and with emotional swing  Hypertension  Home meds:  Metoprolol 25 bid  Stable  Permissive hypertension (OK if < 220/120) but gradually normalize in 3-5 days  Long-term BP goal 130-150 given left ICA high-grade stenosis  Hyperlipidemia  Home meds:  lipitor 80  Resume in hospital once able to swallow    LDL 59, goal < 70  Continue statin at discharge  Dysphagia  Secondary to stroke  NPO  Speech on board  Patient and family wish no PEG    Other Stroke Risk Factors  Hx ETOH use, none now  Family hx stroke (maternal uncle)  Cardiomyopathy   Hx DVT, PE  Other Active Problems  GERD  Thrombocytopenia platelet 82->70->68   [facility]   Hospital day # 3  Neurology will sign off. Please call with questions. Pt will follow up with Dr Karel JarvisAquino on 11/07/18 as scheduled. Thanks for the consult.   Marvel PlanJindong Tyrae Alcoser, MD PhD Stroke Neurology 10/04/2018 3:29 PM   To contact Stroke Continuity provider, please refer to [email or web address]. After hours, contact General Neurology

## 2018-10-05 ENCOUNTER — Encounter (HOSPITAL_COMMUNITY): Payer: Self-pay | Admitting: Nurse Practitioner

## 2018-10-05 ENCOUNTER — Inpatient Hospital Stay (HOSPITAL_COMMUNITY)
Admission: RE | Admit: 2018-10-05 | Discharge: 2018-11-05 | DRG: 057 | Disposition: A | Discharge status: Skilled Nursing Facility | Payer: Medicare Other | Source: Intra-hospital | Attending: Physical Medicine & Rehabilitation | Admitting: Physical Medicine & Rehabilitation

## 2018-10-05 ENCOUNTER — Other Ambulatory Visit: Payer: Self-pay

## 2018-10-05 DIAGNOSIS — Z86718 Personal history of other venous thrombosis and embolism: Secondary | ICD-10-CM | POA: Diagnosis not present

## 2018-10-05 DIAGNOSIS — F39 Unspecified mood [affective] disorder: Secondary | ICD-10-CM | POA: Diagnosis not present

## 2018-10-05 DIAGNOSIS — R45851 Suicidal ideations: Secondary | ICD-10-CM | POA: Diagnosis not present

## 2018-10-05 DIAGNOSIS — R7989 Other specified abnormal findings of blood chemistry: Secondary | ICD-10-CM

## 2018-10-05 DIAGNOSIS — Z7982 Long term (current) use of aspirin: Secondary | ICD-10-CM | POA: Diagnosis not present

## 2018-10-05 DIAGNOSIS — G8110 Spastic hemiplegia affecting unspecified side: Secondary | ICD-10-CM

## 2018-10-05 DIAGNOSIS — I69391 Dysphagia following cerebral infarction: Secondary | ICD-10-CM | POA: Diagnosis not present

## 2018-10-05 DIAGNOSIS — K219 Gastro-esophageal reflux disease without esophagitis: Secondary | ICD-10-CM | POA: Diagnosis present

## 2018-10-05 DIAGNOSIS — Z79899 Other long term (current) drug therapy: Secondary | ICD-10-CM

## 2018-10-05 DIAGNOSIS — R030 Elevated blood-pressure reading, without diagnosis of hypertension: Secondary | ICD-10-CM

## 2018-10-05 DIAGNOSIS — I69398 Other sequelae of cerebral infarction: Secondary | ICD-10-CM | POA: Diagnosis not present

## 2018-10-05 DIAGNOSIS — R1310 Dysphagia, unspecified: Secondary | ICD-10-CM | POA: Diagnosis present

## 2018-10-05 DIAGNOSIS — K592 Neurogenic bowel, not elsewhere classified: Secondary | ICD-10-CM | POA: Diagnosis present

## 2018-10-05 DIAGNOSIS — Z818 Family history of other mental and behavioral disorders: Secondary | ICD-10-CM | POA: Diagnosis not present

## 2018-10-05 DIAGNOSIS — F3342 Major depressive disorder, recurrent, in full remission: Secondary | ICD-10-CM | POA: Diagnosis not present

## 2018-10-05 DIAGNOSIS — F0630 Mood disorder due to known physiological condition, unspecified: Secondary | ICD-10-CM

## 2018-10-05 DIAGNOSIS — Z823 Family history of stroke: Secondary | ICD-10-CM | POA: Diagnosis not present

## 2018-10-05 DIAGNOSIS — R569 Unspecified convulsions: Secondary | ICD-10-CM

## 2018-10-05 DIAGNOSIS — I69354 Hemiplegia and hemiparesis following cerebral infarction affecting left non-dominant side: Principal | ICD-10-CM

## 2018-10-05 DIAGNOSIS — G441 Vascular headache, not elsewhere classified: Secondary | ICD-10-CM | POA: Diagnosis present

## 2018-10-05 DIAGNOSIS — E785 Hyperlipidemia, unspecified: Secondary | ICD-10-CM | POA: Diagnosis present

## 2018-10-05 DIAGNOSIS — N319 Neuromuscular dysfunction of bladder, unspecified: Secondary | ICD-10-CM | POA: Diagnosis present

## 2018-10-05 DIAGNOSIS — I10 Essential (primary) hypertension: Secondary | ICD-10-CM | POA: Diagnosis not present

## 2018-10-05 DIAGNOSIS — Z23 Encounter for immunization: Secondary | ICD-10-CM | POA: Diagnosis not present

## 2018-10-05 DIAGNOSIS — Z86711 Personal history of pulmonary embolism: Secondary | ICD-10-CM | POA: Diagnosis not present

## 2018-10-05 DIAGNOSIS — R339 Retention of urine, unspecified: Secondary | ICD-10-CM | POA: Diagnosis present

## 2018-10-05 DIAGNOSIS — J45909 Unspecified asthma, uncomplicated: Secondary | ICD-10-CM | POA: Diagnosis present

## 2018-10-05 DIAGNOSIS — Z20828 Contact with and (suspected) exposure to other viral communicable diseases: Secondary | ICD-10-CM | POA: Diagnosis present

## 2018-10-05 DIAGNOSIS — D696 Thrombocytopenia, unspecified: Secondary | ICD-10-CM | POA: Diagnosis present

## 2018-10-05 DIAGNOSIS — Z982 Presence of cerebrospinal fluid drainage device: Secondary | ICD-10-CM | POA: Diagnosis not present

## 2018-10-05 DIAGNOSIS — G40909 Epilepsy, unspecified, not intractable, without status epilepticus: Secondary | ICD-10-CM

## 2018-10-05 DIAGNOSIS — F329 Major depressive disorder, single episode, unspecified: Secondary | ICD-10-CM | POA: Diagnosis present

## 2018-10-05 DIAGNOSIS — I63512 Cerebral infarction due to unspecified occlusion or stenosis of left middle cerebral artery: Secondary | ICD-10-CM | POA: Diagnosis present

## 2018-10-05 DIAGNOSIS — Z6827 Body mass index (BMI) 27.0-27.9, adult: Secondary | ICD-10-CM

## 2018-10-05 DIAGNOSIS — I69328 Other speech and language deficits following cerebral infarction: Secondary | ICD-10-CM

## 2018-10-05 DIAGNOSIS — E669 Obesity, unspecified: Secondary | ICD-10-CM | POA: Diagnosis present

## 2018-10-05 MED ORDER — ASPIRIN EC 81 MG PO TBEC
81.0000 mg | DELAYED_RELEASE_TABLET | Freq: Every day | ORAL | Status: DC
  Administered 2018-10-06 – 2018-11-05 (×31): 81 mg via ORAL
  Filled 2018-10-05 (×32): qty 1

## 2018-10-05 MED ORDER — ACETAMINOPHEN 325 MG PO TABS
650.0000 mg | ORAL_TABLET | ORAL | Status: DC | PRN
  Administered 2018-10-08 – 2018-11-03 (×23): 650 mg via ORAL
  Filled 2018-10-05 (×23): qty 2

## 2018-10-05 MED ORDER — SORBITOL 70 % SOLN
30.0000 mL | Freq: Every day | Status: DC | PRN
  Administered 2018-10-14 – 2018-11-01 (×2): 30 mL via ORAL
  Filled 2018-10-05 (×4): qty 30

## 2018-10-05 MED ORDER — ACETAMINOPHEN 650 MG RE SUPP: 650.0000 mg | RECTAL | Status: DC | PRN

## 2018-10-05 MED ORDER — PNEUMOCOCCAL VAC POLYVALENT 25 MCG/0.5ML IJ INJ
0.5000 mL | INJECTION | INTRAMUSCULAR | Status: AC
  Administered 2018-10-07: 0.5 mL via INTRAMUSCULAR
  Filled 2018-10-05: qty 0.5

## 2018-10-05 MED ORDER — INFLUENZA VAC SPLIT QUAD 0.5 ML IM SUSY
0.5000 mL | PREFILLED_SYRINGE | INTRAMUSCULAR | Status: AC
  Administered 2018-10-07: 0.5 mL via INTRAMUSCULAR
  Filled 2018-10-05 (×2): qty 0.5

## 2018-10-05 MED ORDER — ACETAMINOPHEN 160 MG/5ML PO SOLN: 650.0000 mg | ORAL | Status: DC | PRN

## 2018-10-05 MED ORDER — ATORVASTATIN CALCIUM 80 MG PO TABS
80.0000 mg | ORAL_TABLET | Freq: Every day | ORAL | Status: DC
  Administered 2018-10-05 – 2018-11-04 (×30): 80 mg via ORAL
  Filled 2018-10-05 (×31): qty 1

## 2018-10-05 MED ORDER — DIVALPROEX SODIUM 500 MG PO DR TAB
1000.0000 mg | DELAYED_RELEASE_TABLET | Freq: Two times a day (BID) | ORAL | Status: DC
  Administered 2018-10-05 – 2018-11-05 (×62): 1000 mg via ORAL
  Filled 2018-10-05 (×63): qty 2

## 2018-10-05 MED ORDER — LACOSAMIDE 50 MG PO TABS
200.0000 mg | ORAL_TABLET | Freq: Two times a day (BID) | ORAL | Status: DC
  Administered 2018-10-05 – 2018-10-06 (×2): 200 mg via ORAL
  Administered 2018-10-06: 100 mg via ORAL
  Administered 2018-10-06 – 2018-11-05 (×60): 200 mg via ORAL
  Filled 2018-10-05 (×63): qty 4

## 2018-10-05 NOTE — Discharge Summary (Signed)
Physician Discharge Summary  Patrick Franklin ZOX:[id number] DOB: [birth year] DOA: 10/01/2018  PCP: Doreene Nest, NP  Admit date: 10/01/2018 Discharge date: 10/05/2018  Admitted From: Home Discharge disposition: CIR   Code Status: DNR   Recommendations for Outpatient Follow-Up:   1. Follow-up with neurology as an outpatient. 2. Needs repeat protein C level in 3 months.  Discharge Diagnosis:   Principal Problem:   Neurological deficit present Active Problems:   Essential hypertension   Seizure disorder (HCC)   Thrombocytopenia (HCC)   Neurogenic bladder   Cerebral embolism with cerebral infarction    History of Present Illness / Brief narrative:  Unfortunate 48 year old gentleman with history of large right MCA stroke in 2017 with large right cerebral encephalomalacia, hydrocephalus status post VP shunt, residual left-sided hemiplegia and left-sided hemianopsia, seizure disorder currently on Depakote and Vimpat, neurogenic bladder, asthma, DVT, GERD, hypertension and depression, poor mobility at baseline brought to the hospital with garbled speech and left-sided gauge.  Patient lives with his sister and brother-in-law, they were taking him for outing on the cart, he started looking on the left side as well as was talking something that did not make sense so they brought him to the ER. In the emergency room, patient was aphasic and encephalopathic.  Does have new left MCA stroke.  Subjective:  Seen and examined this morning.  Unfortunately, young Caucasian male.  Alert, awake, talking irrelevant.  Not oriented to time, place and person.  Unable to follow commands.  Hospital Course:  Stroke:  L MCA wernicke's area moderate infarct secondary to left mid ICA short segment high grade stenosis. However, embolic source is still in DDx given his extensive right MCA infarct hx   Code Stroke: Clinical findings: aphasia, dysarthria and left gaze preference.  Chronic left lower facial  weakness.  Left arm weakness.  Left leg weakness.   CT head No acute abnormality. Chronic encephalomalacia R MCA territory. L frontal lobe VP shunt in L lateral ventricle. No hydro.  CTA head & neck no ELVO. Mid L ICA high-grade stenosis/near-occlusion/dissection. Hypoplastic R MCA.   CT head repeat evolving hypodensity posterior L temporal occipital region c/w L MCA infarct   MRI not able to perform due to VPS may not compatible with MRI  CT head repeat 9/10 posterior L MCA infarct. Large R MCA infarct and severe encephalomalacia. No focal abnormality.  Neurology consult appreciated.  Would not pursue further embolic work up after GOC discussion with sister  LE Doppler  Neg DVT  2D Echo EF 55-60%. No source of embolus   LDL 59  HgbA1c 5.2  Hypercoagulable work-up neg thus far except protein C activity was low at 61 (not too low, per neurology, needs repeat in 3 months)  aspirin 81 mg daily prior to admission. Given thrombocytopenia, continue ASA 81mg  on discharge.   Therapy recommendations:  CIR. 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   Cause of stroke unknown  on aspirin 81  Hx  seizures  Home meds: vimpat 100 bid, depakote 1000 bid  Last seizure June 2020  Followed with Dr. Karel Jarvis at [facility]  Spot EEG without any evidence of epileptiform focus.  Keppra was initially started which was later discontinued by neurology.  Keppra discontinued given no  seizure this time and with emotional swing  Hypertension  Home meds:  Metoprolol 25 bid, doxazosin 1 mg daily.  Stable blood pressure.  Long-term BP goal 130-150 given left ICA high-grade stenosis  Hyperlipidemia  Home meds:  lipitor 80  LDL 59, goal < 70  Continue statin at discharge  Dysphagia  Secondary to stroke  Speech on board  Patient and family wish no PEG    Other Stroke Risk Factors  Hx ETOH use, none now  Family hx stroke (maternal uncle)  Cardiomyopathy   Hx DVT, PE  Other Active Problems  GERD  Thrombocytopenia platelet 82->70->68   Follow up with Dr. Karel Jarvis at [facility]  Agitation   Okay to discharge to CIR today.  Discharge Exam:   Vitals:   10/04/18 2000 10/05/18 0007 10/05/18 0334 10/05/18 0752  BP: (!) 146/97 (!) 148/93 (!) 146/94 (!) 141/109  Pulse: 67 69 71 97  Resp: 19 19 19  (!) 24  Temp: 99 F (37.2 C) 99 F (37.2 C) 98.4 F (36.9 C) 97.7 F (36.5 C)  TempSrc: Axillary Axillary Axillary Axillary  SpO2: 98% 98% 97% 97%  Weight:        Body mass index is 27.93 kg/m.  General exam: Young Caucasian male.  Not in physical distress Skin: No rashes, lesions or ulcers. HEENT: Atraumatic, normocephalic, supple neck, no obvious bleeding Lungs: Clear to auscultation bilaterally CVS: Regular rate and rhythm, no murmur GI/Abd soft, nontender, nondistended, bowel sound present. CNS: Alert, awake, talking irrelevant.  Unable to follow command, not oriented.  Spontaneous movement of right side noted.  Chronic right facial droop. Psychiatry: Mood appropriate Extremities: No pedal edema, no calf tenderness  Discharge Instructions:  Wound care: None Discharge Instructions    Enclosure bed apparatus   Complete by: As directed    Increase activity slowly   Complete by: As directed      Follow-up Information    Van Clines, MD. Go on 11/07/2018.   Specialty: Neurology Why: as scheduled Contact information: [address] [place] [address] [phone]          Allergies as of 10/05/2018      Reactions   Versed [midazolam] Other (See Comments)   Drops blood pressure and heart rate      Medication List    STOP taking these medications   ondansetron 8 MG disintegrating tablet Commonly known as: Zofran ODT     TAKE these medications   aspirin EC 81 MG tablet Take 81 mg by mouth daily.   atorvastatin 80 MG tablet Commonly known as: LIPITOR TAKE 1 TABLET BY MOUTH AT BEDTIME What changed:   how much to take  how to take this  when to take this  additional instructions   baclofen 20 MG tablet Commonly known as: LIORESAL Take 1 tablet (20 mg total) by mouth 2 (two) times daily.   clonazePAM 0.5 MG disintegrating tablet Commonly known as: KLONOPIN Administer 1 tablet as needed for seizure. What changed:   how much to take  how to take this  when to take this  reasons to take this  additional instructions   dantrolene 100 MG capsule Commonly known as: DANTRIUM Take  1 tablet every night What changed:   how much to take  how to take this  when to take this   divalproex 500 MG 24 hr tablet Commonly known as: DEPAKOTE ER Take 3 tabs twice a day What changed:   how much to take  how to take this  when to take this  additional instructions   doxazosin 1 MG tablet Commonly known as: CARDURA TAKE 1 TABLET BY MOUTH ONCE DAILY FOR URINARY RETENTION What changed: See the new instructions.   DULoxetine 60 MG capsule Commonly known as: CYMBALTA Take 1 capsule (60 mg total) by mouth daily. For depression.   ibuprofen 200 MG tablet Commonly known as: ADVIL Take 200 mg by mouth every 6 (six) hours as needed for headache.   lacosamide 200 MG Tabs tablet Commonly known as: Vimpat Take 1 tablet (200 mg total) by mouth 2 (two) times daily.   Melatonin 3 MG Tabs Take 3-6 mg by mouth at bedtime.   metoprolol tartrate 25 MG tablet Commonly known as:  LOPRESSOR TAKE 1 TABLET BY MOUTH TWICE A DAY   multivitamin with minerals Tabs tablet Take 1 tablet by mouth daily.   Vitamin D3 125 MCG (5000 UT) Caps Take 5,000 Units by mouth daily.       Time coordinating discharge: 35 minutes  The results of significant diagnostics from this hospitalization (including imaging, microbiology, ancillary and laboratory) are listed below for reference.    Procedures and Diagnostic Studies:   Ct Angio Head W Or Wo Contrast  Result Date: 10/01/2018 CLINICAL DATA:  Code stroke.  Old. EXAM: CT ANGIOGRAPHY HEAD AND NECK TECHNIQUE: Multidetector CT imaging of the head and neck was performed using the standard protocol during bolus administration of intravenous contrast. Multiplanar CT image reconstructions and MIPs were obtained to evaluate the vascular anatomy. Carotid stenosis measurements (when applicable) are obtained utilizing NASCET criteria, using the distal internal carotid diameter as the denominator. CONTRAST:  75mL OMNIPAQUE IOHEXOL 350 MG/ML SOLN COMPARISON:  Prior CT from 06/23/2017. FINDINGS: CT HEAD FINDINGS Brain: Extensive encephalomalacia related to chronic right MCA territory infarcts seen throughout the right cerebral hemisphere, stable from previous. Postoperative changes from overlying right craniotomy. A left frontal approach VP shunt catheter in place with tip terminating near the septum pellucidum. Overall, ventricular size and morphology is relatively similar to previous without hydrocephalus. Ex vacuo dilatation of the right lateral ventricle related to the chronic right frontal encephalomalacia. Dystrophic calcification at the frontal horn of the right lateral ventricle again noted. No acute intracranial hemorrhage. No definite or convincing acute large vessel territory infarct. No mass lesion or extra-axial fluid collection. Chronic 12 mm left-to-right shift of the septum pellucidum related to the right cerebral encephalomalacia noted,  stable. Vascular: No hyperdense vessel. Mild calcified atherosclerosis at the skull base. Skull: Prior right hemi craniotomy. Left frontal burr hole with shunt catheter in place. Remote burr holes seen at the bilateral parietal calvarium related to previous shunt catheters. Scalp soft tissues demonstrate no acute finding. Sinuses: Paranasal sinuses are clear.  No mastoid effusion. Orbits: Left gaze noted. Aspects equals 10. Review of the MIP images confirms the above findings CTA NECK FINDINGS Aortic arch: Visualized aortic arch of normal caliber with normal branch pattern. Minimal plaque about the origin of the great vessels without hemodynamically significant stenosis. Visualized subclavian arteries widely patent. Right carotid system: Right common and internal carotid arteries widely patent without stenosis, dissection, or occlusion. No significant atheromatous narrowing about the right carotid bifurcation. Right  ICA tortuous at the level of the skull base. Left carotid system: Left common carotid artery widely patent from its origin to the bifurcation without stenosis. No significant atheromatous narrowing about the left bifurcation. 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 A radiologic string sign is present. No frank intraluminal thrombus or raised dissection flap identified. Finding is age indeterminate. Distally, left ICA otherwise patent to the skull base without additional stenosis or other vascular abnormality. Vertebral arteries: Left vertebral artery dominant and arises from the left subclavian artery. 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 Right vertebral is diffusely hypoplastic, and is patent within the  neck, but nearly occludes at the level of the skull base. Dominant left vertebral artery widely patent without stenosis, dissection, or occlusion. Skeleton: No acute osseous finding. No discrete lytic or blastic osseous lesions. Other neck: No other acute soft tissue abnormality within the neck. Upper chest: Visualized upper chest demonstrates no acute finding. Partially visualized lungs are grossly clear. Review of the MIP images confirms the above findings CTA HEAD FINDINGS Anterior circulation: Petrous, cavernous, and supraclinoid segments widely patent without hemodynamically significant stenosis. ICA termini well perfused. A1 segments widely patent. Normal anterior communicating artery complex. Anterior cerebral arteries patent to their distal aspects without stenosis. Left M1 widely patent. Normal left MCA bifurcation. Left MCA branches well perfused to their distal aspects. Right MCA hypoplastic and attenuated, consistent with right MCA territory infarct. Negative right MCA bifurcation. No right-sided large vessel occlusion. Posterior circulation: Focal non stenotic plaque noted within the left V4 segment beyond the takeoff of the left PICA. Left V4 segment otherwise widely patent to the vertebrobasilar junction. Left PICA patent. Right vertebral markedly hypoplastic as it courses into the cranial vault, contributing little to the posterior circulation. Right PICA patent. Basilar patent to its distal aspect without stenosis. Superior cerebral arteries patent bilaterally. Both of the posterior cerebral arteries primarily supplied via the basilar and are well perfused to their distal aspects. Venous sinuses: Grossly patent allowing for timing the contrast bolus. Anatomic variants: Dominant left vertebral artery with diffusely hypoplastic right vertebral artery. No intracranial aneurysm. Review of the MIP images confirms the above findings IMPRESSION: CT HEAD IMPRESSION: 1. No acute intracranial abnormality. 2.  Aspects equals 10. 3. Chronic encephalomalacia throughout the right MCA territory, consistent with chronic right MCA territory infarct. 4. Left frontal approach VP shunt catheter in place with tip terminating in the left lateral ventricle. Stable ventricular size and morphology without hydrocephalus. CTA HEAD AND NECK IMPRESSION: 1. Negative CTA for emergent large vessel occlusion. 2. 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 No frank intraluminal thrombus or raised dissection flap identified. 3. No other hemodynamically significant or correctable stenosis identified about the major arterial vasculature of the head and neck. 4. Diffusely hypoplastic and attenuated right MCA, consistent with chronic right MCA territory infarct. Critical Value/emergent results were called by telephone at the time of interpretation on 10/01/2018 at 7:15 pm to Dr. Milon Dikes , who verbally acknowledged these results. Electronically Signed   By: Rise Mu M.D.   On: 10/01/2018 19:58   Ct Head Wo Contrast  Result Date: 10/02/2018 CLINICAL DATA:  Follow-up examination for acute stroke.  EXAM: CT HEAD WITHOUT CONTRAST TECHNIQUE: Contiguous axial images were obtained from the base of the skull through the vertex without intravenous contrast. COMPARISON:  Prior CTs from 10/01/2018. FINDINGS: Brain: Now evident is a focal region of all vein cytotoxic edema involving the posterior left temporal occipital region, consistent with evolving posterior left MCA territory infarct. This is not visualized on previous code stroke CT or CT perfusion. No associated hemorrhage or mass effect. Remainder of the brain is stable in appearance with no other new finding. No other acute large vessel territory infarct. Large chronic right MCA territory infarct with associated encephalomalacia. Left frontal approach shunt catheter in stable  position. Stable ventricular size and morphology. No extra-axial fluid collection. Vascular: Residual contrast material seen within the intracranial circulation. Calcified atherosclerosis at the skull base. Skull: Prior right craniotomy.  No new finding. Sinuses/Orbits: Globes and orbital soft tissues demonstrate no acute finding. Previously seen left gaze no longer clearly visualized. Paranasal sinuses and mastoid air cells remain clear. Other: None. IMPRESSION: 1. Evolving hypodensity within the posterior left temporal occipital region is now visible, consistent with evolving left posterior MCA territory infarct. No associated hemorrhage or mass effect. 2. Otherwise stable head CT. No other new acute intracranial abnormality. Electronically Signed   By: Rise Mu M.D.   On: 10/02/2018 05:26   Ct Angio Neck W Or Wo Contrast  Result Date: 10/01/2018 CLINICAL DATA:  Code stroke.  Old. EXAM: CT ANGIOGRAPHY HEAD AND NECK TECHNIQUE: Multidetector CT imaging of the head and neck was performed using the standard protocol during bolus administration of intravenous contrast. Multiplanar CT image reconstructions and MIPs were obtained to evaluate the vascular anatomy. Carotid stenosis measurements (when applicable) are obtained utilizing NASCET criteria, using the distal internal carotid diameter as the denominator. CONTRAST:  75mL OMNIPAQUE IOHEXOL 350 MG/ML SOLN COMPARISON:  Prior CT from 06/23/2017. FINDINGS: CT HEAD FINDINGS Brain: Extensive encephalomalacia related to chronic right MCA territory infarcts seen throughout the right cerebral hemisphere, stable from previous. Postoperative changes from overlying right craniotomy. A left frontal approach VP shunt catheter in place with tip terminating near the septum pellucidum. Overall, ventricular size and morphology is relatively similar to previous without hydrocephalus. Ex vacuo dilatation of the right lateral ventricle related to the chronic right frontal  encephalomalacia. Dystrophic calcification at the frontal horn of the right lateral ventricle again noted. No acute intracranial hemorrhage. No definite or convincing acute large vessel territory infarct. No mass lesion or extra-axial fluid collection. Chronic 12 mm left-to-right shift of the septum pellucidum related to the right cerebral encephalomalacia noted, stable. Vascular: No hyperdense vessel. Mild calcified atherosclerosis at the skull base. Skull: Prior right hemi craniotomy. Left frontal burr hole with shunt catheter in place. Remote burr holes seen at the bilateral parietal calvarium related to previous shunt catheters. Scalp soft tissues demonstrate no acute finding. Sinuses: Paranasal sinuses are clear.  No mastoid effusion. Orbits: Left gaze noted. Aspects equals 10. Review of the MIP images confirms the above findings CTA NECK FINDINGS Aortic arch: Visualized aortic arch of normal caliber with normal branch pattern. Minimal plaque about the origin of the great vessels without hemodynamically significant stenosis. Visualized subclavian arteries widely patent. Right carotid system: Right common and internal carotid arteries widely patent without stenosis, dissection, or occlusion. No significant atheromatous narrowing about the right carotid bifurcation. Right ICA tortuous at the level of the skull base. Left carotid system: Left common carotid artery widely patent from its origin to the bifurcation without stenosis. No significant  atheromatous narrowing about the left bifurcation. 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 A radiologic string sign is present. No frank intraluminal thrombus or raised dissection flap identified. Finding is age indeterminate. Distally, left ICA otherwise patent to the skull base without additional stenosis or other vascular  abnormality. Vertebral arteries: Left vertebral artery dominant and arises from the left subclavian artery. 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 Right vertebral is diffusely hypoplastic, and is patent within the neck, but nearly occludes at the level of the skull base. Dominant left vertebral artery widely patent without stenosis, dissection, or occlusion. Skeleton: No acute osseous finding. No discrete lytic or blastic osseous lesions. Other neck: No other acute soft tissue abnormality within the neck. Upper chest: Visualized upper chest demonstrates no acute finding. Partially visualized lungs are grossly clear. Review of the MIP images confirms the above findings CTA HEAD FINDINGS Anterior circulation: Petrous, cavernous, and supraclinoid segments widely patent without hemodynamically significant stenosis. ICA termini well perfused. A1 segments widely patent. Normal anterior communicating artery complex. Anterior cerebral arteries patent to their distal aspects without stenosis. Left M1 widely patent. Normal left MCA bifurcation. Left MCA branches well perfused to their distal aspects. Right MCA hypoplastic and attenuated, consistent with right MCA territory infarct. Negative right MCA bifurcation. No right-sided large vessel occlusion. Posterior circulation: Focal non stenotic plaque noted within the left V4 segment beyond the takeoff of the left PICA. Left V4 segment otherwise widely patent to the vertebrobasilar junction. Left PICA patent. Right vertebral markedly hypoplastic as it courses into the cranial vault, contributing little to the posterior circulation. Right PICA patent. Basilar patent to its distal aspect without stenosis. Superior cerebral arteries patent bilaterally. Both of the posterior cerebral arteries primarily supplied via the basilar  and are well perfused to their distal aspects. Venous sinuses: Grossly patent allowing for timing the contrast bolus. Anatomic variants: Dominant left vertebral artery with diffusely hypoplastic right vertebral artery. No intracranial aneurysm. Review of the MIP images confirms the above findings IMPRESSION: CT HEAD IMPRESSION: 1. No acute intracranial abnormality. 2. Aspects equals 10. 3. Chronic encephalomalacia throughout the right MCA territory, consistent with chronic right MCA territory infarct. 4. Left frontal approach VP shunt catheter in place with tip terminating in the left lateral ventricle. Stable ventricular size and morphology without hydrocephalus. CTA HEAD AND NECK IMPRESSION: 1. Negative CTA for emergent large vessel occlusion. 2. 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 No frank intraluminal thrombus or raised dissection flap identified. 3. No other hemodynamically significant or correctable stenosis identified about the major arterial vasculature of the head and neck. 4. Diffusely hypoplastic and attenuated right MCA, consistent with chronic right MCA territory infarct. Critical Value/emergent results were called by telephone at the time of interpretation on 10/01/2018 at 7:15 pm to Dr. Milon Dikes , who verbally acknowledged these results. Electronically Signed   By: Rise Mu M.D.   On: 10/01/2018 19:58   Ct Cerebral Perfusion W Contrast  Result Date: 10/02/2018 CLINICAL DATA:  Initial evaluation for acute stroke, left-sided gaze with confusion. EXAM: CT PERFUSION BRAIN TECHNIQUE: Multiphase CT imaging of the brain was performed following IV bolus contrast injection. Subsequent parametric perfusion maps were  calculated using RAPID software. CONTRAST:  27mL OMNIPAQUE IOHEXOL 350 MG/ML SOLN COMPARISON:  Prior CTA and CT from 10/01/2018. FINDINGS: CT Brain Perfusion  Findings: CBF (<30%) Volume: 5mL Perfusion (Tmax>6.0s) volume: 24mL Mismatch Volume: -30mL ASPECTS on noncontrast CT Head: 10 at 7:58 p.m. on 10/01/2018. Infarct Core: There is apparent core infarct overlying the periphery of the right cerebral convexity, felt to be artifactual given the extensive encephalomalacia throughout the right cerebral hemisphere due to the chronic right MCA territory infarct. No visible core infarct within the left cerebral hemisphere. Infarction Location:Negative CT perfusion for acute core infarct within the left cerebral hemisphere. There is elevated cerebral blood volume and cerebral blood flow throughout the left cerebral hemisphere, likely reflecting asymmetric perfusion as compared to the relative lack of perfusion within the right cerebral hemisphere due to the extensive chronic encephalomalacia and attenuated right middle cerebral artery. Associated decreased T-max seen throughout this region as well. Changes related to underlying seizure could also have this appearance, although the diffuse nature of these findings throughout the left cerebral hemisphere with perhaps not be expected to be seen in the setting of a seizure focus. IMPRESSION: 1. Perfusion mismatch throughout the left cerebral hemisphere with elevated cerebral blood flow, cerebral blood volume, with reduced T-max. While this finding is almost certainly at least in part reflective of perfusion differences as compared to the contralateral right hemisphere that demonstrates extensive chronic right MCA encephalomalacia, possible superimposed changes related to seizure could also have this appearance. Correlation with EEG may be helpful for further evaluation. 2. No core infarct within the left cerebral hemisphere by CT perfusion. Given the technical limitations of this study, a short interval follow-up non-contrast head CT to evaluate for possible interval change may be helpful, and also provide reassurance that in fact  no evolving ischemic changes are present. Results were discussed by telephone at the time of interpretation on 10/02/2018 at approximately 2:04 am with Dr. Kerney Elbe. Electronically Signed   By: Jeannine Boga M.D.   On: 10/02/2018 02:33   Dg Chest Port 1 View  Result Date: 10/01/2018 CLINICAL DATA:  Altered mental status EXAM: PORTABLE CHEST 1 VIEW COMPARISON:  None. FINDINGS: Low lung volumes. Left base scarring or atelectasis. Right lung clear. Heart is normal size. No effusions or acute bony abnormality. IMPRESSION: Left base scarring or atelectasis.  Low lung volumes. Electronically Signed   By: Rolm Baptise M.D.   On: 10/01/2018 22:00   Ct Head Code Stroke Wo Contrast  Result Date: 10/01/2018 CLINICAL DATA:  Code stroke.  Old. EXAM: CT ANGIOGRAPHY HEAD AND NECK TECHNIQUE: Multidetector CT imaging of the head and neck was performed using the standard protocol during bolus administration of intravenous contrast. Multiplanar CT image reconstructions and MIPs were obtained to evaluate the vascular anatomy. Carotid stenosis measurements (when applicable) are obtained utilizing NASCET criteria, using the distal internal carotid diameter as the denominator. CONTRAST:  37mL OMNIPAQUE IOHEXOL 350 MG/ML SOLN COMPARISON:  Prior CT from 06/23/2017. FINDINGS: CT HEAD FINDINGS Brain: Extensive encephalomalacia related to chronic right MCA territory infarcts seen throughout the right cerebral hemisphere, stable from previous. Postoperative changes from overlying right craniotomy. A left frontal approach VP shunt catheter in place with tip terminating near the septum pellucidum. Overall, ventricular size and morphology is relatively similar to previous without hydrocephalus. Ex vacuo dilatation of the right lateral ventricle related to the chronic right frontal encephalomalacia. Dystrophic calcification at the frontal horn of the right lateral ventricle again noted. No acute intracranial hemorrhage. No  definite  or convincing acute large vessel territory infarct. No mass lesion or extra-axial fluid collection. Chronic 12 mm left-to-right shift of the septum pellucidum related to the right cerebral encephalomalacia noted, stable. Vascular: No hyperdense vessel. Mild calcified atherosclerosis at the skull base. Skull: Prior right hemi craniotomy. Left frontal burr hole with shunt catheter in place. Remote burr holes seen at the bilateral parietal calvarium related to previous shunt catheters. Scalp soft tissues demonstrate no acute finding. Sinuses: Paranasal sinuses are clear.  No mastoid effusion. Orbits: Left gaze noted. Aspects equals 10. Review of the MIP images confirms the above findings CTA NECK FINDINGS Aortic arch: Visualized aortic arch of normal caliber with normal branch pattern. Minimal plaque about the origin of the great vessels without hemodynamically significant stenosis. Visualized subclavian arteries widely patent. Right carotid system: Right common and internal carotid arteries widely patent without stenosis, dissection, or occlusion. No significant atheromatous narrowing about the right carotid bifurcation. Right ICA tortuous at the level of the skull base. Left carotid system: Left common carotid artery widely patent from its origin to the bifurcation without stenosis. No significant atheromatous narrowing about the left bifurcation. 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 A radiologic string sign is present. No frank intraluminal thrombus or raised dissection flap identified. Finding is age indeterminate. Distally, left ICA otherwise patent to the skull base without additional stenosis or other vascular abnormality. Vertebral arteries: Left vertebral artery dominant and arises from the left subclavian artery. 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 Right vertebral is diffusely hypoplastic, and is patent within the neck, but nearly occludes at the level of the skull base. Dominant left vertebral artery widely patent without stenosis, dissection, or occlusion. Skeleton: No acute osseous finding. No discrete lytic or blastic osseous lesions. Other neck: No other acute soft tissue abnormality within the neck. Upper chest: Visualized upper chest demonstrates no acute finding. Partially visualized lungs are grossly clear. Review of the MIP images confirms the above findings CTA HEAD FINDINGS Anterior circulation: Petrous, cavernous, and supraclinoid segments widely patent without hemodynamically significant stenosis. ICA termini well perfused. A1 segments widely patent. Normal anterior communicating artery complex. Anterior cerebral arteries patent to their distal aspects without stenosis. Left M1 widely patent. Normal left MCA bifurcation. Left MCA branches well perfused to their distal aspects. Right MCA hypoplastic and attenuated, consistent with right MCA territory infarct. Negative right MCA bifurcation. No right-sided large vessel occlusion. Posterior circulation: Focal non stenotic plaque noted within the left V4 segment beyond the takeoff of the left PICA. Left V4 segment otherwise widely patent to the vertebrobasilar junction. Left PICA patent. Right vertebral markedly hypoplastic as it courses into the cranial vault, contributing little to the posterior circulation. Right PICA patent. Basilar patent to its distal aspect without stenosis. Superior cerebral arteries patent bilaterally. Both of the posterior cerebral arteries primarily supplied via the basilar and are well perfused to their distal aspects. Venous sinuses: Grossly patent allowing for timing the contrast bolus. Anatomic variants: Dominant left  vertebral artery with diffusely hypoplastic right vertebral artery. No intracranial aneurysm. Review of the MIP images confirms the above findings IMPRESSION: CT HEAD IMPRESSION: 1. No acute intracranial abnormality. 2. Aspects equals 10. 3. Chronic encephalomalacia throughout the right MCA territory, consistent with  chronic right MCA territory infarct. 4. Left frontal approach VP shunt catheter in place with tip terminating in the left lateral ventricle. Stable ventricular size and morphology without hydrocephalus. CTA HEAD AND NECK IMPRESSION: 1. Negative CTA for emergent large vessel occlusion. 2. 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 No frank intraluminal thrombus or raised dissection flap identified. 3. No other hemodynamically significant or correctable stenosis identified about the major arterial vasculature of the head and neck. 4. Diffusely hypoplastic and attenuated right MCA, consistent with chronic right MCA territory infarct. Critical Value/emergent results were called by telephone at the time of interpretation on 10/01/2018 at 7:15 pm to Dr. Milon DikesASHISH ARORA , who verbally acknowledged these results. Electronically Signed   By: Rise MuBenjamin  McClintock M.D.   On: 10/01/2018 19:58   Vas [place] Lower Extremity Venous (dvt)  Result Date: 10/03/2018  Lower Venous Study Indications: Swelling.  Limitations: Body habitus, poor ultrasound/tissue interface and poor patient compliance. Performing Technologist: Gertie FeyMichelle Simonetti MHA, RDMS, RVT, RDCS  Examination Guidelines: A complete evaluation includes B-mode imaging, spectral Doppler, color Doppler, and power Doppler as needed of all accessible portions of each vessel. Bilateral testing is considered an integral part of a complete examination. Limited examinations for reoccurring indications may be performed as noted.   +---------+---------------+---------+-----------+----------+--------------+  RIGHT     Compressibility Phasicity Spontaneity Properties Thrombus Aging  +---------+---------------+---------+-----------+----------+--------------+  CFV       Full            Yes       Yes                                    +---------+---------------+---------+-----------+----------+--------------+  SFJ       Full                                                             +---------+---------------+---------+-----------+----------+--------------+  FV Prox   Full                                                             +---------+---------------+---------+-----------+----------+--------------+  FV Mid    Full                                                             +---------+---------------+---------+-----------+----------+--------------+  FV Distal Full                                                             +---------+---------------+---------+-----------+----------+--------------+  POP       Full            Yes  Yes                                    +---------+---------------+---------+-----------+----------+--------------+ Limited visualization of the right posterior tibial and peroneal veins.  +---------+---------------+---------+-----------+----------+--------------+  LEFT      Compressibility Phasicity Spontaneity Properties Thrombus Aging  +---------+---------------+---------+-----------+----------+--------------+  CFV       Full                                                             +---------+---------------+---------+-----------+----------+--------------+  SFJ       Full                                                             +---------+---------------+---------+-----------+----------+--------------+  FV Prox   Full                                                             +---------+---------------+---------+-----------+----------+--------------+  FV Mid    Full                                                              +---------+---------------+---------+-----------+----------+--------------+  FV Distal Full                                                             +---------+---------------+---------+-----------+----------+--------------+  POP       Full                                                             +---------+---------------+---------+-----------+----------+--------------+ Limited visualization of the left posterior tibial and peroneal veins.    Summary: Right: There is no evidence of deep vein thrombosis in the lower extremity. However, portions of this examination were limited- see technologist comments above. No cystic structure found in the popliteal fossa. Left: There is no evidence of deep vein thrombosis in the lower extremity. However, portions of this examination were limited- see technologist comments above. No cystic structure found in the popliteal fossa.  *See table(s) above for measurements and observations. Electronically signed by Gretta Began MD on 10/03/2018 at 4:53:34 PM.    Final      Labs:   Basic Metabolic Panel: Recent Labs  Lab 10/01/18 1850 10/02/18 0333 10/04/18 0639  NA 143  143 147* 142  K 4.3   4.2 4.2 4.1  CL 109   107 111 108  CO2 28 27 25   GLUCOSE 112*   101* 91 89  BUN 15   17 11 7   CREATININE 1.00   0.90 0.77 0.88  CALCIUM 8.7* 8.7* 8.5*  MG  --   --  1.7  PHOS  --   --  3.3   GFR Estimated Creatinine Clearance: 117.4 mL/min (by C-G formula based on SCr of 0.88 mg/dL). Liver Function Tests: Recent Labs  Lab 10/01/18 1850  AST 47*  ALT 48*  ALKPHOS 77  BILITOT 0.8  PROT 5.6*  ALBUMIN 3.3*   No results for input(s): LIPASE, AMYLASE in the last 168 hours. Recent Labs  Lab 10/02/18 0952  AMMONIA 24   Coagulation profile Recent Labs  Lab 10/01/18 1850  INR 1.0    CBC: Recent Labs  Lab 10/01/18 1850 10/02/18 0333 10/04/18 0639  WBC 4.3 4.3 3.0*  NEUTROABS 2.6  --  1.3*  HGB 15.8   15.3 14.5 13.9  HCT 47.9    45.0 45.0 41.2  MCV 90.2 91.3 88.4  PLT 82* 70* 68*   Cardiac Enzymes: No results for input(s): CKTOTAL, CKMB, CKMBINDEX, TROPONINI in the last 168 hours. BNP: Invalid input(s): POCBNP CBG: Recent Labs  Lab 10/01/18 1846  GLUCAP 91   D-Dimer No results for input(s): DDIMER in the last 72 hours. Hgb A1c No results for input(s): HGBA1C in the last 72 hours. Lipid Profile No results for input(s): CHOL, HDL, LDLCALC, TRIG, CHOLHDL, LDLDIRECT in the last 72 hours. Thyroid function studies No results for input(s): TSH, T4TOTAL, T3FREE, THYROIDAB in the last 72 hours.  Invalid input(s): FREET3 Anemia work up No results for input(s): VITAMINB12, FOLATE, FERRITIN, TIBC, IRON, RETICCTPCT in the last 72 hours. Microbiology Recent Results (from the past 240 hour(s))  SARS Coronavirus 2 [facility](Hospital order, [facility]) Nasopharyngeal Nasopharyngeal Swab     Status: None   Collection Time: 10/01/18  7:34 PM   Specimen: Nasopharyngeal Swab  Result Value Ref Range Status   SARS Coronavirus 2 NEGATIVE NEGATIVE Final    Comment: (NOTE) If result is NEGATIVE SARS-CoV-2 target nucleic acids are NOT DETECTED. The SARS-CoV-2 RNA is generally detectable in upper and lower  respiratory specimens during the acute phase of infection. The lowest  concentration of SARS-CoV-2 viral copies this assay can detect is 250  copies / mL. A negative result does not preclude SARS-CoV-2 infection  and should not be used as the sole basis for treatment or other  patient management decisions.  A negative result may occur with  improper specimen collection / handling, submission of specimen other  than nasopharyngeal swab, presence of viral mutation(s) within the  areas targeted by this assay, and inadequate number of viral copies  (<250 copies / mL). A negative result must be combined with clinical  observations, patient history, and epidemiological information. If result is  POSITIVE SARS-CoV-2 target nucleic acids are DETECTED. The SARS-CoV-2 RNA is generally detectable in upper and lower  respiratory specimens dur ing the acute phase of infection.  Positive  results are indicative of active infection with SARS-CoV-2.  Clinical  correlation with patient history and other diagnostic information is  necessary to determine patient infection status.  Positive results do  not rule out bacterial infection or co-infection with other viruses. If result is PRESUMPTIVE POSTIVE SARS-CoV-2 nucleic acids MAY BE PRESENT.   A presumptive positive  result was obtained on the submitted specimen  and confirmed on repeat testing.  While 2019 novel coronavirus  (SARS-CoV-2) nucleic acids may be present in the submitted sample  additional confirmatory testing may be necessary for epidemiological  and / or clinical management purposes  to differentiate between  SARS-CoV-2 and other Sarbecovirus currently known to infect humans.  If clinically indicated additional testing with an alternate test  methodology [phone]) is advised. The SARS-CoV-2 RNA is generally  detectable in upper and lower respiratory sp ecimens during the acute  phase of infection. The expected result is Negative. Fact Sheet for Patients:  BoilerBrush.com.cy Fact Sheet for Healthcare Providers: https://pope.com/ This test is not yet approved or cleared by the [place] FDA and has been authorized for detection and/or diagnosis of SARS-CoV-2 by FDA under an Emergency Use Authorization (EUA).  This EUA will remain in effect (meaning this test can be used) for the duration of the COVID-19 declaration under Section 564(b)(1) of the Act, 21 U.S.C. section 360bbb-3(b)(1), unless the authorization is terminated or revoked sooner. Performed at [facility], [address] [place], [place] [address]     Signed: Lorin Glass  [facility] 10/05/2018, 10:33 AM

## 2018-10-05 NOTE — H&P (Signed)
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 Karel JarvisAquino as well as neurogenic bladder, asthma, hypertension.  Per chart review patient lives with sister and family.  He does have a personal care attendant.  He had [place] [facility] [place] [facility] [place] 2 [place]/week pre-COVID.  Per sister he would ambulate approximately 25 feet can ambulate to the bathroom needed some assistance for stand pivot to the chair.  Presented 10/01/2018 with garbled speech left side gaze.  Cranial CT scan negative for acute changes.  CTA head and neck left mid ICA short segment high-grade stenosis.  No large vessel occlusion.  COVID negative, valproic acid level 83, ammonia 24, platelets 70-82000, chemistries unremarkable.  CT perfusion scan showed no core infarct.  A follow-up cranial CT scan completed showing progressive posterior left MCA infarction.  Patient was not a candidate for any carotid intervention.  Lower extremity Dopplers negative.  Echocardiogram with ejection fraction of 60% no source of embolism.  EEG negative for seizure there was noted severe diffuse encephalopathy as well as cortical dysfunction in right hemisphere.  Neurology consulted currently maintained on low-dose aspirin.  Patient remains on Vimpat as well as valproate.  Patient initially had also been loaded with Keppra discontinued after EEG negative.  Patient currently remains n.p.o. with follow-up speech therapy and diet has since been upgraded to regular.   Therapy  evaluations completed and patient was admitted for a comprehensive rehab program   Review of Systems  Unable to perform ROS: Acuity of condition        Past Medical History:  Diagnosis Date   Asthma     Cardiomegaly 04/13/2016   Depression     DVT (deep venous thrombosis) (HCC)     GERD (gastroesophageal reflux disease)     Hemiplegia and hemiparesis      Left side   Hypertension     Neurogenic bladder     Neurogenic bowel     Neuropathy     Pulmonary embolism (HCC) 01/13/2016   Seizures (HCC)     Stroke (HCC) 12/31/2015    left side deficits    Subluxation of shoulder joint 04/13/2016    left   UTI (urinary tract infection)          Past Surgical History:  Procedure Laterality Date   CRANIECTOMY   2017   CRANIOPLASTY   2018   IVC FILTER INSERTION   2017   PEG TUBE PLACEMENT   2017   VENTRICULOPERITONEAL SHUNT   2018   Family History  Problem Relation Age of Onset   Stroke Maternal Uncle     Stroke Maternal Uncle     Social History:  reports that he has never smoked. He has never used smokeless tobacco. He reports previous alcohol use. He reports that he does not use drugs. Allergies:       Allergies  Allergen Reactions   Versed [Midazolam] Other (See Comments)      Drops blood pressure and heart rate  Medications Prior to Admission  Medication Sig Dispense Refill   aspirin EC 81 MG tablet Take 81 mg by mouth daily.       atorvastatin (LIPITOR) 80 MG tablet TAKE 1 TABLET BY MOUTH AT BEDTIME (Patient taking differently: Take 80 mg by mouth at bedtime. ) 90 tablet 1   baclofen (LIORESAL) 20 MG tablet Take 1 tablet (20 mg total) by mouth 2 (two) times daily. 180 each 3   Cholecalciferol (VITAMIN D3) 5000 units CAPS Take 5,000 Units by mouth daily.       clonazePAM (KLONOPIN) 0.5 MG disintegrating tablet Administer 1 tablet as needed for seizure. (Patient taking differently: Take 0.5 mg by mouth once as needed for seizure. ) 10  tablet 5   dantrolene (DANTRIUM) 100 MG capsule Take 1 tablet every night (Patient taking differently: Take 100 mg by mouth at bedtime. Take 1 tablet every night) 90 capsule 3   divalproex (DEPAKOTE ER) 500 MG 24 hr tablet Take 3 tabs twice a day (Patient taking differently: Take 1,000 mg by mouth 2 (two) times daily. ) 540 tablet 3   doxazosin (CARDURA) 1 MG tablet TAKE 1 TABLET BY MOUTH ONCE DAILY FOR URINARY RETENTION (Patient taking differently: Take 1 mg by mouth at bedtime. for urinary retention.) 90 tablet 1   DULoxetine (CYMBALTA) 60 MG capsule Take 1 capsule (60 mg total) by mouth daily. For depression. 90 capsule 3   ibuprofen (ADVIL,MOTRIN) 200 MG tablet Take 200 mg by mouth every 6 (six) hours as needed for headache.        lacosamide (VIMPAT) 200 MG TABS tablet Take 1 tablet (200 mg total) by mouth 2 (two) times daily. 180 tablet 3   Melatonin 3 MG TABS Take 3-6 mg by mouth at bedtime.        metoprolol tartrate (LOPRESSOR) 25 MG tablet TAKE 1 TABLET BY MOUTH TWICE A DAY (Patient taking differently: Take 25 mg by mouth 2 (two) times daily. ) 180 tablet 1   Multiple Vitamin (MULTIVITAMIN WITH MINERALS) TABS tablet Take 1 tablet by mouth daily.       ondansetron (ZOFRAN ODT) 8 MG disintegrating tablet Take 1 tablet (8 mg total) by mouth every 8 (eight) hours as needed. (Patient not taking: Reported on 10/01/2018) 6 tablet 0     Drug Regimen Review  Drug regimen was reviewed and remains appropriate with no significant issues identified   Home: Home Living Family/patient expects to be discharged to:: Private residence Living Arrangements: Other relatives Available Help at Discharge: Family, Available 24 hours/day, Personal care attendant Type of Home: House Additional Comments: family takes him on adventures to parks, out on a boat, to the mountains,. pt has a Chemical engineer. normally pre covid was going to [facility] [place] x2 per week  Lives With: Other  (Comment)(sister)   Functional History: Prior Function Level of Independence: Needs assistance Gait / Transfers Assistance Needed: 1 person (A) to stand pivot to chair. sister reports x2 (A) for ambulation more recently, no more than 25' to get to bathroom. ADL's / Homemaking Assistance Needed: max (A) for all   Functional Status:  Mobility: Bed Mobility Overal bed mobility: Needs Assistance Bed Mobility: Supine to Sit Rolling: Total assist, +2 for physical assistance Supine to sit: Total assist, +2 for physical assistance, +2 for safety/equipment, HOB elevated Sit to supine: Mod assist, HOB elevated General bed mobility comments: up in chair on arrival Transfers Overall transfer level: Needs assistance Equipment used: Hemi-walker Transfers: Sit  to/from Stand Sit to Stand: Min assist, Mod assist, +2 physical assistance Stand pivot transfers: Mod assist, +2 physical assistance General transfer comment: with hemi walker, +2 for safety and support on R hemi side. Multimodal cueing needed for engagement Ambulation/Gait Ambulation/Gait assistance: Min assist, Mod assist, +2 safety/equipment Gait Distance (Feet): (2-3 ft X 3 trials) Assistive device: Hemi-walker Gait Pattern/deviations: Step-through pattern, Decreased stance time - left, Decreased step length - right, Decreased step length - left, Decreased weight shift to left General Gait Details: L AFO and tennis shoes donned; pt agreeable to ambulate but only for very short distances and then requested to sit down; pt unable to state reason for needing to sit     ADL: ADL Overall ADL's : Needs assistance/impaired General ADL Comments: total (A) for all adls.    Cognition: Cognition Overall Cognitive Status: Difficult to assess Orientation Level: Oriented to person Cognition Arousal/Alertness: Awake/alert Behavior During Therapy: Flat affect, Impulsive Overall Cognitive Status: Difficult to assess Area of Impairment:  Attention, Memory, Following commands, Safety/judgement, Awareness, Problem solving Current Attention Level: Sustained Memory: Decreased recall of precautions Following Commands: Follows one step commands inconsistently Safety/Judgement: Decreased awareness of safety, Decreased awareness of deficits Awareness: Emergent Problem Solving: Difficulty sequencing, Requires verbal cues, Requires tactile cues General Comments: pt with fluctuating levels of engagement this date, at times not stating anything/other times talking about how "the wedding will be beautiful because the lord will bless it" Difficult to assess due to: Level of arousal   Physical Exam: Blood pressure (!) 146/94, pulse 71, temperature 98.4 F (36.9 C), temperature source Axillary, resp. rate 19, weight 88.3 kg, SpO2 97 %. Physical Exam  Constitutional:  Obese, sitting in bed with eyes closed  HENT:  Right Ear: External ear normal.  Left Ear: External ear normal.  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  Eyes:  Would allow me to test eyes  Neck: Normal range of motion. No thyromegaly present.  Cardiovascular: Normal rate and regular rhythm. Exam reveals no friction rub.  No murmur heard. Respiratory: Effort normal. No respiratory distress. He has no wheezes.  GI: Soft. He exhibits no distension. There is no abdominal tenderness.  Musculoskeletal:        General: No deformity or edema.  Neurological:  Pt sitting up in bed with eyes closed. Would occasionally mouth words or grunt in response to my verbal and tactile stimulation.  Seemed to move all 4 limbs but inconsistent and would not cooperate with MMT. Seemed to sense pain in all 4's.       Skin: Skin is warm.  Psychiatric:  Flat but easily agitated      Lab Results Last 48 Hours        Results for orders placed or performed during the hospital encounter of 10/01/18 (from the past 48 hour(s))  CBC with Differential/Platelet      Status: Abnormal    Collection Time: 10/04/18  6:39 AM  Result Value Ref Range    WBC 3.0 (L) 4.0 - 10.5 K/uL    RBC 4.66 4.22 - 5.81 MIL/uL    Hemoglobin 13.9 13.0 - 17.0 g/dL    HCT 41.2 39.0 - 52.0 %    MCV 88.4 80.0 - 100.0 fL    MCH 29.8 26.0 - 34.0 pg    MCHC 33.7 30.0 - 36.0 g/dL    RDW 14.6 11.5 - 15.5 %    Platelets 68 (L) 150 - 400 K/uL  Comment: REPEATED TO VERIFY Immature Platelet Fraction may be clinically indicated, consider ordering this additional test [id number] CONSISTENT WITH PREVIOUS RESULT      nRBC 0.0 0.0 - 0.2 %    Neutrophils Relative % 44 %    Neutro Abs 1.3 (L) 1.7 - 7.7 K/uL    Lymphocytes Relative 36 %    Lymphs Abs 1.1 0.7 - 4.0 K/uL    Monocytes Relative 17 %    Monocytes Absolute 0.5 0.1 - 1.0 K/uL    Eosinophils Relative 2 %    Eosinophils Absolute 0.1 0.0 - 0.5 K/uL    Basophils Relative 0 %    Basophils Absolute 0.0 0.0 - 0.1 K/uL    Immature Granulocytes 1 %    Abs Immature Granulocytes 0.03 0.00 - 0.07 K/uL      Comment: Performed at [facility], [address] [place], [place] [address]  Basic metabolic panel     Status: Abnormal    Collection Time: 10/04/18  6:39 AM  Result Value Ref Range    Sodium 142 135 - 145 mmol/L    Potassium 4.1 3.5 - 5.1 mmol/L    Chloride 108 98 - 111 mmol/L    CO2 25 22 - 32 mmol/L    Glucose, Bld 89 70 - 99 mg/dL    BUN 7 6 - 20 mg/dL    Creatinine, Ser 1.19 0.61 - 1.24 mg/dL    Calcium 8.5 (L) 8.9 - 10.3 mg/dL    GFR calc non Af Amer >60 >60 mL/min    GFR calc Af Amer >60 >60 mL/min    Anion gap 9 5 - 15      Comment: Performed at [facility], [address] [place], [place] [address]  Magnesium     Status: None    Collection Time: 10/04/18  6:39 AM  Result Value Ref Range    Magnesium 1.7 1.7 - 2.4 mg/dL      Comment: Performed at [facility], [address], [place], [place] [address]  Phosphorus     Status: None    Collection Time: 10/04/18  6:39 AM  Result Value Ref  Range    Phosphorus 3.3 2.5 - 4.6 mg/dL      Comment: Performed at [facility], [address] [place], [place] [address]  Rapid urine drug screen (hospital performed)     Status: None    Collection Time: 10/04/18  1:45 PM  Result Value Ref Range    Opiates NONE DETECTED NONE DETECTED    Cocaine NONE DETECTED NONE DETECTED    Benzodiazepines NONE DETECTED NONE DETECTED    Amphetamines NONE DETECTED NONE DETECTED    Tetrahydrocannabinol NONE DETECTED NONE DETECTED    Barbiturates NONE DETECTED NONE DETECTED      Comment: (NOTE) DRUG SCREEN FOR MEDICAL PURPOSES ONLY.  IF CONFIRMATION IS NEEDED FOR ANY PURPOSE, NOTIFY LAB WITHIN 5 DAYS. LOWEST DETECTABLE LIMITS FOR URINE DRUG SCREEN Drug Class                     Cutoff (ng/mL) Amphetamine and metabolites    1000 Barbiturate and metabolites    200 Benzodiazepine                 200 Tricyclics and metabolites     300 Opiates and metabolites        300 Cocaine and metabolites        300 THC  50 Performed at [facility], [address] [place], [place] [id number]        Imaging Results (Last 48 hours)  Ct Head Wo Contrast   Result Date: 10/04/2018 CLINICAL DATA:  Follow-up stroke EXAM: CT HEAD WITHOUT CONTRAST TECHNIQUE: Contiguous axial images were obtained from the base of the skull through the vertex without intravenous contrast. COMPARISON:  10/02/2018 FINDINGS: Brain: Changes consistent with an evolving left MCA infarct are noted involving the left temporal and parietooccipital regions. This shows better demarcation when compared with the prior exam consistent with the edematous changes. Mild mass-effect upon the lateral ventricle on the left is seen. Midline shift is noted from left to right although likely compensatory to the significant encephalomalacia from prior right MCA infarct. No new focal infarct is seen. Ventricular peritoneal shunt is noted in place. No acute hemorrhage is noted.  Vascular: No hyperdense vessel or unexpected calcification. Skull: Changes of a prior right craniotomy are noted. These are stable from the prior exam. No acute bony abnormality is noted. Sinuses/Orbits: No acute finding. Other: None. IMPRESSION: Progressive decreased attenuation related to a posterior left MCA infarct as described. Changes consistent with large right MCA infarct and severe encephalomalacia. This is stable from the prior exam. No new focal abnormality is seen. Electronically Signed   By: Alcide CleverMark  Lukens M.D.   On: 10/04/2018 07:19            Medical Problem List and Plan: 1.  Slurred speech left side gaze secondary to left MCA infarction Wernicke's area secondary to left mid ICA short segment high-grade stenosis. Patient has hx of  prior right MCA infarction as well as  VP shunt for hydrocephalus             -admit to inpatient rehab             -enclosure bed for safety 2.  Antithrombotics: -DVT/anticoagulation: SCDs.  Venous Dopplers negative             -antiplatelet therapy: Aspirin 81 mg daily 3. Pain Management: Tylenol as needed 4. Mood: Provide emotional support             -antipsychotic agents: N/A 5. Neuropsych: This patient is not capable of making decisions on his own behalf. 6. Skin/Wound Care: Routine skin checks 7. Fluids/Electrolytes/Nutrition: Routine in and outs with follow-up chemistries on admit.  8.  Seizure disorder.  Vimpat 100 mg twice daily, Depakote 1000 mg twice daily.  EEG negative 9.  Dysphagia.  Follow-up speech therapy.  Diet has been advanced to regular as of 10/04/2018. Will need supervision +/- encouragement potentially to eat. 10.  Hypertension.  Permissive hypertension.  Patient on metoprolol 25 mg twice daily prior to admission.  Resume as needed 11.  Hyperlipidemia.  Lipitor 80 mg daily 12.  Neurogenic bladder.  Check PVRs and voiding patterns.  13.  Chronic thrombocytopenia.  Stable     Mcarthur RossettiDaniel J Angiulli, PA-C 10/05/2018  I have  personally performed a face to face diagnostic evaluation of this patient and formulated the key components of the plan.  Additionally, I have personally reviewed laboratory data, imaging studies, as well as relevant notes and concur with the physician assistant's documentation above.  Ranelle OysterZachary T. Jahkeem Kurka, MD, [place] DomFAAPMR

## 2018-10-05 NOTE — Progress Notes (Signed)
Patient ID: Patrick Franklin, male   DOB: Jun 17, 1970, 48 y.o.   MRN: [id number] Patient was admitted to 4W10 via wheelchair, escorted by nursing staff and placed in enclosure bed.  Patient unable to verbalize understanding of rehab process including fall prevention policy, personal belongings policy, and visitation policy due to confusion and agitation.    Brita Romp, RN

## 2018-10-05 NOTE — Progress Notes (Signed)
Physical Therapy Treatment Patient Details Name: Patrick RatelChristopher L Franklin MRN: [id number] DOB: [birth year] Today's Date: 10/05/2018    History of Present Illness Patient is a 48 y/o male who presents with episode of left gaze preference and garbled speech. 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 Head CT- evolving hypodensity in posterior left temporal occipital region and consistent with evolving left posterior MCA infarct. More likely concern for seizures. 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    PT Comments    Patient received in bed, sister present. Patient verbal occasionally, however tangential thoughts. Patient requires mod assist for supine to sit and sit to stand. Ambulated 6 feet then 2-3 more feet with hemiwalker and mod assist. Chair close behind for safety. Patient unable to follow directions consistently despite multimodal cues. Sits down without warning, Easily distracted during session. Patient will continue to benefit from skilled PT to improve safety and to improve mobility.       Follow Up Recommendations  CIR;Supervision for mobility/OOB     Equipment Recommendations  None recommended by PT    Recommendations for Other Services Rehab consult     Precautions / Restrictions Precautions Precautions: Fall Precaution Comments: seizures, L sided weakness at baseline Restrictions Weight Bearing Restrictions: No    Mobility  Bed Mobility Overal bed mobility: Needs Assistance Bed Mobility: Supine to Sit     Supine to sit: +2 for physical assistance;Min assist;Mod assist        Transfers Overall transfer level: Needs assistance Equipment used: Hemi-walker Transfers: Sit to/from Stand Sit to Stand: Min assist;+2 safety/equipment;+2 physical assistance          General transfer comment: max cues needed for engagement and for direction following.  Ambulation/Gait Ambulation/Gait assistance: Mod assist;+2 physical assistance;+2 safety/equipment Gait Distance (Feet): 8 Feet Assistive device: Hemi-walker Gait Pattern/deviations: Step-to pattern;Narrow base of support;Decreased stride length Gait velocity: decreased   General Gait Details: L AFO and shoes donned for patient. ambulates fairly well but is easily distracted and difficulty with following directions   Stairs             Wheelchair Mobility    Modified Rankin (Stroke Patients Only) Modified Rankin (Stroke Patients Only) Pre-Morbid Rankin Score: Moderately severe disability Modified Rankin: Moderately severe disability     Balance Overall balance assessment: Needs assistance Sitting-balance support: Feet supported Sitting balance-Leahy Scale: Fair     Standing balance support: Single extremity supported Standing balance-Leahy Scale: Poor Standing balance comment: reliant on external support                            Cognition Arousal/Alertness: Awake/alert Behavior During Therapy: Flat affect;Impulsive Overall Cognitive Status: Difficult to assess Area of Impairment: Orientation;Awareness;Attention;Problem solving;Following commands;Safety/judgement                 Orientation Level: Disoriented to;Time;Situation   Memory: Decreased recall of precautions;Decreased short-term memory Following Commands: Follows one step commands inconsistently Safety/Judgement: Decreased awareness of safety;Decreased awareness of deficits   Problem Solving: Slow processing;Decreased initiation;Requires verbal cues;Requires tactile cues;Difficulty sequencing General Comments: pt with fluctuating levels of engagement this date, speech/thoughts tangential.      Exercises      General Comments        Pertinent Vitals/Pain Pain Assessment: No/denies pain     Home Living  Prior Function            PT Goals (current goals can now be found in the care plan section) Acute Rehab PT Goals Patient Stated Goal: to go to CIR today PT Goal Formulation: With patient Time For Goal Achievement: 10/16/18 Potential to Achieve Goals: Good Progress towards PT goals: Progressing toward goals    Frequency    Min 4X/week      PT Plan Current plan remains appropriate    Co-evaluation              AM-PAC PT "6 Clicks" Mobility   Outcome Measure  Help needed turning from your back to your side while in a flat bed without using bedrails?: A Lot Help needed moving from lying on your back to sitting on the side of a flat bed without using bedrails?: A Lot Help needed moving to and from a bed to a chair (including a wheelchair)?: A Lot Help needed standing up from a chair using your arms (e.g., wheelchair or bedside chair)?: A Lot Help needed to walk in hospital room?: A Lot Help needed climbing 3-5 steps with a railing? : Total 6 Click Score: 11    End of Session Equipment Utilized During Treatment: Gait belt Activity Tolerance: Patient tolerated treatment well;Other (comment)(limited by cognition) Patient left: in chair;with chair alarm set;with nursing/sitter in room Nurse Communication: Mobility status PT Visit Diagnosis: Unsteadiness on feet (R26.81);Hemiplegia and hemiparesis;Difficulty in walking, not elsewhere classified (R26.2) Hemiplegia - Right/Left: Left Hemiplegia - dominant/non-dominant: Non-dominant Hemiplegia - caused by: Cerebral infarction     Time: 1305-1330 PT Time Calculation (min) (ACUTE ONLY): 25 min  Charges:  $Gait Training: 8-22 mins $Therapeutic Exercise: 8-22 mins                     Owen Pagnotta, PT, GCS 10/05/18,1:46 PM

## 2018-10-05 NOTE — Progress Notes (Signed)
Meredith Staggers, MD  Physician  Physical Medicine and Rehabilitation  PMR Pre-admission  Signed  Date of Service:  10/05/2018 10:07 AM      Related encounter: ED to Hosp-Admission (Current) from 10/01/2018 in [facility]      Signed         Show:Clear all [x] Manual[x] Template[x] Copied  Added by: [x] Meredith Staggers, MD[x] Michel Santee, PT  [] Hover for details PMR Admission Coordinator Pre-Admission Assessment  Patient: Patrick Franklin is an 48 y.o., male MRN: [id number] DOB: 08/30/1970 Height:   Weight: 88.3 kg  Insurance Information HMO:     PPO:      PCP:      IPA:      80/20:      OTHER:  PRIMARY: Medicare A and B      Policy#: [id number]      Subscriber: patient CM Name:       Phone#:      Fax#:  Pre-Cert#: verified eligibility online      Employer:  Benefits:  Phone #:      Name:  Eff. Date:       Deduct: $1408      Out of Pocket Max: n/a      Life Max: n/a CIR: 100%      SNF: 20 full days Outpatient: 80%     Co-Pay: 20% Home Health: 100%      Co-Pay:  DME: 80%     Co-Pay: 20% Providers: pt choice  SECONDARY:       Policy#:       Subscriber:  CM Name:       Phone#:      Fax#:  Pre-Cert#:       Employer:  Benefits:  Phone #:      Name:  Eff. Date:      Deduct:       Out of Pocket Max:       Life Max:  CIR:       SNF:  Outpatient:      Co-Pay:  Home Health:       Co-Pay:  DME:      Co-Pay:   Medicaid Application Date:       Case Manager:  Disability Application Date:       Case Worker:   The Data Collection Information Summary for patients in Inpatient Rehabilitation Facilities with attached Privacy Act [place] Records was provided and verbally reviewed with: Family  Emergency Contact Information         Contact Information    Name Relation Home Work Mobile   Vance Gather Sister [phone]  [phone]   Esperanza Heir Relative   [phone]      Current Medical History  Patient  Admitting Diagnosis: L MCA  History of Present Illness: Patrick Franklin a 48 year old right-handed male with 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 Rickard Patience well as neurogenic bladder, asthma, hypertension.  Presented 10/01/2018 with garbled speech left side gaze. Cranial CT scan negative for acute changes. CTA head and neck left mid ICA short segment high-grade stenosis. No large vessel occlusion. COVID negative, valproic acid level 83, ammonia 24, platelets 70-82000, chemistries unremarkable. CT perfusion scan showed no core infarct. A follow-up cranial CT scan  completed showing progressive posterior left MCA infarction. Patient was not a candidate for any carotid intervention. Lower extremity Dopplers negative. Echocardiogram with ejection fraction of 60% no source of embolism. EEG negative for seizure there was noted severe diffuse encephalopathy as well as cortical dysfunction in right hemisphere. Neurology consulted currently maintained on low-dose aspirin. Patient remains on Vimpat as well as valproate. Patient initially had also been loaded with Keppra discontinued after EEG negative. Patient currently remains n.p.o. with follow-up speech therapyand diet has since been upgraded to regular. Therapy evaluations completed and patient was recommended for a comprehensive rehab program. Complete NIHSS TOTAL: 14  Patient's medical record from [facility]  has been reviewed by the rehabilitation admission coordinator and physician.  Past Medical History      Past Medical History:  Diagnosis Date   Asthma    Cardiomegaly 04/13/2016   Depression    DVT (deep venous thrombosis)  (HCC)    GERD (gastroesophageal reflux disease)    Hemiplegia and hemiparesis    Left side   Hypertension    Neurogenic bladder    Neurogenic bowel    Neuropathy    Pulmonary embolism ([place]) 01/13/2016   Seizures ([place])    Stroke ([place]) 12/31/2015   left side deficits    Subluxation of shoulder joint 04/13/2016   left   UTI (urinary tract infection)     Family History   family history includes Stroke in his maternal uncle and maternal uncle.  Prior Rehab/Hospitalizations Has the patient had prior rehab or hospitalizations prior to admission? No  Has the patient had major surgery during 100 days prior to admission? No              Current Medications  Current Facility-Administered Medications:    0.9 %  sodium chloride infusion, , Intravenous, Continuous, Shela Leff, MD, Last Rate: 100 mL/hr at 10/04/18 0223   acetaminophen (TYLENOL) tablet 650 mg, 650 mg, Oral, Q4H PRN, 650 mg at 10/04/18 1814 **OR** acetaminophen (TYLENOL) solution 650 mg, 650 mg, Per Tube, Q4H PRN **OR** acetaminophen (TYLENOL) suppository 650 mg, 650 mg, Rectal, Q4H PRN, Shela Leff, MD, 650 mg at 10/03/18 2202   aspirin EC tablet 81 mg, 81 mg, Oral, Daily, 81 mg at 10/05/18 0847 **OR** [DISCONTINUED] aspirin suppository 300 mg, 300 mg, Rectal, Daily, Rosalin Hawking, MD, 300 mg at 10/04/18 0849   atorvastatin (LIPITOR) tablet 80 mg, 80 mg, Oral, QHS, Rosalin Hawking, MD   divalproex (DEPAKOTE) DR tablet 1,000 mg, 1,000 mg, Oral, Q12H, Ghimire, Kuber, MD, 1,000 mg at 10/05/18 0848   lacosamide (VIMPAT) tablet 200 mg, 200 mg, Oral, BID, Rosalin Hawking, MD, 200 mg at 10/05/18 1537   MEDLINE mouth rinse, 15 mL, Mouth Rinse, BID, Barb Merino, MD, 15 mL at 10/05/18 0907  Patients Current Diet:     Diet Order                  Diet regular Room service appropriate? Yes; Fluid consistency: Thin  Diet effective now               Precautions /  Restrictions Precautions Precautions: Fall Precaution Comments: seizures, L sided weakness at baseline Restrictions Weight Bearing Restrictions: No   Has the patient had 2 or more falls or a fall with injury in the past year? Yes  Prior Activity Level Limited Community (1-2x/wk): pre-covid was going to adult daycare 2x/week, since covid has been largely home bound. was able to ambulate some with min +2 assist,  transfers with CGA, mod I w/c mobility  Prior Functional Level Self Care: Did the patient need help bathing, dressing, using the toilet or eating? Needed some help  Indoor Mobility: Did the patient need assistance with walking from room to room (with or without device)? Needed some help  Stairs: Did the patient need assistance with internal or external stairs (with or without device)? Dependent  Functional Cognition: Did the patient need help planning regular tasks such as shopping or remembering to take medications? Needed some help  Home Assistive Devices / Equipment    Prior Device Use: Indicate devices/aids used by the patient prior to current illness, exacerbation or injury? Manual wheelchair and Orthotics/Prosthetics  Current Functional Level Cognition  Overall Cognitive Status: Difficult to assess Difficult to assess due to: Level of arousal Current Attention Level: Sustained Orientation Level: Oriented to person Following Commands: Follows one step commands inconsistently Safety/Judgement: Decreased awareness of safety, Decreased awareness of deficits General Comments: pt with fluctuating levels of engagement this date, at times not stating anything/other times talking about how "the wedding will be beautiful because the lord will bless it"    Extremity Assessment (includes Sensation/Coordination)  Upper Extremity Assessment: Defer to OT evaluation LUE Coordination: decreased fine motor, decreased gross motor  Lower Extremity Assessment: Difficult to  assess due to impaired cognition(no voluntary movement of BLEs)    ADLs  Overall ADL's : Needs assistance/impaired General ADL Comments: total (A) for all adls.     Mobility  Overal bed mobility: Needs Assistance Bed Mobility: Supine to Sit Rolling: Total assist, +2 for physical assistance Supine to sit: Total assist, +2 for physical assistance, +2 for safety/equipment, HOB elevated Sit to supine: Mod assist, HOB elevated General bed mobility comments: up in chair on arrival    Transfers  Overall transfer level: Needs assistance Equipment used: Hemi-walker Transfers: Sit to/from Stand Sit to Stand: Min assist, Mod assist, +2 physical assistance Stand pivot transfers: Mod assist, +2 physical assistance General transfer comment: with hemi walker, +2 for safety and support on R hemi side. Multimodal cueing needed for engagement    Ambulation / Gait / Stairs / Wheelchair Mobility  Ambulation/Gait Ambulation/Gait assistance: Min assist, Mod assist, +2 safety/equipment Gait Distance (Feet): (2-3 ft X 3 trials) Assistive device: Hemi-walker Gait Pattern/deviations: Step-through pattern, Decreased stance time - left, Decreased step length - right, Decreased step length - left, Decreased weight shift to left General Gait Details: L AFO and tennis shoes donned; pt agreeable to ambulate but only for very short distances and then requested to sit down; pt unable to state reason for needing to sit    Posture / Balance Dynamic Sitting Balance Sitting balance - Comments: sitting in chair with close assist Balance Overall balance assessment: Needs assistance Sitting-balance support: Feet supported, Bilateral upper extremity supported, Single extremity supported Sitting balance-Leahy Scale: Fair Sitting balance - Comments: sitting in chair with close assist Postural control: Right lateral lean Standing balance support: Single extremity supported Standing balance-Leahy Scale:  Poor Standing balance comment: reliant on external support    Special needs/care consideration BiPAP/CPAP no CPM no Continuous Drip IV no Dialysis no        Days n/a Life Vest no Oxygen no Special Bed enclosure bed Trach Size no Wound Vac (area) no      Location n/a Skin intact                              Location  Bowel mgmt: incontinent 9/10 last BM Bladder mgmt: incontinent Diabetic mgmt: yes Behavioral consideration yes Chemo/radiation no   Previous Home Environment (from acute therapy documentation) Living Arrangements: Other relatives  Lives With: Other (Comment)(sister) Available Help at Discharge: Family, Available 24 hours/day, Personal care attendant Type of Home: House Additional Comments: family takes him on adventures to parks, out on a boat, to the mountains,. pt has a Environmental health practitioner. normally pre covid was going to adullt day program in [place] x2 per week  Discharge Living Setting Plans for Discharge Living Setting: Other (Comment)(SNF) Type of Home at Discharge: [place] Name at Discharge: (tbd) Discharge Bathroom Accessibility: Yes Does the patient have any problems obtaining your medications?: No  Social/Family/Support Systems Anticipated Caregiver: [name] sister, Almyra Free, is contact. Plan for d/c to SNF for more rehab and/or long term placement Anticipated Caregiver's Contact Information: [phone] Ability/Limitations of Caregiver: has been providing 24/7 care for pt for the last 12-18 months, had discussed long term placement with pt in January but had deferred 2/2 covid starting Discharge Plan Discussed with Primary Caregiver: Yes Is Caregiver In Agreement with Plan?: Yes Does Caregiver/Family have Issues with Lodging/Transportation while Pt is in Rehab?: No  Goals/Additional Needs Patient/Family Goal for Rehab: PT/OT min assist, SLP mod assist Expected length of stay: 18-21 days Dietary Needs: reg/thin Equipment  Needs: veil bed Additional Information: Pt requiring 1:1 sitter in acute, transition to veil bed per Dr. Naaman Plummer to allow pt to safely begin rehab program Pt/Family Agrees to Admission and willing to participate: Yes Program Orientation Provided & Reviewed with Pt/Caregiver Including Roles  & Responsibilities: Yes  Decrease burden of Care through IP rehab admission: Specialzed equipment needs and Decrease number of caregivers  Possible need for SNF placement upon discharge: yes, plan for continued rehab, if needed.   Patient Condition: I have reviewed medical records from [facility], spoken with CSW, and patient and family member. I met with patient at the bedside for inpatient rehabilitation assessment.  Patient will benefit from ongoing PT, OT and SLP, can actively participate in 3 hours of therapy a day 5 days of the week, and can make measurable gains during the admission.  Patient will also benefit from the coordinated team approach during an Inpatient Acute Rehabilitation admission.  The patient will receive intensive therapy as well as Rehabilitation physician, nursing, social worker, and care management interventions.  Due to bladder management, bowel management, safety, skin/wound care, disease management, medication administration, pain management and patient education the patient requires 24 hour a day rehabilitation nursing.  The patient is currently mod to max+2  with mobility and basic ADLs.  Discharge setting and therapy post discharge at skilled nursing facility is anticipated.  Patient has agreed to participate in the Acute Inpatient Rehabilitation Program and will admit today.  Preadmission Screen Completed By:  Michel Santee, PT, DPT 10/05/2018 10:08 AM ______________________________________________________________________   Discussed status with Dr. Naaman Plummer on 10/05/18  at 10:15 AM  and received approval for admission today.  Admission Coordinator:  Michel Santee,  PT, DPT time 10:16 AM Sudie Grumbling 10/05/18    Assessment/Plan: Diagnosis: left MCA infarct 1. Does the need for close, 24 hr/day Medical supervision in concert with the patient's rehab needs make it unreasonable for this patient to be served in a less intensive setting? Yes 2. Co-Morbidities requiring supervision/potential complications: hx of right MCA infarct and hydrocephalus/vps, sz disorder 3. Due to bladder management, bowel management, safety, skin/wound care, disease management, medication administration,  pain management and patient education, does the patient require 24 hr/day rehab nursing? Yes 4. Does the patient require coordinated care of a physician, rehab nurse, PT (1-2 hrs/day, 5 days/week), OT (1-2 hrs/day, 5 days/week) and SLP (1-2 hrs/day, 5 days/week) to address physical and functional deficits in the context of the above medical diagnosis(es)? Yes Addressing deficits in the following areas: balance, endurance, locomotion, strength, transferring, bowel/bladder control, bathing, dressing, feeding, grooming, toileting, cognition, speech, swallowing and psychosocial support 5. Can the patient actively participate in an intensive therapy program of at least 3 hrs of therapy 5 days a week? Yes 6. The potential for patient to make measurable gains while on inpatient rehab is good 7. Anticipated functional outcomes upon discharge from inpatients are: min assist PT, min assist OT, mod assist SLP 8. Estimated rehab length of stay to reach the above functional goals is: 18-21 days 9. Anticipated D/C setting: Home 10. Anticipated post D/C treatments: [place] therapy 11. Overall Rehab/Functional Prognosis: good  MD Signature: Meredith Staggers, MD, [facility] 10/05/2018         Revision History

## 2018-10-05 NOTE — PMR Pre-admission (Signed)
PMR Admission Coordinator Pre-Admission Assessment  Patient: Patrick Franklin is an 48 y.o., male MRN: [id number] DOB: 10-22-1970 Height:   Weight: 88.3 kg  Insurance Information HMO:     PPO:      PCP:      IPA:      80/20:      OTHER:  PRIMARY: Medicare A and B      Policy#: [id number]      Subscriber: patient CM Name:       Phone#:      Fax#:  Pre-Cert#: verified eligibility online      Employer:  Benefits:  Phone #:      Name:  Eff. Date:       Deduct: $1408      Out of Pocket Max: n/a      Life Max: n/a CIR: 100%      SNF: 20 full days Outpatient: 80%     Co-Pay: 20% Home Health: 100%      Co-Pay:  DME: 80%     Co-Pay: 20% Providers: pt choice  SECONDARY:       Policy#:       Subscriber:  CM Name:       Phone#:      Fax#:  Pre-Cert#:       Employer:  Benefits:  Phone #:      Name:  Eff. Date:      Deduct:       Out of Pocket Max:       Life Max:  CIR:       SNF:  Outpatient:      Co-Pay:  Home Health:       Co-Pay:  DME:      Co-Pay:   Medicaid Application Date:       Case Manager:  Disability Application Date:       Case Worker:   The "Data Collection Information Summary" for patients in Inpatient Rehabilitation Facilities with attached "Privacy Act Statement-Health Care Records" was provided and verbally reviewed with: Family  Emergency Contact Information Contact Information    Name Relation Home Work Mobile   Willette Pa Sister [phone]  [phone]   Darlis Loan Relative   [phone]      Current Medical History  Patient Admitting Diagnosis: L MCA  History of Present Illness: Patrick Franklin is a 48 year old right-handed male with 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 Karel Jarvis as well as neurogenic bladder, asthma, hypertension.   Presented 10/01/2018 with garbled speech left side gaze.  Cranial CT scan negative for acute changes.  CTA head and neck left mid ICA short segment high-grade stenosis.  No large vessel occlusion.  COVID negative, valproic acid level 83, ammonia 24, platelets 70-82000, chemistries unremarkable.  CT perfusion scan showed no core infarct.  A follow-up cranial CT scan completed showing progressive posterior left MCA infarction.  Patient was not a candidate for any carotid intervention.  Lower extremity Dopplers negative.  Echocardiogram with ejection fraction of 60% no source of embolism.  EEG negative for seizure there was noted severe diffuse encephalopathy as well as cortical dysfunction in right hemisphere.  Neurology consulted currently maintained on low-dose aspirin.  Patient remains on Vimpat as well as valproate.  Patient initially  had also been loaded with Keppra discontinued after EEG negative.  Patient currently remains n.p.o. with follow-up speech therapy and diet has since been upgraded to regular.   Therapy evaluations completed and patient was recommended for a comprehensive rehab program. Complete NIHSS TOTAL: 14  Patient's medical record from [facility]  has been reviewed by the rehabilitation admission coordinator and physician.  Past Medical History  Past Medical History:  Diagnosis Date  . Asthma   . Cardiomegaly 04/13/2016  . Depression   . DVT (deep venous thrombosis) (HCC)   . GERD (gastroesophageal reflux disease)   . Hemiplegia and hemiparesis    Left side  . Hypertension   . Neurogenic bladder   . Neurogenic bowel   . Neuropathy   . Pulmonary embolism (HCC) 01/13/2016  . Seizures (HCC)   . Stroke (HCC) 12/31/2015   left side deficits   . Subluxation of shoulder joint 04/13/2016   left  . UTI (urinary tract infection)     Family History   family history includes Stroke in his  maternal uncle and maternal uncle.  Prior Rehab/Hospitalizations Has the patient had prior rehab or hospitalizations prior to admission? No  Has the patient had major surgery during 100 days prior to admission? No   Current Medications  Current Facility-Administered Medications:  .  0.9 %  sodium chloride infusion, , Intravenous, Continuous, John Giovanni, MD, Last Rate: 100 mL/hr at 10/04/18 0223 .  acetaminophen (TYLENOL) tablet 650 mg, 650 mg, Oral, Q4H PRN, 650 mg at 10/04/18 1814 **OR** acetaminophen (TYLENOL) solution 650 mg, 650 mg, Per Tube, Q4H PRN **OR** acetaminophen (TYLENOL) suppository 650 mg, 650 mg, Rectal, Q4H PRN, John Giovanni, MD, 650 mg at 10/03/18 2202 .  aspirin EC tablet 81 mg, 81 mg, Oral, Daily, 81 mg at 10/05/18 0847 **OR** [DISCONTINUED] aspirin suppository 300 mg, 300 mg, Rectal, Daily, Marvel Plan, MD, 300 mg at 10/04/18 0849 .  atorvastatin (LIPITOR) tablet 80 mg, 80 mg, Oral, QHS, Marvel Plan, MD .  divalproex (DEPAKOTE) DR tablet 1,000 mg, 1,000 mg, Oral, Q12H, Dorcas Carrow, MD, 1,000 mg at 10/05/18 0848 .  lacosamide (VIMPAT) tablet 200 mg, 200 mg, Oral, BID, Marvel Plan, MD, 200 mg at 10/05/18 0959 .  MEDLINE mouth rinse, 15 mL, Mouth Rinse, BID, Dorcas Carrow, MD, 15 mL at 10/05/18 0907  Patients Current Diet:  Diet Order            Diet regular Room service appropriate? Yes; Fluid consistency: Thin  Diet effective now              Precautions / Restrictions Precautions Precautions: Fall Precaution Comments: seizures, L sided weakness at baseline Restrictions Weight Bearing Restrictions: No   Has the patient had 2 or more falls or a fall with injury in the past year? Yes  Prior Activity Level Limited Community (1-2x/wk): pre-covid was going to adult daycare 2x/week, since covid has been largely home bound. was able to ambulate some with min +2 assist, transfers with CGA, mod I w/c mobility  Prior Functional Level Self Care: Did  the patient need help bathing, dressing, using the toilet or eating? Needed some help  Indoor Mobility: Did the patient need assistance with walking from room to room (with or without device)? Needed some help  Stairs: Did the patient need assistance with internal or external stairs (with or without device)? Dependent  Functional Cognition: Did the patient need help planning regular tasks such as shopping or remembering to take medications? Needed  some help  Home Assistive Devices / Equipment    Prior Device Use: Indicate devices/aids used by the patient prior to current illness, exacerbation or injury? Manual wheelchair and Orthotics/Prosthetics  Current Functional Level Cognition  Overall Cognitive Status: Difficult to assess Difficult to assess due to: Level of arousal Current Attention Level: Sustained Orientation Level: Oriented to person Following Commands: Follows one step commands inconsistently Safety/Judgement: Decreased awareness of safety, Decreased awareness of deficits General Comments: pt with fluctuating levels of engagement this date, at times not stating anything/other times talking about how "the wedding will be beautiful because the lord will bless it"    Extremity Assessment (includes Sensation/Coordination)  Upper Extremity Assessment: Defer to OT evaluation LUE Coordination: decreased fine motor, decreased gross motor  Lower Extremity Assessment: Difficult to assess due to impaired cognition(no voluntary movement of BLEs)    ADLs  Overall ADL's : Needs assistance/impaired General ADL Comments: total (A) for all adls.     Mobility  Overal bed mobility: Needs Assistance Bed Mobility: Supine to Sit Rolling: Total assist, +2 for physical assistance Supine to sit: Total assist, +2 for physical assistance, +2 for safety/equipment, HOB elevated Sit to supine: Mod assist, HOB elevated General bed mobility comments: up in chair on arrival    Transfers  Overall  transfer level: Needs assistance Equipment used: Hemi-walker Transfers: Sit to/from Stand Sit to Stand: Min assist, Mod assist, +2 physical assistance Stand pivot transfers: Mod assist, +2 physical assistance General transfer comment: with hemi walker, +2 for safety and support on R hemi side. Multimodal cueing needed for engagement    Ambulation / Gait / Stairs / Wheelchair Mobility  Ambulation/Gait Ambulation/Gait assistance: Min assist, Mod assist, +2 safety/equipment Gait Distance (Feet): (2-3 ft X 3 trials) Assistive device: Hemi-walker Gait Pattern/deviations: Step-through pattern, Decreased stance time - left, Decreased step length - right, Decreased step length - left, Decreased weight shift to left General Gait Details: L AFO and tennis shoes donned; pt agreeable to ambulate but only for very short distances and then requested to sit down; pt unable to state reason for needing to sit    Posture / Balance Dynamic Sitting Balance Sitting balance - Comments: sitting in chair with close assist Balance Overall balance assessment: Needs assistance Sitting-balance support: Feet supported, Bilateral upper extremity supported, Single extremity supported Sitting balance-Leahy Scale: Fair Sitting balance - Comments: sitting in chair with close assist Postural control: Right lateral lean Standing balance support: Single extremity supported Standing balance-Leahy Scale: Poor Standing balance comment: reliant on external support    Special needs/care consideration BiPAP/CPAP no CPM no Continuous Drip IV no Dialysis no        Days n/a Life Vest no Oxygen no Special Bed enclosure bed Trach Size no Wound Vac (area) no      Location n/a Skin intact                              Location  Bowel mgmt: incontinent 9/10 last BM Bladder mgmt: incontinent Diabetic mgmt: yes Behavioral consideration yes Chemo/radiation no   Previous Home Environment (from acute therapy  documentation) Living Arrangements: Other relatives  Lives With: Other (Comment)(sister) Available Help at Discharge: Family, Available 24 hours/day, Personal care attendant Type of Home: House Additional Comments: family takes him on adventures to parks, out on a boat, to the mountains,. pt has a Chemical engineer. normally pre covid was going to [facility] day program in [place] x2 per week  Discharge Living Setting Plans for Discharge Living Setting: Other (Comment)(SNF) Type of Home at Discharge: Skilled Nursing Facility Care Facility Name at Discharge: (tbd) Discharge Bathroom Accessibility: Yes Does the patient have any problems obtaining your medications?: No  Social/Family/Support Systems Anticipated Caregiver: [name] sister, [name], is contact. Plan for d/c to SNF for more rehab and/or long term placement Anticipated Caregiver's Contact Information: [phone] Ability/Limitations of Caregiver: has been providing 24/7 care for pt for the last 12-18 months, had discussed long term placement with pt in January but had deferred 2/2 covid starting Discharge Plan Discussed with Primary Caregiver: Yes Is Caregiver In Agreement with Plan?: Yes Does Caregiver/Family have Issues with Lodging/Transportation while Pt is in Rehab?: No  Goals/Additional Needs Patient/Family Goal for Rehab: PT/OT min assist, SLP mod assist Expected length of stay: 18-21 days Dietary Needs: reg/thin Equipment Needs: veil bed Additional Information: Pt requiring 1:1 sitter in acute, transition to veil bed per Dr. Riley Kill to allow pt to safely begin rehab program Pt/Family Agrees to Admission and willing to participate: Yes Program Orientation Provided & Reviewed with Pt/Caregiver Including Roles  & Responsibilities: Yes  Decrease burden of Care through IP rehab admission: Specialzed equipment needs and Decrease number of caregivers  Possible need for SNF placement upon discharge: yes, plan for continued rehab, if  needed.   Patient Condition: I have reviewed medical records from [facility], spoken with CSW, and patient and family member. I met with patient at the bedside for inpatient rehabilitation assessment.  Patient will benefit from ongoing PT, OT and SLP, can actively participate in 3 hours of therapy a day 5 days of the week, and can make measurable gains during the admission.  Patient will also benefit from the coordinated team approach during an Inpatient Acute Rehabilitation admission.  The patient will receive intensive therapy as well as Rehabilitation physician, nursing, social worker, and care management interventions.  Due to bladder management, bowel management, safety, skin/wound care, disease management, medication administration, pain management and patient education the patient requires 24 hour a day rehabilitation nursing.  The patient is currently mod to max+2  with mobility and basic ADLs.  Discharge setting and therapy post discharge at skilled nursing facility is anticipated.  Patient has agreed to participate in the Acute Inpatient Rehabilitation Program and will admit today.  Preadmission Screen Completed By:  Stephania Fragmin, PT, DPT 10/05/2018 10:08 AM ______________________________________________________________________   Discussed status with Dr. Riley Kill on 10/05/18  at 10:15 AM  and received approval for admission today.  Admission Coordinator:  Stephania Fragmin, PT, DPT time 10:16 AM Dorna Bloom 10/05/18    Assessment/Plan: Diagnosis: left MCA infarct 1. Does the need for close, 24 hr/day Medical supervision in concert with the patient's rehab needs make it unreasonable for this patient to be served in a less intensive setting? Yes 2. Co-Morbidities requiring supervision/potential complications: hx of right MCA infarct and hydrocephalus/vps, sz disorder 3. Due to bladder management, bowel management, safety, skin/wound care, disease management, medication administration, pain  management and patient education, does the patient require 24 hr/day rehab nursing? Yes 4. Does the patient require coordinated care of a physician, rehab nurse, PT (1-2 hrs/day, 5 days/week), OT (1-2 hrs/day, 5 days/week) and SLP (1-2 hrs/day, 5 days/week) to address physical and functional deficits in the context of the above medical diagnosis(es)? Yes Addressing deficits in the following areas: balance, endurance, locomotion, strength, transferring, bowel/bladder control, bathing, dressing, feeding, grooming, toileting, cognition, speech, swallowing and psychosocial support 5. Can the patient actively  participate in an intensive therapy program of at least 3 hrs of therapy 5 days a week? Yes 6. The potential for patient to make measurable gains while on inpatient rehab is good 7. Anticipated functional outcomes upon discharge from inpatients are: min assist PT, min assist OT, mod assist SLP 8. Estimated rehab length of stay to reach the above functional goals is: 18-21 days 9. Anticipated D/C setting: Home 10. Anticipated post D/C treatments: HH therapy 11. Overall Rehab/Functional Prognosis: good  MD Signature: Ranelle Oyster, MD, [facility] Rehabilitation 10/05/2018

## 2018-10-05 NOTE — Plan of Care (Signed)
°  Problem: Clinical Measurements: Goal: Ability to maintain clinical measurements within normal limits will improve Outcome: Progressing Goal: Will remain free from infection Outcome: Progressing Goal: Diagnostic test results will improve Outcome: Progressing Goal: Respiratory complications will improve Outcome: Progressing Goal: Cardiovascular complication will be avoided Outcome: Progressing   Problem: Activity: Goal: Risk for activity intolerance will decrease Outcome: Progressing   Problem: Nutrition: Goal: Adequate nutrition will be maintained Outcome: Progressing   Problem: Coping: Goal: Level of anxiety will decrease Outcome: Progressing   Problem: Safety: Goal: Ability to remain free from injury will improve Outcome: Progressing   Problem: Skin Integrity: Goal: Risk for impaired skin integrity will decrease Outcome: Progressing   Ival Bible, BSN, RN

## 2018-10-06 ENCOUNTER — Inpatient Hospital Stay (HOSPITAL_COMMUNITY): Payer: 59 | Admitting: Speech Pathology

## 2018-10-06 ENCOUNTER — Inpatient Hospital Stay (HOSPITAL_COMMUNITY): Payer: 59 | Admitting: Occupational Therapy

## 2018-10-06 ENCOUNTER — Inpatient Hospital Stay (HOSPITAL_COMMUNITY): Payer: 59 | Admitting: Physical Therapy

## 2018-10-06 NOTE — Evaluation (Signed)
Speech Language Pathology Assessment and Plan  Patient Details  Name: Patrick Franklin MRN: [id number] Date of Birth: 28-Jun-1970  SLP Diagnosis: Aphasia;Cognitive Impairments  Rehab Potential: Good ELOS: 18-21 days   Today's Date: 10/06/2018 SLP Individual Time: 9811-9147 SLP Individual Time Calculation (min): 55 min   Problem List:  Patient Active Problem List   Diagnosis Date Noted   Left middle cerebral artery stroke ([place]) 10/05/2018   Cerebral embolism with cerebral infarction 10/03/2018   Neurological deficit present 10/01/2018   Thrombocytopenia ([place]) 10/01/2018   Neurogenic bladder 10/01/2018   Medicare annual wellness visit, subsequent 07/23/2018   Spastic hemiplegia affecting nondominant side ([place]) 03/26/2018   Impaired decision making 08/30/2017   Aorto-iliac atherosclerosis ([place]) 08/29/2017   History of renal stone 08/22/2017   Seizure disorder ([place]) 08/09/2017   Hemiparesis affecting left side as late effect of stroke ([place]) 08/09/2017   Essential hypertension 05/15/2017   Urinary retention 05/15/2017   Hyperlipidemia 05/15/2017   Hx of ischemic right MCA stroke 05/15/2017   MDD (major depressive disorder) 05/15/2017   Past Medical History:  Past Medical History:  Diagnosis Date   Asthma    Cardiomegaly 04/13/2016   Depression    DVT (deep venous thrombosis) (HCC)    GERD (gastroesophageal reflux disease)    Hemiplegia and hemiparesis    Left side   Hypertension    Neurogenic bladder    Neurogenic bowel    Neuropathy    Pulmonary embolism ([place]) 01/13/2016   Seizures ([place])    Stroke ([place]) 12/31/2015   left side deficits    Subluxation of shoulder joint 04/13/2016   left   UTI (urinary tract infection)    Past Surgical History:  Past Surgical History:  Procedure Laterality Date   CRANIECTOMY  2017   CRANIOPLASTY  2018   IVC FILTER INSERTION  2017   PEG TUBE PLACEMENT  2017   VENTRICULOPERITONEAL SHUNT   2018    Assessment / Plan / Recommendation Clinical Impression [id number] [name] a 48 year old right-handed male with 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 Rickard Patience well as neurogenic bladder, asthma, hypertension. Per chart review patient lives with sister and family. He does have a personal care attendant. He had been attending adult day program in [place] 2 days/week pre-COVID. Per sister he would ambulate approximately 25 feet can ambulate to the bathroom needed some assistance for stand pivot to the chair. Presented 10/01/2018 with garbled speech left side gaze. Cranial CT scan negative for acute changes. CTA head and neck left mid ICA short segment high-grade stenosis. No large vessel occlusion. COVID negative, valproic acid level 83, ammonia 24, platelets 70-82000, chemistries unremarkable. CT perfusion scan showed no core infarct. A follow-up cranial CT scan completed showing progressive posterior left MCA infarction. Patient was not a candidate for any carotid intervention. Lower extremity Dopplers negative. Echocardiogram with ejection fraction of 60% no source of embolism. EEG negative for seizure there was noted severe diffuse encephalopathy as well as cortical dysfunction in right hemisphere. Neurology consulted currently maintained on low-dose aspirin. Patient remains on Vimpat as well as valproate. Patient initially had also been loaded with Keppra discontinued after EEG negative. Patient currently remains n.p.o. with follow-up speech therapyand diet has since been upgraded to regular.  Therapy evaluations completed and patient was admitted for a comprehensive  rehab program  Cognitive-linguistic evaluation was sub-optimal secondary to patient's participation. Patient required max encouragement to participate in evaluation. Patient's cognitive deficits also affect patient's performance. Patient's speech was 100% intelligible in conversational speech attempts. Patient made no attempts to respond to basic functional 1 step directions. Patient intermittently would respond to basic biographical yes/no questions appropriately. On other instances patient made no attempts to respond to yes/no questions. Patient demonstrated poor attention to task requiring max verbal cues for redirection. Patient was oriented to self only. When provided multiple choice cues patient made no attempts to to identify place or time. Patient also did not respond to confrontation or divergent naming tasks. When attempting breakfast patient declined stating "No, no, no your mom is brining me breakfast."  Patient would benefit from skilled SLP services during CIR stay to increase functional independence upon discharge.   Skilled Therapeutic Interventions          Cognitive-linguistic evaluation  SLP Assessment  Patient will need skilled [facility] during CIR admission    Recommendations  Recommendations for Other Services: Neuropsych consult Patient destination: Home Follow up Recommendations: 24 hour supervision/assistance;Home Health SLP Equipment Recommended: None recommended by SLP    SLP Frequency 3 to 5 out of 7 days   SLP Duration  SLP Intensity  SLP Treatment/Interventions 18-21 days  Minumum of 1-2 x/day, 30 to 90 minutes  Cognitive remediation/compensation;Speech/Language facilitation;Therapeutic Activities;Therapeutic Exercise;Internal/external aids;Patient/family education;Cueing hierarchy;Functional tasks    Pain Pain Assessment Pain Scale: 0-10 Pain Score: 0-No pain  Prior Functioning Cognitive/Linguistic Baseline: Baseline  deficits Type of Home: House  Lives With: (Sister) Available Help at Discharge: Family;Available 24 hours/day;Personal care attendant  Short Term Goals: Week 1: SLP Short Term Goal 1 (Week 1): Patient will participate in further assesssment of expressive/receptive language skills. SLP Short Term Goal 2 (Week 1): Patient will attend to basic, familiar task for 5 minutes with max verbal cues for redirection. SLP Short Term Goal 3 (Week 1): Patient will complete basic, familiar tasks with max a verbal cues for functional problem sovling.  Refer to Care Plan for Long Term Goals  Recommendations for other services: Neuropsych  Discharge Criteria: Patient will be discharged from SLP if patient refuses treatment 3 consecutive times without medical reason, if treatment goals not met, if there is a change in medical status, if patient makes no progress towards goals or if patient is discharged from hospital.  The above assessment, treatment plan, treatment alternatives and goals were discussed and mutually agreed upon: No family available/patient unable  Cristy Folks, [place] 10/06/2018, 8:01 AM

## 2018-10-06 NOTE — Progress Notes (Signed)
[facility] PHYSICAL MEDICINE & REHABILITATION PROGRESS NOTE   Subjective/Complaints:  Patient in enclosure bed.  No signs of agitation.  He awakens to voice.  His speech is without dysarthria.  He does not respond appropriately to questions.  He asks inappropriate questions such as did you see Barbara CowerJason on the surfboard? Review of systems unable to obtain secondary to cognition Objective:   No results found. Recent Labs    10/04/18 0639  WBC 3.0*  HGB 13.9  HCT 41.2  PLT 68*   Recent Labs    10/04/18 0639  NA 142  K 4.1  CL 108  CO2 25  GLUCOSE 89  BUN 7  CREATININE 0.88  CALCIUM 8.5*    Intake/Output Summary (Last 24 hours) at 10/06/2018 1333 Last data filed at 10/06/2018 0937 Gross per 24 hour  Intake 100 ml  Output 1100 ml  Net -1000 ml     Physical Exam: Vital Signs Blood pressure (!) 136/100, pulse 82, temperature 98.8 F (37.1 C), temperature source Axillary, resp. rate 14, SpO2 96 %.  General: No acute distress Mood and affect are appropriate Heart: Regular rate and rhythm no rubs murmurs or extra sounds Lungs: Clear to auscultation, breathing unlabored, no rales or wheezes Abdomen: Positive bowel sounds, soft nontender to palpation, nondistended Extremities: No clubbing, cyanosis, or edema Skin: No evidence of breakdown, no evidence of rash Neurologic: Cranial nerves II through XII intact, motor strength is 5/5 in bilateral deltoid, bicep, tricep, grip, hip flexor, knee extensors, ankle dorsiflexor and plantar flexor Sensory exam normal sensation to light touch and proprioception in bilateral upper and lower extremities Cerebellar exam normal finger to nose to finger as well as heel to shin in bilateral upper and lower extremities Musculoskeletal: Full range of motion in all 4 extremities. No joint swelling    Assessment/Plan: 1. Functional deficits secondary to new posterior left MCA distribution infarct in the setting of prior right MCA infarct with  chronic left hemiparesis which require 3+ hours per day of interdisciplinary therapy in a comprehensive inpatient rehab setting.  Physiatrist is providing close team supervision and 24 hour management of active medical problems listed below.  Physiatrist and rehab team continue to assess barriers to discharge/monitor patient progress toward functional and medical goals  Care Tool:  Bathing        Body parts bathed by helper: Right arm, Left arm, Chest, Abdomen, Buttocks, Right upper leg, Left upper leg, Right lower leg, Left lower leg, Face Body parts n/a: Front perineal area   Bathing assist Assist Level: 2 Helpers     Upper Body Dressing/Undressing Upper body dressing   What is the patient wearing?: Pull over shirt    Upper body assist Assist Level: Maximal Assistance - Patient 25 - 49%    Lower Body Dressing/Undressing Lower body dressing      What is the patient wearing?: Incontinence brief, Pants     Lower body assist Assist for lower body dressing: 2 Helpers     Toileting Toileting    Toileting assist Assist for toileting: Total Assistance - Patient < 25%(using Stedy)     Transfers Chair/bed transfer  Transfers assist           Locomotion Ambulation   Ambulation assist              Walk 10 feet activity   Assist           Walk 50 feet activity   Assist  Walk 150 feet activity   Assist           Walk 10 feet on uneven surface  activity   Assist           Wheelchair     Assist               Wheelchair 50 feet with 2 turns activity    Assist            Wheelchair 150 feet activity     Assist          Blood pressure (!) 136/100, pulse 82, temperature 98.8 F (37.1 C), temperature source Axillary, resp. rate 14, SpO2 96 %.  Medical Problem List and Plan: 1.Slurred speech left side gazesecondary to left MCA infarctionWernicke's area secondary to left mid ICA short  segment high-grade stenosis. Patient has hx ofprior right MCA infarction as well as VP shunt for hydrocephalus CIR PT, OT, SLP evals -enclosure bed for safety 2. Antithrombotics: -DVT/anticoagulation:SCDs. Venous Dopplers negative -antiplatelet therapy: Aspirin 81 mg daily 3. Pain Management:Tylenol as needed 4. Mood:Provide emotional support -antipsychotic agents: N/A 5. Neuropsych: This patientis notcapable of making decisions on hisown behalf. 6. Skin/Wound Care:Routine skin checks 7. Fluids/Electrolytes/Nutrition:Routine in and outs with follow-up chemistrieson admit. 8. Seizure disorder. Vimpat 100 mg twice daily, Depakote 1000 mg twice daily. EEG negative, no evidence of seizure since admitted to rehab 9. Dysphagia. Follow-up speech therapy. Diet has been advanced to regular as of 10/04/2018.Will need supervision +/- encouragement potentially to eat. 10. Hypertension. Permissive hypertension. Patient on metoprolol 25 mg twice daily prior to admission. Resume as needed Vitals:   10/05/18 2000  BP: (!) 136/100  Pulse: 82  Resp: 14  Temp: 98.8 F (37.1 C)  SpO2: 96%  The patient is 5 days post infarct will wait until next week to tighten control 11. Hyperlipidemia. Lipitor 80 mg daily 12. Neurogenic bladder. Check PVRs and voiding patterns. 13. Chronic thrombocytopenia. Stable    LOS: 1 days A FACE TO FACE EVALUATION WAS PERFORMED  Charlett Blake 10/06/2018, 1:33 PM

## 2018-10-06 NOTE — Evaluation (Signed)
Physical Therapy Assessment and Plan  Patient Details  Name: Patrick Franklin MRN: [id number] Date of Birth: 07-26-1970  PT Diagnosis: Ataxia, Ataxic gait, Cognitive deficits, Hemiplegia non-dominant, Impaired cognition, Impaired sensation and Muscle weakness Rehab Potential: Fair ELOS: 12 to 14 days   Today's Date: 10/06/2018 PT Individual Time: 1301-1401 PT Individual Time Calculation (min): 60 min    Problem List:  Patient Active Problem List   Diagnosis Date Noted   Left middle cerebral artery stroke ([place]) 10/05/2018   Cerebral embolism with cerebral infarction 10/03/2018   Neurological deficit present 10/01/2018   Thrombocytopenia ([place]) 10/01/2018   Neurogenic bladder 10/01/2018   Medicare annual wellness visit, subsequent 07/23/2018   Spastic hemiplegia affecting nondominant side ([place]) 03/26/2018   Impaired decision making 08/30/2017   Aorto-iliac atherosclerosis ([place]) 08/29/2017   History of renal stone 08/22/2017   Seizure disorder ([place]) 08/09/2017   Hemiparesis affecting left side as late effect of stroke ([place]) 08/09/2017   Essential hypertension 05/15/2017   Urinary retention 05/15/2017   Hyperlipidemia 05/15/2017   Hx of ischemic right MCA stroke 05/15/2017   MDD (major depressive disorder) 05/15/2017    Past Medical History:  Past Medical History:  Diagnosis Date   Asthma    Cardiomegaly 04/13/2016   Depression    DVT (deep venous thrombosis) (HCC)    GERD (gastroesophageal reflux disease)    Hemiplegia and hemiparesis    Left side   Hypertension    Neurogenic bladder    Neurogenic bowel    Neuropathy    Pulmonary embolism ([place]) 01/13/2016   Seizures ([place])    Stroke ([place]) 12/31/2015   left side deficits    Subluxation of shoulder joint 04/13/2016   left   UTI (urinary tract infection)    Past Surgical History:  Past Surgical History:  Procedure Laterality Date   CRANIECTOMY  2017   CRANIOPLASTY  2018   IVC  FILTER INSERTION  2017   PEG TUBE PLACEMENT  2017   VENTRICULOPERITONEAL SHUNT  2018    Assessment & Plan Clinical Impression: Patient is a 48 year old right-handed male with history of large right MCA infarction with large right cerebral encephalomalacia, hydrocephalus status post VP shunt at [facility] in [place] with 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 Rickard Patience well as neurogenic bladder, asthma, hypertension. Per chart review patient lives with sister and family. He does have a personal care attendant. He had been attending adult day program in [place] 2 days/week pre-COVID. Per sister he would ambulate approximately 25 feet can ambulate to the bathroom needed some assistance for stand pivot to the chair. Presented 10/01/2018 with garbled speech left side gaze. Cranial CT scan negative for acute changes. CTA head and neck left mid ICA short segment high-grade stenosis. No large vessel occlusion. COVID negative, valproic acid level 83, ammonia 24, platelets 70-82000, chemistries unremarkable. CT perfusion scan showed no core infarct. A follow-up cranial CT scan completed showing progressive posterior left MCA infarction. Patient was not a candidate for any carotid intervention. Lower extremity Dopplers negative. Echocardiogram with ejection fraction of 60% no source of embolism. EEG negative for seizure there was noted severe diffuse encephalopathy as well as cortical dysfunction in right hemisphere. Neurology consulted currently maintained on low-dose aspirin. Patient remains on Vimpat as well as valproate. Patient initially had also been loaded with Keppra discontinued after EEG negative. Patient currently remains  n.p.o. with follow-up speech therapyand diet has since been upgraded to regular.   Patient transferred to  CIR on 10/05/2018 .   Patient currently requires max with mobility secondary to muscle weakness and muscle paralysis, decreased cardiorespiratoy endurance, abnormal tone, ataxia and decreased coordination, decreased attention to left and decreased awareness, decreased problem solving, decreased safety awareness and decreased memory.  Prior to hospitalization, patient was mod with mobility and lived with Family in a House home.  Home access is   .  Patient will benefit from skilled PT intervention to maximize safe functional mobility, minimize fall risk and decrease caregiver burden for planned discharge SNF.  Anticipate patient will SNF PT at discharge.  PT - End of Session Activity Tolerance: Tolerates 30+ min activity with multiple rests PT Assessment Rehab Potential (ACUTE/IP ONLY): Fair PT Barriers to Discharge: Inaccessible home environment;Decreased caregiver support PT Patient demonstrates impairments in the following area(s): Balance;Motor;Safety;Perception PT Transfers Functional Problem(s): Bed Mobility;Bed to Chair;Car PT Locomotion Functional Problem(s): Ambulation PT Plan PT Intensity: Minimum of 1-2 x/day ,45 to 90 minutes PT Frequency: 5 out of 7 days PT Duration Estimated Length of Stay: 12 to 14 days PT Treatment/Interventions: Ambulation/gait training;Balance/vestibular training;Disease management/prevention;Discharge planning;Community reintegration;DME/adaptive equipment instruction;Functional mobility training;Patient/family education;Pain management;Neuromuscular re-education;Splinting/orthotics;Therapeutic Exercise;Therapeutic Activities;Stair training;UE/LE Strength taining/ROM;UE/LE Coordination activities;Wheelchair propulsion/positioning PT Transfers Anticipated Outcome(s): min A transfers PT Locomotion Anticipated Outcome(s): mod A gait PT Recommendation Follow Up Recommendations: Skilled nursing facility Patient destination: [facility] (SNF) Equipment  Recommended: To be determined  Skilled Therapeutic Intervention PT was seen bedside in the pm. PT evaluation completed and treatment plan initiated. Pt performed multiple transfers with mod to max A and verbal cues. Pt has difficulty following commands, does better automatic commands. Pt also has global aphasia, receptive greater than expressive. Pt also demonstrated tangential conversation throughout evaluation and treatment required frequent redirection. Pt is impulsive and will abruptly sit during transfers and ambulation. At end of treatment pt sitting up in inclosure bed with call bell within reach.   PT Evaluation Precautions/Restrictions Precautions Precautions: Fall Precaution Comments: Lt hemi + Lt hemianopsia at baseline Restrictions Weight Bearing Restrictions: No General Chart Reviewed: Yes PT Amount of Missed Time (min): 15 Minutes PT Missed Treatment Reason: Patient fatigue Family/Caregiver Present: No Vital Signs Pain Pain Assessment Pain Score: 0-No pain Home Living/Prior Functioning Home Living Available Help at Discharge: [facility] Type of Home: House  Lives With: Family Prior Function Level of Independence: Needs assistance with tranfers;Needs assistance with gait Driving: No Vision/Perception  Perception Perception: Impaired Inattention/Neglect: Does not attend to left side of body;Does not attend to left visual field Cognition Overall Cognitive Status: Impaired/Different from baseline Arousal/Alertness: Awake/alert Orientation Level: Oriented to person Sustained Attention: Impaired Memory: Impaired Memory Impairment: Decreased recall of new information Awareness: Impaired Problem Solving: Impaired Decision Making: Impaired Behaviors: Lability Safety/Judgment: Impaired Sensation Sensation Light Touch: Impaired by gross assessment Light Touch Impaired Details: Impaired LLE Coordination Gross Motor Movements are Fluid and Coordinated:  No Fine Motor Movements are Fluid and Coordinated: No Coordination and Movement Description: Affected by Lt hemiparesis Finger Nose Finger Test: Unable to complete with the Lt Motor  Motor Motor: Abnormal tone;Abnormal postural alignment and control;Ataxia Motor - Skilled Clinical Observations: Lt hemiplegia, UE>LE  Mobility Bed Mobility Bed Mobility: Supine to Sit;Sit to Supine Supine to Sit: Maximal Assistance - Patient - Patient 25-49% Sit to Supine: Total Assistance - Patient < 25% Transfers Transfers: Sit to Stand;Stand to Sit;Stand Pivot Transfers Sit to Stand: Moderate Assistance -  Patient 50-74% Stand to Sit: Moderate Assistance - Patient 50-74% Stand Pivot Transfers: Moderate Assistance - Patient 50 - 74%;Maximal Assistance - Patient 25 - 49% Transfer (Assistive device): None Locomotion  Gait Ambulation: Yes Gait Assistance: Maximal Assistance - Patient 25-49% Gait Distance (Feet): 3 Feet Assistive device: None Stairs / Additional Locomotion Stairs: No Wheelchair Mobility Wheelchair Mobility: No  Trunk/Postural Assessment  Cervical Assessment Cervical Assessment: Exceptions to [facility] Thoracic Assessment Thoracic Assessment: Exceptions to [facility] Lumbar Assessment Lumbar Assessment: Exceptions to [facility] Postural Control Postural Control: Deficits on evaluation  Balance Balance Balance Assessed: Yes Static Sitting Balance Static Sitting - Balance Support: Feet supported Static Sitting - Level of Assistance: 5: Stand by assistance Dynamic Sitting Balance Sitting balance - Comments: Mod A for sitting balance when donning overhead shirt, Max A for standing balance when assisting with pulling pants over hips Static Standing Balance Static Standing - Balance Support: During functional activity Static Standing - Level of Assistance: 2: Max assist Dynamic Standing Balance Dynamic Standing - Balance Support: During functional activity Dynamic Standing - Level of Assistance: 2:  Max assist Extremity Assessment B UEs as OT.  RLE Assessment Passive Range of Motion (PROM) Comments: WFLs General Strength Comments: unable to formally assess, decreased LE strength LLE Assessment LLE Assessment: Exceptions to [facility] Passive Range of Motion (PROM) Comments: WFLs except DF to neutral General Strength Comments: impaired    Refer to Care Plan for Long Term Goals  Recommendations for other services: None   Discharge Criteria: Patient will be discharged from PT if patient refuses treatment 3 consecutive times without medical reason, if treatment goals not met, if there is a change in medical status, if patient makes no progress towards goals or if patient is discharged from hospital.  The above assessment, treatment plan, treatment alternatives and goals were discussed and mutually agreed upon: by patient  Dub Amis 10/06/2018, 3:43 PM

## 2018-10-06 NOTE — Evaluation (Signed)
Occupational Therapy Assessment and Plan  Patient Details  Name: Patrick Franklin MRN: [id number] Date of Birth: 04/26/1970  OT Diagnosis: abnormal posture, ataxia, cognitive deficits, hemiplegia affecting non-dominant side and muscle weakness (generalized) Rehab Potential: Rehab Potential (ACUTE ONLY): Poor ELOS: 12-14 days   Today's Date: 10/06/2018 OT Individual Time: 1517-6160 OT Individual Time Calculation (min): 60 min     Problem List:  Patient Active Problem List   Diagnosis Date Noted   Left middle cerebral artery stroke ([place]) 10/05/2018   Cerebral embolism with cerebral infarction 10/03/2018   Neurological deficit present 10/01/2018   Thrombocytopenia ([place]) 10/01/2018   Neurogenic bladder 10/01/2018   Medicare annual wellness visit, subsequent 07/23/2018   Spastic hemiplegia affecting nondominant side ([place]) 03/26/2018   Impaired decision making 08/30/2017   Aorto-iliac atherosclerosis ([place]) 08/29/2017   History of renal stone 08/22/2017   Seizure disorder ([place]) 08/09/2017   Hemiparesis affecting left side as late effect of stroke ([place]) 08/09/2017   Essential hypertension 05/15/2017   Urinary retention 05/15/2017   Hyperlipidemia 05/15/2017   Hx of ischemic right MCA stroke 05/15/2017   MDD (major depressive disorder) 05/15/2017    Past Medical History:  Past Medical History:  Diagnosis Date   Asthma    Cardiomegaly 04/13/2016   Depression    DVT (deep venous thrombosis) (HCC)    GERD (gastroesophageal reflux disease)    Hemiplegia and hemiparesis    Left side   Hypertension    Neurogenic bladder    Neurogenic bowel    Neuropathy    Pulmonary embolism ([place]) 01/13/2016   Seizures ([place])    Stroke ([place]) 12/31/2015   left side deficits    Subluxation of shoulder joint 04/13/2016   left   UTI (urinary tract infection)    Past Surgical History:  Past Surgical History:  Procedure Laterality Date   CRANIECTOMY  2017    CRANIOPLASTY  2018   IVC FILTER INSERTION  2017   PEG TUBE PLACEMENT  2017   VENTRICULOPERITONEAL SHUNT  2018    Assessment & Plan Clinical Impression:   Patrick Franklin a 48 year old right-handed male with history of large right MCA infarction with large right cerebral encephalomalacia, hydrocephalus status post VP shunt at [facility] in [place] with 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 Rickard Patience well as neurogenic bladder, asthma, hypertension. Per chart review patient lives with sister and family. He does have a personal care attendant. He had been attending adult day program in [place] 2 days/week pre-COVID. Per sister he would ambulate approximately 25 feet can ambulate to the bathroom needed some assistance for stand pivot to the chair. Presented 10/01/2018 with garbled speech left side gaze. Cranial CT scan negative for acute changes. CTA head and neck left mid ICA short segment high-grade stenosis. No large vessel occlusion. COVID negative, valproic acid level 83, ammonia 24, platelets 70-82000, chemistries unremarkable. CT perfusion scan showed no core infarct. A follow-up cranial CT scan completed showing progressive posterior left MCA infarction. Patient was not a candidate for any carotid intervention. Lower extremity Dopplers negative. Echocardiogram with ejection fraction of 60% no source of embolism. EEG negative for seizure there was noted severe diffuse encephalopathy as well as cortical dysfunction in right hemisphere. Neurology consulted currently maintained on low-dose aspirin. Patient remains on Vimpat as well as valproate. Patient initially had also been loaded with Keppra discontinued after EEG  negative. Patient currently remains n.p.o. with follow-up speech therapyand diet has since been upgraded  to regular. Therapy evaluations completed and patient was admitted for a comprehensive rehab program  Patient currently requires total with basic self-care skills secondary to muscle weakness, muscle joint tightness and muscle paralysis, decreased cardiorespiratoy endurance, abnormal tone, unbalanced muscle activation, decreased coordination and decreased motor planning, decreased visual acuity, decreased visual perceptual skills, field cut and hemianopsia, decreased attention to left and left side neglect, decreased initiation, decreased attention, decreased awareness, decreased problem solving and decreased safety awareness and decreased sitting balance, decreased standing balance, decreased postural control and hemiplegia.  Prior to hospitalization, patient could complete BADLs with max.  Patient will benefit from skilled intervention to increase independence with basic self-care skills prior to discharge SNF.   OT Assessment Rehab Potential (ACUTE ONLY): Poor OT Barriers to Discharge: Incontinence;Neurogenic Bowel & Bladder;Behavior OT Patient demonstrates impairments in the following area(s): Balance;Behavior;Cognition;Endurance;Motor;Perception;Safety;Sensory;Vision OT Basic ADL's Functional Problem(s): Eating;Grooming;Bathing;Dressing;Toileting OT Advanced ADL's Functional Problem(s): Simple Meal Preparation OT Transfers Functional Problem(s): Toilet OT Additional Impairment(s): Fuctional Use of Upper Extremity OT Plan OT Intensity: Minimum of 1-2 x/day, 45 to 90 minutes OT Frequency: 5 out of 7 days OT Duration/Estimated Length of Stay: 12-14 days OT Treatment/Interventions: Balance/vestibular training;DME/adaptive equipment instruction;Patient/family education;Therapeutic Activities;Wheelchair propulsion/positioning;Therapeutic Exercise;Psychosocial support;Functional electrical stimulation;Cognitive remediation/compensation;Community reintegration;Functional mobility training;Self  Care/advanced ADL retraining;UE/LE Strength taining/ROM;UE/LE Coordination activities;Neuromuscular re-education;Discharge planning;Disease mangement/prevention;Pain management;Splinting/orthotics;Visual/perceptual remediation/compensation OT Self Feeding Anticipated Outcome(s): Mod A OT Basic Self-Care Anticipated Outcome(s): Mod A UB self care, no goals for LB OT Toileting Anticipated Outcome(s): No goal OT Bathroom Transfers Anticipated Outcome(s): Mod A OT Recommendation Patient destination: [facility] (SNF) Follow Up Recommendations: Skilled nursing facility Skilled Therapeutic Intervention Skilled OT session completed with focus on initial evaluation, education on OT role/POC, and establishment of patient-centered goals.   Pts sister Almyra Free was present at start of session. Very helpful with redirecting pt enough to participate in tx. Pt with nonsensical speech throughout and unable to follow 1 step instruction 75% of the time. This improved near the end of session. He was initially resistant when OT facilitated HOH to wash face or attempt to get OOB. Had sister facilitate transition to EOB which worked out well. Steady assist progressing to Mod A for sitting balance due to fatigue during self care tasks. He completed UB/LB dressing with Total A overall. Max A for sit<stand and for standing balance while sister assisted with ADL task (I.e. perihygiene or pulling up pants). Total A for footwear, including Lt AFO which sister reports he always needs to have for transfers. Noted visual deficits, Lt inattention, nonfunctional hypertonic L UE. Per sister, a lot of this was baseline, but she believes vision has gotten worse with current CVA. Difficult to assess due to cognition. Mod-Max A stand pivot transfer to the recliner. Pt began pulling at his safety belt and gesturing towards bathroom, stating he needed to "sit down a moment." Max A stand pivot to the [facility]. Pt audibly voiding bowels once  pants were lowered. Left pt in care of NT with sister still present. Provided NT with Stedy to use during toileting per OT recommendation via safety plan.    Sister confirmed that the plan is for pt to d/c SNF post CIR stay  OT Evaluation Precautions/Restrictions  Precautions Precaution Comments: Lt hemi + Lt hemianopsia at baseline General Chart Reviewed: Yes Family/Caregiver Present: Yes(sister Almyra Free) Pain: No s/s pain during session    Home Living/Prior [place] expects to  be discharged to:: Skilled nursing facility Living Arrangements: Other relatives(sister) Available Help at Discharge: [place] Type of Home: House  Lives With: Family IADL History Homemaking Responsibilities: No Occupation: Unemployed Leisure and Hobbies: Attended a day program in [place], went on trips with family to state parks Prior Function Level of Independence: Needs assistance with ADLs Bath: Maximal Toileting: Maximal Dressing: Maximal Grooming: Maximal Feeding: Minimal Driving: No ADL ADL Eating: Not assessed Grooming: Moderate assistance(combing hair) Where Assessed-Grooming: Chair Upper Body Bathing: Maximal assistance Where Assessed-Upper Body Bathing: Edge of bed Lower Body Bathing: Dependent(2 assist) Where Assessed-Lower Body Bathing: Edge of bed Upper Body Dressing: Maximal assistance Where Assessed-Upper Body Dressing: Edge of bed Lower Body Dressing: Dependent(2 assist) Where Assessed-Lower Body Dressing: Edge of bed Toileting: Dependent(using Stedy) Where Assessed-Toileting: Bedside Commode Toilet Transfer: Maximal assistance Toilet Transfer Method: Stand pivot Science writer: Engineer, technical sales Transfer: Not assessed Vision Baseline Vision/History: Wears glasses Patient Visual Report: Other (comment);Peripheral vision impairment(Lt hemianopsia at baseline) Vision Assessment?: Vision impaired- to be  further tested in functional context Perception  Perception: Impaired Inattention/Neglect: Does not attend to left visual field;Does not attend to left side of body Praxis Praxis: Impaired Praxis Impairment Details: Initiation;Ideation;Ideomotor;Motor planning Cognition Overall Cognitive Status: Impaired/Different from baseline Arousal/Alertness: Lethargic Orientation Level: Person Year: (unable to provide sensible answer due to aphasia) Month: (unable to provide sensible answer due to aphasia) Day of Week: (unable to provide sensible answer due to aphasia) Memory: Impaired Immediate Memory Recall: (N/A due to aphasia) Memory Recall Sock: Not able to recall Memory Recall Blue: Not able to recall Memory Recall Bed: Not able to recall Attention: Sustained Sustained Attention: Impaired Awareness: Impaired Problem Solving: Impaired Behaviors: Impulsive;Restless Safety/Judgment: Impaired Sensation Sensation Light Touch: Impaired Detail Light Touch Impaired Details: Impaired LLE;Impaired LUE Coordination Gross Motor Movements are Fluid and Coordinated: No Fine Motor Movements are Fluid and Coordinated: No Coordination and Movement Description: Affected by Lt hemiparesis Finger Nose Finger Test: Unable to complete with the Lt Motor  Motor Motor: Hemiplegia;Abnormal tone;Abnormal postural alignment and control Motor - Skilled Clinical Observations: Lt hemiplegia, UE>LE Mobility    Max A stand pivot BSC transfer Trunk/Postural Assessment  Cervical Assessment Cervical Assessment: (forward head) Thoracic Assessment Thoracic Assessment: Exceptions to WFL(rounded shoulders + kyphosis) Lumbar Assessment Lumbar Assessment: (posterior pelvic tilt) Postural Control Postural Control: Deficits on evaluation(posterior bias in standing, Rt lean in sitting)  Balance Balance Balance Assessed: Yes Dynamic Sitting Balance Sitting balance - Comments: Mod A for sitting balance when donning  overhead shirt, Max A for standing balance when assisting with pulling pants over hips Extremity/Trunk Assessment RUE Assessment RUE Assessment: Within Functional Limits LUE Assessment LUE Assessment: Exceptions to WFL(Hypertonic throughout arm, able to tolerate full extension of elbow for hygiene. Per family, this is baseline) General Comments: Shoulder subluxation 1 finger width. Per sister, he uses a brace to address sublux at home and she plans to bring it to CIR   Refer to Care Plan for Long Term Goals  Recommendations for other services: None    Discharge Criteria: Patient will be discharged from OT if patient refuses treatment 3 consecutive times without medical reason, if treatment goals not met, if there is a change in medical status, if patient makes no progress towards goals or if patient is discharged from hospital.  The above assessment, treatment plan, treatment alternatives and goals were discussed and mutually agreed upon: by patient and by family  Skeet Simmer 10/06/2018, 12:46 PM

## 2018-10-07 ENCOUNTER — Inpatient Hospital Stay (HOSPITAL_COMMUNITY): Payer: 59 | Admitting: Occupational Therapy

## 2018-10-07 NOTE — Progress Notes (Signed)
[facility] PHYSICAL MEDICINE & REHABILITATION PROGRESS NOTE   Subjective/Complaints:  His sister is visiting.  She is the primary caretaker.  Both her and her husband to work but they do have a hired Engineer, structuralcaregiver.  He used to go to adult daycare prior to COVID.  [place]. Review of systems unable to obtain secondary to cognition Objective:   No results found. No results for input(s): WBC, HGB, HCT, PLT in the last 72 hours. No results for input(s): NA, K, CL, CO2, GLUCOSE, BUN, CREATININE, CALCIUM in the last 72 hours.  Intake/Output Summary (Last 24 hours) at 10/07/2018 1233 Last data filed at 10/07/2018 1212 Gross per 24 hour  Intake 270 ml  Output 200 ml  Net 70 ml     Physical Exam: Vital Signs Blood pressure (!) 136/100, pulse 82, temperature 98.8 F (37.1 C), temperature source Axillary, resp. rate 14, SpO2 96 %.  General: No acute distress Mood and affect are appropriate Heart: Regular rate and rhythm no rubs murmurs or extra sounds Lungs: Clear to auscultation, breathing unlabored, no rales or wheezes Abdomen: Positive bowel sounds, soft nontender to palpation, nondistended Extremities: No clubbing, cyanosis, or edema Skin: No evidence of breakdown, no evidence of rash Neurologic: Cranial nerves II through XII intact, motor strength is 5/5 in bilateral deltoid, bicep, tricep, grip, hip flexor, knee extensors, ankle dorsiflexor and plantar flexor Sensory exam normal sensation to light touch and proprioception in bilateral upper and lower extremities Cerebellar exam normal finger to nose to finger as well as heel to shin in bilateral upper and lower extremities Musculoskeletal: Full range of motion in all 4 extremities. No joint swelling    Assessment/Plan: 1. Functional deficits secondary to new posterior left MCA distribution infarct in the setting of prior right MCA infarct with chronic left hemiparesis which require 3+ hours per day of  interdisciplinary therapy in a comprehensive inpatient rehab setting.  Physiatrist is providing close team supervision and 24 hour management of active medical problems listed below.  Physiatrist and rehab team continue to assess barriers to discharge/monitor patient progress toward functional and medical goals  Care Tool:  Bathing        Body parts bathed by helper: Right arm, Left arm, Chest, Abdomen, Buttocks, Right upper leg, Left upper leg, Right lower leg, Left lower leg, Face Body parts n/a: Front perineal area   Bathing assist Assist Level: 2 Helpers     Upper Body Dressing/Undressing Upper body dressing   What is the patient wearing?: Pull over shirt    Upper body assist Assist Level: Maximal Assistance - Patient 25 - 49%    Lower Body Dressing/Undressing Lower body dressing      What is the patient wearing?: Incontinence brief     Lower body assist Assist for lower body dressing: Maximal Assistance - Patient 25 - 49%     Toileting Toileting    Toileting assist Assist for toileting: Maximal Assistance - Patient 25 - 49%(BSC, urinal )     Transfers Chair/bed transfer  Transfers assist     Chair/bed transfer assist level: Maximal Assistance - Patient 25 - 49%     Locomotion Ambulation   Ambulation assist      Assist level: Maximal Assistance - Patient 25 - 49% Assistive device: No Device Max distance: 3   Walk 10 feet activity   Assist  Walk 10 feet activity did not occur: Safety/medical concerns        Walk 50 feet activity  Assist Walk 50 feet with 2 turns activity did not occur: Safety/medical concerns         Walk 150 feet activity   Assist Walk 150 feet activity did not occur: Safety/medical concerns         Walk 10 feet on uneven surface  activity   Assist Walk 10 feet on uneven surfaces activity did not occur: Safety/medical concerns         Wheelchair     Assist Will patient use wheelchair at  discharge?: Yes Type of Wheelchair: Manual Wheelchair activity did not occur: Safety/medical concerns         Wheelchair 50 feet with 2 turns activity    Assist    Wheelchair 50 feet with 2 turns activity did not occur: Safety/medical concerns       Wheelchair 150 feet activity     Assist  Wheelchair 150 feet activity did not occur: Safety/medical concerns       Blood pressure (!) 136/100, pulse 82, temperature 98.8 F (37.1 C), temperature source Axillary, resp. rate 14, SpO2 96 %.  Medical Problem List and Plan: 1.Slurred speech left side gazesecondary to left MCA infarctionWernicke's area secondary to left mid ICA short segment high-grade stenosis on 10/01/2018. Patient has hx ofprior right MCA infarction as well as VP shunt for hydrocephalus CIR PT, OT, SLP evals -enclosure bed for safety, previous level of functioning required min assist with a gait belt for ambulation short distances but because of some worsening of balance family has used transfer chair more frequently 2. Antithrombotics: -DVT/anticoagulation:SCDs. Venous Dopplers negative -antiplatelet therapy: Aspirin 81 mg daily 3. Pain Management:Tylenol as needed 4. Mood:Provide emotional support -antipsychotic agents: N/A 5. Neuropsych: This patientis notcapable of making decisions on hisown behalf. 6. Skin/Wound Care:Routine skin checks 7. Fluids/Electrolytes/Nutrition:Routine in and outs with follow-up chemistrieson admit. 8. Seizure disorder. Vimpat 100 mg twice daily, Depakote 1000 mg twice daily. EEG negative, no evidence of seizure since admitted to rehab 9. Dysphagia. Follow-up speech therapy. Diet has been advanced to regular as of 10/04/2018.Will need supervision +/- encouragement potentially to eat. 10. Hypertension. Permissive hypertension. Patient on metoprolol 25 mg twice daily prior to admission. Resume as  needed Vitals:   10/05/18 2000  BP: (!) 136/100  Pulse: 82  Resp: 14  Temp: 98.8 F (37.1 C)  SpO2: 96%  The patient is 6 days post infarct will wait until next week to tighten control 11. Hyperlipidemia. Lipitor 80 mg daily 12. Neurogenic bladder. Check PVRs and voiding patterns. 13. Chronic thrombocytopenia. Stable    LOS: 2 days A FACE TO FACE EVALUATION WAS PERFORMED  Charlett Blake 10/07/2018, 12:33 PM

## 2018-10-07 NOTE — Progress Notes (Signed)
Occupational Therapy Session Note  Patient Details  Name: Patrick Franklin MRN: [id number] Date of Birth: 05-29-1970  Today's Date: 10/07/2018 OT Individual Time: 0347-4259 OT Individual Time Calculation (min): 58 min   Short Term Goals: Week 1:  OT Short Term Goal 1 (Week 1): Pt will maintain sustained attention to 1 ADL task for 30 seconds with mod vcs OT Short Term Goal 2 (Week 1): Pt will sit EOB/EOM for 20 minutes during functional activity with no more than Min A for sitting balance  Skilled Therapeutic Interventions/Progress Updates:    Pt greeted in enclosure bed, appearing alert and calm. Asking OT if he was at home. Oriented pt to place and situation but he was unable to repeat information back to OT. Supine<sit completed with Mod A for Lt side. Min A for opening containers before eating breakfast. Worked on sustained attention to task and activity tolerance at this time. Pt frequently closing eyes, remaining idle for >30 seconds when tactile cuing was not given to continue eating. Noted clonus/jerking activity of R LE at rest. Pt initially shook his head when asked if he wanted his food warmed up, but while he was eating, grimaced, stating: "it's cold." Had food warmed up during session however due to the time it took to eat, food cooled off again and he shook his head when encouraged to eat more. Sister Almyra Free arrived during session and pt amenable to eat some pickles. He ate 20-25% of meal overall in ~45 minutes with supervision for sitting balance. Pt requested to return to bed due to fatigue after. Mod A for sit<supine and then he was boosted up with 2 assist. Pt then reported urgent need to void bladder. Mod A for supine<sit to use urinal. Assist required for urinal placement. After several minutes of trying to use the urinal, pt still unable to void bladder despite environmental modifications. Pt left in care of NT, still trying to use the urinal.   During tx, pt exhibited  inappropriate sexual behaviors, such as stroking OT's arm and puckering lips, asking OT to get into bed with him. Pt continued to ask "Aren't you my Kathlee Nations?" He was easily redirected to task, though continued to call OT Kathlee Nations throughout session.   Therapy Documentation Precautions:  Precautions Precautions: Fall Precaution Comments: Lt hemi + Lt hemianopsia at baseline Restrictions Weight Bearing Restrictions: No Pain: No s/s pain during tx    ADL: ADL Eating: Not assessed Grooming: Moderate assistance(combing hair) Where Assessed-Grooming: Chair Upper Body Bathing: Maximal assistance Where Assessed-Upper Body Bathing: Edge of bed Lower Body Bathing: Dependent(2 assist) Where Assessed-Lower Body Bathing: Edge of bed Upper Body Dressing: Maximal assistance Where Assessed-Upper Body Dressing: Edge of bed Lower Body Dressing: Dependent(2 assist) Where Assessed-Lower Body Dressing: Edge of bed Toileting: Dependent(using Stedy) Where Assessed-Toileting: Bedside Commode Toilet Transfer: Maximal assistance Toilet Transfer Method: Stand pivot Science writer: Engineer, technical sales Transfer: Not assessed      Therapy/Group: Individual Therapy  Clessie Karras A Kaelin Bonelli 10/07/2018, 3:59 PM

## 2018-10-07 NOTE — IPOC Note (Addendum)
Overall Plan of Care [facility]) Patient Details Name: Patrick Franklin MRN: [id number] DOB: 17-Oct-1970  Admitting Diagnosis: Left middle cerebral artery stroke [facility])  Hospital Problems: Principal Problem:   Left middle cerebral artery stroke [facility])     Functional Problem List: Nursing Behavior, Bladder, Endurance, Medication Management, Motor, Perception, Safety  PT Balance, Motor, Safety, Perception  OT Balance, Behavior, Cognition, Endurance, Motor, Perception, Safety, Sensory, Vision  SLP Cognition, Safety  TR         Basic ADLs: OT Eating, Grooming, Bathing, Dressing, Toileting     Advanced  ADLs: OT Simple Meal Preparation     Transfers: PT Bed Mobility, Bed to Chair, Car  OT Toilet     Locomotion: PT Ambulation     Additional Impairments: OT Fuctional Use of Upper Extremity  SLP        TR      Anticipated Outcomes Item Anticipated Outcome  Self Feeding Mod A  Swallowing      Basic self-care  Mod A UB self care, no goals for LB  Toileting  No goal   Bathroom Transfers Mod A  Bowel/Bladder  Min/mod assist  Transfers  min A transfers  Locomotion  mod A gait  Communication  Mod A  Cognition  Mod A  Pain  < 4  Safety/Judgment  Supervision/ min assist   Therapy Plan: PT Intensity: Minimum of 1-2 x/day ,45 to 90 minutes PT Frequency: 5 out of 7 days PT Duration Estimated Length of Stay: 12 to 14 days OT Intensity: Minimum of 1-2 x/day, 45 to 90 minutes OT Frequency: 5 out of 7 days OT Duration/Estimated Length of Stay: 12-14 days SLP Intensity: Minumum of 1-2 x/day, 30 to 90 minutes SLP Frequency: 3 to 5 out of 7 days SLP Duration/Estimated Length of Stay: 18-21 days   Due to the current state of emergency, patients may not be receiving their 3-hours of Medicare-mandated therapy.   Team Interventions: Nursing Interventions Patient/Family Education, Bladder Management, Medication Management, Discharge Planning, Psychosocial Support,  Disease Management/Prevention, Cognitive Remediation/Compensation  PT interventions Ambulation/gait training, Training and development officer, Disease management/prevention, Discharge planning, Community reintegration, DME/adaptive equipment instruction, Functional mobility training, Patient/family education, Pain management, Neuromuscular re-education, Splinting/orthotics, Therapeutic Exercise, Therapeutic Activities, Stair training, UE/LE Strength taining/ROM, UE/LE Coordination activities, Wheelchair propulsion/positioning  OT Interventions Training and development officer, Engineer, drilling, Patient/family education, Therapeutic Activities, Wheelchair propulsion/positioning, Therapeutic Exercise, Psychosocial support, Functional electrical stimulation, Cognitive remediation/compensation, Community reintegration, Functional mobility training, Self Care/advanced ADL retraining, UE/LE Strength taining/ROM, UE/LE Coordination activities, Neuromuscular re-education, Discharge planning, Disease mangement/prevention, Pain management, Splinting/orthotics, Visual/perceptual remediation/compensation  SLP Interventions Cognitive remediation/compensation, Speech/Language facilitation, Therapeutic Activities, Therapeutic Exercise, Internal/external aids, Patient/family education, Cueing hierarchy, Functional tasks  TR Interventions    SW/CM Interventions  Discharge Planning, Psychosocial assessment, Pt & Family Education   Barriers to Discharge MD  Medical stability and Incontinence  Nursing Lack of/limited family support, Behavior    PT Inaccessible home environment, Decreased caregiver support    OT Incontinence, Neurogenic Bowel & Bladder, Behavior    SLP      SW       Team Discharge Planning: Destination: PT-Skilled Nursing Facility (SNF) ,[place] (SNF) , SLP-Home Projected Follow-up: PT-Skilled nursing facility, OT-  Skilled nursing facility, SLP-24 hour  supervision/assistance, Home Health SLP Projected Equipment Needs: PT-To be determined, OT-  , SLP-None recommended by SLP Equipment Details: PT- , OT-  Patient/family involved in discharge planning: PT- Patient,  OT-Family member/caregiver, SLP-Patient unable/family or caregive not available  MD ELOS:  12-14d Medical Rehab Prognosis:  Fair Assessment:  48 year old right-handed male with history of 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 [place] well as neurogenic bladder, asthma, hypertension. Per chart review patient lives with sister and family. He does have a personal care attendant. [place] pre-COVID. Per sister he would ambulate approximately 25 feet can ambulate to the bathroom needed some assistance for stand pivot to the chair. Presented 10/01/2018 with garbled speech left side gaze. Cranial CT scan negative for acute changes. CTA head and neck left mid ICA short segment high-grade stenosis. No large vessel occlusion. COVID negative, valproic acid level 83, ammonia 24, platelets 70-82000, chemistries unremarkable. CT perfusion scan showed no core infarct. A follow-up cranial CT scan completed showing progressive posterior left MCA infarction. Patient was not a candidate for any carotid intervention. Lower extremity Dopplers negative. Echocardiogram with ejection fraction of 60% no source of embolism. EEG negative for seizure there was noted severe diffuse encephalopathy as well as cortical dysfunction in right hemisphere. Neurology consulted currently maintained on low-dose aspirin. Patient remains on Vimpat as well as valproate. Patient  initially had also been loaded with Keppra discontinued after EEG negative. Patient currently remains n.p.o. with follow-up speech therapyand diet has since been upgraded to regular.    Now requiring 24/7 Rehab RN,MD, as well as CIR level PT, OT and SLP.  Treatment team will focus on ADLs and mobility with goals set at Min/Mod A See Team Conference Notes for weekly updates to the plan of care

## 2018-10-08 ENCOUNTER — Inpatient Hospital Stay (HOSPITAL_COMMUNITY): Payer: 59

## 2018-10-08 ENCOUNTER — Inpatient Hospital Stay (HOSPITAL_COMMUNITY): Payer: 59 | Admitting: Physical Therapy

## 2018-10-08 ENCOUNTER — Inpatient Hospital Stay (HOSPITAL_COMMUNITY): Payer: 59 | Admitting: Occupational Therapy

## 2018-10-08 LAB — FACTOR 5 LEIDEN

## 2018-10-08 LAB — PROTHROMBIN GENE MUTATION

## 2018-10-08 NOTE — Discharge Instructions (Signed)
Inpatient Rehab Discharge Instructions  [place] [facility] Discharge date and time: No discharge date for patient encounter.   Activities/Precautions/ Functional Status: Activity: activity as tolerated Diet: regular diet Wound Care: none needed Functional status:  ___ No restrictions     ___ Walk up steps independently ___ 24/7 supervision/assistance   ___ Walk up steps with assistance ___ Intermittent supervision/assistance  ___ Bathe/dress independently ___ Walk with walker     _x__ Bathe/dress with assistance ___ Walk Independently    ___ Shower independently ___ Walk with assistance    ___ Shower with assistance ___ No alcohol     ___ Return to work/school ________  Special Instructions: No smoking or alcohol STROKE/TIA DISCHARGE INSTRUCTIONS SMOKING Cigarette smoking nearly doubles your risk of having a stroke & is the single most alterable risk factor  If you smoke or have smoked in the last 12 months, you are advised to quit smoking for your health.  Most of the excess cardiovascular risk related to smoking disappears within a year of stopping.  Ask you doctor about anti-smoking medications  [place] Quit Line: 1-800-QUIT NOW  Free Smoking Cessation Classes [phone]  CHOLESTEROL Know your levels; limit fat & cholesterol in your diet  Lipid Panel     Component Value Date/Time   CHOL 117 10/02/2018 0333   TRIG 129 10/02/2018 0333   HDL 32 (L) 10/02/2018 0333   CHOLHDL 3.7 10/02/2018 0333   VLDL 26 10/02/2018 0333   LDLCALC 59 10/02/2018 0333      Many patients benefit from treatment even if their cholesterol is at goal.  Goal: Total Cholesterol (CHOL) less than 160  Goal:  Triglycerides (TRIG) less than 150  Goal:  HDL greater than 40  Goal:  LDL (LDLCALC) less than 100   BLOOD PRESSURE American Stroke Association blood pressure target is less that 120/80 mm/Hg  Your discharge blood pressure is:  BP: (!) 136/100  Monitor your blood pressure  Limit your  salt and alcohol intake  Many individuals will require more than one medication for high blood pressure  DIABETES (A1c is a blood sugar average for last 3 months) Goal HGBA1c is under 7% (HBGA1c is blood sugar average for last 3 months)  Diabetes: No known diagnosis of diabetes    Lab Results  Component Value Date   HGBA1C 5.2 10/02/2018     Your HGBA1c can be lowered with medications, healthy diet, and exercise.  Check your blood sugar as directed by your physician  Call your physician if you experience unexplained or low blood sugars.  PHYSICAL ACTIVITY/REHABILITATION Goal is 30 minutes at least 4 days per week  Activity: Increase activity slowly, Therapies: Physical Therapy: Home Health Return to work:   Activity decreases your risk of heart attack and stroke and makes your heart stronger.  It helps control your weight and blood pressure; helps you relax and can improve your mood.  Participate in a regular exercise program.  Talk with your doctor about the best form of exercise for you (dancing, walking, swimming, cycling).  DIET/WEIGHT Goal is to maintain a healthy weight  Your discharge diet is:  Diet Order            Diet regular Room service appropriate? Yes; Fluid consistency: Thin  Diet effective now              liquids Your height is:    Your current weight is:   Your Body Mass Index (BMI) is:     Following  the type of diet specifically designed for you will help prevent another stroke.  Your goal weight range is:    Your goal Body Mass Index (BMI) is 19-24.  Healthy food habits can help reduce 3 risk factors for stroke:  High cholesterol, hypertension, and excess weight.  RESOURCES Stroke/Support Group:  Call [phone]   STROKE EDUCATION PROVIDED/REVIEWED AND GIVEN TO PATIENT Stroke warning signs and symptoms How to activate emergency medical system (call 911). Medications prescribed at discharge. Need for follow-up after discharge. Personal risk  factors for stroke. Pneumonia vaccine given:  Flu vaccine given:  My questions have been answered, the writing is legible, and I understand these instructions.  I will adhere to these goals & educational materials that have been provided to me after my discharge from the hospital.      My questions have been answered and I understand these instructions. I will adhere to these goals and the provided educational materials after my discharge from the hospital.  Patient/Caregiver Signature _______________________________ Date __________  Clinician Signature _______________________________________ Date __________  Please bring this form and your medication list with you to all your follow-up doctor's appointments.

## 2018-10-08 NOTE — Progress Notes (Signed)
Social Work Assessment and Plan   Patient Details  Name: Patrick Franklin MRN: [id number] Date of Birth: 1970/03/26  Today's Date: 10/08/2018  Problem List:  Patient Active Problem List   Diagnosis Date Noted   Left middle cerebral artery stroke ([place]) 10/05/2018   Cerebral embolism with cerebral infarction 10/03/2018   Neurological deficit present 10/01/2018   Thrombocytopenia ([place]) 10/01/2018   Neurogenic bladder 10/01/2018   Medicare annual wellness visit, subsequent 07/23/2018   Spastic hemiplegia affecting nondominant side ([place]) 03/26/2018   Impaired decision making 08/30/2017   Aorto-iliac atherosclerosis ([place]) 08/29/2017   History of renal stone 08/22/2017   Seizure disorder ([place]) 08/09/2017   Hemiparesis affecting left side as late effect of stroke ([place]) 08/09/2017   Essential hypertension 05/15/2017   Urinary retention 05/15/2017   Hyperlipidemia 05/15/2017   Hx of ischemic right MCA stroke 05/15/2017   MDD (major depressive disorder) 05/15/2017   Past Medical History:  Past Medical History:  Diagnosis Date   Asthma    Cardiomegaly 04/13/2016   Depression    DVT (deep venous thrombosis) (HCC)    GERD (gastroesophageal reflux disease)    Hemiplegia and hemiparesis    Left side   Hypertension    Neurogenic bladder    Neurogenic bowel    Neuropathy    Pulmonary embolism ([place]) 01/13/2016   Seizures ([place])    Stroke ([place]) 12/31/2015   left side deficits    Subluxation of shoulder joint 04/13/2016   left   UTI (urinary tract infection)    Past Surgical History:  Past Surgical History:  Procedure Laterality Date   CRANIECTOMY  2017   CRANIOPLASTY  2018   IVC FILTER INSERTION  2017   PEG TUBE PLACEMENT  2017   VENTRICULOPERITONEAL SHUNT  2018   Social History:  reports that he has never smoked. He has never used smokeless tobacco. He reports previous alcohol use. He reports that he does not use drugs.  Family /  Support Systems Marital Status: Divorced Patient Roles: Parent, Other (Comment)(sibling) Children: two grown children and a15 yo-in [place] and 6 yo-in [place] Other Supports: Oletta Darter [phone]    Kenneth-brother in-law [phone] Anticipated Caregiver: SNF Almyra Free and Chrissie Noa have been discussing going to a facility but with COVID deferred Ability/Limitations of Caregiver: Almyra Free and Chrissie Noa feel pt is too much care and will need to go to a NH upon DC from CIR Caregiver Availability: Other (Comment)(feel NH best option for pt from here) Family Dynamics: Close with his sister and her family, his children-three are in [place] and one in [place]. His parents are still living and he was living with his sister and her family who were providing care to him since 2018. Pt also had a privated duty caregiver 5 days a week while sister and brother in-law worked.  Social History Preferred language: English Religion:  Cultural Background: No issues Education: Secretary/administrator educated Read: Yes Write: Yes Employment Status: Disabled Date Retired/Disabled/Unemployed: 2017 after first CVA Legal History/Current Legal Issues: No issues Guardian/Conservator: No issues-MD feels pt is not capable of making his own decisions here. Almyra Free is his POA/HCPOA and will make any decision for him while here, also very involved in his case.   Abuse/Neglect Abuse/Neglect Assessment Can Be Completed: Unable to assess, patient is non-responsive or altered mental status  Emotional Status [name] affect, behavior and adjustment status: Pt is oriented to himself but could not provide information for this assessment due to his deficits. His sister-Julie provided. He was CGA-min assist prior  to this stroke. He ambulated short distances-25 ft and used a wheelchair prior to this stroke. Raynelle FanningJulie reports he has been an easy going man and rolled with the punches. He has a history of depression since first CVA in 2017. Recent Psychosocial  Issues: has required assist since first CVA 2017 and been living with sister and brother in-law since 2018 Psychiatric History: History of depression takes medications for this and are helpful. If appropriate will have see neuro-psych while here, will get input from team. Substance Abuse History: No issues  Patient / Family Perceptions, Expectations & Goals Pt/Family understanding of illness & functional limitations: Sister is able to explain this stroke along with his deficits from his first stroke. She talks with the MD and is aware he has a ways to go to get back to his prior function post first CVA and also now his other side is affected. She tries to come in early evening to see him Premorbid pt/family roles/activities: Brother, son, uncle, etc Anticipated changes in roles/activities/participation: resume Pt/family expectations/goals: Raynelle FanningJulie states: " We hope he makes some progress there, but need him to go to a facility from here."  Pt nodded when introduced myself to him.  Community Resources [employer] Agencies: Other (Comment)([facility] 2x week and private duty 5 days) Premorbid Home Care/DME Agencies: Other (Comment)(has trasnport chair, rw, bsc) Transportation available at discharge: Sister and her husband Resource referrals recommended: Neuropsychology, Support group (specify)  Discharge Planning Living Arrangements: Other relatives Support Systems: Other relatives Type of Residence: Private residence Insurance Resources: [employer]are Financial Resources: SSD Living Expenses: Lives with family Money Management: Family Does the patient have any problems obtaining your medications?: No Home Management: Family Patient/Family Preliminary Plans: Has been living with his sister and her family for the past two years. Just recieved his Medicare and sister to apply for Medicaid for him. Had disucssed going to a facilty prior to COVID, but now since second stroke he will need to go to  a NH from rehab. Sister feels he is too much physical care for them but will remain very involved with him. Sw Barriers to Discharge: Other (comments) Sw Barriers to Discharge Comments: enclosure bed is a restraint Social Work Anticipated Follow Up Needs: SNF  Clinical Impression Very unfortuante gentleman who is here after having another stroke. He had a severe CVA in 2017 and [facility] then went to sister's home, where she and brother in-law have been providing 24 hr care along with a private duty caregiver 5 days a week for the past 18 months. Pt is only oriented to self and not able to participate in the assessment at this time. Will continue to re-assess this. Will work on discharge, encouraged sister to apply for medicaid since Medicare is his only insurance. Await team conference Wed.  Lucy Chrisupree, Deloris Moger G 10/08/2018, 12:40 PM

## 2018-10-08 NOTE — Progress Notes (Signed)
Speech Language Pathology Daily Session Note  Patient Details  Name: Patrick Franklin MRN: [id number] Date of Birth: [birth year]  Today's Date: 10/08/2018 SLP Individual Time: 1402-1500 SLP Individual Time Calculation (min): 58 min  Short Term Goals: Week 1: SLP Short Term Goal 1 (Week 1): Patient will participate in further assesssment of expressive/receptive language skills. SLP Short Term Goal 2 (Week 1): Patient will attend to basic, familiar task for 5 minutes with max verbal cues for redirection. SLP Short Term Goal 3 (Week 1): Patient will complete basic, familiar tasks with max a verbal cues for functional problem sovling.  Skilled Therapeutic Interventions: Skilled ST services focused on cognitive skills. Pt was asleep upon entering room, however easily aroused for short periods of time. Pt had not consumed lunch, SLP notified NT and updated order for full supervision during PO intake due to cognitive deficits. SLP and NT attempted to transfer pt from posey bed to recline with stedy, however pt's reduced alertness resulted in reduced safety. SLP assisted pt with PO consumption thin via straw and a few bites of mash potatoes and salad while in bed in incline position. Pt demonstrated 30 seconds to 1 minute sustained attention and required max A verbal cues to redirect during self feeding, PO consumption task was ended early due to reduce attention and safety. Pt was able to answer approximately 30% of posed basic questions and commands, most of the verbal/functional interaction was pt directed and off topic. Pt demonstrated ability to express wants/needs at phrase/sentence level, requesting ginger ale, reposition bed and food items. Pt believed SLP to be "liz" his girlfriend despite several corrects. Pt did participate in informal language assessment, responding to simple yes/no questions in 3/5 opportunities, identifying common objects in a field of 2 in 5/5 opportunities and in a field of 3  in 5/5 opportunities, as well as naming 8/10 common objects.[name] reduced alertness and attention, along with continued confusion were the biggest barriers in today's ST session. Pt was left in room with call bell within reach, bed alarm set and secure. ST recommends to continue skilled ST services.      Pain Pain Assessment Pain Scale: 0-10 Pain Score: 10-Worst pain ever Pain Type: Acute pain Pain Location: Head Pain Descriptors / Indicators: Headache Pain Intervention(s): RN made aware  Therapy/Group: Individual Therapy  Ardean Simonich  [facility] 10/08/2018, 3:05 PM

## 2018-10-08 NOTE — Progress Notes (Signed)
Physical Therapy Session Note  Patient Details  Name: Patrick Franklin MRN: [id number] Date of Birth: 10/31/1970  Today's Date: 10/08/2018 PT Individual Time: 0803-0900 PT Individual Time Calculation (min): 57 min   Short Term Goals: Week 1:  PT Short Term Goal 1 (Week 1): Pt will increase bed mobility to mod A. PT Short Term Goal 2 (Week 1): Pt will increase transfers to mod A. PT Short Term Goal 3 (Week 1): Pt will ambulate with LRAD about 10 feet with max A.  Skilled Therapeutic Interventions/Progress Updates:  Pt presented in bed agreeable to therapy. Pt stating "head hurts" nsg arrived to provide am meds including pain meds. Pt performed supine to sit modA and maintained seated balance with S while pt ate breakfast. Pt returned to supine modA after eating as PTA left room to obtain w/c. Once returned PTA threaded pants in supine maxA for time management, however was unable to perform bridge to clear hips and pull pants over hips. Pt performed supine to sit in same manner as prior and performed squat pivot to R with modA to w/c. Pt required max cues and increased time to perform transfer. PTA donned socks/shoes/L AFO for time management. Pt transported to rehab gym for energy conservation and time management. Per pt report used a hemi-walker previously. HW obtained and pt performed STS x 3 with modA and max cues for placement of LLE. Pt was able to ambulate approx 59f with HW and mod/maxA with max cues for [facility] and LLE placement. Pt transported back to room and performed stand pivot transfer to enclosure bed. Pt required modA sit to supine and repositioned to comfort. Pt left in enclosure bed with call bell within reach and needs met.      Therapy Documentation Precautions:  Precautions Precautions: Fall Precaution Comments: Lt hemi + Lt hemianopsia at baseline Restrictions Weight Bearing Restrictions: No    Therapy/Group: Individual Therapy  Trinetta Alemu  Nevah Dalal, PTA  10/08/2018, 2:35 PM

## 2018-10-08 NOTE — Progress Notes (Signed)
Occupational Therapy Session Note  Patient Details  Name: Patrick Franklin MRN: [id number] Date of Birth: 12-Jul-1970  Today's Date: 10/08/2018 OT Individual Time: 1002-1101 OT Individual Time Calculation (min): 59 min   Short Term Goals: Week 1:  OT Short Term Goal 1 (Week 1): Pt will maintain sustained attention to 1 ADL task for 30 seconds with mod vcs OT Short Term Goal 2 (Week 1): Pt will sit EOB/EOM for 20 minutes during functional activity with no more than Min A for sitting balance  Skilled Therapeutic Interventions/Progress Updates:    Pt greeted in bed, asleep, opening eyes a little while spoken to. Alertness noticeably increased when sternal rub applied. He verbalized "I have to pee pee." When presented with BSC pt initiated transition EOB with Max A. OT donned his shoes, including Lt AFO component. Sublux brace was already donned. Mod-Max A for stand pivot<BSC using hemi walker with noted L LE ataxia. He initiated lowering clothing before sitting. Pt provided with increased time to void, however did not void. Handed him soapy wash cloths for cleaning periarea. Per pt, "what do you want me to do?" Able to follow this verbal instruction when cued to stand and complete hygiene. Mod A for balance during this time. He transferred back to the bed, and then transferred to the w/c. Positioned pt seated at the sink where he completed oral care, face washing, and hair washing/combing using one-handed techniques with min vcs for sequencing (he kept leaving the water running). Mod-Max A stand pivot transfer to the recliner, as pt kept gesturing towards it. Provided pt with orientation information, including time, place, and situation while sitting OOB. Pt up until this point was adamant that OT was former therapist Patrick Franklin or his sister Patrick Franklin. At this time, pt politely stated "you're an imposter- you're the devil." Attempted to reorient pt, but due to receptive aphasia, this was very difficult. When  another staff member entered the room, pt asked her if OT was the devil. He then transferred back to bed and reported urgent need to urinate. Pt was setup with urinal but unable to void given significantly increased time. Left him with NT while EOB, trying to void using the urinal.      Therapy Documentation Precautions:  Precautions Precautions: Fall Precaution Comments: Lt hemi + Lt hemianopsia at baseline Restrictions Weight Bearing Restrictions: No Pain: Pain Assessment Pain Scale: 0-10 Pain Score: 10-Worst pain ever Pain Type: Acute pain Pain Location: Head Pain Descriptors / Indicators: Headache Pain Intervention(s): RN made aware ADL: ADL Eating: Not assessed Grooming: Moderate assistance(combing hair) Where Assessed-Grooming: Chair Upper Body Bathing: Maximal assistance Where Assessed-Upper Body Bathing: Edge of bed Lower Body Bathing: Dependent(2 assist) Where Assessed-Lower Body Bathing: Edge of bed Upper Body Dressing: Maximal assistance Where Assessed-Upper Body Dressing: Edge of bed Lower Body Dressing: Dependent(2 assist) Where Assessed-Lower Body Dressing: Edge of bed Toileting: Dependent(using Stedy) Where Assessed-Toileting: Bedside Commode Toilet Transfer: Maximal assistance Toilet Transfer Method: Stand pivot Acupuncturist: Animator Transfer: Not assessed :    Therapy/Group: Individual Therapy  Patrick Franklin A Patrick Franklin 10/08/2018, 4:19 PM

## 2018-10-08 NOTE — Progress Notes (Signed)
Inpatient Rehabilitation  Patient information reviewed and entered into eRehab system by [facility]. Loni Beckwith., CCC/SLP, PPS Coordinator.  Information including medical coding, functional ability and quality indicators will be reviewed and updated through discharge.

## 2018-10-08 NOTE — Plan of Care (Signed)
Goals added from initial plan of care

## 2018-10-08 NOTE — Care Management Note (Signed)
Inpatient Rehabilitation Center Individual Statement of Services  Patient Name:  Patrick Franklin  Date:  10/08/2018  Welcome to the Inpatient Rehabilitation Center.  Our goal is to provide you with an individualized program based on your diagnosis and situation, designed to meet your specific needs.  With this comprehensive rehabilitation program, you will be expected to participate in at least 3 hours of rehabilitation therapies Monday-Friday, with modified therapy programming on the weekends.  Your rehabilitation program will include the following services:  Physical Therapy (PT), Occupational Therapy (OT), Speech Therapy (ST), 24 hour per day rehabilitation nursing, Therapeutic Recreaction (TR), Neuropsychology, Case Management (Social Worker), Rehabilitation Medicine, Nutrition Services and Pharmacy Services  Weekly team conferences will be held on Wednesday to discuss your progress.  Your Social Worker will talk with you frequently to get your input and to update you on team discussions.  Team conferences with you and your family in attendance may also be held.  Expected length of stay: 14-18 days  Overall anticipated outcome: mod assist level  Depending on your progress and recovery, your program may change. Your Social Worker will coordinate services and will keep you informed of any changes. Your Social Worker's name and contact numbers are listed  below.  The following services may also be recommended but are not provided by the Inpatient Rehabilitation Center:    Home Health Rehabiltiation Services  Outpatient Rehabilitation Services    Arrangements will be made to provide these services after discharge if needed.  Arrangements include referral to agencies that provide these services.  Your insurance has been verified to be:  Medicare Your primary doctor is:  Vernona Rieger  Pertinent information will be shared with your doctor and your insurance company.  Social Worker:   Dossie Der, SW [phone] or ([phone]  Information discussed with and copy given to patient by: Lucy Chris, 10/08/2018, 12:17 PM

## 2018-10-08 NOTE — Progress Notes (Signed)
[place] PHYSICAL MEDICINE & REHABILITATION PROGRESS NOTE   Subjective/Complaints:  No new issues overnight.   ROS: Limited due to cognitive/behavioral   Objective:   No results found. No results for input(s): WBC, HGB, HCT, PLT in the last 72 hours. No results for input(s): NA, K, CL, CO2, GLUCOSE, BUN, CREATININE, CALCIUM in the last 72 hours.  Intake/Output Summary (Last 24 hours) at 10/08/2018 1040 Last data filed at 10/07/2018 1440 Gross per 24 hour  Intake 140 ml  Output 350 ml  Net -210 ml     Physical Exam: Vital Signs Blood pressure (!) 136/100, pulse 82, temperature 98.7 F (37.1 C), temperature source Oral, resp. rate 14, SpO2 96 %.  Constitutional: No distress . Vital signs reviewed. HEENT: EOMI, oral membranes moist Neck: supple Cardiovascular: RRR without murmur. No JVD    Respiratory: CTA Bilaterally without wheezes or rales. Normal effort    GI: BS +, non-tender, non-distended  Neurologic: Cranial nerves II through XII intact, motor strength is 5/5 in bilateral deltoid, bicep, tricep, grip, hip flexor, knee extensors, ankle dorsiflexor and plantar flexor Sensory exam intact.  Musculoskeletal: Full range of motion in all 4 extremities. No joint swelling Psych: slow to engage   Assessment/Plan: 1. Functional deficits secondary to new posterior left MCA distribution infarct in the setting of prior right MCA infarct with chronic left hemiparesis which require 3+ hours per day of interdisciplinary therapy in a comprehensive inpatient rehab setting.  Physiatrist is providing close team supervision and 24 hour management of active medical problems listed below.  Physiatrist and rehab team continue to assess barriers to discharge/monitor patient progress toward functional and medical goals  Care Tool:  Bathing        Body parts bathed by helper: Right arm, Left arm, Chest, Abdomen, Buttocks, Right upper leg, Left upper leg, Right lower leg, Left lower leg,  Face Body parts n/a: Front perineal area   Bathing assist Assist Level: 2 Helpers     Upper Body Dressing/Undressing Upper body dressing   What is the patient wearing?: Pull over shirt    Upper body assist Assist Level: Maximal Assistance - Patient 25 - 49%    Lower Body Dressing/Undressing Lower body dressing      What is the patient wearing?: Incontinence brief     Lower body assist Assist for lower body dressing: Maximal Assistance - Patient 25 - 49%     Toileting Toileting    Toileting assist Assist for toileting: Contact Guard/Touching assist     Transfers Chair/bed transfer  Transfers assist     Chair/bed transfer assist level: Maximal Assistance - Patient 25 - 49%     Locomotion Ambulation   Ambulation assist      Assist level: Maximal Assistance - Patient 25 - 49% Assistive device: No Device Max distance: 3   Walk 10 feet activity   Assist  Walk 10 feet activity did not occur: Safety/medical concerns        Walk 50 feet activity   Assist Walk 50 feet with 2 turns activity did not occur: Safety/medical concerns         Walk 150 feet activity   Assist Walk 150 feet activity did not occur: Safety/medical concerns         Walk 10 feet on uneven surface  activity   Assist Walk 10 feet on uneven surfaces activity did not occur: Safety/medical concerns         Wheelchair     Assist Will patient  use wheelchair at discharge?: Yes Type of Wheelchair: Manual Wheelchair activity did not occur: Safety/medical concerns         Wheelchair 50 feet with 2 turns activity    Assist    Wheelchair 50 feet with 2 turns activity did not occur: Safety/medical concerns       Wheelchair 150 feet activity     Assist  Wheelchair 150 feet activity did not occur: Safety/medical concerns       Blood pressure (!) 136/100, pulse 82, temperature 98.7 F (37.1 C), temperature source Oral, resp. rate 14, SpO2 96  %.  Medical Problem List and Plan: 1.Slurred speech left side gazesecondary to left MCA infarctionWernicke's area secondary to left mid ICA short segment high-grade stenosis on 10/01/2018. Patient has hx ofprior right MCA infarction as well as VP shunt for hydrocephalus CIR PT, OT, SLP evals -continue enclosure bed for safety.  - previous level of functioning required min assist with a gait belt for ambulation short distances but because of some worsening of balance family has used transfer chair more frequently 2. Antithrombotics: -DVT/anticoagulation:SCDs. Venous Dopplers negative -antiplatelet therapy: Aspirin 81 mg daily 3. Pain Management:Tylenol as needed 4. Mood:Provide emotional support -antipsychotic agents: N/A 5. Neuropsych: This patientis notcapable of making decisions on hisown behalf. 6. Skin/Wound Care:Routine skin checks 7. Fluids/Electrolytes/Nutrition:Routine in and outs with follow-up chemistrieson admit. 8. Seizure disorder. Vimpat 100 mg twice daily, Depakote 1000 mg twice daily. EEG negative, no evidence of seizure since admitted to rehab 9. Dysphagia. Follow-up speech therapy. Diet has been advanced to regular as of 10/04/2018. 10. Hypertension. Permissive hypertension. Patient on metoprolol 25 mg twice daily prior to admission. Resume as needed Vitals:   10/05/18 2000 10/07/18 1315  BP: (!) 136/100   Pulse: 82   Resp: 14   Temp: 98.8 F (37.1 C) 98.7 F (37.1 C)  SpO2: 96%   The patient is 7 days post infarct. Will defer tighter control to primary team this week 11. Hyperlipidemia. Lipitor 80 mg daily 12. Neurogenic bladder. generally continent. 13. Chronic thrombocytopenia. Stable    LOS: 3 days A FACE TO FACE EVALUATION WAS PERFORMED  Ranelle OysterZachary T Branden Shallenberger 10/08/2018, 10:40 AM

## 2018-10-09 ENCOUNTER — Inpatient Hospital Stay (HOSPITAL_COMMUNITY): Payer: 59

## 2018-10-09 ENCOUNTER — Inpatient Hospital Stay (HOSPITAL_COMMUNITY): Payer: 59 | Admitting: Occupational Therapy

## 2018-10-09 ENCOUNTER — Inpatient Hospital Stay (HOSPITAL_COMMUNITY): Payer: 59 | Admitting: Physical Therapy

## 2018-10-09 MED ORDER — TOPIRAMATE 25 MG PO TABS
25.0000 mg | ORAL_TABLET | Freq: Every day | ORAL | Status: DC
  Administered 2018-10-09 – 2018-10-10 (×2): 25 mg via ORAL
  Filled 2018-10-09 (×2): qty 1

## 2018-10-09 NOTE — Progress Notes (Addendum)
Patient running consistently high diastolic blood pressures.  Will consult with physician.

## 2018-10-09 NOTE — Progress Notes (Signed)
Physical Therapy Session Note  Patient Details  Name: Patrick Franklin MRN: [id number] Date of Birth: 1970/03/04  Today's Date: 10/09/2018 PT Individual Time: 0809-0907 PT Individual Time Calculation (min): 58 min   Short Term Goals: Week 1:  PT Short Term Goal 1 (Week 1): Pt will increase bed mobility to mod A. PT Short Term Goal 2 (Week 1): Pt will increase transfers to mod A. PT Short Term Goal 3 (Week 1): Pt will ambulate with LRAD about 10 feet with max A.  Skilled Therapeutic Interventions/Progress Updates:  Pt presented in bed agreeable to therapy. Pt stating HA but unable to provide rating. Pt indicated urgency for void. Performed supine to sit EOB on L with modA with PTA assisting with urinal placement (-void). Pt indicating need for BM. Performed stand pivot transfer to [place] with increased time due to decreased ability to follow commands. Provided maxA for clothing management (+BM/void). Performed STS with HW to allow NT to perform peri-hygiene with PTA directing pt to remain standing as attempted to return to bed. Pt sat in w/c after hygiene and transported to rehab gym. Performed squat pivot to mat modA and performed STS x5 with HW. Performed AROM LLE for forced use as follows: hip flexion, LAQ x 10. Pt required mod cues to perform activity however after 2-3 reps was able to perform appropriately. Performed stand pivot transfer to w/c to R maxA and pt transported back to room. Performed squat pivot to bed on R modA and pt was able to perform sit to supine at EOB with minA. With PTA blocking B feet pt able to scoot to [facility] with bridging. Pt left in bed at end of session with call bell within reach, enclosure bed closed, and needs met.      Therapy Documentation Precautions:  Precautions Precautions: Fall Precaution Comments: Lt hemi + Lt hemianopsia at baseline Restrictions Weight Bearing Restrictions: No    Therapy/Group: Individual Therapy  Bearett Porcaro  Emelia Sandoval, PTA  10/09/2018, 12:25 PM

## 2018-10-09 NOTE — Progress Notes (Signed)
[place] PHYSICAL MEDICINE & REHABILITATION PROGRESS NOTE   Subjective/Complaints:  C/o right frontal HA , some emotional lability noted as well. Out of bed with PT  ROS: Limited due to cognitive/behavioral   Objective:   No results found. No results for input(s): WBC, HGB, HCT, PLT in the last 72 hours. No results for input(s): NA, K, CL, CO2, GLUCOSE, BUN, CREATININE, CALCIUM in the last 72 hours.  Intake/Output Summary (Last 24 hours) at 10/09/2018 0911 Last data filed at 10/09/2018 0831 Gross per 24 hour  Intake 260 ml  Output 450 ml  Net -190 ml     Physical Exam: Vital Signs Blood pressure (!) 136/100, pulse 82, temperature 98.7 F (37.1 C), temperature source Oral, resp. rate 14, SpO2 96 %.  Constitutional: No distress . Vital signs reviewed. HEENT: EOMI, oral membranes moist Neck: supple Cardiovascular: RRR without murmur. No JVD    Respiratory: CTA Bilaterally without wheezes or rales. Normal effort    GI: BS +, non-tender, non-distended  Neurologic: Cranial nerves II through XII intact, motor strength is 5/5 in right  deltoid, bicep, tricep, grip, hip flexor, knee extensors, ankle dorsiflexor and plantar flexor Left sie trace grip in UE, 2- prox LLE, AFO Left foot ankle  Sensory exam intact.  Musculoskeletal: Full range of motion in all 4 extremities. No joint swelling Psych: + labile   Assessment/Plan: 1. Functional deficits secondary to new posterior left MCA distribution infarct in the setting of prior right MCA infarct with chronic left hemiparesis which require 3+ hours per day of interdisciplinary therapy in a comprehensive inpatient rehab setting.  Physiatrist is providing close team supervision and 24 hour management of active medical problems listed below.  Physiatrist and rehab team continue to assess barriers to discharge/monitor patient progress toward functional and medical goals  Care Tool:  Bathing        Body parts bathed by helper: Right  arm, Left arm, Chest, Abdomen, Buttocks, Right upper leg, Left upper leg, Right lower leg, Left lower leg, Face Body parts n/a: Front perineal area   Bathing assist Assist Level: 2 Helpers     Upper Body Dressing/Undressing Upper body dressing   What is the patient wearing?: Pull over shirt    Upper body assist Assist Level: Maximal Assistance - Patient 25 - 49%    Lower Body Dressing/Undressing Lower body dressing      What is the patient wearing?: Incontinence brief     Lower body assist Assist for lower body dressing: Maximal Assistance - Patient 25 - 49%     Toileting Toileting    Toileting assist Assist for toileting: Moderate Assistance - Patient 50 - 74%     Transfers Chair/bed transfer  Transfers assist     Chair/bed transfer assist level: Maximal Assistance - Patient 25 - 49%     Locomotion Ambulation   Ambulation assist      Assist level: Maximal Assistance - Patient 25 - 49% Assistive device: No Device Max distance: 3   Walk 10 feet activity   Assist  Walk 10 feet activity did not occur: Safety/medical concerns        Walk 50 feet activity   Assist Walk 50 feet with 2 turns activity did not occur: Safety/medical concerns         Walk 150 feet activity   Assist Walk 150 feet activity did not occur: Safety/medical concerns         Walk 10 feet on uneven surface  activity  Assist Walk 10 feet on uneven surfaces activity did not occur: Safety/medical concerns         Wheelchair     Assist Will patient use wheelchair at discharge?: Yes Type of Wheelchair: Manual Wheelchair activity did not occur: Safety/medical concerns         Wheelchair 50 feet with 2 turns activity    Assist    Wheelchair 50 feet with 2 turns activity did not occur: Safety/medical concerns       Wheelchair 150 feet activity     Assist  Wheelchair 150 feet activity did not occur: Safety/medical concerns       Blood  pressure (!) 136/100, pulse 82, temperature 98.7 F (37.1 C), temperature source Oral, resp. rate 14, SpO2 96 %.  Medical Problem List and Plan: 1.Slurred speech left side gazesecondary to left MCA infarctionWernicke's area secondary to left mid ICA short segment high-grade stenosis on 10/01/2018. Patient has hx ofprior right MCA infarction as well as VP shunt for hydrocephalus CIR PT, OT, SLP team conf in am  -continue enclosure bed for safety.  - previous level of functioning required min assist with a gait belt for ambulation short distances but because of some worsening of balance family has used transfer chair more frequently 2. Antithrombotics: -DVT/anticoagulation:SCDs. Venous Dopplers negative -antiplatelet therapy: Aspirin 81 mg daily 3. Pain Management:Tylenol as needed.  Vascular HA trial topamax 4. Mood:Provide emotional support -antipsychotic agents: N/A 5. Neuropsych: This patientis notcapable of making decisions on hisown behalf. 6. Skin/Wound Care:Routine skin checks 7. Fluids/Electrolytes/Nutrition:Routine in and outs with follow-up chemistrieson admit. 8. Seizure disorder. Vimpat 100 mg twice daily, Depakote 1000 mg twice daily. EEG negative, no evidence of seizure since admitted to rehab 9. Dysphagia. Follow-up speech therapy. Diet has been advanced to regular as of 10/04/2018. 10. Hypertension. Permissive hypertension. Patient on metoprolol 25 mg twice daily prior to admission. Resume as needed Vitals:   10/05/18 2000 10/07/18 1315  BP: (!) 136/100   Pulse: 82   Resp: 14   Temp: 98.8 F (37.1 C) 98.7 F (37.1 C)  SpO2: 96%   The patient is 7 days post infarct. Will start tightening control this week 11. Hyperlipidemia. Lipitor 80 mg daily 12. Neurogenic bladder. generally continent. 13. Chronic thrombocytopenia. Stable    LOS: 4 days A FACE TO FACE EVALUATION WAS  PERFORMED  Erick Colacendrew E Traye Bates 10/09/2018, 9:11 AM

## 2018-10-09 NOTE — Progress Notes (Signed)
Speech Language Pathology Daily Session Note  Patient Details  Name: Patrick Franklin MRN: [id number] Date of Birth: 23-Aug-1970  Today's Date: 10/09/2018 SLP Individual Time: 1003-1103 SLP Individual Time Calculation (min): 60 min  Short Term Goals: Week 1: SLP Short Term Goal 1 (Week 1): Patient will express wants and needs with min A verbal cues. SLP Short Term Goal 2 (Week 1): Patient will attend to basic, familiar task for 5 minutes with max verbal cues for redirection. SLP Short Term Goal 3 (Week 1): Patient will complete basic, familiar tasks with max a verbal cues for functional problem sovling. SLP Short Term Goal 4 (Week 1): Patient will follow basic commands with mod A verbal cues.  Skilled Therapeutic Interventions:Skilled ST services focused on cognitive skills. Pt was initially asleep upon entering and required max A verbal cues to become alert. SLP facilitated sustained attention and express ability in simple picture description task, pt identified 8/10 actions, with difficulty "seeing" some cards, however given sentence completion cue able to identify. Pt demonstrated increase verbal output, communicating in conversation on self directed topics and ability to express wants and needs with mod-min A verbal cues. SLP also facilitated sustained attention and basic problem solving in novel card task WAR, pt required mod A verbal cues to identify highest card with visual deficits noted, however required total A fading to max A verbal cues for problem solving when sorting same cards by color, with overall sustained attention in 3-4 minute intervals with mod A verbal cues for redirection. Pt demonstrated overall increase in ability to follow commands and respond to verbal questions compared to pervious sessions, however continued with confusion (called SLP by several different names) and inappropriate interactions (professing love to SLP.)  SLP communicated with pt's sister, Almyra Free, via phone  to establish baseline skills, pt was able to express wants/needs with occasional difficulties, deficits include left visual field/neglect, attention (off topic and sustained attention for long periods of time), short/long term recall and likely min-supervision A basic problem solving in tasks such as basic games. Pt was left in room with call bell within reach and bed secure. ST recommends to continue skilled ST services.      Pain Pain Assessment Pain Score: 0-No pain  Therapy/Group: Individual Therapy  Lakina Mcintire  [facility] 10/09/2018, 12:46 PM

## 2018-10-09 NOTE — Progress Notes (Signed)
Occupational Therapy Session Note  Patient Details  Name: Patrick Franklin MRN: [id number] Date of Birth: [birth year]  Today's Date: 10/09/2018 OT Individual Time: 1100-1140 OT Individual Time Calculation (min): 40 min  and Today's Date: 10/09/2018 OT Missed Time: 20 Minutes Missed Time Reason: Pain   Short Term Goals: Week 1:  OT Short Term Goal 1 (Week 1): Pt will maintain sustained attention to 1 ADL task for 30 seconds with mod vcs OT Short Term Goal 2 (Week 1): Pt will sit EOB/EOM for 20 minutes during functional activity with no more than Min A for sitting balance  Skilled Therapeutic Interventions/Progress Updates:    Treatment session limited as pt internally distracted by headache.  Pt required increased time and cues to motivate pt to come to EOB to engage in therapeutic activity.  Once agreeable, pt required mod assist for bed mobility and max assist stand pivot transfer to w/c.  Pt c/o pain when WB through LLE during transfer.  Engaged in grooming tasks in sitting at sink with focus on visual scanning/head turning to locate items on sink.  Pt able to apply toothpaste to toothbrush even opening container with just Rt hand.  Frequent cues for attention to water running.  Attempted to engage pt in activity out of room, with pt stating "but my headache" and requesting to return to bed.  Completed stand pivot transfer max assist and returned to supine in enclosure bed for pt safety.  Pt missed 20 mins due to pain and decreased ability to maintain attention to task due to headache.  Therapy Documentation Precautions:  Precautions Precautions: Fall Precaution Comments: Lt hemi + Lt hemianopsia at baseline Restrictions Weight Bearing Restrictions: No General: General OT Amount of Missed Time: 20 Minutes Pain:  Pt with c/o headache, unable to rate.   Therapy/Group: Individual Therapy  Simonne Come 10/09/2018, 12:15 PM

## 2018-10-09 NOTE — Plan of Care (Signed)
°  Problem: RH Comprehension Communication Goal: LTG Patient will comprehend basic/complex auditory (SLP) Description: LTG: Patient will comprehend basic/complex auditory information with cues (SLP). Flowsheets (Taken 10/09/2018 1247) LTG: Patient will comprehend auditory information with cueing (SLP): Minimal Assistance - Patient > 75% Note: Upgraded due to novel baseline information

## 2018-10-10 ENCOUNTER — Inpatient Hospital Stay (HOSPITAL_COMMUNITY): Payer: 59 | Admitting: Physical Therapy

## 2018-10-10 ENCOUNTER — Inpatient Hospital Stay (HOSPITAL_COMMUNITY): Payer: 59 | Admitting: Occupational Therapy

## 2018-10-10 ENCOUNTER — Inpatient Hospital Stay (HOSPITAL_COMMUNITY): Payer: 59 | Admitting: Speech Pathology

## 2018-10-10 MED ORDER — TOPIRAMATE 25 MG PO TABS
25.0000 mg | ORAL_TABLET | Freq: Two times a day (BID) | ORAL | Status: DC
  Administered 2018-10-10 – 2018-10-16 (×13): 25 mg via ORAL
  Filled 2018-10-10 (×14): qty 1

## 2018-10-10 NOTE — Patient Care Conference (Signed)
Inpatient RehabilitationTeam Conference and Plan of Care Update Date: 10/10/2018   Time: 10:55 AM    Patient Name: Patrick Franklin      Medical Record Number: [id number]  Date of Birth: March 11, 1970 Sex: Male         Room/Bed: 4W10C/4W10C-01 Payor Info: Payor: MEDICARE / Plan: MEDICARE PART A AND B / Product Type: *No Product type* /    Admit Date/Time:  10/05/2018  4:42 PM  Primary Diagnosis:  Left middle cerebral artery stroke [facility](HCC)  Patient Active Problem List   Diagnosis Date Noted   Left middle cerebral artery stroke (HCC) 10/05/2018   Cerebral embolism with cerebral infarction 10/03/2018   Neurological deficit present 10/01/2018   Thrombocytopenia (HCC) 10/01/2018   Neurogenic bladder 10/01/2018   Medicare annual wellness visit, subsequent 07/23/2018   Spastic hemiplegia affecting nondominant side (HCC) 03/26/2018   Impaired decision making 08/30/2017   Aorto-iliac atherosclerosis (HCC) 08/29/2017   History of renal stone 08/22/2017   Seizure disorder (HCC) 08/09/2017   Hemiparesis affecting left side as late effect of stroke (HCC) 08/09/2017   Essential hypertension 05/15/2017   Urinary retention 05/15/2017   Hyperlipidemia 05/15/2017   Hx of ischemic right MCA stroke 05/15/2017   MDD (major depressive disorder) 05/15/2017    Expected Discharge Date: Expected Discharge Date: 10/19/18(Versus NHP)  Team Members Present: Physician leading conference: Dr. Claudette LawsAndrew Kirsteins Social Worker Present: Dossie DerBecky Nysir Fergusson, LCSW Nurse Present: Kennyth ArnoldStacey Jennings, RN PT Present: Grier RocherAustin Tucker, PT;Rosita Dechalus, PTA OT Present: Jackquline DenmarkKatie Bradsher, OT SLP Present: Reuel DerbyHappi Overton, SLP PPS Coordinator present : Fae PippinMelissa Bowie, SLP     Current Status/Progress Goal Weekly Team Focus  Bowel/Bladder   incontinent of bowel and bladder. LBM 10/10/2018  Min assist with b/b  To be continent of b/b with min assist   Swallow/Nutrition/ Hydration             ADL's   Max A-2 assist  overall with stand pivot transfers + self care tasks sit<stand.  Mod A overall  NMR, functional transfers, ADL retraining, cognition   Mobility   modA bed mobility, maxA stand pivot transfers, modA STS with HW, maxA gait with HW  min A transfers, mod A gait  transfers, dynamic balance, gait   Communication   Max A to Mod A for wants and needs  Supervision  communication of wants/needs   Safety/Cognition/ Behavioral Observations  Max A  Mod A for basic cognition  sustained attention   Pain   c/o headache, 650mg  tylenol prn, 25mg  Topomax BID  <3 on a faces pain scale  assess pain q 4hr and prn   Skin   CDI  Remain free from breakdown  assess skin q shift and prn      *See Care Plan and progress notes for long and short-term goals.     Barriers to Discharge  Current Status/Progress Possible Resolutions Date Resolved   Nursing                  PT                    OT                  SLP                SW Other (comments) enclosure bed is a restraint            Discharge Planning/Teaching Needs:  Sister feels he will need more rehab and wants  to pursue NH upon DC from CIR. [name] care is too much for sister and her hsuband along with working and taking care of children. She will talk with pt about this.      Team Discussion:  Goals min-mod level of assist. Currently plus 2 max level. Doing better today ambulated 10 ft in PT with max assist. BP issues MD working on. MD reports enclosure bed can be DC. Headache has limited his participation in therapies, MD has increased the dosage of topamax. Expression and sustained attention max level. Will require much care at DC, unsure of sister can provide this. May need NHP at DC from CIR  Revisions to Treatment Plan:  9/25 versus NHP    Medical Summary Current Status: Frontal headache, history of left hemiplegia due to prior right MCA stroke now with left MCA stroke Weekly Focus/Goal: Control of post stroke headache  Barriers to  Discharge: Medical stability   Possible Resolutions to Barriers: Increase Topamax dose   Continued Need for Acute Rehabilitation Level of Care: The patient requires daily medical management by a physician with specialized training in physical medicine and rehabilitation for the following reasons: Direction of a multidisciplinary physical rehabilitation program to maximize functional independence : Yes Medical management of patient stability for increased activity during participation in an intensive rehabilitation regime.: Yes Analysis of laboratory values and/or radiology reports with any subsequent need for medication adjustment and/or medical intervention. : Yes   I attest that I was present, lead the team conference, and concur with the assessment and plan of the team. Teleconference held due to COVID 19   Khambrel Amsden, Gardiner Rhyme 10/11/2018, 9:25 AM

## 2018-10-10 NOTE — Progress Notes (Signed)
Physical Therapy Session Note  Patient Details  Name: Patrick Franklin MRN: [id number] Date of Birth: 1970/12/14  Today's Date: 10/10/2018 PT Individual Time: 0277-4128 PT Individual Time Calculation (min): 75 min   Short Term Goals: Week 1:  PT Short Term Goal 1 (Week 1): Pt will increase bed mobility to mod A. PT Short Term Goal 2 (Week 1): Pt will increase transfers to mod A. PT Short Term Goal 3 (Week 1): Pt will ambulate with LRAD about 10 feet with max A.  Skilled Therapeutic Interventions/Progress Updates: Pt presented in bed agreeable to therapy. Pt stating HA which he's unable to rate per nsg recently premedicated. Pt indicated need to void, provided urinal and maxA for set up. Pt unable to void after x 5 min, removed urinal and encouraged pt to sit up EOB. Pt performed supine to sit modA to L. Pt then attempted to void in sitting (-void). Pt then indicating need for BM, performed stand pivot transfer to [place] with verbal cues for RLE placement which pt was able to follow this session (-BM). While at [facility] pt's sister arrived, pt performed STS with HW from [facility] with modA and PTA provided maxA for LB clothing management. Pt initially attempting to pivot back to bed but was able to re-direct pt to w/c. Pt then transported to rehab gym and performed STS in parallel bars and pt ambulated x 68f in bars with turn with min/modA. [name] movement was more automatic this session and demonstrated improved sequencing vs previous session. Due to improvement attempted ambulation with HW and w/c follow. Pt was able to ambulate 232fwith modA. PTA did not provide cues and pt was able to sequence step to gait pattern. Provided assist if pt's L step length was too long and noted decreased stability. Pt returned to room at end of session and performed stand pivot transfer to L with HW and min/modA. Pt was able to return to supine minA and left in enclosure bed at end of session with call bell within reach and  needs met. Pt noted to promptly fall asleep once in bed.   Pt noted to have some inappropriate activity this session as requested to masturbate after attempting to void, pt however was easily re-directed.      Therapy Documentation Precautions:  Precautions Precautions: Fall Precaution Comments: Lt hemi + Lt hemianopsia at baseline Restrictions Weight Bearing Restrictions: No General:   Vital Signs:    Therapy/Group: Individual Therapy  Zahira Brummond  Chantay Whitelock, PTA  10/10/2018, 12:39 PM

## 2018-10-10 NOTE — Progress Notes (Signed)
Speech Language Pathology Daily Session Note  Patient Details  Name: Patrick Franklin MRN: [id number] Date of Birth: August 08, 1970  Today's Date: 10/10/2018 SLP Individual Time: 8099-8338 SLP Individual Time Calculation (min): 59 min  Short Term Goals: Week 1: SLP Short Term Goal 1 (Week 1): Patient will express wants and needs with min A verbal cues. SLP Short Term Goal 2 (Week 1): Patient will attend to basic, familiar task for 5 minutes with max verbal cues for redirection. SLP Short Term Goal 3 (Week 1): Patient will complete basic, familiar tasks with max a verbal cues for functional problem sovling. SLP Short Term Goal 4 (Week 1): Patient will follow basic commands with mod A verbal cues.  Skilled Therapeutic Interventions: Pt was seen for skilled ST services focused on cognitive goals. Pt was sleeping upon therapist's arrival, however easily aroused with soft voice. Pt was oriented X3 (hospital, name, situation), Mod A verbal cues provided to orient to month. He did, however, provide incorrect birth month (said "October [birth year]th" instead of November 29th). Pt expressed wants and needs appropriately throughout session (ex: requested urinal, snacks, repositioning, etc.). Pt continues to be extremely distractible, sustaining attention for no longer than ~2 minute intervals and fluctuating Mod-Max A verbal cues for redirection. Pt unable to recall any details regarding familiar basic problem solving card game (WAR) from previous session; Min-Mod A verbal cues required for pt to identify higher number (out of 2 choices). Pt sorted ~40 cards by color (black or red) with overall Min A for error awareness/correction. Of note, pt occasionally made inappropriate comments about therapist's appearance and somewhat perseverative on asking therapist about marriage status/children. SLP used teach back method for use of call bell; pt demonstrated how to use it prior to end of session. Pt left in enclosed veil bed  with call bell in lap and all needs met. Continue per current plan of care.      Pain Pain Assessment Pain Scale: Faces Faces Pain Scale: No hurt  Therapy/Group: Individual Therapy  Arbutus Leas 10/10/2018, 12:31 PM

## 2018-10-10 NOTE — Progress Notes (Signed)
Occupational Therapy Session Note  Patient Details  Name: YUAN GANN MRN: [id number] Date of Birth: 08/05/1970  Today's Date: 10/10/2018 OT Individual Time: 1415-1431 OT Individual Time Calculation (min): 16 min  and Today's Date: 10/10/2018 OT Missed Time: 44 Minutes Missed Time Reason: Patient fatigue   Short Term Goals: Week 1:  OT Short Term Goal 1 (Week 1): Pt will maintain sustained attention to 1 ADL task for 30 seconds with mod vcs OT Short Term Goal 2 (Week 1): Pt will sit EOB/EOM for 20 minutes during functional activity with no more than Min A for sitting balance  Skilled Therapeutic Interventions/Progress Updates:    Upon entering the room, pt supine in enclosure bed and sleeping soundly. Multiple attempts needed in order to alert pt and then he touched head and nodded "yes" for pain medication. Pt attempting to ask therapist for blanket but falling asleep mid sentence. Pt very lethargic this session. Therapist unable to keep her alert for participation. Enclosure bed secured. All needs within reach.   Therapy Documentation Precautions:  Precautions Precautions: Fall Precaution Comments: Lt hemi + Lt hemianopsia at baseline Restrictions Weight Bearing Restrictions: No General: General OT Amount of Missed Time: 44 Minutes Vital Signs:   Pain: Pain Assessment Pain Scale: Faces Faces Pain Scale: No hurt ADL: ADL Eating: Not assessed Grooming: Moderate assistance(combing hair) Where Assessed-Grooming: Chair Upper Body Bathing: Maximal assistance Where Assessed-Upper Body Bathing: Edge of bed Lower Body Bathing: Dependent(2 assist) Where Assessed-Lower Body Bathing: Edge of bed Upper Body Dressing: Maximal assistance Where Assessed-Upper Body Dressing: Edge of bed Lower Body Dressing: Dependent(2 assist) Where Assessed-Lower Body Dressing: Edge of bed Toileting: Dependent(using Stedy) Where Assessed-Toileting: Bedside Commode Toilet Transfer: Maximal  assistance Toilet Transfer Method: Stand pivot Science writer: Engineer, technical sales Transfer: Not assessed   Therapy/Group: Individual Therapy  Gypsy Decant 10/10/2018, 4:01 PM

## 2018-10-10 NOTE — Progress Notes (Signed)
[place] PHYSICAL MEDICINE & REHABILITATION PROGRESS NOTE   Subjective/Complaints:  Still with frontal headache, Topamax started yesterday.  Appetite is good  ROS: Limited due to cognitive/behavioral   Objective:   No results found. No results for input(s): WBC, HGB, HCT, PLT in the last 72 hours. No results for input(s): NA, K, CL, CO2, GLUCOSE, BUN, CREATININE, CALCIUM in the last 72 hours.  Intake/Output Summary (Last 24 hours) at 10/10/2018 1015 Last data filed at 10/10/2018 0817 Gross per 24 hour  Intake 540 ml  Output 200 ml  Net 340 ml     Physical Exam: Vital Signs Blood pressure 138/73, pulse 82, temperature 97.6 F (36.4 C), resp. rate 17, height 5' 10"  (1.778 m), SpO2 96 %.  Constitutional: No distress . Vital signs reviewed. HEENT: EOMI, oral membranes moist Neck: supple Cardiovascular: RRR without murmur. No JVD    Respiratory: CTA Bilaterally without wheezes or rales. Normal effort    GI: BS +, non-tender, non-distended  Neurologic: Cranial nerves II through XII intact, motor strength is 5/5 in right  deltoid, bicep, tricep, grip, hip flexor, knee extensors, ankle dorsiflexor and plantar flexor Left sie trace grip in UE, 2- prox LLE, AFO Left foot ankle  Sensory exam intact.  Musculoskeletal: Full range of motion in all 4 extremities. No joint swelling Psych: + labile   Assessment/Plan: 1. Functional deficits secondary to new posterior left MCA distribution infarct in the setting of prior right MCA infarct with chronic left hemiparesis which require 3+ hours per day of interdisciplinary therapy in a comprehensive inpatient rehab setting.  Physiatrist is providing close team supervision and 24 hour management of active medical problems listed below.  Physiatrist and rehab team continue to assess barriers to discharge/monitor patient progress toward functional and medical goals  Care Tool:  Bathing        Body parts bathed by helper: Right arm, Left  arm, Chest, Abdomen, Buttocks, Right upper leg, Left upper leg, Right lower leg, Left lower leg, Face Body parts n/a: Front perineal area   Bathing assist Assist Level: 2 Helpers     Upper Body Dressing/Undressing Upper body dressing   What is the patient wearing?: Pull over shirt    Upper body assist Assist Level: Maximal Assistance - Patient 25 - 49%    Lower Body Dressing/Undressing Lower body dressing      What is the patient wearing?: Incontinence brief     Lower body assist Assist for lower body dressing: Maximal Assistance - Patient 25 - 49%     Toileting Toileting    Toileting assist Assist for toileting: Moderate Assistance - Patient 50 - 74%     Transfers Chair/bed transfer  Transfers assist     Chair/bed transfer assist level: Maximal Assistance - Patient 25 - 49%     Locomotion Ambulation   Ambulation assist      Assist level: Maximal Assistance - Patient 25 - 49% Assistive device: No Device Max distance: 3   Walk 10 feet activity   Assist  Walk 10 feet activity did not occur: Safety/medical concerns        Walk 50 feet activity   Assist Walk 50 feet with 2 turns activity did not occur: Safety/medical concerns         Walk 150 feet activity   Assist Walk 150 feet activity did not occur: Safety/medical concerns         Walk 10 feet on uneven surface  activity   Assist Walk 10 feet  on uneven surfaces activity did not occur: Safety/medical concerns         Wheelchair     Assist Will patient use wheelchair at discharge?: Yes Type of Wheelchair: Manual Wheelchair activity did not occur: Safety/medical concerns         Wheelchair 50 feet with 2 turns activity    Assist    Wheelchair 50 feet with 2 turns activity did not occur: Safety/medical concerns       Wheelchair 150 feet activity     Assist  Wheelchair 150 feet activity did not occur: Safety/medical concerns       Blood pressure  138/73, pulse 82, temperature 97.6 F (36.4 C), resp. rate 17, height 5' 10"  (1.778 m), SpO2 96 %.  Medical Problem List and Plan: 1.Slurred speech left side gazesecondary to left MCA infarctionWernicke's area secondary to left mid ICA short segment high-grade stenosis on 10/01/2018. Patient has hx ofprior right MCA infarction as well as VP shunt for hydrocephalus CIR PT, OT, SLP  Team conference today please see physician documentation under team conference tab, met with team face-to-face to discuss problems,progress, and goals. Formulized individual treatment plan based on medical history, underlying problem and comorbidities. -continue enclosure bed for safety.  - previous level of functioning required min assist with a gait belt for ambulation short distances but because of some worsening of balance family has used transfer chair more frequently 2. Antithrombotics: -DVT/anticoagulation:SCDs. Venous Dopplers negative -antiplatelet therapy: Aspirin 81 mg daily 3. Pain Management:Tylenol as needed.  Vascular HA trial topamax, increase to twice daily 4. Mood:Provide emotional support -antipsychotic agents: N/A 5. Neuropsych: This patientis notcapable of making decisions on hisown behalf. 6. Skin/Wound Care:Routine skin checks 7. Fluids/Electrolytes/Nutrition:Routine in and outs with follow-up chemistrieson admit. 8. Seizure disorder. Vimpat 100 mg twice daily, Depakote 1000 mg twice daily. EEG negative, no evidence of seizure since admitted to rehab 9. Dysphagia. Follow-up speech therapy. Diet has been advanced to regular as of 10/04/2018. 10. Hypertension. Permissive hypertension. Patient on metoprolol 25 mg twice daily prior to admission. Resume as needed Vitals:   10/07/18 1315 10/10/18 0514  BP:  138/73  Pulse:  82  Resp:  17  Temp: 98.7 F (37.1 C) 97.6 F (36.4 C)  SpO2:    Controlled, 9/16 11.  Hyperlipidemia. Lipitor 80 mg daily 12. Neurogenic bladder. generally continent. 13. Chronic thrombocytopenia. Stable    LOS: 5 days A FACE TO FACE EVALUATION WAS PERFORMED  Charlett Blake 10/10/2018, 10:15 AM

## 2018-10-10 NOTE — Progress Notes (Signed)
Social Work Patient ID: Patrick Franklin, male   DOB: [birth year], 48 y.o.   MRN: [id number]  Spoke with sister via telephone to inform of team conference goals min-mod level of assist and target discharge 9/25. She does feel he will need to go to a facility to continue his rehab and then go from there. She will talk with pt regarding this. She visits him daily as her schedule allows. She does plan to apply for medicaid for him so he will have medicare and medicaid coverage. Pt will ned to be taken out of enclosure bed since this is a type of restraint. MD is aware and feels he no longer needs this. Will leave NH list for sister to begin looking at facilities.

## 2018-10-11 ENCOUNTER — Inpatient Hospital Stay (HOSPITAL_COMMUNITY): Payer: 59 | Admitting: Physical Therapy

## 2018-10-11 ENCOUNTER — Inpatient Hospital Stay (HOSPITAL_COMMUNITY): Payer: 59 | Admitting: Occupational Therapy

## 2018-10-11 ENCOUNTER — Inpatient Hospital Stay (HOSPITAL_COMMUNITY): Payer: 59 | Admitting: Speech Pathology

## 2018-10-11 NOTE — Progress Notes (Signed)
Physical Therapy Session Note  Patient Details  Name: RAYCE BRAHMBHATT MRN: [id number] Date of Birth: 1970/02/25  Today's Date: 10/11/2018 PT Individual Time: 0800-0900 PT Individual Time Calculation (min): 60 min   Short Term Goals: Week 1:  PT Short Term Goal 1 (Week 1): Pt will increase bed mobility to mod A. PT Short Term Goal 2 (Week 1): Pt will increase transfers to mod A. PT Short Term Goal 3 (Week 1): Pt will ambulate with LRAD about 10 feet with max A.  Skilled Therapeutic Interventions/Progress Updates:   Pt received supine in bed and agreeable to PT. Supine>sit transfer with mod assist and cues for anterior weight shift to prevent posterior LOB intially, pt then able to sit with supervision assist while PT donned Bil shoes, L AFO and pants. Throughout treatment, pt required min-mod assist for sit<>stand x10 and max cues for attention to task and UE support on HW. Stand pivot transfer to [facility] with mod assist and max cues for sequencing and safety.   Gait training in rehab gym x 60ft with HW and mod assist from PT for improved weight shift to R and advance the LLE.   PT instructed pt in object identification task while standing at high/low table. Pt unable to attend to task and highly internally distracted, requiring max cues to redirect to object naming. Able to name and reach for 1 of 3 and 1 of 4 familiar. objects on the L and R. Pt then able to identify farm animals, but then unable to identify with 3 or 4 options. Patient returned to room performed stand pivot transfer to toilet with HW and mod assist, pt left sitting in on toilet with NT present     Therapy Documentation Precautions:  Precautions Precautions: Fall Precaution Comments: Lt hemi + Lt hemianopsia at baseline Restrictions Weight Bearing Restrictions: No Pain: denies   Therapy/Group: Individual Therapy  Lorie Phenix 10/11/2018, 1:01 PM

## 2018-10-11 NOTE — Progress Notes (Signed)
Occupational Therapy Session Note  Patient Details  Name: Patrick Franklin MRN: [id number] Date of Birth: 05/07/1970  Today's Date: 10/11/2018 OT Individual Time: 1312-1406 OT Individual Time Calculation (min): 54 min    Short Term Goals: Week 1:  OT Short Term Goal 1 (Week 1): Pt will maintain sustained attention to 1 ADL task for 30 seconds with mod vcs OT Short Term Goal 2 (Week 1): Pt will sit EOB/EOM for 20 minutes during functional activity with no more than Min A for sitting balance  Skilled Therapeutic Interventions/Progress Updates:    Upon entering the room, pt in bed with HOB elevated and NT present in the room. Pt having just started on lunch. OT providing supervision with min cuing for safety and OT limiting distractions in the room. Pt still internally distracted and needing cuing to attend to task. Pt falling asleep after eating half of meal and therefore food removed for safety. Pt performed supine >sit with mod A to EOB. Stand pivot transfer onto drop arm commode chair with mod A overall. Pt needing assistance with LB clothing management but able to perform hygiene while seated. Pt returning to bed with close supervision for sitting balance while therapist removed shoes and AFO. Sit >supine with total A and pt sensitive to touch on L foot. Call bell and all needed items within reach and bed alarm activated. Max multimodal cuing needed throughout session to sequence tasks.   Therapy Documentation Precautions:  Precautions Precautions: Fall Precaution Comments: Lt hemi + Lt hemianopsia at baseline Restrictions Weight Bearing Restrictions: No Pain: Pain Assessment Pain Scale: Faces Faces Pain Scale: No hurt ADL: ADL Eating: Not assessed Grooming: Moderate assistance(combing hair) Where Assessed-Grooming: Chair Upper Body Bathing: Maximal assistance Where Assessed-Upper Body Bathing: Edge of bed Lower Body Bathing: Dependent(2 assist) Where Assessed-Lower Body  Bathing: Edge of bed Upper Body Dressing: Maximal assistance Where Assessed-Upper Body Dressing: Edge of bed Lower Body Dressing: Dependent(2 assist) Where Assessed-Lower Body Dressing: Edge of bed Toileting: Dependent(using Stedy) Where Assessed-Toileting: Bedside Commode Toilet Transfer: Maximal assistance Toilet Transfer Method: Stand pivot Science writer: Engineer, technical sales Transfer: Not assessed   Therapy/Group: Individual Therapy  Gypsy Decant 10/11/2018, 2:10 PM

## 2018-10-11 NOTE — Progress Notes (Signed)
Speech Language Pathology Daily Session Note  Patient Details  Name: Patrick Franklin MRN: [id number] Date of Birth: 12-Jun-1970  Today's Date: 10/11/2018 SLP Individual Time: 6948-5462 SLP Individual Time Calculation (min): 57 min  Short Term Goals: Week 1: SLP Short Term Goal 1 (Week 1): Patient will express wants and needs with min A verbal cues. SLP Short Term Goal 2 (Week 1): Patient will attend to basic, familiar task for 5 minutes with max verbal cues for redirection. SLP Short Term Goal 3 (Week 1): Patient will complete basic, familiar tasks with max a verbal cues for functional problem sovling. SLP Short Term Goal 4 (Week 1): Patient will follow basic commands with mod A verbal cues.  Skilled Therapeutic Interventions: Pt was seen for skilled ST services focused on cognitive goals. After SLP provided 1-2 models, pt matched colored blocks to a colored grid (up to 9 color pattern) with overall Min A verbal cues for error awareness, Mod A for initiation throughout task. Pt required Mod-Max A for focused and sustained attention. Pt sustained attention for ~2-3 minute intervals during tasks he enjoyed (slightly improved over yesterday). Pt also demonstrated recall of instructions for familiar card game, WAR, and identified higher number (from field of 2) with ~60% accuracy and Mod A verbal cues for problem solving. Pt followed 1-step directions to turn on his TV and find a preferred channel with Mod-Max A verbal and visual instruction from SLP. Pt expressed wants/needs throughout session with intermittent Min A, although some of his interjected comments were inappropriate/off topic. Pt was left in bed with alarm set, all needs within reach. Continue per current plan of care.      Pain Pain Assessment Pain Scale: Faces Pain Score: 0-No pain Faces Pain Scale: No hurt  Therapy/Group: Individual Therapy  Arbutus Leas 10/11/2018, 10:51 AM

## 2018-10-11 NOTE — Plan of Care (Signed)
°  Problem: Consults Goal: RH STROKE PATIENT EDUCATION Description: See Patient Education module for education specifics  Outcome: Progressing   Problem: RH BLADDER ELIMINATION Goal: RH STG MANAGE BLADDER WITH ASSISTANCE Description: STG Manage Bladder With min Assistance Outcome: Progressing   Problem: RH SAFETY Goal: RH STG ADHERE TO SAFETY PRECAUTIONS W/ASSISTANCE/DEVICE Description: STG Adhere to Safety Precautions With mod/max Assistance/Device. Outcome: Progressing   Problem: RH COGNITION-NURSING Goal: RH STG USES MEMORY AIDS/STRATEGIES W/ASSIST TO PROBLEM SOLVE Description: STG Uses Memory Aids/Strategies With mod Assistance to Problem Solve. Outcome: Progressing   Problem: RH PAIN MANAGEMENT Goal: RH STG PAIN MANAGED AT OR BELOW [name] PAIN GOAL Description: < 4 Outcome: Progressing   Problem: RH KNOWLEDGE DEFICIT Goal: RH STG INCREASE KNOWLEDGE OF HYPERTENSION Description: Patient/caregiver will verbalize understanding of HTN including diet, exercise, medications, monitoring, and follow up care with mod assist. Outcome: Progressing Goal: RH STG INCREASE KNOWLEGDE OF HYPERLIPIDEMIA Description: Patient/caregiver will verbalize understanding of HLD including diet, exercise, medications, monitoring, and follow up care with mod assist. Outcome: Progressing Goal: RH STG INCREASE KNOWLEDGE OF STROKE PROPHYLAXIS Description: Patient/caregiver will verbalize understanding of stroke prophylaxis including diet, exercise, medications, monitoring, and follow up care with mod assist. Outcome: Progressing

## 2018-10-11 NOTE — Progress Notes (Signed)
[place] PHYSICAL MEDICINE & REHABILITATION PROGRESS NOTE   Subjective/Complaints:  No new issues overnight, remains confused, discussed with physical therapy.  Difficult to keep on track for more than short periods of time  ROS: Limited due to cognitive/behavioral   Objective:   No results found. No results for input(s): WBC, HGB, HCT, PLT in the last 72 hours. No results for input(s): NA, K, CL, CO2, GLUCOSE, BUN, CREATININE, CALCIUM in the last 72 hours.  Intake/Output Summary (Last 24 hours) at 10/11/2018 1113 Last data filed at 10/11/2018 0800 Gross per 24 hour  Intake 420 ml  Output 400 ml  Net 20 ml     Physical Exam: Vital Signs Blood pressure 138/73, pulse 82, temperature 97.6 F (36.4 C), resp. rate 17, height 5\' 10"  (1.778 m), weight 83.6 kg, SpO2 96 %.  Constitutional: No distress . Vital signs reviewed. HEENT: EOMI, oral membranes moist Neck: supple Cardiovascular: RRR without murmur. No JVD    Respiratory: CTA Bilaterally without wheezes or rales. Normal effort    GI: BS +, non-tender, non-distended  Neurologic: Cranial nerves II through XII intact, motor strength is difficult to assess today because of poor focus on task s Musculoskeletal: Full range of motion in all 4 extremities. No joint swelling Psych: + labile   Assessment/Plan: 1. Functional deficits secondary to new posterior left MCA distribution infarct in the setting of prior right MCA infarct with chronic left hemiparesis which require 3+ hours per day of interdisciplinary therapy in a comprehensive inpatient rehab setting.  Physiatrist is providing close team supervision and 24 hour management of active medical problems listed below.  Physiatrist and rehab team continue to assess barriers to discharge/monitor patient progress toward functional and medical goals  Care Tool:  Bathing        Body parts bathed by helper: Right arm, Left arm, Chest, Abdomen, Buttocks, Right upper leg, Left  upper leg, Right lower leg, Left lower leg, Face Body parts n/a: Front perineal area   Bathing assist Assist Level: 2 Helpers     Upper Body Dressing/Undressing Upper body dressing   What is the patient wearing?: Pull over shirt    Upper body assist Assist Level: Maximal Assistance - Patient 25 - 49%    Lower Body Dressing/Undressing Lower body dressing      What is the patient wearing?: Incontinence brief     Lower body assist Assist for lower body dressing: Maximal Assistance - Patient 25 - 49%     Toileting Toileting    Toileting assist Assist for toileting: Moderate Assistance - Patient 50 - 74%     Transfers Chair/bed transfer  Transfers assist     Chair/bed transfer assist level: Maximal Assistance - Patient 25 - 49%     Locomotion Ambulation   Ambulation assist      Assist level: Maximal Assistance - Patient 25 - 49% Assistive device: No Device Max distance: 3   Walk 10 feet activity   Assist  Walk 10 feet activity did not occur: Safety/medical concerns        Walk 50 feet activity   Assist Walk 50 feet with 2 turns activity did not occur: Safety/medical concerns         Walk 150 feet activity   Assist Walk 150 feet activity did not occur: Safety/medical concerns         Walk 10 feet on uneven surface  activity   Assist Walk 10 feet on uneven surfaces activity did not occur: Safety/medical concerns  Wheelchair     Assist Will patient use wheelchair at discharge?: Yes Type of Wheelchair: Manual Wheelchair activity did not occur: Safety/medical concerns         Wheelchair 50 feet with 2 turns activity    Assist    Wheelchair 50 feet with 2 turns activity did not occur: Safety/medical concerns       Wheelchair 150 feet activity     Assist  Wheelchair 150 feet activity did not occur: Safety/medical concerns       Blood pressure 138/73, pulse 82, temperature 97.6 F (36.4 C), resp. rate  17, height 5\' 10"  (1.778 m), weight 83.6 kg, SpO2 96 %.  Medical Problem List and Plan: 1.Slurred speech left side gazesecondary to left MCA infarctionWernicke's area secondary to left mid ICA short segment high-grade stenosis on 10/01/2018. Patient has hx ofprior right MCA infarction as well as VP shunt for hydrocephalus CIR PT, OT, SLP   -continue enclosure bed for safety.  - previous level of functioning required min assist with a gait belt for ambulation short distances but because of some worsening of balance family has used transfer chair more frequently 2. Antithrombotics: -DVT/anticoagulation:SCDs. Venous Dopplers negative -antiplatelet therapy: Aspirin 81 mg daily 3. Pain Management:Tylenol as needed.  Vascular HA trial topamax, increase to twice daily 4. Mood:Provide emotional support -antipsychotic agents: N/A 5. Neuropsych: This patientis notcapable of making decisions on hisown behalf. 6. Skin/Wound Care:Routine skin checks 7. Fluids/Electrolytes/Nutrition:Routine in and outs with follow-up chemistrieson admit. 8. Seizure disorder. Vimpat 100 mg twice daily, Depakote 1000 mg twice daily. EEG negative, no evidence of seizure since admitted to rehab 9. Dysphagia. Follow-up speech therapy. Diet has been advanced to regular as of 10/04/2018. 10. Hypertension. Permissive hypertension. Patient on metoprolol 25 mg twice daily prior to admission. Resume as needed Vitals:   10/07/18 1315 10/10/18 0514  BP:  138/73  Pulse:  82  Resp:  17  Temp: 98.7 F (37.1 C) 97.6 F (36.4 C)  SpO2:    Controlled, 9/17 11. Hyperlipidemia. Lipitor 80 mg daily 12. Neurogenic bladder. generally continent. 13. Chronic thrombocytopenia. Stable    LOS: 6 days A FACE TO FACE EVALUATION WAS PERFORMED  Patrick Franklin 10/11/2018, 11:13 AM

## 2018-10-11 NOTE — Progress Notes (Signed)
*  Late Entry*   Patient progressed out of enclosure bed to regular Hil-Rom bed with no issues. Floor mat placed on each side of bed.

## 2018-10-12 ENCOUNTER — Inpatient Hospital Stay (HOSPITAL_COMMUNITY): Payer: 59 | Admitting: Physical Therapy

## 2018-10-12 ENCOUNTER — Inpatient Hospital Stay (HOSPITAL_COMMUNITY): Payer: 59 | Admitting: Speech Pathology

## 2018-10-12 ENCOUNTER — Inpatient Hospital Stay (HOSPITAL_COMMUNITY): Payer: 59 | Admitting: Occupational Therapy

## 2018-10-12 NOTE — Progress Notes (Signed)
Occupational Therapy Session Note  Patient Details  Name: Patrick Franklin MRN: [id number] Date of Birth: 17-Jun-1970  Today's Date: 10/12/2018 OT Individual Time: 1403-1500 OT Individual Time Calculation (min): 57 min   Short Term Goals: Week 1:  OT Short Term Goal 1 (Week 1): Pt will maintain sustained attention to 1 ADL task for 30 seconds with mod vcs OT Short Term Goal 2 (Week 1): Pt will sit EOB/EOM for 20 minutes during functional activity with no more than Min A for sitting balance  Skilled Therapeutic Interventions/Progress Updates:    Pt greeted in bed, asleep, and awakened via sternal rub. Agreeable to tx and requesting to drink some water. Supine<sit from flat bed completed with Mod-Max A. Mod A stand pivot<w/c using hemi walker with manual facilitation for L LE placement prior to stand<sit. Started with rinsing mouth, oral care, handwashing, and hair washing (using shower cap) w/c level at the sink. Focus placed on self sequencing, attention to task, and general awareness. Pt initiated use of one handed techniques. Provided passive stretching to L UE at this time, focusing on wrist and digit extension due to hypertonicity. Mod multimodal cues overall for executing stated tasks. He then stated that his brief was wet, so stand pivot<BSC completed in manner as written above. Noted pt initiates stand<sit before reaching transfer surfaces. Pt reported he needed to have a BM and did not need to use the urinal. Pt spent a few minutes passing gas and then started voiding bladder. Urine collected in BSC bucket but also leaked onto floor. Assist required for urinal placement to continue voiding. Pt stood with Min A for balance during perihygiene completion. Total A for donning clean brief and doffing/donning pants. Stand pivot<bed completed with Mod A in manner as written above. Mod A for returning to supine where pt was repositioned for comfort. Left him with all needs within reach, L UE  supported, and bed alarm set. Tx focus placed on ADL retraining, standing balance, and functional transfers (wearing Lt sublux brace and Lt AFO).    Pt exhibited sexually inappropriate behaviors during tx but easily redirected to task. Pt still states that he is in [place] when asked and unable to verbalize understanding when given correct orientation information.   Therapy Documentation Precautions:  Precautions Precautions: Fall Precaution Comments: Lt hemi + Lt hemianopsia at baseline Restrictions Weight Bearing Restrictions: No Pain: Pt c/o HA with RN in to provide medicine to address during session    ADL: ADL Eating: Not assessed Grooming: Moderate assistance(combing hair) Where Assessed-Grooming: Chair Upper Body Bathing: Maximal assistance Where Assessed-Upper Body Bathing: Edge of bed Lower Body Bathing: Dependent(2 assist) Where Assessed-Lower Body Bathing: Edge of bed Upper Body Dressing: Maximal assistance Where Assessed-Upper Body Dressing: Edge of bed Lower Body Dressing: Dependent(2 assist) Where Assessed-Lower Body Dressing: Edge of bed Toileting: Dependent(using Stedy) Where Assessed-Toileting: Bedside Commode Toilet Transfer: Maximal assistance Toilet Transfer Method: Stand pivot Acupuncturist: Animator Transfer: Not assessed      Therapy/Group: Individual Therapy  Lucina Betty A Wendee Hata 10/12/2018, 4:31 PM

## 2018-10-12 NOTE — Progress Notes (Signed)
[facility] PHYSICAL MEDICINE & REHABILITATION PROGRESS NOTE   Subjective/Complaints: Orlene Erm remains off topic, no c/os  ROS: Limited due to cognitive/behavioral   Objective:   No results found. No results for input(s): WBC, HGB, HCT, PLT in the last 72 hours. No results for input(s): NA, K, CL, CO2, GLUCOSE, BUN, CREATININE, CALCIUM in the last 72 hours.  Intake/Output Summary (Last 24 hours) at 10/12/2018 0831 Last data filed at 10/12/2018 0800 Gross per 24 hour  Intake 120 ml  Output 1450 ml  Net -1330 ml     Physical Exam: Vital Signs Blood pressure 138/73, pulse 82, temperature 97.6 F (36.4 C), resp. rate 17, height 5\' 10"  (1.778 m), weight 83.6 kg, SpO2 96 %.  Constitutional: No distress . Vital signs reviewed. HEENT: EOMI, oral membranes moist Neck: supple Cardiovascular: RRR without murmur. No JVD    Respiratory: CTA Bilaterally without wheezes or rales. Normal effort    GI: BS +, non-tender, non-distended  Neurologic: Cranial nerves II through XII intact, motor strength is difficult to assess today because of poor focus on task s Musculoskeletal: Full range of motion in all 4 extremities. No joint swelling Psych: confused but pleasant   Assessment/Plan: 1. Functional deficits secondary to new posterior left MCA distribution infarct in the setting of prior right MCA infarct with chronic left hemiparesis which require 3+ hours per day of interdisciplinary therapy in a comprehensive inpatient rehab setting.  Physiatrist is providing close team supervision and 24 hour management of active medical problems listed below.  Physiatrist and rehab team continue to assess barriers to discharge/monitor patient progress toward functional and medical goals  Care Tool:  Bathing        Body parts bathed by helper: Right arm, Left arm, Chest, Abdomen, Buttocks, Right upper leg, Left upper leg, Right lower leg, Left lower leg, Face Body parts n/a: Front perineal area    Bathing assist Assist Level: 2 Helpers     Upper Body Dressing/Undressing Upper body dressing   What is the patient wearing?: Pull over shirt    Upper body assist Assist Level: Maximal Assistance - Patient 25 - 49%    Lower Body Dressing/Undressing Lower body dressing      What is the patient wearing?: Incontinence brief     Lower body assist Assist for lower body dressing: Maximal Assistance - Patient 25 - 49%     Toileting Toileting    Toileting assist Assist for toileting: Moderate Assistance - Patient 50 - 74%     Transfers Chair/bed transfer  Transfers assist     Chair/bed transfer assist level: Moderate Assistance - Patient 50 - 74%     Locomotion Ambulation   Ambulation assist      Assist level: Moderate Assistance - Patient 50 - 74% Assistive device: Walker-hemi Max distance: 20   Walk 10 feet activity   Assist  Walk 10 feet activity did not occur: Safety/medical concerns  Assist level: Moderate Assistance - Patient - 50 - 74%     Walk 50 feet activity   Assist Walk 50 feet with 2 turns activity did not occur: Safety/medical concerns         Walk 150 feet activity   Assist Walk 150 feet activity did not occur: Safety/medical concerns         Walk 10 feet on uneven surface  activity   Assist Walk 10 feet on uneven surfaces activity did not occur: Safety/medical concerns         Wheelchair  Assist Will patient use wheelchair at discharge?: Yes Type of Wheelchair: Manual Wheelchair activity did not occur: Safety/medical concerns         Wheelchair 50 feet with 2 turns activity    Assist    Wheelchair 50 feet with 2 turns activity did not occur: Safety/medical concerns       Wheelchair 150 feet activity     Assist  Wheelchair 150 feet activity did not occur: Safety/medical concerns       Blood pressure 138/73, pulse 82, temperature 97.6 F (36.4 C), resp. rate 17, height 5\' 10"  (1.778 m),  weight 83.6 kg, SpO2 96 %.  Medical Problem List and Plan: 1.Slurred speech left side gazesecondary to left MCA infarctionWernicke's area secondary to left mid ICA short segment high-grade stenosis on 10/01/2018. Patient has hx ofprior right MCA infarction as well as VP shunt for hydrocephalus CIR PT, OT, SLP   -out off enclosure bed .  - previous level of functioning required min assist with a gait belt for ambulation short distances but because of some worsening of balance family has used transfer chair more frequently 2. Antithrombotics: -DVT/anticoagulation:SCDs. Venous Dopplers negative -antiplatelet therapy: Aspirin 81 mg daily 3. Pain Management:Tylenol as needed.  Vascular HA trial topamax, increase to twice daily 4. Mood:Provide emotional support -antipsychotic agents: N/A 5. Neuropsych: This patientis notcapable of making decisions on hisown behalf. 6. Skin/Wound Care:Routine skin checks 7. Fluids/Electrolytes/Nutrition:Routine in and outs with follow-up chemistrieson admit. 8. Seizure disorder. Vimpat 100 mg twice daily, Depakote 1000 mg twice daily. EEG negative, no evidence of seizures since admitted to rehab 9. Dysphagia. Follow-up speech therapy. Diet has been advanced to regular as of 10/04/2018. 10. Hypertension. Permissive hypertension. Patient on metoprolol 25 mg twice daily prior to admission. Resume as needed Vitals:   10/07/18 1315 10/10/18 0514  BP:  138/73  Pulse:  82  Resp:  17  Temp: 98.7 F (37.1 C) 97.6 F (36.4 C)  SpO2:    Controlled, 9/18 11. Hyperlipidemia. Lipitor 80 mg daily 12. Neurogenic bladder. generally continent. 13. Chronic thrombocytopenia. Stable    LOS: 7 days A FACE TO FACE EVALUATION WAS PERFORMED  Erick Colace 10/12/2018, 8:31 AM

## 2018-10-12 NOTE — Progress Notes (Signed)
Physical Therapy Weekly Progress Note  Patient Details  Name: Patrick Franklin MRN: [id number] Date of Birth: 04/07/1970  Beginning of progress report period: October 06, 2018 End of progress report period: October 12, 2018  Today's Date: 10/12/2018 PT Individual Time: 0800-0900 PT Individual Time Calculation (min): 60 min   Patient has met 3 of 3 short term goals.  Pt remains confused and excessively internally distracted, but has improved functionally to mod assist overall with L AFO for transfers, short bouts of gait, and bed mobility. Visual deficits and difficulty sequencing in conjunction with confusion limit increased progress as this time.   Patient continues to demonstrate the following deficits muscle weakness, muscle joint tightness and muscle paralysis, decreased cardiorespiratoy endurance, impaired timing and sequencing, abnormal tone, unbalanced muscle activation, motor apraxia, ataxia, decreased coordination and decreased motor planning, decreased visual acuity and decreased visual perceptual skills, decreased attention to left, left side neglect and decreased motor planning, decreased initiation, decreased attention, decreased awareness, decreased problem solving, decreased safety awareness, decreased memory and delayed processing and decreased sitting balance, decreased standing balance, decreased postural control, hemiplegia and decreased balance strategies and therefore will continue to benefit from skilled PT intervention to increase functional independence with mobility.  Patient progressing toward long term goals..  Continue plan of care.  PT Short Term Goals Week 1:  PT Short Term Goal 1 (Week 1): Pt will increase bed mobility to mod A. PT Short Term Goal 1 - Progress (Week 1): Met PT Short Term Goal 2 (Week 1): Pt will increase transfers to mod A. PT Short Term Goal 2 - Progress (Week 1): Met PT Short Term Goal 3 (Week 1): Pt will ambulate with LRAD about 10 feet  with max A. PT Short Term Goal 3 - Progress (Week 1): Met Week 2:  PT Short Term Goal 1 (Week 2): STG=LTG due to ELOS  Skilled Therapeutic Interventions/Progress Updates:  Pt received supine in bed and agreeable to PT. Supine>sit transfer with mod assist at LLE and trunk with cues for anterior weight shift to prevent posterior LOB. PT assisted pt to don shirt, and LAFO while sitting EOB with total A for clothing management. Stand pivot transfer to [facility] with mod assist and HW.   Sustained attention training with [employer], Sitting: Program A 1 ring, 19, 2 rings: 21. Standing: 2 rings 23 and 24. Pt required moderate cues for attention to task, pt able to sustain attention ~ 20sec and then increased to ~40sec  Visual scanning and sustained attention task to catch and throw ball back and fourth to PT. Pt required cues for visual scanning to the L and to continue to engage in task, as pt very internally distracted  Gait training in rehab gym with HW x 37ft with mod assist for weight shifting R to allow advancement of the LLE. Moderate cues for sequencing of gait pattern and turn to sit in in arm chair. Stand pivot transfer to [facility] with HW and Mod assist for AD management and improved weight shifting to clear the LLE.   Patient returned to room and left sitting in [facility] with call bell in reach and all needs met.         Therapy Documentation Precautions:  Precautions Precautions: Fall Precaution Comments: Lt hemi + Lt hemianopsia at baseline Restrictions Weight Bearing Restrictions: No Pain: denies  Therapy/Group: Individual Therapy  Golden Pop 10/12/2018, 9:02 AM

## 2018-10-12 NOTE — Plan of Care (Signed)
°  Problem: Consults °Goal: RH STROKE PATIENT EDUCATION °Description: See Patient Education module for education specifics  °Outcome: Progressing °  °Problem: RH BLADDER ELIMINATION °Goal: RH STG MANAGE BLADDER WITH ASSISTANCE °Description: STG Manage Bladder With min Assistance °Outcome: Progressing °  °Problem: RH SAFETY °Goal: RH STG ADHERE TO SAFETY PRECAUTIONS W/ASSISTANCE/DEVICE °Description: STG Adhere to Safety Precautions With mod/max Assistance/Device. °Outcome: Progressing °  °Problem: RH COGNITION-NURSING °Goal: RH STG USES MEMORY AIDS/STRATEGIES W/ASSIST TO PROBLEM SOLVE °Description: STG Uses Memory Aids/Strategies With mod Assistance to Problem Solve. °Outcome: Progressing °  °Problem: RH PAIN MANAGEMENT °Goal: RH STG PAIN MANAGED AT OR BELOW [name] PAIN GOAL °Description: < 4 °Outcome: Progressing °  °Problem: RH KNOWLEDGE DEFICIT °Goal: RH STG INCREASE KNOWLEDGE OF HYPERTENSION °Description: Patient/caregiver will verbalize understanding of HTN including diet, exercise, medications, monitoring, and follow up care with mod assist. °Outcome: Progressing °Goal: RH STG INCREASE KNOWLEGDE OF HYPERLIPIDEMIA °Description: Patient/caregiver will verbalize understanding of HLD including diet, exercise, medications, monitoring, and follow up care with mod assist. °Outcome: Progressing °Goal: RH STG INCREASE KNOWLEDGE OF STROKE PROPHYLAXIS °Description: Patient/caregiver will verbalize understanding of stroke prophylaxis including diet, exercise, medications, monitoring, and follow up care with mod assist. °Outcome: Progressing °  °

## 2018-10-12 NOTE — Progress Notes (Signed)
Speech Language Pathology Weekly Progress and Session Note  Patient Details  Name: Patrick Franklin MRN: [id number] Date of Birth: [birth year]  Beginning of progress report period: October 05, 2018 End of progress report period: October 12, 2018  Today's Date: 10/12/2018 SLP Individual Time: 1000-1045 SLP Individual Time Calculation (min): 45 min  Short Term Goals: Week 1: SLP Short Term Goal 1 (Week 1): Patient will express wants and needs with min A verbal cues. SLP Short Term Goal 1 - Progress (Week 1): Met SLP Short Term Goal 2 (Week 1): Patient will attend to basic, familiar task for 5 minutes with max verbal cues for redirection. SLP Short Term Goal 2 - Progress (Week 1): Progressing toward goal SLP Short Term Goal 3 (Week 1): Patient will complete basic, familiar tasks with max a verbal cues for functional problem sovling. SLP Short Term Goal 3 - Progress (Week 1): Met SLP Short Term Goal 4 (Week 1): Patient will follow basic commands with mod A verbal cues. SLP Short Term Goal 4 - Progress (Week 1): Progressing toward goal    New Short Term Goals: Week 2: SLP Short Term Goal 1 (Week 2): STG = LTG due to LOS  Weekly Progress Updates: Pt has made minimal functional gains and met 2 out of 4 short term goals this reporting period. Pt is currently Max assist for cognition, Min A for expressing wants/needs due to severe cognitive impairments. Pt has demonstrated improved basic problem solving during familiar tasks and expressing basic needs. Pt and family education is ongoing. Pt would continue to benefit from skilled ST while inpatient in order to maximize functional independence and reduce burden of care prior to discharge. Anticipate that pt will need 24/7 supervision at discharge in addition to [place] follow up at next level of care.     Intensity: Minumum of 1-2 x/day, 30 to 90 minutes Frequency: 3 to 5 out of 7 days Duration/Length of Stay: 9/25 (or pending  SNF) Treatment/Interventions: Cognitive remediation/compensation;Therapeutic Activities;Therapeutic Exercise;Internal/external aids;Patient/family education;Cueing hierarchy;Functional tasks;Speech/Language facilitation   Daily Session  Skilled Therapeutic Interventions: Pt was seen for skilled ST targeting cognitive goals. Pt was extremely labile upon therapist's. His comments remain mostly off topic and pt directed. With Max a for redirection and Min A for identifying wants and needs, pt able to state he wanted something to drink. Mod A verbal cues provided for basic functional problem solving how to get more drink from can into cup to drink from straw. Despite attempts to engage pt is new activities, pt only agreeable to participate in familiar color block pattern matching activity. He matched colors to a grid (up to a 9 color pattern) with overall Min A for basic problem solving, Max A for awareness and correction of 2 errors. Pt demonstrated increased fatigue throughout session and eventually could not keep his eyes open for more than 2-3 seconds despite Max multimodal cues for arousal and encouragement. As a result, pt missed 15 minutes of skilled ST. Pt was left in bed with alarm set, call bell within reach and all needs met. Continue per current plan of care.      Pain Pain Assessment Pain Score: 0-No pain  Therapy/Group: Individual Therapy  Arbutus Leas 10/12/2018, 10:47 AM

## 2018-10-13 ENCOUNTER — Inpatient Hospital Stay (HOSPITAL_COMMUNITY): Payer: 59 | Admitting: Occupational Therapy

## 2018-10-13 ENCOUNTER — Inpatient Hospital Stay (HOSPITAL_COMMUNITY): Payer: 59 | Admitting: Physical Therapy

## 2018-10-13 DIAGNOSIS — R030 Elevated blood-pressure reading, without diagnosis of hypertension: Secondary | ICD-10-CM

## 2018-10-13 DIAGNOSIS — G441 Vascular headache, not elsewhere classified: Secondary | ICD-10-CM

## 2018-10-13 DIAGNOSIS — R569 Unspecified convulsions: Secondary | ICD-10-CM

## 2018-10-13 DIAGNOSIS — D696 Thrombocytopenia, unspecified: Secondary | ICD-10-CM

## 2018-10-13 NOTE — Progress Notes (Signed)
Occupational Therapy Session Note  Patient Details  Name: Patrick Franklin MRN: [id number] Date of Birth: [birth year]  Today's Date: 10/13/2018 OT Individual Time: 1100-1200 OT Individual Time Calculation (min): 60 min   Short Term Goals: Week 1:  OT Short Term Goal 1 (Week 1): Pt will maintain sustained attention to 1 ADL task for 30 seconds with mod vcs OT Short Term Goal 2 (Week 1): Pt will sit EOB/EOM for 20 minutes during functional activity with no more than Min A for sitting balance  Skilled Therapeutic Interventions/Progress Updates:    Pt greeted in bed with no s/s pain. Sister present to visit and had set him up with videochatting with his children. Pt verbalized that he had to use the bathroom. Supine<sit completed with Mod A and Mod A for stand pivot<BSC using hemi walker (after his shoes/Lt AFO were donned). Note pt still offshoots location of transfer surface and needed BSC pulled up behind him.When given increased time, pt unable to void B+B. Tried to have him stand pivot<w/c to wash up at the sink, however pt initiated transfer back to bed and unable to understand multimodal cuing to move towards w/c. He washed EOB at sit<stand level with Max A. Worked on sustained attention, Lt attention, sequencing, and following 1 step multimodal instruction at this time. HOH initially for washing face, chest, and upper legs. Min-Mod A for sit<stand for perihygiene and LB dressing. During final stand, pt with significant forward leaning needing Max A to prevent fall. Increased signs of agitation with verbal escalation noted at this time. Pt asking sister who she was and after she responded he stated "You are not my sister." Pt redirected to sitting EOB to deescalate. Agreeable to allow OT to assist him back to bed after a few minutes of quiet. He was repositioned to protect hemiplegic side and left with all needs within reach and bed alarm set.   Pt still disoriented to place and inconsistently  oriented to situation.    Increased sensitivity/pain when gently washing and dressing his Lt foot. Per sister, PTA he only had sensitivity at the bottom of his foot.   Therapy Documentation Precautions:  Precautions Precautions: Fall Precaution Comments: Lt hemi + Lt hemianopsia at baseline Restrictions Weight Bearing Restrictions: No ADL: ADL Eating: Not assessed Grooming: Moderate assistance(combing hair) Where Assessed-Grooming: Chair Upper Body Bathing: Maximal assistance Where Assessed-Upper Body Bathing: Edge of bed Lower Body Bathing: Dependent(2 assist) Where Assessed-Lower Body Bathing: Edge of bed Upper Body Dressing: Maximal assistance Where Assessed-Upper Body Dressing: Edge of bed Lower Body Dressing: Dependent(2 assist) Where Assessed-Lower Body Dressing: Edge of bed Toileting: Dependent(using Stedy) Where Assessed-Toileting: Bedside Commode Toilet Transfer: Maximal assistance Toilet Transfer Method: Stand pivot Acupuncturist: Animator Transfer: Not assessed      Therapy/Group: Individual Therapy  Zonya Gudger A Teryl Mcconaghy 10/13/2018, 4:24 PM

## 2018-10-13 NOTE — Progress Notes (Signed)
[place] PHYSICAL MEDICINE & REHABILITATION PROGRESS NOTE   Subjective/Complaints: Patient seen laying in bed this morning.  No reported issues overnight.  ROS: Limited due to cognition.  Objective:   No results found. No results for input(s): WBC, HGB, HCT, PLT in the last 72 hours. No results for input(s): NA, K, CL, CO2, GLUCOSE, BUN, CREATININE, CALCIUM in the last 72 hours.  Intake/Output Summary (Last 24 hours) at 10/13/2018 0910 Last data filed at 10/13/2018 0900 Gross per 24 hour  Intake 270 ml  Output 275 ml  Net -5 ml     Physical Exam: Vital Signs Blood pressure 138/73, pulse 82, temperature 97.6 F (36.4 C), resp. rate 17, height 5\' 10"  (1.778 m), weight 83.6 kg, SpO2 96 %. Constitutional: No distress . Vital signs reviewed. Eyes: Keeps eyes closed. No discharge. Cardiovascular: No JVD. Respiratory: Normal effort.  No stridor. GI: Non-distended. Skin: Warm and dry.  Intact. Psych: Unable to assess due to cognition Musc: No edema in extremities.  No tenderness in extremities. Neurologic:  Somnolent Motor exam limited due to cognition  Assessment/Plan: 1. Functional deficits secondary to new posterior left MCA distribution infarct in the setting of prior right MCA infarct with chronic left hemiparesis which require 3+ hours per day of interdisciplinary therapy in a comprehensive inpatient rehab setting.  Physiatrist is providing close team supervision and 24 hour management of active medical problems listed below.  Physiatrist and rehab team continue to assess barriers to discharge/monitor patient progress toward functional and medical goals  Care Tool:  Bathing        Body parts bathed by helper: Right arm, Left arm, Chest, Abdomen, Buttocks, Right upper leg, Left upper leg, Right lower leg, Left lower leg, Face Body parts n/a: Front perineal area   Bathing assist Assist Level: 2 Helpers     Upper Body Dressing/Undressing Upper body dressing    What is the patient wearing?: Pull over shirt    Upper body assist Assist Level: Maximal Assistance - Patient 25 - 49%    Lower Body Dressing/Undressing Lower body dressing      What is the patient wearing?: Incontinence brief     Lower body assist Assist for lower body dressing: Total Assistance - Patient < 25%     Toileting Toileting    Toileting assist Assist for toileting: Maximal Assistance - Patient 25 - 49%     Transfers Chair/bed transfer  Transfers assist     Chair/bed transfer assist level: Moderate Assistance - Patient 50 - 74%     Locomotion Ambulation   Ambulation assist      Assist level: Moderate Assistance - Patient 50 - 74% Assistive device: Walker-hemi Max distance: 20   Walk 10 feet activity   Assist  Walk 10 feet activity did not occur: Safety/medical concerns  Assist level: Moderate Assistance - Patient - 50 - 74% Assistive device: Walker-hemi   Walk 50 feet activity   Assist Walk 50 feet with 2 turns activity did not occur: Safety/medical concerns         Walk 150 feet activity   Assist Walk 150 feet activity did not occur: Safety/medical concerns         Walk 10 feet on uneven surface  activity   Assist Walk 10 feet on uneven surfaces activity did not occur: Safety/medical concerns         Wheelchair     Assist Will patient use wheelchair at discharge?: Yes Type of Wheelchair: Manual Wheelchair activity did not  occur: Safety/medical concerns         Wheelchair 50 feet with 2 turns activity    Assist    Wheelchair 50 feet with 2 turns activity did not occur: Safety/medical concerns       Wheelchair 150 feet activity     Assist  Wheelchair 150 feet activity did not occur: Safety/medical concerns       Blood pressure 138/73, pulse 82, temperature 97.6 F (36.4 C), resp. rate 17, height 5\' 10"  (1.778 m), weight 83.6 kg, SpO2 96 %.  Medical Problem List and Plan: 1.Slurred  speech left side gazesecondary to left MCA infarctionWernicke's area secondary to left mid ICA short segment high-grade stenosis on 10/01/2018. Patient has hx ofprior right MCA infarction as well as VP shunt for hydrocephalus  Continue CIR 2. Antithrombotics: -DVT/anticoagulation:SCDs. Venous Dopplers negative -antiplatelet therapy: Aspirin 81 mg daily 3. Pain Management:Tylenol as needed.    Topamax increased to twice daily for headaches. 4. Mood:Provide emotional support -antipsychotic agents: N/A 5. Neuropsych: This patientis notcapable of making decisions on hisown behalf. 6. Skin/Wound Care:Routine skin checks 7. Fluids/Electrolytes/Nutrition:Routine I/Os.    BMP within acceptable range on 9/10, labs ordered for Monday 8. Seizure disorder. Vimpat 100 mg twice daily, Depakote 1000 mg twice daily. EEG negative  No seizures since admission to rehab- 9/19 9. Post stroke dysphagia. Follow-up speech therapy. Diet has been advanced to regular as of 10/04/2018. 10. Hypertension.  Patient on metoprolol 25 mg twice daily prior to admission. Resume as needed Vitals:   10/07/18 1315 10/10/18 0514  BP:  138/73  Pulse:  82  Resp:  17  Temp: 98.7 F (37.1 C) 97.6 F (36.4 C)  SpO2:     Slightly elevated diastolic pressures 11. Hyperlipidemia. Lipitor 80 mg daily 12. Neurogenic bladder. generally continent. 13. Chronic thrombocytopenia.   Platelets 68 on 9/10, labs ordered for Monday    LOS: 8 days A FACE TO FACE EVALUATION WAS PERFORMED  Patrick Franklin Patrick Franklin 10/13/2018, 9:10 AM
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
## 2018-10-14 ENCOUNTER — Inpatient Hospital Stay (HOSPITAL_COMMUNITY): Payer: 59 | Admitting: Occupational Therapy

## 2018-10-14 NOTE — Progress Notes (Signed)
Occupational Therapy Weekly Progress Note  Patient Details  Name: Patrick Franklin MRN: [id number] Date of Birth: 11-24-1970  Beginning of progress report period: 10/06/2018 End of progress report period: 10/14/2018  Today's Date: 10/14/2018 OT Individual Time: 1300-1402 OT Individual Time Calculation (min): 62 min   Patient has met 2 of 2 short term goals.    Pt has made functional progress at time of report. At time of evaluation he required Max A for stand pivot BSC transfers, where pt can now complete these transfers with Mod A using hemi walker. Sustained attention during self care tasks has also improved, with pt needing mod multimodal cuing to follow most tasks. He also responds better with hand over hand instruction and exhibits less resistant behaviors. Note that he is still disoriented to place and situation. 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 This can result in increased frustration and agitation from pt during OT. His sister has been present during several sessions, involved, helpful, and receptive to education from therapist. D/c plan is still SNF at this time. Continue POC.    Patient continues to demonstrate the following deficits: muscle weakness, muscle joint tightness and muscle paralysis, decreased cardiorespiratoy endurance, abnormal tone, unbalanced muscle activation, ataxia, decreased coordination and decreased motor planning, decreased visual perceptual skills, field cut and hemianopsia, decreased attention to left, left side neglect and decreased motor planning, decreased attention, decreased awareness, decreased problem solving, decreased safety awareness and decreased memory and decreased sitting balance, decreased standing balance, decreased postural control and hemiplegia and therefore will continue to benefit from skilled OT intervention to enhance overall performance with BADL.  Patient  progressing toward long term goals..  Continue plan of care.  OT Short Term Goals Week 1:  OT Short Term Goal 1 (Week 1): Pt will maintain sustained attention to 1 ADL task for 30 seconds with mod vcs OT Short Term Goal 1 - Progress (Week 1): Met OT Short Term Goal 2 (Week 1): Pt will sit EOB/EOM for 20 minutes during functional activity with no more than Min A for sitting balance OT Short Term Goal 2 - Progress (Week 1): Met Week 2:  OT Short Term Goal 1 (Week 2): STGs=LTGs due to ELOS  Skilled Therapeutic Interventions/Progress Updates:    Pt greeted in bed with no s/s pain. His lunch tray had just arrived and pt was agreeable to eat. Supine<sit completed with Mod A and for remainder of meal he required supervision for sitting balance EOB. Worked on sustained attention to task and visual scanning for needed items in minimally stimulating environment. He required Min A for scooping food, cutting chicken, and preparing bread roll. Pt politely asking OT to assist as needed during meal. Noted some external distraction with tactile and visual cuing for redirection. When he was done, pt c/o having a sweaty back. Doffed his shirt with Max A and Mod-Max A for donning a new shirt once OT washed his back. After donning footwear, he completed stand pivot<w/c using hemi walker with Mod A. Pt engaged in oral care and grooming tasks at the sink with Min A overall, vcs for sequencing and awareness. He returned to bed via stand pivot and returned to supine. Pt left repositioned to protect hemiplegic side with all needs within reach and bed alarm set.   Therapy Documentation Precautions:  Precautions Precautions: Fall Precaution Comments: Lt hemi + Lt hemianopsia at baseline Restrictions Weight Bearing Restrictions: No Vital Signs: Therapy  Vitals Temp: 97.8 F (36.6 C) Temp Source: Oral Pulse Rate: 80 Resp: 16 BP: (!) 135/98 Patient Position (if appropriate): Lying Oxygen Therapy SpO2: 96 % O2 Device:  Room Air Pain: Pain Assessment Pain Scale: 0-10 Pain Score: 5  Pain Type: Chronic pain Pain Location: Head Pain Intervention(s): Medication (See eMAR) ADL: ADL Eating: Not assessed Grooming: Moderate assistance(combing hair) Where Assessed-Grooming: Chair Upper Body Bathing: Maximal assistance Where Assessed-Upper Body Bathing: Edge of bed Lower Body Bathing: Dependent(2 assist) Where Assessed-Lower Body Bathing: Edge of bed Upper Body Dressing: Maximal assistance Where Assessed-Upper Body Dressing: Edge of bed Lower Body Dressing: Dependent(2 assist) Where Assessed-Lower Body Dressing: Edge of bed Toileting: Dependent(using Stedy) Where Assessed-Toileting: Bedside Commode Toilet Transfer: Maximal assistance Toilet Transfer Method: Stand pivot Science writer: Engineer, technical sales Transfer: Not assessed      Therapy/Group: Individual Therapy  Nahmir Zeidman A Stephanie Littman 10/14/2018, 7:28 AM

## 2018-10-14 NOTE — Plan of Care (Signed)
°  Problem: Consults Goal: RH STROKE PATIENT EDUCATION Description: See Patient Education module for education specifics  Outcome: Progressing   Problem: RH BLADDER ELIMINATION Goal: RH STG MANAGE BLADDER WITH ASSISTANCE Description: STG Manage Bladder With min Assistance Outcome: Progressing   Problem: RH SAFETY Goal: RH STG ADHERE TO SAFETY PRECAUTIONS W/ASSISTANCE/DEVICE Description: STG Adhere to Safety Precautions With mod/max Assistance/Device. Outcome: Progressing   Problem: RH COGNITION-NURSING Goal: RH STG USES MEMORY AIDS/STRATEGIES W/ASSIST TO PROBLEM SOLVE Description: STG Uses Memory Aids/Strategies With mod Assistance to Problem Solve. Outcome: Progressing   Problem: RH PAIN MANAGEMENT Goal: RH STG PAIN MANAGED AT OR BELOW [name] PAIN GOAL Description: < 4 Outcome: Progressing   Problem: RH KNOWLEDGE DEFICIT Goal: RH STG INCREASE KNOWLEDGE OF HYPERTENSION Description: Patient/caregiver will verbalize understanding of HTN including diet, exercise, medications, monitoring, and follow up care with mod assist. Outcome: Not Progressing Goal: RH STG INCREASE KNOWLEGDE OF HYPERLIPIDEMIA Description: Patient/caregiver will verbalize understanding of HLD including diet, exercise, medications, monitoring, and follow up care with mod assist. Outcome: Not Progressing Goal: RH STG INCREASE KNOWLEDGE OF STROKE PROPHYLAXIS Description: Patient/caregiver will verbalize understanding of stroke prophylaxis including diet, exercise, medications, monitoring, and follow up care with mod assist. Outcome: Not Progressing

## 2018-10-14 NOTE — Progress Notes (Signed)
[facility] PHYSICAL MEDICINE & REHABILITATION PROGRESS NOTE   Subjective/Complaints: Patient seen laying in bed this morning.  No reported issues overnight.  Keeps eyes closed for the most part.  ROS: Limited due to cognition  Objective:   No results found. No results for input(s): WBC, HGB, HCT, PLT in the last 72 hours. No results for input(s): NA, K, CL, CO2, GLUCOSE, BUN, CREATININE, CALCIUM in the last 72 hours.  Intake/Output Summary (Last 24 hours) at 10/14/2018 0928 Last data filed at 10/13/2018 2349 Gross per 24 hour  Intake 340 ml  Output 750 ml  Net -410 ml     Physical Exam: Vital Signs Blood pressure (!) 135/98, pulse 80, temperature 97.8 F (36.6 C), temperature source Oral, resp. rate 16, height 5\' 10"  (1.778 m), weight 83.6 kg, SpO2 96 %. Constitutional: No distress . Vital signs reviewed. HENT: Normocephalic.  Atraumatic. Eyes: Keeps eyes closed.  No discharge. Cardiovascular: No JVD. Respiratory: Normal effort.  No stridor. GI: Non-distended. Skin: Warm and dry.  Intact. Psych: Unable to assess due to cognition Musc: No edema in extremities.  No tenderness in extremities. Neurologic:  Somnolent Motor exam limited due to cognition  Assessment/Plan: 1. Functional deficits secondary to new posterior left MCA distribution infarct in the setting of prior right MCA infarct with chronic left hemiparesis which require 3+ hours per day of interdisciplinary therapy in a comprehensive inpatient rehab setting.  Physiatrist is providing close team supervision and 24 hour management of active medical problems listed below.  Physiatrist and rehab team continue to assess barriers to discharge/monitor patient progress toward functional and medical goals  Care Tool:  Bathing    Body parts bathed by patient: Face, Right upper leg, Left upper leg, Chest   Body parts bathed by helper: Right arm, Left arm, Abdomen, Buttocks, Right lower leg, Left lower leg Body parts  n/a: Front perineal area   Bathing assist Assist Level: Maximal Assistance - Patient 24 - 49%     Upper Body Dressing/Undressing Upper body dressing   What is the patient wearing?: Pull over shirt    Upper body assist Assist Level: Maximal Assistance - Patient 25 - 49%    Lower Body Dressing/Undressing Lower body dressing      What is the patient wearing?: Incontinence brief, Pants     Lower body assist Assist for lower body dressing: Total Assistance - Patient < 25%     Toileting Toileting    Toileting assist Assist for toileting: Maximal Assistance - Patient 25 - 49%     Transfers Chair/bed transfer  Transfers assist     Chair/bed transfer assist level: Moderate Assistance - Patient 50 - 74%     Locomotion Ambulation   Ambulation assist      Assist level: Moderate Assistance - Patient 50 - 74% Assistive device: Walker-hemi Max distance: 30   Walk 10 feet activity   Assist  Walk 10 feet activity did not occur: Safety/medical concerns  Assist level: Moderate Assistance - Patient - 50 - 74% Assistive device: Walker-hemi   Walk 50 feet activity   Assist Walk 50 feet with 2 turns activity did not occur: Safety/medical concerns         Walk 150 feet activity   Assist Walk 150 feet activity did not occur: Safety/medical concerns         Walk 10 feet on uneven surface  activity   Assist Walk 10 feet on uneven surfaces activity did not occur: Safety/medical concerns  Wheelchair     Assist Will patient use wheelchair at discharge?: Yes Type of Wheelchair: Manual Wheelchair activity did not occur: Safety/medical concerns         Wheelchair 50 feet with 2 turns activity    Assist    Wheelchair 50 feet with 2 turns activity did not occur: Safety/medical concerns       Wheelchair 150 feet activity     Assist  Wheelchair 150 feet activity did not occur: Safety/medical concerns       Blood pressure (!)  135/98, pulse 80, temperature 97.8 F (36.6 C), temperature source Oral, resp. rate 16, height 5\' 10"  (1.778 m), weight 83.6 kg, SpO2 96 %.  Medical Problem List and Plan: 1.Slurred speech left side gazesecondary to left MCA infarctionWernicke's area secondary to left mid ICA short segment high-grade stenosis on 10/01/2018. Patient has hx ofprior right MCA infarction as well as VP shunt for hydrocephalus  Continue CIR 2. Antithrombotics: -DVT/anticoagulation:SCDs. Venous Dopplers negative -antiplatelet therapy: Aspirin 81 mg daily 3. Pain Management:Tylenol as needed.    Topamax increased to twice daily for headaches.  Appears to be relatively controlled on 9/20 4. Mood:Provide emotional support -antipsychotic agents: N/A 5. Neuropsych: This patientis notcapable of making decisions on hisown behalf. 6. Skin/Wound Care:Routine skin checks 7. Fluids/Electrolytes/Nutrition:Routine I/Os.    BMP within acceptable range on 9/10, labs ordered for tomorrow 8. Seizure disorder. Vimpat 100 mg twice daily, Depakote 1000 mg twice daily. EEG negative  No seizures since admission to rehab- 9/20 9. Post stroke dysphagia. Follow-up speech therapy. Diet has been advanced to regular as of 10/04/2018. 10. Hypertension.  Patient on metoprolol 25 mg twice daily prior to admission. Resume as needed Vitals:   10/13/18 2020 10/14/18 0358  BP: (!) 126/95 (!) 135/98  Pulse: 95 80  Resp: 16 16  Temp: 98.7 F (37.1 C) 97.8 F (36.6 C)  SpO2: 96% 96%   Slightly elevated diastolic pressures on 9/20 11. Hyperlipidemia. Lipitor 80 mg daily 12. Neurogenic bladder. generally continent. 13. Chronic thrombocytopenia.   Platelets 68 on 9/10, labs ordered for tomorrow    LOS: 9 days A FACE TO FACE EVALUATION WAS PERFORMED  Fatisha Rabalais Karis JubaAnil Carnella Fryman 10/14/2018, 9:28 AM

## 2018-10-15 ENCOUNTER — Inpatient Hospital Stay (HOSPITAL_COMMUNITY): Payer: 59 | Admitting: Speech Pathology

## 2018-10-15 ENCOUNTER — Inpatient Hospital Stay (HOSPITAL_COMMUNITY): Payer: 59

## 2018-10-15 ENCOUNTER — Inpatient Hospital Stay (HOSPITAL_COMMUNITY): Payer: 59 | Admitting: Physical Therapy

## 2018-10-15 ENCOUNTER — Inpatient Hospital Stay (HOSPITAL_COMMUNITY): Payer: 59 | Admitting: Occupational Therapy

## 2018-10-15 LAB — CBC WITH DIFFERENTIAL/PLATELET
Abs Immature Granulocytes: 0.03 10*3/uL (ref 0.00–0.07)
Basophils Absolute: 0 10*3/uL (ref 0.0–0.1)
Basophils Relative: 0 %
Eosinophils Absolute: 0.1 10*3/uL (ref 0.0–0.5)
Eosinophils Relative: 3 %
HCT: 48.4 % (ref 39.0–52.0)
Hemoglobin: 15.6 g/dL (ref 13.0–17.0)
Immature Granulocytes: 1 %
Lymphocytes Relative: 35 %
Lymphs Abs: 1.4 10*3/uL (ref 0.7–4.0)
MCH: 29.7 pg (ref 26.0–34.0)
MCHC: 32.2 g/dL (ref 30.0–36.0)
MCV: 92.2 fL (ref 80.0–100.0)
Monocytes Absolute: 0.7 10*3/uL (ref 0.1–1.0)
Monocytes Relative: 17 %
Neutro Abs: 1.8 10*3/uL (ref 1.7–7.7)
Neutrophils Relative %: 44 %
Platelets: 113 10*3/uL — ABNORMAL LOW (ref 150–400)
RBC: 5.25 MIL/uL (ref 4.22–5.81)
RDW: 14.8 % (ref 11.5–15.5)
WBC: 4.1 10*3/uL (ref 4.0–10.5)
nRBC: 0 % (ref 0.0–0.2)

## 2018-10-15 LAB — BASIC METABOLIC PANEL
Anion gap: 10 (ref 5–15)
BUN: 7 mg/dL (ref 6–20)
CO2: 25 mmol/L (ref 22–32)
Calcium: 8.6 mg/dL — ABNORMAL LOW (ref 8.9–10.3)
Chloride: 107 mmol/L (ref 98–111)
Creatinine, Ser: 1.08 mg/dL (ref 0.61–1.24)
GFR calc Af Amer: >60 mL/min (ref >60)
GFR calc non Af Amer: >60 mL/min (ref >60)
Glucose, Bld: 111 mg/dL — ABNORMAL HIGH (ref 70–99)
Potassium: 3.8 mmol/L (ref 3.5–5.1)
Sodium: 142 mmol/L (ref 135–145)

## 2018-10-15 MED ORDER — METOPROLOL TARTRATE 12.5 MG HALF TABLET
12.5000 mg | ORAL_TABLET | Freq: Two times a day (BID) | ORAL | Status: DC
  Administered 2018-10-15 – 2018-10-25 (×22): 12.5 mg via ORAL
  Filled 2018-10-15 (×22): qty 1

## 2018-10-15 NOTE — Progress Notes (Signed)
Physical Therapy Session Note  Patient Details  Name: Patrick Franklin MRN: [id number] Date of Birth: Nov 25, 1970  Today's Date: 10/15/2018 PT Individual Time: 1400-1500 PT Individual Time Calculation (min): 60 min   Short Term Goals: Week 2:  PT Short Term Goal 1 (Week 2): STG=LTG due to ELOS  Skilled Therapeutic Interventions/Progress Updates: Pt presented in bed asking to sleep. PT reorientated to situation and ultimately agreeable to therapy. Pt required increased time and re-direction to performed bed mobility but able to perform supine to sit on R with minA and mod verbal cues. Performed stand pivot with HW to w/c however required x 3 attempts as pt unable to sustain task and would initiate transfer bu then begin to sit back down on bed. On third attempt pt able to perform stand pivot with min/modA. Pt then moving w/c to sink and performed oral hygiene with supervision with pt only requiring re-direction when filling styrofoam cup with mouthwash. Pt transported to day room at performed stand pivot to mat minA. Pt attempted to perform cognitive task of matching ping pong balls to picture but became frustrated with colors so PTA had pt just place balls in carton for sustained task. Pt was able to place 9/12 balls before becoming distracted. Pt ambulated 50ft with HW and minA with w/c follow with pt stating "I'm so tired I can't see" and attempting to sit. Pt transported back to room and performed stand pivot with HW and modA. Pt was able to return to supine with CGA, use of bed features and increased time. Pt left in bed at end of session with bed alarm on, call bell within reach and needs met.      Therapy Documentation Precautions:  Precautions Precautions: Fall Precaution Comments: Lt hemi + Lt hemianopsia at baseline Restrictions Weight Bearing Restrictions: No General:   Vital Signs: Therapy Vitals Pulse Rate: 78 Resp: 18 BP: 131/88 Patient Position (if appropriate):  Lying Oxygen Therapy SpO2: 98 % O2 Device: Room Air Pain:   Mobility:   Locomotion :    Trunk/Postural Assessment :    Balance:   Exercises:   Other Treatments:      Therapy/Group: Individual Therapy  Mort Smelser 10/15/2018, 4:19 PM

## 2018-10-15 NOTE — Progress Notes (Signed)
Physical Therapy Session Note  Patient Details  Name: Patrick Franklin MRN: [id number] Date of Birth: 1970/10/05  Today's Date: 10/15/2018 PT Individual Time: 0935-1000 PT Individual Time Calculation (min): 25 min   Short Term Goals: Week 2:  PT Short Term Goal 1 (Week 2): STG=LTG due to ELOS  Skilled Therapeutic Interventions/Progress Updates:     Patient in bed with a lab tech at bedside for a blood draw upon PT arrival. Patient missed 7 min skilled PT due to blood draw. PT returned to room with patient in bed, alert, and agreeable to PT session.  Patient requested to use the urinal. PT assisted patient with max A for placement of urinal, patient stated he was going to void x3 without any success. Patient stated he was done. He was unaware that he did not go when PT informed him, but continued to state that he was done. Educated patient on use of urinal and bladder massage during to promote voiding during. Patient perseverated on describing cookies and watching a movie throughout, required max cues to attend to voiding in the urinal.   Performed rolling R and L and scooting up in the bed for improved positioning in the bed with max A and max cues for technique. Provided cues for pushing through his R LE to assist with mobility and using his R arm to pull himself over to roll or up in the bed.   Patient in bed at end of session with breaks locked, bed alarm set, and all needs within reach.    Therapy Documentation Precautions:  Precautions Precautions: Fall Precaution Comments: Lt hemi + Lt hemianopsia at baseline Restrictions Weight Bearing Restrictions: No    Therapy/Group: Individual Therapy  [name] L Anays Detore PT, DPT  10/15/2018, 4:59 PM

## 2018-10-15 NOTE — Plan of Care (Signed)
°  Problem: Consults °Goal: RH STROKE PATIENT EDUCATION °Description: See Patient Education module for education specifics  °Outcome: Progressing °  °Problem: RH BLADDER ELIMINATION °Goal: RH STG MANAGE BLADDER WITH ASSISTANCE °Description: STG Manage Bladder With min Assistance °Outcome: Progressing °  °Problem: RH SAFETY °Goal: RH STG ADHERE TO SAFETY PRECAUTIONS W/ASSISTANCE/DEVICE °Description: STG Adhere to Safety Precautions With mod/max Assistance/Device. °Outcome: Progressing °  °Problem: RH COGNITION-NURSING °Goal: RH STG USES MEMORY AIDS/STRATEGIES W/ASSIST TO PROBLEM SOLVE °Description: STG Uses Memory Aids/Strategies With mod Assistance to Problem Solve. °Outcome: Progressing °  °Problem: RH PAIN MANAGEMENT °Goal: RH STG PAIN MANAGED AT OR BELOW [name] PAIN GOAL °Description: < 4 °Outcome: Progressing °  °Problem: RH KNOWLEDGE DEFICIT °Goal: RH STG INCREASE KNOWLEDGE OF HYPERTENSION °Description: Patient/caregiver will verbalize understanding of HTN including diet, exercise, medications, monitoring, and follow up care with mod assist. °Outcome: Progressing °Goal: RH STG INCREASE KNOWLEGDE OF HYPERLIPIDEMIA °Description: Patient/caregiver will verbalize understanding of HLD including diet, exercise, medications, monitoring, and follow up care with mod assist. °Outcome: Progressing °Goal: RH STG INCREASE KNOWLEDGE OF STROKE PROPHYLAXIS °Description: Patient/caregiver will verbalize understanding of stroke prophylaxis including diet, exercise, medications, monitoring, and follow up care with mod assist. °Outcome: Progressing °  °

## 2018-10-15 NOTE — Progress Notes (Signed)
Occupational Therapy Session Note  Patient Details  Name: Patrick Franklin MRN: [id number] Date of Birth: Oct 21, 1970  Today's Date: 10/15/2018 OT Individual Time: 6734-1937 OT Individual Time Calculation (min): 58 min   Short Term Goals: Week 2:  OT Short Term Goal 1 (Week 2): STGs=LTGs due to ELOS  Skilled Therapeutic Interventions/Progress Updates:    Pt greeted in bed, just finished breakfast and reported urgently needing to use the bathroom. Supine<sit completed with supervision with HOB elevated. OT then donned his footwear. Mod A for stand pivot<BSC using hemi walker where pt had continent BM void. Min-Mod A for standing balance while OT assisted with perihygiene and donning clean LB clothing. After transferring to the w/c with hemi walker and Mod A, he completed oral care and grooming tasks w/c level at the sink. Min A for opening mouth wash and supervision for sequencing, attention, and awareness. He doffed his shirt with Max A and washed UB with step by step instruction. Mod-Max A for overhead shirt. When transferring back to bed, pt repeating "it's wet it's wet" (referring to hemi walker, as he'd just washed/dried his hands). Pt began ambulating away from bed, repeating these words, agitated, and unable to be safely redirected to the bed. He was becoming unsafe so 2nd helper had to intervene to assist him safely transferring to a chair. Stand pivot<bed completed afterwards with Min A for transition to supine. He was repositioned for comfort to protect hemiplegic side. Left him with all needs within reach and bed alarm set. Tx focus placed on activity tolerance, ADL retraining, cognition, and functional transfers.   Therapy Documentation Precautions:  Precautions Precautions: Fall Precaution Comments: Lt hemi + Lt hemianopsia at baseline Restrictions Weight Bearing Restrictions: No Pain: Pain Assessment Pain Scale: Faces Faces Pain Scale: No hurt ADL: ADL Eating: Not  assessed Grooming: Moderate assistance(combing hair) Where Assessed-Grooming: Chair Upper Body Bathing: Maximal assistance Where Assessed-Upper Body Bathing: Edge of bed Lower Body Bathing: Dependent(2 assist) Where Assessed-Lower Body Bathing: Edge of bed Upper Body Dressing: Maximal assistance Where Assessed-Upper Body Dressing: Edge of bed Lower Body Dressing: Dependent(2 assist) Where Assessed-Lower Body Dressing: Edge of bed Toileting: Dependent(using Stedy) Where Assessed-Toileting: Bedside Commode Toilet Transfer: Maximal assistance Toilet Transfer Method: Stand pivot Science writer: Engineer, technical sales Transfer: Not assessed     Therapy/Group: Individual Therapy  [name] 10/15/2018, 12:34 PM

## 2018-10-15 NOTE — Progress Notes (Addendum)
[facility] PHYSICAL MEDICINE & REHABILITATION PROGRESS NOTE   Subjective/Complaints: No new issues. Slept per RN.   ROS: Limited due to cognitive/behavioral    Objective:   No results found. No results for input(s): WBC, HGB, HCT, PLT in the last 72 hours. No results for input(s): NA, K, CL, CO2, GLUCOSE, BUN, CREATININE, CALCIUM in the last 72 hours.  Intake/Output Summary (Last 24 hours) at 10/15/2018 1000 Last data filed at 10/15/2018 0741 Gross per 24 hour  Intake 1154 ml  Output 1025 ml  Net 129 ml     Physical Exam: Vital Signs Blood pressure (!) 130/99, pulse 75, temperature 97.8 F (36.6 C), temperature source Oral, resp. rate 16, height 5\' 10"  (1.778 m), weight 83.6 kg, SpO2 96 %. Constitutional: No distress . Vital signs reviewed. HEENT: EOMI, oral membranes moist Neck: supple Cardiovascular: RRR without murmur. No JVD    Respiratory: CTA Bilaterally without wheezes or rales. Normal effort    GI: BS +, non-tender, non-distended  Skin: Warm and dry.  Intact. Psych: flat, withdrawn Musc: No edema in extremities.  No tenderness in extremities. Neurologic: won't consistently participate in exam Motor exam limited due to cognition  Assessment/Plan: 1. Functional deficits secondary to new posterior left MCA distribution infarct in the setting of prior right MCA infarct with chronic left hemiparesis which require 3+ hours per day of interdisciplinary therapy in a comprehensive inpatient rehab setting.  Physiatrist is providing close team supervision and 24 hour management of active medical problems listed below.  Physiatrist and rehab team continue to assess barriers to discharge/monitor patient progress toward functional and medical goals  Care Tool:  Bathing    Body parts bathed by patient: Face, Right upper leg, Left upper leg, Chest, Front perineal area, Abdomen   Body parts bathed by helper: Right arm, Left arm, Buttocks Body parts n/a: Right lower leg, Left  lower leg   Bathing assist Assist Level: Maximal Assistance - Patient 24 - 49%     Upper Body Dressing/Undressing Upper body dressing   What is the patient wearing?: Pull over shirt    Upper body assist Assist Level: Maximal Assistance - Patient 25 - 49%    Lower Body Dressing/Undressing Lower body dressing      What is the patient wearing?: Incontinence brief, Pants     Lower body assist Assist for lower body dressing: Total Assistance - Patient < 25%     Toileting Toileting    Toileting assist Assist for toileting: Maximal Assistance - Patient 25 - 49%     Transfers Chair/bed transfer  Transfers assist     Chair/bed transfer assist level: Moderate Assistance - Patient 50 - 74%     Locomotion Ambulation   Ambulation assist      Assist level: Moderate Assistance - Patient 50 - 74% Assistive device: Walker-hemi Max distance: 30   Walk 10 feet activity   Assist  Walk 10 feet activity did not occur: Safety/medical concerns  Assist level: Moderate Assistance - Patient - 50 - 74% Assistive device: Walker-hemi   Walk 50 feet activity   Assist Walk 50 feet with 2 turns activity did not occur: Safety/medical concerns         Walk 150 feet activity   Assist Walk 150 feet activity did not occur: Safety/medical concerns         Walk 10 feet on uneven surface  activity   Assist Walk 10 feet on uneven surfaces activity did not occur: Safety/medical concerns  Wheelchair     Assist Will patient use wheelchair at discharge?: Yes Type of Wheelchair: Manual Wheelchair activity did not occur: Safety/medical concerns         Wheelchair 50 feet with 2 turns activity    Assist    Wheelchair 50 feet with 2 turns activity did not occur: Safety/medical concerns       Wheelchair 150 feet activity     Assist  Wheelchair 150 feet activity did not occur: Safety/medical concerns       Blood pressure (!) 130/99, pulse  75, temperature 97.8 F (36.6 C), temperature source Oral, resp. rate 16, height 5\' 10"  (1.778 m), weight 83.6 kg, SpO2 96 %.  Medical Problem List and Plan: 1.Slurred speech left side gazesecondary to left MCA infarctionWernicke's area secondary to left mid ICA short segment high-grade stenosis on 10/01/2018. Patient has hx ofprior right MCA infarction as well as VP shunt for hydrocephalus  Continue CIR therapies 2. Antithrombotics: -DVT/anticoagulation:SCDs. Venous Dopplers negative -antiplatelet therapy: Aspirin 81 mg daily 3. Pain Management:Tylenol as needed.    Topamax increased to twice daily for headaches.  Controlled as of 9/21 4. Mood:Provide emotional support -antipsychotic agents: N/A 5. Neuropsych: This patientis notcapable of making decisions on hisown behalf. 6. Skin/Wound Care:Routine skin checks 7. Fluids/Electrolytes/Nutrition:Routine I/Os.    BMP within acceptable range on 9/10, labs pending for 9/21 8. Seizure disorder. Vimpat 100 mg twice daily, Depakote 1000 mg twice daily. EEG negative  No seizures since admission to rehab- 9/21 9. Post stroke dysphagia. Follow-up speech therapy. Diet has been advanced to regular as of 10/04/2018. 10. Hypertension.  Patient on metoprolol 25 mg twice daily prior to admission. Resume as needed Vitals:   10/14/18 0358 10/14/18 1702  BP: (!) 135/98 (!) 130/99  Pulse: 80 75  Resp: 16   Temp: 97.8 F (36.6 C)   SpO2: 96%    9/21  DBP's remain rather high 99-110   -resume metoprolol at 12.5mg  bid 11. Hyperlipidemia. Lipitor 80 mg daily 12. Neurogenic bladder. generally continent. 13. Chronic thrombocytopenia.   Platelets 68 on 9/10, labs pending 9/21    LOS: 10 days A FACE TO FACE EVALUATION WAS PERFORMED  Ranelle Oyster 10/15/2018, 10:00 AM

## 2018-10-15 NOTE — Progress Notes (Signed)
Speech Language Pathology Daily Session Note  Patient Details  Name: Patrick Franklin MRN: [id number] Date of Birth: [birth year]  Today's Date: 10/15/2018 SLP Individual Time: 1105-1200 SLP Individual Time Calculation (min): 55 min  Short Term Goals: Week 2: SLP Short Term Goal 1 (Week 2): STG = LTG due to LOS  Skilled Therapeutic Interventions: Pt was seen for skilled ST targeting cognition. SLP facilitated session with Mod A visual and verbal cues to identify and sort coins by type (pennies, nickels, and dimes). Mod A verbal cues provided for pt to count total number of pennies. Pt expressed need for urinal during session, thus SLP assisted pt with voiding. Pt followed basic 1-step directions to sequence doffing pants and brief as well as placement of urinal with Min-Mod A multimodal cues. Pt was continent of urine. Once self care toileting tasks complete and pt donned pants, SLP attempted to engage pt in basic problem solving card task - identifying higher number from field of 2, however pt unwilling to participate in this task despite Max multimodal cues and encouragement. Pt did engage in color matching task with overall Mod A for initiation and Min A for accuracy and error awareness in matching colored blocks to a color pattern board (up to 9 color pattern). Pt was left in bed with alarm set and all needs within reach. Continue per current plan of care.      Pain Pain Assessment Pain Scale: Faces Faces Pain Scale: No hurt  Therapy/Group: Individual Therapy  Arbutus Leas 10/15/2018, 12:08 PM

## 2018-10-16 ENCOUNTER — Inpatient Hospital Stay (HOSPITAL_COMMUNITY): Payer: 59 | Admitting: Speech Pathology

## 2018-10-16 ENCOUNTER — Inpatient Hospital Stay (HOSPITAL_COMMUNITY): Payer: 59 | Admitting: Physical Therapy

## 2018-10-16 ENCOUNTER — Inpatient Hospital Stay (HOSPITAL_COMMUNITY): Payer: 59 | Admitting: Occupational Therapy

## 2018-10-16 NOTE — Progress Notes (Signed)
Physical Therapy Session Note  Patient Details  Name: Patrick Franklin MRN: [id number] Date of Birth: October 16, 1970  Today's Date: 10/16/2018 PT Individual Time: 0900-1015 PT Individual Time Calculation (min): 75 min   Short Term Goals: Week 2:  PT Short Term Goal 1 (Week 2): STG=LTG due to ELOS  Skilled Therapeutic Interventions/Progress Updates: Pt presented in w/c requesting to go back to bed. With re-direction and distraction pt able to participate in therapy. PTA donned AFO/shoes with total A and LB clothing management total A. Pt performed STS with HW minA to allow PTA to pull pants over hips. Pt transported to day room and participated in STS and seated horseshoe toss for sustained task. Pt able to throw x12 horseshoes before becoming distracted. Pt ambulated x 108ft with w/c follow and min to modA due to uneven step length. Pt with no LOB but unsteadiness noted particularly with larger/longer steps. Pt noted to impulsively sit without warning during ambulation. Transported to rehab gym and performed stand pivot to mat minA with HW. Particiapted in kicking ball for forced use of RLE and sustained activity. Pt able to kick ball x 8 before becoming distracted. Performed stand pivot transfer to w/c modA. Pt returned to room and initially requesting to return to bed, once set up pt turning w/c to sink then stating needed to brush teeth. Pt left at sink with belt alarm placed and NT notified of pt's disposition.      Therapy Documentation Precautions:  Precautions Precautions: Fall Precaution Comments: Lt hemi + Lt hemianopsia at baseline Restrictions Weight Bearing Restrictions: No General:   Vital Signs: Therapy Vitals Temp: 98.7 F (37.1 C) Pulse Rate: 95 Resp: 20 BP: 118/90 Patient Position (if appropriate): Lying Oxygen Therapy SpO2: 98 % O2 Device: Room Air   Therapy/Group: Individual Therapy  Jazalyn Mondor  Keni Wafer, PTA  10/16/2018, 4:04 PM

## 2018-10-16 NOTE — Progress Notes (Signed)
Speech Language Pathology Daily Session Note  Patient Details  Name: Patrick Franklin MRN: [id number] Date of Birth: 1970/08/20  Today's Date: 10/16/2018 SLP Individual Time: 1100-1157 SLP Individual Time Calculation (min): 57 min  Short Term Goals: Week 2: SLP Short Term Goal 1 (Week 2): STG = LTG due to LOS  Skilled Therapeutic Interventions: Pt was seen for skilled ST targeting cognition. Pt was appropriate upon greeting and engaged in simple conversation, answered questions about his day appropriately for ~1 minute. [name] attention fluctuated widely throughout session; at times he could sustain attention to tasks for ~2 minutes with Mod A multimodal cues for redirection, other times he could only attend for 5-10 second intervals with Max A. Pt identified cards by color (black or red) with overall Mod A for error awareness. He also matched colored blocks to associated colors on a board (up to 9 color pattern) with Min A for accuracy and error awareness/correction. Pt expressed wants and needs with Min A question cues throughout session. Mod A verbal and tactile cues for sequencing provided during functional tasks such as donning/doffing pants, using urinal etc. Pt continues to intermittently become disengaged during sessions and will answer ~50-60% questions. Pt follows ~60% of 1-step directions and requires very simple and direct language in order to do so. Pt was left in bed with alarm set and call bell within reach. Continue per current plan of care.       Pain Pain Assessment Pain Scale: Faces Pain Score: 0-No pain Faces Pain Scale: No hurt  Therapy/Group: Individual Therapy  Arbutus Leas 10/16/2018, 11:51 AM

## 2018-10-16 NOTE — Plan of Care (Signed)
°  Problem: Consults °Goal: RH STROKE PATIENT EDUCATION °Description: See Patient Education module for education specifics  °Outcome: Progressing °  °Problem: RH BLADDER ELIMINATION °Goal: RH STG MANAGE BLADDER WITH ASSISTANCE °Description: STG Manage Bladder With min Assistance °Outcome: Progressing °  °Problem: RH SAFETY °Goal: RH STG ADHERE TO SAFETY PRECAUTIONS W/ASSISTANCE/DEVICE °Description: STG Adhere to Safety Precautions With mod/max Assistance/Device. °Outcome: Progressing °  °Problem: RH COGNITION-NURSING °Goal: RH STG USES MEMORY AIDS/STRATEGIES W/ASSIST TO PROBLEM SOLVE °Description: STG Uses Memory Aids/Strategies With mod Assistance to Problem Solve. °Outcome: Progressing °  °Problem: RH PAIN MANAGEMENT °Goal: RH STG PAIN MANAGED AT OR BELOW [name] PAIN GOAL °Description: < 4 °Outcome: Progressing °  °Problem: RH KNOWLEDGE DEFICIT °Goal: RH STG INCREASE KNOWLEDGE OF HYPERTENSION °Description: Patient/caregiver will verbalize understanding of HTN including diet, exercise, medications, monitoring, and follow up care with mod assist. °Outcome: Progressing °Goal: RH STG INCREASE KNOWLEGDE OF HYPERLIPIDEMIA °Description: Patient/caregiver will verbalize understanding of HLD including diet, exercise, medications, monitoring, and follow up care with mod assist. °Outcome: Progressing °Goal: RH STG INCREASE KNOWLEDGE OF STROKE PROPHYLAXIS °Description: Patient/caregiver will verbalize understanding of stroke prophylaxis including diet, exercise, medications, monitoring, and follow up care with mod assist. °Outcome: Progressing °  °

## 2018-10-16 NOTE — Progress Notes (Signed)
[facility] PHYSICAL MEDICINE & REHABILITATION PROGRESS NOTE   Subjective/Complaints: Patient seen lying in bed this morning.  No reported issues overnight.  He briefly opens his eyes and smiles.  ROS: Limited due to cognition   Objective:   No results found. Recent Labs    10/15/18 0936  WBC 4.1  HGB 15.6  HCT 48.4  PLT 113*   Recent Labs    10/15/18 0936  NA 142  K 3.8  CL 107  CO2 25  GLUCOSE 111*  BUN 7  CREATININE 1.08  CALCIUM 8.6*    Intake/Output Summary (Last 24 hours) at 10/16/2018 0908 Last data filed at 10/16/2018 0845 Gross per 24 hour  Intake 442 ml  Output 550 ml  Net -108 ml     Physical Exam: Vital Signs Blood pressure (!) 129/96, pulse 77, temperature 97.8 F (36.6 C), temperature source Oral, resp. rate 17, height 5\' 10"  (1.778 m), weight 83.6 kg, SpO2 97 %. Constitutional: No distress . Vital signs reviewed. HENT: Normocephalic.  Atraumatic. Eyes: EOMI. No discharge. Cardiovascular: No JVD. Respiratory: Normal effort.  No stridor. GI: Non-distended. Skin: Warm and dry.  Intact. Psych: Unable to assess due to cognition. Musc: No edema in extremities.  No tenderness in extremities. Neurologic: Alert Motor examination limited due to cognition  Assessment/Plan: 1. Functional deficits secondary to new posterior left MCA distribution infarct in the setting of prior right MCA infarct with chronic left hemiparesis which require 3+ hours per day of interdisciplinary therapy in a comprehensive inpatient rehab setting.  Physiatrist is providing close team supervision and 24 hour management of active medical problems listed below.  Physiatrist and rehab team continue to assess barriers to discharge/monitor patient progress toward functional and medical goals  Care Tool:  Bathing    Body parts bathed by patient: Face, Right upper leg, Left upper leg, Chest, Front perineal area, Abdomen   Body parts bathed by helper: Right arm, Left arm,  Buttocks Body parts n/a: Right lower leg, Left lower leg   Bathing assist Assist Level: Maximal Assistance - Patient 24 - 49%     Upper Body Dressing/Undressing Upper body dressing   What is the patient wearing?: Pull over shirt    Upper body assist Assist Level: Maximal Assistance - Patient 25 - 49%    Lower Body Dressing/Undressing Lower body dressing      What is the patient wearing?: Incontinence brief, Pants     Lower body assist Assist for lower body dressing: Total Assistance - Patient < 25%     Toileting Toileting    Toileting assist Assist for toileting: Maximal Assistance - Patient 25 - 49%     Transfers Chair/bed transfer  Transfers assist     Chair/bed transfer assist level: Moderate Assistance - Patient 50 - 74%     Locomotion Ambulation   Ambulation assist      Assist level: Moderate Assistance - Patient 50 - 74% Assistive device: Walker-hemi Max distance: 30   Walk 10 feet activity   Assist  Walk 10 feet activity did not occur: Safety/medical concerns  Assist level: Moderate Assistance - Patient - 50 - 74% Assistive device: Walker-hemi   Walk 50 feet activity   Assist Walk 50 feet with 2 turns activity did not occur: Safety/medical concerns         Walk 150 feet activity   Assist Walk 150 feet activity did not occur: Safety/medical concerns         Walk 10 feet on uneven  surface  activity   Assist Walk 10 feet on uneven surfaces activity did not occur: Safety/medical concerns         Wheelchair     Assist Will patient use wheelchair at discharge?: Yes Type of Wheelchair: Manual Wheelchair activity did not occur: Safety/medical concerns         Wheelchair 50 feet with 2 turns activity    Assist    Wheelchair 50 feet with 2 turns activity did not occur: Safety/medical concerns       Wheelchair 150 feet activity     Assist  Wheelchair 150 feet activity did not occur: Safety/medical  concerns       Blood pressure (!) 129/96, pulse 77, temperature 97.8 F (36.6 C), temperature source Oral, resp. rate 17, height 5\' 10"  (1.778 m), weight 83.6 kg, SpO2 97 %.  Medical Problem List and Plan: 1.Slurred speech left side gazesecondary to left MCA infarctionWernicke's area secondary to left mid ICA short segment high-grade stenosis on 10/01/2018. Patient has hx ofprior right MCA infarction as well as VP shunt for hydrocephalus  Cont CIR 2. Antithrombotics: -DVT/anticoagulation:SCDs. Venous Dopplers negative -antiplatelet therapy: Aspirin 81 mg daily 3. Pain Management:Tylenol as needed.    Topamax increased to twice daily for headaches.  Appears controlled on 9/22 4. Mood:Provide emotional support -antipsychotic agents: N/A 5. Neuropsych: This patientis notcapable of making decisions on hisown behalf. 6. Skin/Wound Care:Routine skin checks 7. Fluids/Electrolytes/Nutrition:Routine I/Os.    BMP within acceptable range on 9/21, however creatinine?  Trending up I will continue to follow 8. Seizure disorder. Vimpat 100 mg twice daily, Depakote 1000 mg twice daily. EEG negative  No seizures since admission to rehab- 9/22 9. Post stroke dysphagia. Follow-up speech therapy. Diet has been advanced to regular as of 10/04/2018. 10. Hypertension.  Patient on metoprolol 25 mg twice daily prior to admission. Resume as needed Vitals:   10/15/18 1352 10/16/18 0311  BP: 131/88 (!) 129/96  Pulse: 78 77  Resp: 18 17  Temp:  97.8 F (36.6 C)  SpO2: 98% 97%   Resume metoprolol at 12.5mg  bid on 9/21  Diastolic blood pressure remains elevated on 9/22 11. Hyperlipidemia. Lipitor 80 mg daily 12. Neurogenic bladder. generally continent. 13. Chronic thrombocytopenia.   Platelets 139 9/21  Continue to monitor    LOS: 11 days A FACE TO FACE EVALUATION WAS PERFORMED  Lesslie Mossa 10/21 10/16/2018, 9:08 AM

## 2018-10-16 NOTE — Progress Notes (Signed)
Occupational Therapy Session Note  Patient Details  Name: Patrick Franklin MRN: [id number] Date of Birth: [birth year]  Today's Date: 10/16/2018 OT Individual Time: 1415-1510 OT Individual Time Calculation (min): 55 min    Short Term Goals: Week 2:  OT Short Term Goal 1 (Week 2): STGs=LTGs due to ELOS  Skilled Therapeutic Interventions/Progress Updates:    Upon entering the room, pt supine in bed and sleeping soundly. Pt agreeable to OT intervention with encouragement. Pt perseverating on family dynamic situation and needing max cuing to redirect. Pt required total A to don pants and B shoes from bed level. Supine >sit with mod A. Stand pivot transfer from bed >wheelchair with mod A. OT assisted pt to gym for dynavision task to address sustained attention. Pt engaged in task for 5 minutes with min cuing and hitting 96 targets. Pt appearing very fatigued at this point and falling asleep in chair. OT assisted pt back to room and transferred back into bed with mod A stand pivot transfer. Pt maintaining sitting balance with supervision while therapist removed B shoes and AFO. Sit >supine with mod A for safety. Pt drinking water with supervision. Bed alarm activated and call bell within reach. Pt sleeping soundly before therapist exiting the room.   Therapy Documentation Precautions:  Precautions Precautions: Fall Precaution Comments: Lt hemi + Lt hemianopsia at baseline Restrictions Weight Bearing Restrictions: No General:   Vital Signs: Therapy Vitals Temp: 98.7 F (37.1 C) Pulse Rate: 95 Resp: 20 BP: 118/90 Patient Position (if appropriate): Lying Oxygen Therapy SpO2: 98 % O2 Device: Room Air Pain: Pain Assessment Pain Score: 0-No pain ADL: ADL Eating: Not assessed Grooming: Moderate assistance(combing hair) Where Assessed-Grooming: Chair Upper Body Bathing: Maximal assistance Where Assessed-Upper Body Bathing: Edge of bed Lower Body Bathing: Dependent(2 assist) Where  Assessed-Lower Body Bathing: Edge of bed Upper Body Dressing: Maximal assistance Where Assessed-Upper Body Dressing: Edge of bed Lower Body Dressing: Dependent(2 assist) Where Assessed-Lower Body Dressing: Edge of bed Toileting: Dependent(using Stedy) Where Assessed-Toileting: Bedside Commode Toilet Transfer: Maximal assistance Toilet Transfer Method: Stand pivot Science writer: Engineer, technical sales Transfer: Not assessed   Therapy/Group: Individual Therapy  Gypsy Decant 10/16/2018, 3:03 PM

## 2018-10-17 ENCOUNTER — Inpatient Hospital Stay (HOSPITAL_COMMUNITY): Payer: 59 | Admitting: Speech Pathology

## 2018-10-17 ENCOUNTER — Inpatient Hospital Stay (HOSPITAL_COMMUNITY): Payer: 59 | Admitting: Physical Therapy

## 2018-10-17 ENCOUNTER — Inpatient Hospital Stay (HOSPITAL_COMMUNITY): Payer: 59 | Admitting: Occupational Therapy

## 2018-10-17 MED ORDER — TOPIRAMATE 25 MG PO TABS
25.0000 mg | ORAL_TABLET | Freq: Every day | ORAL | Status: DC
  Administered 2018-10-18 – 2018-11-04 (×18): 25 mg via ORAL
  Filled 2018-10-17 (×18): qty 1

## 2018-10-17 NOTE — Progress Notes (Signed)
Patrick [facility] PHYSICAL MEDICINE & REHABILITATION PROGRESS NOTE   Subjective/Complaints: Patient seen laying in bed this morning and later working with therapies.  He is more alert this morning.  No reported issues overnight.  ROS: Limited due to cognition  Objective:   No results found. Recent Labs    10/15/18 0936  WBC 4.1  HGB 15.6  HCT 48.4  PLT 113*   Recent Labs    10/15/18 0936  NA 142  K 3.8  CL 107  CO2 25  GLUCOSE 111*  BUN 7  CREATININE 1.08  CALCIUM 8.6*    Intake/Output Summary (Last 24 hours) at 10/17/2018 0921 Last data filed at 10/17/2018 0647 Gross per 24 hour  Intake 120 ml  Output 400 ml  Net -280 ml     Physical Exam: Vital Signs Blood pressure 119/89, pulse 67, temperature 98 F (36.7 C), resp. rate 19, height 5\' 10"  (1.778 m), weight 83.6 kg, SpO2 98 %. Constitutional: No distress . Vital signs reviewed. HENT: Right craniectomy site Eyes: EOMI. No discharge. Cardiovascular: No JVD. Respiratory: Normal effort.  No stridor. GI: Non-distended. Skin: Warm and dry.  Intact. Psych: Limited due to cognition.  Perseverative. Musc: No edema in extremities.  No tenderness in extremities. Neurologic: Alert Motor examination limited due to cognition, spontaneously moving right side  Assessment/Plan: 1. Functional deficits secondary to new posterior left MCA distribution infarct in the setting of prior right MCA infarct with chronic left hemiparesis which require 3+ hours per day of interdisciplinary therapy in a comprehensive inpatient rehab setting.  Physiatrist is providing close team supervision and 24 hour management of active medical problems listed below.  Physiatrist and rehab team continue to assess barriers to discharge/monitor patient progress toward functional and medical goals  Care Tool:  Bathing    Body parts bathed by patient: Face, Right upper leg, Left upper leg, Chest, Front perineal area, Abdomen   Body parts bathed by  helper: Right arm, Left arm, Buttocks Body parts n/a: Right lower leg, Left lower leg   Bathing assist Assist Level: Maximal Assistance - Patient 24 - 49%     Upper Body Dressing/Undressing Upper body dressing   What is the patient wearing?: Pull over shirt    Upper body assist Assist Level: Maximal Assistance - Patient 25 - 49%    Lower Body Dressing/Undressing Lower body dressing      What is the patient wearing?: Pants     Lower body assist Assist for lower body dressing: Total Assistance - Patient < 25%     Toileting Toileting    Toileting assist Assist for toileting: Maximal Assistance - Patient 25 - 49%     Transfers Chair/bed transfer  Transfers assist     Chair/bed transfer assist level: Moderate Assistance - Patient 50 - 74%     Locomotion Ambulation   Ambulation assist      Assist level: Moderate Assistance - Patient 50 - 74% Assistive device: Walker-hemi Max distance: 25   Walk 10 feet activity   Assist  Walk 10 feet activity did not occur: Safety/medical concerns  Assist level: Moderate Assistance - Patient - 50 - 74% Assistive device: Walker-hemi   Walk 50 feet activity   Assist Walk 50 feet with 2 turns activity did not occur: Safety/medical concerns         Walk 150 feet activity   Assist Walk 150 feet activity did not occur: Safety/medical concerns         Walk 10 feet on  uneven surface  activity   Assist Walk 10 feet on uneven surfaces activity did not occur: Safety/medical concerns         Wheelchair     Assist Will patient use wheelchair at discharge?: Yes Type of Wheelchair: Manual Wheelchair activity did not occur: Safety/medical concerns         Wheelchair 50 feet with 2 turns activity    Assist    Wheelchair 50 feet with 2 turns activity did not occur: Safety/medical concerns       Wheelchair 150 feet activity     Assist  Wheelchair 150 feet activity did not occur:  Safety/medical concerns       Blood pressure 119/89, pulse 67, temperature 98 F (36.7 C), resp. rate 19, height 5\' 10"  (1.778 m), weight 83.6 kg, SpO2 98 %.  Medical Problem List and Plan: 1.Slurred speech left side gazesecondary to left MCA infarctionWernicke's area secondary to left mid ICA short segment high-grade stenosis on 10/01/2018. Patient has hx ofprior right MCA infarction as well as VP shunt for hydrocephalus  Continue CIR  Team conference today to discuss current and goals and coordination of care, home and environmental barriers, and discharge planning with nursing, case manager, and therapies.  2. Antithrombotics: -DVT/anticoagulation:SCDs. Venous Dopplers negative -antiplatelet therapy: Aspirin 81 mg daily 3. Pain Management:Tylenol as needed.    Topamax increased to twice daily for headaches, decreased to nightly on 9/23.  Appears controlled on 9/23 4. Mood:Provide emotional support -antipsychotic agents: N/A 5. Neuropsych: This patientis notcapable of making decisions on hisown behalf. 6. Skin/Wound Care:Routine skin checks 7. Fluids/Electrolytes/Nutrition:Routine I/Os.    BMP within acceptable range on 9/21, however creatinine?  Trending up, will order labs for the end of the week 8. Seizure disorder. Vimpat 100 mg twice daily, Depakote 1000 mg twice daily. EEG negative  No seizures since admission to rehab- 9/23 9. Post stroke dysphagia. Follow-up speech therapy. Diet has been advanced to regular as of 10/04/2018. 10. Hypertension.  Patient on metoprolol 25 mg twice daily prior to admission. Resume as needed Vitals:   10/16/18 1919 10/17/18 0542  BP: 117/85 119/89  Pulse: 96 67  Resp: 17 19  Temp: 97.9 F (36.6 C) 98 F (36.7 C)  SpO2: 97% 98%   Resume metoprolol at 12.5mg  bid on 9/21  Controlled on 9/23 11. Hyperlipidemia. Lipitor 80 mg daily 12. Neurogenic bladder. generally continent. 13. Chronic  thrombocytopenia.   Platelets 139 on 9/21  Continue to monitor    LOS: 12 days A FACE TO FACE EVALUATION WAS PERFORMED  Patrick Franklin 10/21 10/17/2018, 9:21 AM

## 2018-10-17 NOTE — Progress Notes (Signed)
Physical Therapy Session Note  Patient Details  Name: Patrick Franklin MRN: [id number] Date of Birth: 09/01/1970  Today's Date: 10/17/2018 PT Individual Time: 0805-0915 PT Individual Time Calculation (min): 70 min   Short Term Goals: Week 1:  PT Short Term Goal 1 (Week 1): Pt will increase bed mobility to mod A. PT Short Term Goal 1 - Progress (Week 1): Met PT Short Term Goal 2 (Week 1): Pt will increase transfers to mod A. PT Short Term Goal 2 - Progress (Week 1): Met PT Short Term Goal 3 (Week 1): Pt will ambulate with LRAD about 10 feet with max A. PT Short Term Goal 3 - Progress (Week 1): Met Week 2:  PT Short Term Goal 1 (Week 2): STG=LTG due to ELOS  Skilled Therapeutic Interventions/Progress Updates:  Pt asleep in bed upon arrival for therapy. Pt able to be aroused after multiple attempts. SPT donned pt's socks, shoes, and L AFO. Pt transferred from bed>wheelchair with hemiwalker and modA. Pt transported to the therapy gym. Pt ambulated 20 feetx2 with hemiwalker and mod-maxA. [name] L knee tends to buckle while ambulating. Pt required verbal cueing for sequencing of task, coordination, step length, and step width. Then white sitting upright in wheelchair, pt attempted to play a game of Connect Four. Pt able to put yellow pieces placed on pt's L side into the game board. Pt unable to follow task of making a pattern with yellow and red pieces. Pt able to clean up yellow and red pieces and put them back in the box after many verbal cues. Overall, pt able to complete most 1 step commands but not two step commands. Then pt completed sit>stand with hemiwalker and modA. Pt sustained standing with hemiwalker and modA while reaching for bean bags on R side and tossing them into corn hole approximately 6 feet away. Pt sat down back into wheelchair with modA and transported back to room. NT present to set up pt with his breakfast. Pt left sitting up in wheelchair with safety belt in place and call  bell in reach.    Therapy Documentation Precautions:  Precautions Precautions: Fall Precaution Comments: Lt hemi + Lt hemianopsia at baseline Restrictions Weight Bearing Restrictions: No  Therapy Vitals Temp: 97.7 F (36.5 C) Temp Source: Oral Pulse Rate: 73 Resp: 20 BP: 120/87 Patient Position (if appropriate): Lying Oxygen Therapy SpO2: 98 % O2 Device: Room AirPain: Denies pain     Therapy/Group: Individual Therapy  Olena Leatherwood, SPT  10/17/2018, 3:32 PM

## 2018-10-17 NOTE — Progress Notes (Signed)
Occupational Therapy Session Note  Patient Details  Name: Patrick Franklin MRN: [id number] Date of Birth: 12/19/1970  Today's Date: 10/17/2018 OT Individual Time: 1415-1510 OT Individual Time Calculation (min): 55 min    Short Term Goals: Week 2:  OT Short Term Goal 1 (Week 2): STGs=LTGs due to ELOS  Skilled Therapeutic Interventions/Progress Updates:    Upon entering the room, pt supine in bed and sleeping soundly. Pt is agreeable to OT intervention with coaxing. Pt needing max cuing throughout session secondary to tangential speech and internally distracted. Mod A for supine >sit. Pt bathing while seated on EOB S- min guard for dynamic sitting balance. Pt needing assistance to wash R UE, buttocks, and B LEs. Pt needing multimodal cuing to attend to task. Max A for UB dressing with hemiplegic technique and therapist providing total A to don L UE sublux sling. Pt standing with mod A and total A to pull pants over B hips. Pt verbalized need to urinal and needing total A for clothing management and urinal placement but unable to void. Pt returning to supine at end of session with call bell and all needed items within reach. Pt very fatigued and asleep as OT exited the room.   Therapy Documentation Precautions:  Precautions Precautions: Fall Precaution Comments: Lt hemi + Lt hemianopsia at baseline Restrictions Weight Bearing Restrictions: No General:   Vital Signs: Therapy Vitals Temp: 97.7 F (36.5 C) Temp Source: Oral Pulse Rate: 73 Resp: 20 BP: 120/87 Patient Position (if appropriate): Lying Oxygen Therapy SpO2: 98 % O2 Device: Room Air Pain: Pain Assessment Pain Score: 0-No pain ADL: ADL Eating: Not assessed Grooming: Moderate assistance(combing hair) Where Assessed-Grooming: Chair Upper Body Bathing: Maximal assistance Where Assessed-Upper Body Bathing: Edge of bed Lower Body Bathing: Dependent(2 assist) Where Assessed-Lower Body Bathing: Edge of bed Upper Body  Dressing: Maximal assistance Where Assessed-Upper Body Dressing: Edge of bed Lower Body Dressing: Dependent(2 assist) Where Assessed-Lower Body Dressing: Edge of bed Toileting: Dependent(using Stedy) Where Assessed-Toileting: Bedside Commode Toilet Transfer: Maximal assistance Toilet Transfer Method: Stand pivot Science writer: Engineer, technical sales Transfer: Not assessed   Therapy/Group: Individual Therapy  Gypsy Decant 10/17/2018, 3:36 PM

## 2018-10-17 NOTE — Patient Care Conference (Signed)
Inpatient RehabilitationTeam Conference and Plan of Care Update Date: 10/17/2018   Time: 10:50 Am    Patient Name: Patrick Franklin      Medical Record Number: [id number]  Date of Birth: 10-Apr-1970 Sex: Male         Room/Bed: 4W10C/4W10C-01 Payor Info: Payor: MEDICARE / Plan: MEDICARE PART A AND B / Product Type: *No Product type* /    Admit Date/Time:  10/05/2018  4:42 PM  Primary Diagnosis:  Left middle cerebral artery stroke [facility])  Patient Active Problem List   Diagnosis Date Noted   Blood pressure increase diastolic    Seizures (HCC)    Vascular headache    Left middle cerebral artery stroke ([place]) 10/05/2018   Cerebral embolism with cerebral infarction 10/03/2018   Neurological deficit present 10/01/2018   Thrombocytopenia ([place]) 10/01/2018   Neurogenic bladder 10/01/2018   Medicare annual wellness visit, subsequent 07/23/2018   Spastic hemiplegia affecting nondominant side ([place]) 03/26/2018   Impaired decision making 08/30/2017   Aorto-iliac atherosclerosis ([place]) 08/29/2017   History of renal stone 08/22/2017   Seizure disorder ([place]) 08/09/2017   Hemiparesis affecting left side as late effect of stroke ([place]) 08/09/2017   Essential hypertension 05/15/2017   Urinary retention 05/15/2017   Hyperlipidemia 05/15/2017   Hx of ischemic right MCA stroke 05/15/2017   MDD (major depressive disorder) 05/15/2017    Expected Discharge Date: Expected Discharge Date: (SNF)  Team Members Present: Physician leading conference: Dr. Delice Lesch Social Worker Present: Ovidio Kin, LCSW Nurse Present: Dietrich Pates, RN PT Present: Barrie Folk, PT;Rosita Dechalus, PTA OT Present: Darleen Crocker, OT SLP Present: Charolett Bumpers, SLP PPS Coordinator present : Gunnar Fusi, SLP     Current Status/Progress Goal Weekly Team Focus  Bowel/Bladder   cont B&B during day, incont at night, difficulty expressing need to void and needs mod-max assistance to hold urinal,  LBM 9/21  assist pt in communicating need to void  offer urinal q2h during day and prn at night   Swallow/Nutrition/ Hydration             ADL's   Max A UB dressing, Mod A BSC transfers using hemi walker, Mod A sit<stand, Min A eating. Max A for self care tasks overall.  Mod A overall  NMR, functional transfers, ADL retraining, cognition   Mobility   minA bed mobilty, minA STS, minA stand pivot with HW, min/modA gait 20-25 ft with HW  min A transfers, mod A gait  transfers, gait, d/c planning   Communication   Min A for wants and needs  Supervision  communication of wants/needs   Safety/Cognition/ Behavioral Observations  Fluctuating Mod-Max, depending on alertness/engagement/familiarity with task  Mod A basic cog  sustained attention, basic functional problem solving, 1-step directions   Pain   pt grimaces with moving of left foot, has tylenol prn  pain remain <4 on faces scale  assist with pain qshift and prn   Skin   no skin issues, has sling to left shoulder and splint at night to left hand  skin remain free from breakdown  assess under devices qshift and prn to keep skin CDI      *See Care Plan and progress notes for long and short-term goals.     Barriers to Discharge  Current Status/Progress Possible Resolutions Date Resolved   Nursing                  PT  OT Incontinence;Neurogenic Bowel & Bladder;Behavior                SLP                SW                Discharge Planning/Teaching Needs:  Plan has changed to NHP sister feels needs more rehab and he is too much care for her and family at home. Will begin NH search.      Team Discussion:  Progressing some in therapies, but starting to stagnant. Lethargic today MD adjusting topamax. BP controlled. Min-mod level in both PT and OT. Will start pursuing NH bed. Pt engages at times and others is very distant. Sister visits daily and is very supportive.  Revisions to Treatment Plan:  NHP    Medical  Summary Current Status: Slurred speech left side gaze secondary to left MCA infarction Wernicke's area secondary to left mid ICA short segment high-grade stenosis on 10/01/2018. Patient has hx of  prior right MCA infarction as well as  VP shunt for hydrocephalus Weekly Focus/Goal: Improve mobility, cognition, lethargy, self-care, HTN  Barriers to Discharge: Medical stability;Behavior;Decreased family/caregiver support   Possible Resolutions to Barriers: Therapies, decrease topomax   Continued Need for Acute Rehabilitation Level of Care: The patient requires daily medical management by a physician with specialized training in physical medicine and rehabilitation for the following reasons: Direction of a multidisciplinary physical rehabilitation program to maximize functional independence : Yes Medical management of patient stability for increased activity during participation in an intensive rehabilitation regime.: Yes Analysis of laboratory values and/or radiology reports with any subsequent need for medication adjustment and/or medical intervention. : Yes   I attest that I was present, lead the team conference, and concur with the assessment and plan of the team. Teleconference held due to COVID 19   [place] Sigmon, Lemar Livings 10/17/2018, 1:17 PM

## 2018-10-17 NOTE — Progress Notes (Signed)
Speech Language Pathology Daily Session Note  Patient Details  Name: Patrick Franklin MRN: [id number] Date of Birth: [birth year]  Today's Date: 10/17/2018 SLP Individual Time: 1101-1200 SLP Individual Time Calculation (min): 59 min  Short Term Goals: Week 2: SLP Short Term Goal 1 (Week 2): STG = LTG due to LOS  Skilled Therapeutic Interventions: Pt was seen for skilled ST targeting cognition. Pt demonstrated overall improved abilities with basic problem solving and sustained attention today. When fully alert, he sustained attention to tasks for ~2-5 minute intervals. SLP provided Min-Mod A verbal and tactile cues for basic problem solving and sequencing during basic functional tasks such as eating a snack and attempting to use the urinal (although pt did not void). Pt sorted coins (4 types) with overall Mod A verbal and visual cues for error awareness and correction - greatly improved since last targeted session. When prompted to count pennies, pt would not engage in task, however he later spontaneously counted pennies in his head and wrote "10" on a piece of paper (accurate answer). Pt continues to intermittently refuse to respond to questions verbally, however was more easily redirectable overall in comparison to previous visits. Prior to end of session, pt verbally described how to use call bell to request assistance with Min-Mod A question cues. Pt was left in bed with alarm set and call bell within reach. Continue per current plan of care.   Pain Pain Assessment Pain Scale: Faces Pain Score: 0-No pain Faces Pain Scale: No hurt  Therapy/Group: Individual Therapy  Arbutus Leas 10/17/2018, 11:56 AM

## 2018-10-17 NOTE — Progress Notes (Signed)
Social Work Patient ID: Patrick Franklin, male   DOB: 04/23/1970, 48 y.o.   MRN: [id number]  Met with sister to discuss team conference progression toward his goals and readiness to move on to the next level. She is looking at [facility] facilities and will let me know her preference. Pt is lethargic today but seems to go in and out, MD is adjusting meds.

## 2018-10-18 ENCOUNTER — Inpatient Hospital Stay (HOSPITAL_COMMUNITY): Payer: 59 | Admitting: Occupational Therapy

## 2018-10-18 ENCOUNTER — Inpatient Hospital Stay (HOSPITAL_COMMUNITY): Payer: 59 | Admitting: Speech Pathology

## 2018-10-18 ENCOUNTER — Inpatient Hospital Stay (HOSPITAL_COMMUNITY): Payer: 59 | Admitting: Physical Therapy

## 2018-10-18 DIAGNOSIS — F0630 Mood disorder due to known physiological condition, unspecified: Secondary | ICD-10-CM

## 2018-10-18 DIAGNOSIS — R7989 Other specified abnormal findings of blood chemistry: Secondary | ICD-10-CM

## 2018-10-18 NOTE — Progress Notes (Signed)
[name] PHYSICAL MEDICINE & REHABILITATION PROGRESS NOTE   Subjective/Complaints: Patient seen laying in bed this morning.  No reported issues overnight.  He briefly arouses.  ROS: Limited due to cognition  Objective:   No results found. Recent Labs    10/15/18 0936  WBC 4.1  HGB 15.6  HCT 48.4  PLT 113*   Recent Labs    10/15/18 0936  NA 142  K 3.8  CL 107  CO2 25  GLUCOSE 111*  BUN 7  CREATININE 1.08  CALCIUM 8.6*    Intake/Output Summary (Last 24 hours) at 10/18/2018 0846 Last data filed at 10/18/2018 0035 Gross per 24 hour  Intake 360 ml  Output 450 ml  Net -90 ml     Physical Exam: Vital Signs Blood pressure 128/90, pulse 67, temperature 98.2 F (36.8 C), resp. rate 18, height 5\' 10"  (1.778 m), weight 83.6 kg, SpO2 100 %. Constitutional: No distress . Vital signs reviewed. HENT: Right craniectomy Eyes: EOMI. No discharge. Cardiovascular: No JVD. Respiratory: Normal effort.  No stridor. GI: Non-distended. Skin: Warm and dry.  Intact. Psych: Limited due to cognition/arousal Musc: No edema in extremities.  No tenderness in extremities. Neurologic: Alert Motor examination limited due to cognition/arousal  Assessment/Plan: 1. Functional deficits secondary to new posterior left MCA distribution infarct in the setting of prior right MCA infarct with chronic left hemiparesis which require 3+ hours per day of interdisciplinary therapy in a comprehensive inpatient rehab setting.  Physiatrist is providing close team supervision and 24 hour management of active medical problems listed below.  Physiatrist and rehab team continue to assess barriers to discharge/monitor patient progress toward functional and medical goals  Care Tool:  Bathing    Body parts bathed by patient: Face, Right upper leg, Left upper leg, Chest, Front perineal area, Abdomen, Left arm   Body parts bathed by helper: Right arm, Buttocks, Right lower leg, Left lower leg Body parts n/a:  Right lower leg, Left lower leg   Bathing assist Assist Level: Moderate Assistance - Patient 50 - 74%     Upper Body Dressing/Undressing Upper body dressing   What is the patient wearing?: Pull over shirt    Upper body assist Assist Level: Maximal Assistance - Patient 25 - 49%    Lower Body Dressing/Undressing Lower body dressing      What is the patient wearing?: Pants     Lower body assist Assist for lower body dressing: Total Assistance - Patient < 25%     Toileting Toileting    Toileting assist Assist for toileting: Maximal Assistance - Patient 25 - 49%     Transfers Chair/bed transfer  Transfers assist     Chair/bed transfer assist level: Moderate Assistance - Patient 50 - 74%     Locomotion Ambulation   Ambulation assist      Assist level: Moderate Assistance - Patient 50 - 74% Assistive device: Walker-hemi Max distance: 20   Walk 10 feet activity   Assist  Walk 10 feet activity did not occur: Safety/medical concerns  Assist level: Moderate Assistance - Patient - 50 - 74% Assistive device: Walker-hemi   Walk 50 feet activity   Assist Walk 50 feet with 2 turns activity did not occur: Safety/medical concerns         Walk 150 feet activity   Assist Walk 150 feet activity did not occur: Safety/medical concerns         Walk 10 feet on uneven surface  activity   Assist Walk 10  feet on uneven surfaces activity did not occur: Safety/medical concerns         Wheelchair     Assist Will patient use wheelchair at discharge?: Yes Type of Wheelchair: Manual Wheelchair activity did not occur: Safety/medical concerns         Wheelchair 50 feet with 2 turns activity    Assist    Wheelchair 50 feet with 2 turns activity did not occur: Safety/medical concerns       Wheelchair 150 feet activity     Assist  Wheelchair 150 feet activity did not occur: Safety/medical concerns       Blood pressure 128/90, pulse 67,  temperature 98.2 F (36.8 C), resp. rate 18, height 5\' 10"  (1.778 m), weight 83.6 kg, SpO2 100 %.  Medical Problem List and Plan: 1.Slurred speech left side gazesecondary to left MCA infarctionWernicke's area secondary to left mid ICA short segment high-grade stenosis on 10/01/2018. Patient has hx ofprior right MCA infarction as well as VP shunt for hydrocephalus  Continue CIR 2. Antithrombotics: -DVT/anticoagulation:SCDs. Venous Dopplers negative -antiplatelet therapy: Aspirin 81 mg daily 3. Pain Management:Tylenol as needed.    Topamax increased to twice daily for headaches, decreased to nightly on 9/24.  Appears controlled on 9/24 4. Mood:Provide emotional support -antipsychotic agents: N/A 5. Neuropsych: This patientis notcapable of making decisions on hisown behalf. 6. Skin/Wound Care:Routine skin checks 7. Fluids/Electrolytes/Nutrition:Routine I/Os.    BMP within acceptable range on 9/21, however creatinine?  Trending up, labs ordered for tomorrow 8. Seizure disorder. Vimpat 100 mg twice daily, Depakote 1000 mg twice daily. EEG negative  No seizures since admission to rehab- 9/24 9. Post stroke dysphagia. Follow-up speech therapy. Diet has been advanced to regular as of 10/04/2018. 10. Hypertension.  Patient on metoprolol 25 mg twice daily prior to admission. Resume as needed Vitals:   10/17/18 2029 10/18/18 0414  BP: (!) 130/95 128/90  Pulse: 75 67  Resp: 18 18  Temp: 98.2 F (36.8 C) 98.2 F (36.8 C)  SpO2: 98% 100%   Resume metoprolol at 12.5mg  bid on 9/21  Relatively controlled on 9/24 11. Hyperlipidemia. Lipitor 80 mg daily 12. Neurogenic bladder. generally continent. 13. Chronic thrombocytopenia.   Platelets 113 on 9/21, labs ordered for tomorrow  Continue to monitor    LOS: 13 days A FACE TO FACE EVALUATION WAS PERFORMED  Patrick Franklin 10/21 10/18/2018, 8:46 AM

## 2018-10-18 NOTE — Progress Notes (Signed)
Speech Language Pathology Daily Session Note  Patient Details  Name: Patrick Franklin MRN: [id number] Date of Birth: 18-Mar-1970  Today's Date: 10/18/2018 SLP Individual Time: 1100-1155 SLP Individual Time Calculation (min): 55 min  Short Term Goals: Week 2: SLP Short Term Goal 1 (Week 2): STG = LTG due to LOS  Skilled Therapeutic Interventions: Pt was seen for skilled ST targeting cognition. Upon entrance to room, pt requested a snack and selected graham cracker when presented with 2 choices. Pt consumed snack with set up assist for self feeding. SLP attempted to engage pt in basic PEG board task, however pt was unable to follow directions even with Max A-Total cues (task determined to be too complex for pt at this time). During a familiar card game (WAR), pt was able to identify the higher number (out of 2) with fluctuating Mod A verbal cues for basic problem solving and error awareness - he completed task with 70% accuracy. Pt also sorted cards by color (black or red) with only Min A initiation - completed with 100% accuracy. Pt completed both card tasks in their entirety, with Mod-Max A verbal cues for redirection, which is improved over previous sessions (he has never been able to complete any 1 task in its entirety before). Pt was still distractible and somewhat perseverative on talking about his power of attorney, however he redirectable with Mod-Max A verbal cues. Pt verbally recalled functions of call bell buttons (TV and nurse) with Mod A question cues. Of note, pt was able to maintain alertness while sitting upright in his wheelchair far better today in comparison to previous sessions. Pt was left in wheelchair with call bell in lap, all needs within reach. Continue per current plan of care.       Pain Pain Assessment Pain Scale: Faces Pain Score: 0-No pain Faces Pain Scale: No hurt  Therapy/Group: Individual Therapy  Arbutus Leas 10/18/2018, 12:09 PM

## 2018-10-18 NOTE — NC FL2 (Signed)
[place] LEVEL OF CARE SCREENING TOOL     IDENTIFICATION  Patient Name: Patrick Franklin Birthdate: 08-25-1970 Sex: male Admission Date (Current Location): 10/05/2018  [facility] and [place] Number:  Herbalist and Address:  [facility], [place] [address] [place], [place] [address]      Provider Number: [id number]  Attending Physician Name and Address:  Charlett Blake, MD  Relative Name and Phone Number:  Oletta Darter [phone]    Current Level of Care: Other (Comment)(rehab) Recommended Level of Care: [place] Prior Approval Number:    Date Approved/Denied:   PASRR Number: [id number]  Discharge Plan: SNF    Current Diagnoses: Patient Active Problem List   Diagnosis Date Noted   Elevated serum creatinine    Blood pressure increase diastolic    Seizures (HCC)    Vascular headache    Left middle cerebral artery stroke ([place]) 10/05/2018   Cerebral embolism with cerebral infarction 10/03/2018   Neurological deficit present 10/01/2018   Thrombocytopenia ([place]) 10/01/2018   Neurogenic bladder 10/01/2018   Medicare annual wellness visit, subsequent 07/23/2018   Spastic hemiplegia affecting nondominant side ([place]) 03/26/2018   Impaired decision making 08/30/2017   Aorto-iliac atherosclerosis ([place]) 08/29/2017   History of renal stone 08/22/2017   Seizure disorder ([place]) 08/09/2017   Hemiparesis affecting left side as late effect of stroke ([place]) 08/09/2017   Essential hypertension 05/15/2017   Urinary retention 05/15/2017   Hyperlipidemia 05/15/2017   Hx of ischemic right MCA stroke 05/15/2017   MDD (major depressive disorder) 05/15/2017    Orientation RESPIRATION BLADDER Height & Weight     Self, Place, Situation  Normal Continent Weight: 184 lb 4.9 oz (83.6 kg) Height:  5\' 10"  (177.8 cm)  BEHAVIORAL SYMPTOMS/MOOD NEUROLOGICAL BOWEL NUTRITION STATUS      Continent  Diet(regular thin liquid diet)  AMBULATORY STATUS COMMUNICATION OF NEEDS Skin   Extensive Assist Verbally Normal                       Personal Care Assistance Level of Assistance  Bathing, Feeding, Dressing Bathing Assistance: Limited assistance Feeding assistance: Limited assistance(supervision) Dressing Assistance: Limited assistance     Functional Limitations Info  Speech, Sight Sight Info: Impaired   Speech Info: Impaired    SPECIAL CARE FACTORS FREQUENCY  PT (By licensed PT), OT (By licensed OT), Speech therapy     PT Frequency: 5x week OT Frequency: 5x week     Speech Therapy Frequency: 5x week      Contractures Contractures Info: Present    Additional Factors Info  Code Status, Allergies Code Status Info: DNR Allergies Info: versed-Midazolam           Current Medications (10/18/2018):  This is the current hospital active medication list Current Facility-Administered Medications  Medication Dose Route Frequency Provider Last Rate Last Dose   acetaminophen (TYLENOL) tablet 650 mg  650 mg Oral Q4H PRN Cathlyn Parsons, PA-C   650 mg at 10/17/18 4696   Or   acetaminophen (TYLENOL) solution 650 mg  650 mg Per Tube Q4H PRN Angiulli, Lavon Paganini, PA-C       Or   acetaminophen (TYLENOL) suppository 650 mg  650 mg Rectal Q4H PRN Angiulli, Lavon Paganini, PA-C       aspirin EC tablet 81 mg  81 mg Oral Daily Cathlyn Parsons, PA-C   81 mg at 10/18/18 0920   atorvastatin (LIPITOR) tablet 80 mg  80 mg Oral QHS Charlton Amor, PA-C   80 mg at 10/17/18 2225   divalproex (DEPAKOTE) DR tablet 1,000 mg  1,000 mg Oral Q12H Charlton Amor, PA-C   1,000 mg at 10/18/18 8850   lacosamide (VIMPAT) tablet 200 mg  200 mg Oral BID Charlton Amor, PA-C   200 mg at 10/18/18 0920   metoprolol tartrate (LOPRESSOR) tablet 12.5 mg  12.5 mg Oral BID Faith Rogue T, MD   12.5 mg at 10/18/18 0919   sorbitol 70 % solution 30 mL  30 mL Oral Daily PRN Charlton Amor,  PA-C   30 mL at 10/14/18 1956   topiramate (TOPAMAX) tablet 25 mg  25 mg Oral QHS Marcello Fennel, MD         Discharge Medications: Please see discharge summary for a list of discharge medications.  Relevant Imaging Results:  Relevant Lab Results:   Additional Information SSN: [id number]  Jarel Cuadra, Lemar Livings, LCSW

## 2018-10-18 NOTE — Progress Notes (Signed)
Physical Therapy Session Note  Patient Details  Name: Patrick Franklin MRN: [id number] Date of Birth: [birth year]  Today's Date: 10/18/2018 PT Individual Time: 0802-0900 PT Individual Time Calculation (min): 58 min   Short Term Goals: Week 1:  PT Short Term Goal 1 (Week 1): Pt will increase bed mobility to mod A. PT Short Term Goal 1 - Progress (Week 1): Met PT Short Term Goal 2 (Week 1): Pt will increase transfers to mod A. PT Short Term Goal 2 - Progress (Week 1): Met PT Short Term Goal 3 (Week 1): Pt will ambulate with LRAD about 10 feet with max A. PT Short Term Goal 3 - Progress (Week 1): Met Week 2:  PT Short Term Goal 1 (Week 2): STG=LTG due to ELOS  Skilled Therapeutic Interventions/Progress Updates:  Pt asleep in bed upon arrival for therapy. Pt able to be around after multiple attempts. SPT donned pt pants and socks while pt was lying down in bed. Pt transferred supine>sitting EOB with minA and SPT donned pt's shoes and L AFO. Pt performed a stand-pivot transfer hemiwalker and minA and was transported to the therapy gym. Pt completed sit>stand and then ambulated 20 feetx2 with hemiwalker and minA. Pt required min-mod verbal cueing for sequencing and coordination of task. Then, pt was transported in wheelchair to other therapy gym and completed car transfer with hemiwalker and min-modA. Pt completed transfers <> wheelchair and mat table with hemiwalker and minA. Pt required min-mod verbal cueing for sequencing and coordination of task. Pt was able to propel wheelchair 150 feet back towards his room with R UE and LE and with CGA to sustain task and avoid obstacles on L side. Pt became distracted in the hallway multiple times and required verbal cueing throughout task. Pt returned to his room and left sitting up in wheelchair with safety belt in place, tray table in front, and call bell in reach. NT coming into room as SPT and PT leaving to get pt set up with breakfast.      Therapy  Documentation Precautions:  Precautions Precautions: Fall Precaution Comments: Lt hemi + Lt hemianopsia at baseline Restrictions Weight Bearing Restrictions: No   Pain: denies pain        Therapy/Group: Individual Therapy  Olena Leatherwood, SPT  10/18/2018, 10:12 AM

## 2018-10-18 NOTE — Progress Notes (Signed)
Occupational Therapy Session Note  Patient Details  Name: Patrick Franklin MRN: [id number] Date of Birth: 09/04/1970  Today's Date: 10/18/2018 OT Individual Time: 9518-8416 OT Individual Time Calculation (min): 71 min    Short Term Goals: Week 1:  OT Short Term Goal 1 (Week 1): Pt will maintain sustained attention to 1 ADL task for 30 seconds with mod vcs OT Short Term Goal 1 - Progress (Week 1): Met OT Short Term Goal 2 (Week 1): Pt will sit EOB/EOM for 20 minutes during functional activity with no more than Min A for sitting balance OT Short Term Goal 2 - Progress (Week 1): Met  Skilled Therapeutic Interventions/Progress Updates:    Upon entering the room, pt seated in wheelchair and attempting to get to sink for grooming tasks. OT assisted pt and he brushed teeth with set up A. Pt shaving face with electric razor with max multimodal cuing to attend to task and hand over hand assistance to get started. Pt taken to bathroom via wheelchair and transferred with mod A stand pivot transfer with max A for LB clothing management. Pt unable to void and returning to wheelchair. Pt having facetime psych call as pt reports suicidal ideation and OT assisting with communication between staff and patient. Pt shakes head "yes" and "no" for some questions and then verbalizes, " I don't understanding what you are asking me". Once pt returning to room with therapist, OT asking some of the same questions to pt as well as questions regarding his current situation and family dynamics. Pt has normal conversation with therapist, shows insight, memory, and problem solving abilities. This leads me to believe pt has been manipulating staff with his behaviors while on this unit. OT notified various staff members. Stand pivot transfer from wheelchair >bed with mod A and OT assisting pt with removing shoes and AFO. Sit >supine with supervision. Bed alarm activated and call bell within reach.   Therapy  Documentation Precautions:  Precautions Precautions: Fall Precaution Comments: Lt hemi + Lt hemianopsia at baseline Restrictions Weight Bearing Restrictions: No General:   Vital Signs: Therapy Vitals Temp: (!) 97.5 F (36.4 C) Temp Source: Oral Pulse Rate: 81 Resp: 16 BP: (!) 125/96 Patient Position (if appropriate): Lying Oxygen Therapy SpO2: 99 % O2 Device: Room Air Pain: Pain Assessment Pain Scale: Faces Faces Pain Scale: No hurt ADL: ADL Eating: Not assessed Grooming: Moderate assistance(combing hair) Where Assessed-Grooming: Chair Upper Body Bathing: Maximal assistance Where Assessed-Upper Body Bathing: Edge of bed Lower Body Bathing: Dependent(2 assist) Where Assessed-Lower Body Bathing: Edge of bed Upper Body Dressing: Maximal assistance Where Assessed-Upper Body Dressing: Edge of bed Lower Body Dressing: Dependent(2 assist) Where Assessed-Lower Body Dressing: Edge of bed Toileting: Dependent(using Stedy) Where Assessed-Toileting: Bedside Commode Toilet Transfer: Maximal assistance Toilet Transfer Method: Stand pivot Acupuncturist: Animator Transfer: Not assessed   Therapy/Group: Individual Therapy  Alen Bleacher 10/18/2018, 3:21 PM

## 2018-10-18 NOTE — Progress Notes (Signed)
Physical Therapy Weekly Progress Note  Patient Details  Name: Patrick Franklin MRN: [id number] Date of Birth: 10/16/1970  Beginning of progress report period: October 12, 2018 End of progress report period: October 18, 2018  Today's Date: 10/18/2018  Patient has met  1 of 1 short term goals and continues to progress towards LTG with minA overall.  Pt remains distracted during PT treatment sessions. Pt is inconsistent with gait patterns ranging from min-modA. Pt requires CGA with max cueing for safety awareness for bed mobility and transfers. Pt requires supervision-CGA for wheelchair propulsion when able to sustain the task. Pt has made progress but has difficulty with vision and sequencing and coordination with tasks as well as confusion which serve as limiting factors towards progress at this time.   Patient continues to demonstrate the following deficits muscle weakness, muscle joint tightness and muscle paralysis, impaired timing and sequencing, abnormal tone, unbalanced muscle activation, motor apraxia, ataxia, decreased coordination and decreased motor planning, decreased visual acuity, decreased visual perceptual skills, decreased visual motor skills and field cut, decreased attention to left, left side neglect and decreased motor planning, decreased initiation, decreased attention, decreased awareness, decreased problem solving, decreased safety awareness, decreased memory and delayed processing and decreased balance strategies and therefore will continue to benefit from skilled PT intervention to increase functional independence with mobility.  Patient progressing toward long term goals..  Continue plan of care.  PT Short Term Goals Week 1:  PT Short Term Goal 1 (Week 1): Pt will increase bed mobility to mod A. PT Short Term Goal 1 - Progress (Week 1): Met PT Short Term Goal 2 (Week 1): Pt will increase transfers to mod A. PT Short Term Goal 2 - Progress (Week 1): Met PT Short  Term Goal 3 (Week 1): Pt will ambulate with LRAD about 10 feet with max A. PT Short Term Goal 3 - Progress (Week 1): Met Week 2:  PT Short Term Goal 1 (Week 2): STG=LTG due to ELOS    Therapy Documentation Precautions:  Precautions Precautions: Fall Precaution Comments: Lt hemi + Lt hemianopsia at baseline Restrictions Weight Bearing Restrictions: No    Pain: denies pain    Therapy/Group: Individual Therapy  Olena Leatherwood, SPT  10/18/2018, 10:35 AM

## 2018-10-18 NOTE — Consult Note (Signed)
Telepsych Consultation   Reason for Consult:  "Patient with suicidal ideations"  Referring Physician:  Dr. Claudette Laws  Location of Patient:  [facility] Location of Provider: [facility]  Patient Identification: Patrick Franklin MRN:  [id number] Principal Diagnosis: Mood disorder [facility]) Diagnosis:  Principal Problem:   Left middle cerebral artery stroke (HCC) Active Problems:   Blood pressure increase diastolic   Seizures (HCC)   Vascular headache   Elevated serum creatinine   Total Time spent with patient: 1 hour  Subjective:   Patrick Franklin is a 48 y.o. male patient admitted to CIR.  HPI:   Per chart review, patient was admitted to CIR after treatment of left MCA stroke complicated by dysphagia. He has a history of stroke with left sided residual deficits, neurogenic bowel and bladder. Psychiatry is consulted because patient made a comment about stepping out in front of a bus to his nurse. He has a history of depression. Home medications include Klonopin 0.5 mg daily PRN for seizures, Depakote 1000 mg BID, Doxazosin 1 mg qhs for urinary retention, Cymbalta 60 mg daily for depression and Melatonin 3-6 mg qhs.   On interview, Patrick Franklin is childlike in behavior. He has poor attention span, tangential speech and significant cognitive deficits. He reports that his back hurts and he is unable to get up. He reports reports that he will jump out of a window when he is able to stand up. He denies HI. He reports, "I don't know what you mean" when asked about AVH or a history of harming himself. Nursing reports that he sleeps throughout the day and often in the daytime as well. He is eating well. His sister is his caregiver. He has made negative statements about her over the past few days and she has not came to visit so she wonders if there is a family dynamic problem.   Patient's sister, Willette Pa ([phone]) was contacted by phone. She reports that he is very angry  since he had another stroke. She reports that he has been abrupt with her when she comes to visit. His sister reports that he only directs his anger towards her but has not been harsh to other family members. He was started on Depakote for anger outbursts after his first stroke. He reported to her that he wanted to die. He has never acted on these thoughts. He endorsed SI after his first stroke as well until he started rehab and physically improved. He did well while attending an adult day program since he was able to interact with people who had similar disabilities. He needs assistance with ADLs and transfers. She would like for him to go to a long term care facility.   Past Psychiatric History: Depression   Risk to Self:  Low  Risk to Others:  None. Denies HI.  Prior Inpatient Therapy:  Denies  Prior Outpatient Therapy:  He is followed by his PCP and neurologist.   Past Medical History:  Past Medical History:  Diagnosis Date   Asthma    Cardiomegaly 04/13/2016   Depression    DVT (deep venous thrombosis) (HCC)    GERD (gastroesophageal reflux disease)    Hemiplegia and hemiparesis    Left side   Hypertension    Neurogenic bladder    Neurogenic bowel    Neuropathy    Pulmonary embolism (HCC) 01/13/2016   Seizures (HCC)    Stroke (HCC) 12/31/2015   left side deficits    Subluxation of shoulder joint  04/13/2016   left   UTI (urinary tract infection)     Past Surgical History:  Procedure Laterality Date   CRANIECTOMY  2017   CRANIOPLASTY  2018   IVC FILTER INSERTION  2017   PEG TUBE PLACEMENT  2017   VENTRICULOPERITONEAL SHUNT  2018   Family History:  Family History  Problem Relation Age of Onset   Stroke Maternal Uncle    Stroke Maternal Uncle    Family Psychiatric  History: Sister-depression  Social History:  Social History   Substance and Sexual Activity  Alcohol Use Not Currently   Frequency: Never     Social History   Substance and  Sexual Activity  Drug Use Never    Social History   Socioeconomic History   Marital status: Divorced    Spouse name: Not on file   Number of children: Not on file   Years of education: Not on file   Highest education level: Not on file  Occupational History   Not on file  Social Needs   Financial resource strain: Not on file   Food insecurity    Worry: Not on file    Inability: Not on file   Transportation needs    Medical: Not on file    Non-medical: Not on file  Tobacco Use   Smoking status: Never Smoker   Smokeless tobacco: Never Used  Substance and Sexual Activity   Alcohol use: Not Currently    Frequency: Never   Drug use: Never   Sexual activity: Not on file  Lifestyle   Physical activity    Days per week: Not on file    Minutes per session: Not on file   Stress: Not on file  Relationships   Social connections    Talks on phone: Not on file    Gets together: Not on file    Attends religious service: Not on file    Active member of club or organization: Not on file    Attends meetings of clubs or organizations: Not on file    Relationship status: Not on file  Other Topics Concern   Not on file  Social History Narrative   Single.   4 children.    Once worked as an Teacher, adult education.    Enjoys reading.    Additional Social History: He is divorced x 3. He has 4 children. He has a 65 y/o child in [place] who lives with child's mother. He lives with his sister. His 46 y/o daughter also lives with him. He does not use alcohol or illicit drugs.     Allergies:   Allergies  Allergen Reactions   Versed [Midazolam] Other (See Comments)    Drops blood pressure and heart rate    Labs: No results found for this or any previous visit (from the past 48 hour(s)).  Medications:  Current Facility-Administered Medications  Medication Dose Route Frequency Provider Last Rate Last Dose   acetaminophen (TYLENOL) tablet 650 mg  650 mg Oral Q4H PRN  Charlton Amor, PA-C   650 mg at 10/18/18 1110   Or   acetaminophen (TYLENOL) solution 650 mg  650 mg Per Tube Q4H PRN Angiulli, Mcarthur Rossetti, PA-C       Or   acetaminophen (TYLENOL) suppository 650 mg  650 mg Rectal Q4H PRN Angiulli, Mcarthur Rossetti, PA-C       aspirin EC tablet 81 mg  81 mg Oral Daily Charlton Amor, PA-C   81 mg at 10/18/18 1610  atorvastatin (LIPITOR) tablet 80 mg  80 mg Oral QHS Cathlyn Parsons, PA-C   80 mg at 10/17/18 2225   divalproex (DEPAKOTE) DR tablet 1,000 mg  1,000 mg Oral Q12H AngiulliLavon Paganini, PA-C   1,000 mg at 10/18/18 8413   lacosamide (VIMPAT) tablet 200 mg  200 mg Oral BID Cathlyn Parsons, PA-C   200 mg at 10/18/18 0920   metoprolol tartrate (LOPRESSOR) tablet 12.5 mg  12.5 mg Oral BID Alger Simons T, MD   12.5 mg at 10/18/18 0919   sorbitol 70 % solution 30 mL  30 mL Oral Daily PRN Cathlyn Parsons, PA-C   30 mL at 10/14/18 1956   topiramate (TOPAMAX) tablet 25 mg  25 mg Oral QHS Jamse Arn, MD        Musculoskeletal: Strength & Muscle Tone: History of hemiplegia and hemiparesis secondary to stroke. Gait & Station: UTA since patient is sitting in a wheelchair. Patient leans: N/A  Psychiatric Specialty Exam: Physical Exam  Nursing note and vitals reviewed. Constitutional: He appears well-developed and well-nourished.  HENT:  Head: Normocephalic and atraumatic.  Neck: Normal range of motion.  Respiratory: Effort normal.  Musculoskeletal:     Comments: History of hemiplegia and hemiparesis secondary to stroke  Neurological: He is alert.  Oriented to self and place.  Psychiatric: He has a normal mood and affect. His speech is normal and behavior is normal. Cognition and memory are impaired. He expresses impulsivity. He expresses suicidal ideation.    Review of Systems  Psychiatric/Behavioral: Positive for suicidal ideas. Negative for substance abuse.  All other systems reviewed and are negative.   Blood pressure  128/90, pulse 67, temperature 98.2 F (36.8 C), resp. rate 18, height 5\' 10"  (1.778 m), weight 83.6 kg, SpO2 100 %.Body mass index is 26.44 kg/m.  General Appearance: Fairly Groomed, middle aged, Caucasian male, wearing casual clothes who is sitting in a wheelchair. NAD.   Eye Contact:  Good  Speech:  Clear and Coherent and Slow  Volume:  Normal  Mood:  Euthymic  Affect:  Constricted  Thought Process:  Linear and Descriptions of Associations: Tangential at times when providing irrelevant information.   Orientation:  Other:  Person and place.  Thought Content:  Logical  Suicidal Thoughts: Patient reports thoughts to harm self.   Homicidal Thoughts:  No  Memory:  Immediate;   Fair Recent;   Fair Remote;   Fair  Judgement:  Poor  Insight:  Fair  Psychomotor Activity:  Normal  Concentration:  Concentration: Fair and Attention Span: Fair  Recall:  [employer] of Knowledge:  Poor  Language:  Fair  Akathisia:  No  Handed:  Right  AIMS (if indicated):   N/A  Assets:  Housing Resilience Social Support  ADL's:  Impaired  Cognition: Impaired. He has difficulty with comprehending information.   Sleep:   Okay   Assessment:  Patrick Franklin is a 48 y.o. male who was admitted to CIR after treatment of left MCA stroke. Patient has significant cognitive deficits and difficulty with comprehending information. He is unable to participate in a majority of the assessment. He does report thoughts to harm self. Collateral was obtained from his sister. She reports that he has been endorsing thoughts to harm self but has never acted on them. She reports that he directs his anger towards her regarding his medical condition but no one else. Recent OT note indicates that patient may be manipulating staff with his behaviors as  he is normal in conversation with her and demonstrates understanding of questions asked to him. He is low risk of harming self and appears to be physically incapable of harming himself  at this time as he requires assistance with ambulating. His affect is also incongruent with endorsing such statements which is more indicative of attention seeking behavior. Recommend increasing Cymbalta for mood lability/irritability. If patient demonstrates any unsafe behaviors or there is concern for imminent risk of harm to self please contact psychiatry for reassessment.   Treatment Plan Summary: -Increase Cymbalta 60 mg daily to 90 mg daily for mood lability/irritability.  -EKG reviewed and QTc 436 on 9/7. Please closely monitor when starting or increasing QTc prolonging agents.   -Psychiatry will sign off on patient at this time. Please consult psychiatry again as needed.   Disposition: No evidence of imminent risk to self or others at present.    This service was provided via telemedicine using a 2-way, interactive audio and video technology.  Names of all persons participating in this telemedicine service and their role in this encounter. Name: Juanetta BeetsJacqueline Jasmarie Coppock, DO Role: Psychiatrist   Name: Patrick Mauhristopher Basista Role: Patient    Cherly BeachJacqueline J Emaya Preston, DO 10/18/2018 1:18 PM

## 2018-10-19 ENCOUNTER — Inpatient Hospital Stay (HOSPITAL_COMMUNITY): Payer: 59 | Admitting: Physical Therapy

## 2018-10-19 ENCOUNTER — Inpatient Hospital Stay (HOSPITAL_COMMUNITY): Payer: 59 | Admitting: Occupational Therapy

## 2018-10-19 ENCOUNTER — Inpatient Hospital Stay (HOSPITAL_COMMUNITY): Payer: 59 | Admitting: Speech Pathology

## 2018-10-19 DIAGNOSIS — R45851 Suicidal ideations: Secondary | ICD-10-CM

## 2018-10-19 DIAGNOSIS — F39 Unspecified mood [affective] disorder: Secondary | ICD-10-CM

## 2018-10-19 LAB — BASIC METABOLIC PANEL
Anion gap: 6 (ref 5–15)
BUN: 9 mg/dL (ref 6–20)
CO2: 26 mmol/L (ref 22–32)
Calcium: 8.5 mg/dL — ABNORMAL LOW (ref 8.9–10.3)
Chloride: 111 mmol/L (ref 98–111)
Creatinine, Ser: 0.96 mg/dL (ref 0.61–1.24)
GFR calc Af Amer: >60 mL/min (ref >60)
GFR calc non Af Amer: >60 mL/min (ref >60)
Glucose, Bld: 96 mg/dL (ref 70–99)
Potassium: 3.6 mmol/L (ref 3.5–5.1)
Sodium: 143 mmol/L (ref 135–145)

## 2018-10-19 MED ORDER — DULOXETINE HCL 30 MG PO CPEP
30.0000 mg | ORAL_CAPSULE | Freq: Every day | ORAL | Status: DC
  Administered 2018-10-19 – 2018-10-24 (×6): 30 mg via ORAL
  Filled 2018-10-19 (×6): qty 1

## 2018-10-19 NOTE — Progress Notes (Signed)
[place] PHYSICAL MEDICINE & REHABILITATION PROGRESS NOTE   Subjective/Complaints: Patient seen sitting up in bed this morning.  No reported issues overnight.  Patient is much more alert and engaging this morning, however perseverative and repeating the same question over and over.  Yesterday, there was concern about suicidal ideation and psychiatry was consulted.  ROS: Limited due to cognition  Objective:   No results found. No results for input(s): WBC, HGB, HCT, PLT in the last 72 hours. Recent Labs    10/19/18 0617  NA 143  K 3.6  CL 111  CO2 26  GLUCOSE 96  BUN 9  CREATININE 0.96  CALCIUM 8.5*    Intake/Output Summary (Last 24 hours) at 10/19/2018 0857 Last data filed at 10/19/2018 0042 Gross per 24 hour  Intake 600 ml  Output 325 ml  Net 275 ml     Physical Exam: Vital Signs Blood pressure (!) 126/96, pulse 67, temperature 97.9 F (36.6 C), temperature source Oral, resp. rate 14, height 5\' 10"  (1.778 m), weight 83.6 kg, SpO2 100 %. Constitutional: No distress . Vital signs reviewed. HENT: + Craniectomy Eyes: EOMI. No discharge. Cardiovascular: No JVD. Respiratory: Normal effort.  No stridor. GI: Non-distended. Skin: Warm and dry.  Intact. Psych: Limited to due to cognition Musc: No edema in extremities.  No tenderness in extremities. Neurologic: Alert Motor examination limited due to cognition, but moving right side spontaneously  Assessment/Plan: 1. Functional deficits secondary to new posterior left MCA distribution infarct in the setting of prior right MCA infarct with chronic left hemiparesis which require 3+ hours per day of interdisciplinary therapy in a comprehensive inpatient rehab setting.  Physiatrist is providing close team supervision and 24 hour management of active medical problems listed below.  Physiatrist and rehab team continue to assess barriers to discharge/monitor patient progress toward functional and medical goals  Care  Tool:  Bathing    Body parts bathed by patient: Face, Right upper leg, Left upper leg, Chest, Front perineal area, Abdomen, Left arm   Body parts bathed by helper: Right arm, Buttocks, Right lower leg, Left lower leg Body parts n/a: Right lower leg, Left lower leg   Bathing assist Assist Level: Moderate Assistance - Patient 50 - 74%     Upper Body Dressing/Undressing Upper body dressing   What is the patient wearing?: Pull over shirt    Upper body assist Assist Level: Maximal Assistance - Patient 25 - 49%    Lower Body Dressing/Undressing Lower body dressing      What is the patient wearing?: Pants     Lower body assist Assist for lower body dressing: Total Assistance - Patient < 25%     Toileting Toileting    Toileting assist Assist for toileting: Maximal Assistance - Patient 25 - 49%     Transfers Chair/bed transfer  Transfers assist     Chair/bed transfer assist level: Moderate Assistance - Patient 50 - 74%     Locomotion Ambulation   Ambulation assist      Assist level: Minimal Assistance - Patient > 75% Assistive device: Walker-hemi Max distance: 20   Walk 10 feet activity   Assist  Walk 10 feet activity did not occur: Safety/medical concerns  Assist level: Minimal Assistance - Patient > 75% Assistive device: Walker-hemi, Orthosis   Walk 50 feet activity   Assist Walk 50 feet with 2 turns activity did not occur: Safety/medical concerns         Walk 150 feet activity   Assist Walk  150 feet activity did not occur: Safety/medical concerns         Walk 10 feet on uneven surface  activity   Assist Walk 10 feet on uneven surfaces activity did not occur: Safety/medical concerns         Wheelchair     Assist Will patient use wheelchair at discharge?: Yes Type of Wheelchair: Manual Wheelchair activity did not occur: Safety/medical concerns  Wheelchair assist level: Contact Guard/Touching assist Max wheelchair distance:  150    Wheelchair 50 feet with 2 turns activity    Assist    Wheelchair 50 feet with 2 turns activity did not occur: Safety/medical concerns   Assist Level: Contact Guard/Touching assist   Wheelchair 150 feet activity     Assist  Wheelchair 150 feet activity did not occur: Safety/medical concerns   Assist Level: Contact Guard/Touching assist   Blood pressure (!) 126/96, pulse 67, temperature 97.9 F (36.6 C), temperature source Oral, resp. rate 14, height 5\' 10"  (1.778 m), weight 83.6 kg, SpO2 100 %.  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 Patient has hx ofprior right MCA infarction as well as VP shunt for hydrocephalus  Continue CIR 2. Antithrombotics: -DVT/anticoagulation:SCDs. Venous Dopplers negative -antiplatelet therapy: Aspirin 81 mg daily 3. Pain Management:Tylenol as needed.    Topamax increased to twice daily for headaches, decreased to nightly on 9/24.  Controlled on 9/25 4. Mood:Provide emotional support  Question of suicidal ideation on 9/24, psych consulted, recommended Cymbalta.  Per therapies, pt with inconsistent cognition and memory with possibility of manipulative behavior. Will request Neuropsych consult. -antipsychotic agents: N/A 5. Neuropsych: This patientis notcapable of making decisions on hisown behalf. 6. Skin/Wound Care:Routine skin checks 7. Fluids/Electrolytes/Nutrition:Routine I/Os.    BMP within acceptable range on 9/25 8. Seizure disorder. Vimpat 100 mg twice daily, Depakote 1000 mg twice daily. EEG negative  No seizures since admission to rehab- 9/25 9. Post stroke dysphagia. Follow-up speech therapy. Diet has been advanced to regular as of 10/04/2018. 10. Hypertension.  Patient on metoprolol 25 mg twice daily prior to admission. Resume as needed Vitals:    10/18/18 2100 10/19/18 0336  BP: (!) 130/94 (!) 126/96  Pulse: 72 67  Resp: 18 14  Temp: (!) 97.5 F (36.4 C) 97.9 F (36.6 C)  SpO2: 97% 100%   Resume metoprolol at 12.5mg  bid on 9/21  Diastolic blood pressures remain slightly elevated on 9/25  11. Hyperlipidemia. Lipitor 80 mg daily 12. Neurogenic bladder. generally continent. 13. Chronic thrombocytopenia.   Platelets 113 on 9/21, labs pending  Continue to monitor    LOS: 14 days A FACE TO FACE EVALUATION WAS PERFORMED  Nylan Nakatani 10/21 10/19/2018, 8:57 AM

## 2018-10-19 NOTE — Progress Notes (Signed)
Physical Therapy Session Note  Patient Details  Name: Patrick Franklin MRN: [id number] Date of Birth: 15-Apr-1970  Today's Date: 10/19/2018 PT Individual Time: 0900-1000 PT Individual Time Calculation (min): 60 min   Short Term Goals: Week 3:  PT Short Term Goal 1 (Week 3): STG=LTG due to ELOS  Skilled Therapeutic Interventions/Progress Updates:     Pt received supine in bed and agreeable to PT. Supine>sit transfer with CGA assist and min for    Ambulatory transfer to toilet with HW and min assist. Pt able to void bowel and bladder. Sit<>stand from toilet with min assist and UE on rail or HW. Personal hygiene with CGA and set up Dressing at toilet.   Gait training in rehab gym x 35f with min assist overall and min moderate multimodal cues R weight shift AD management.   Sitting balance EOB while engaged in cognitive task of low dificulty peg board puzzle. Pt required mod-max cues to attend to task, problem solving, and visual scanning to the L. Pt required supervision assist overall for sitting balance with cues to awareness of LOB to the L x 2.  [facility]  And HW with min cues for attention task.   [facility] and all needs met.    At end of treatment, pt was tearful asking if there is a God, given current circumstance and multiple CVAs. PT used therapeutic use of self for emotional support, but then pt quickly became distracted and no longer tearful and in need of emotioal support. .          Therapy Documentation Precautions:  Precautions Precautions: Fall Precaution Comments: Lt hemi + Lt hemianopsia at baseline Restrictions Weight Bearing Restrictions: No Pain: Denies at rest    Therapy/Group: Individual Therapy  ALorie Phenix9/25/2020, 10:02 AM

## 2018-10-19 NOTE — Progress Notes (Signed)
Occupational Therapy Session Note  Patient Details  Name: Patrick Franklin MRN: [id number] Date of Birth: Jul 25, 1970  Today's Date: 10/19/2018 OT Individual Time: 1415-1500 OT Individual Time Calculation (min): 45 min  30 minutes missed   Short Term Goals: Week 2:  OT Short Term Goal 1 (Week 2): STGs=LTGs due to ELOS      Skilled Therapeutic Interventions/Progress Updates:    Pt greeted in bed, talking to himself upon arrival and adjusting the HOB height. He stated that he had eaten fried chicken at the grocery store. When asked if he was at the grocery store, pt able to report that he was at a hospital ("[facility]") in [place] (though he needed cuing twice beforehand that he was not in [place]). Pt stated that he was exhausted and shook his head when OT presented the w/c and BSC. He nodded and initiated transition EOB with presented with his toothbrush. Supervision for supine<sit and Mod A for stand pivot<w/c using hemi walker once his sneakers were donned. While sitting at the sink, he completed oral care and several grooming tasks with Min A-setup assist. 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 He told OT that his father was in the air[employer] so he got to visit [place] several times. Pt then completed stand pivot<bed in the same manner as written above. After OT doffed his shoes, he transitioned to supine with supervision assist. Min A for repositioning in bed for neutral alignment and to protect hemiplegic side. He was left in bed with all needs within reach and bed alarm set. Tx focus placed on functional transfers, OOB tolerance, activity tolerance, and ADL retraining. 30 minutes missed due to pt fatigue and request to return to bed to rest.    Therapy Documentation Precautions:  Precautions Precautions: Fall Precaution Comments: Lt hemi + Lt hemianopsia at  baseline Restrictions Weight Bearing Restrictions: No Vital Signs: Therapy Vitals Temp: (!) 97.5 F (36.4 C) Pulse Rate: 78 Resp: 20 BP: 121/89 Patient Position (if appropriate): Lying Oxygen Therapy SpO2: 97 % O2 Device: Room Air Pain: No s/s pain during tx Pain Assessment Pain Scale: Faces Faces Pain Scale: No hurt ADL: ADL Eating: Not assessed Grooming: Moderate assistance(combing hair) Where Assessed-Grooming: Chair Upper Body Bathing: Maximal assistance Where Assessed-Upper Body Bathing: Edge of bed Lower Body Bathing: Dependent(2 assist) Where Assessed-Lower Body Bathing: Edge of bed Upper Body Dressing: Maximal assistance Where Assessed-Upper Body Dressing: Edge of bed Lower Body Dressing: Dependent(2 assist) Where Assessed-Lower Body Dressing: Edge of bed Toileting: Dependent(using Stedy) Where Assessed-Toileting: Bedside Commode Toilet Transfer: Maximal assistance Toilet Transfer Method: Stand pivot Science writer: Engineer, technical sales Transfer: Not assessed      Therapy/Group: Individual Therapy  Reanne Nellums A Shawndell Schillaci 10/19/2018, 3:19 PM

## 2018-10-19 NOTE — Plan of Care (Signed)
°  Problem: Consults °Goal: RH STROKE PATIENT EDUCATION °Description: See Patient Education module for education specifics  °Outcome: Progressing °  °Problem: RH BLADDER ELIMINATION °Goal: RH STG MANAGE BLADDER WITH ASSISTANCE °Description: STG Manage Bladder With min Assistance °Outcome: Progressing °  °Problem: RH SAFETY °Goal: RH STG ADHERE TO SAFETY PRECAUTIONS W/ASSISTANCE/DEVICE °Description: STG Adhere to Safety Precautions With mod/max Assistance/Device. °Outcome: Progressing °  °Problem: RH COGNITION-NURSING °Goal: RH STG USES MEMORY AIDS/STRATEGIES W/ASSIST TO PROBLEM SOLVE °Description: STG Uses Memory Aids/Strategies With mod Assistance to Problem Solve. °Outcome: Progressing °  °Problem: RH PAIN MANAGEMENT °Goal: RH STG PAIN MANAGED AT OR BELOW [name] PAIN GOAL °Description: < 4 °Outcome: Progressing °  °Problem: RH KNOWLEDGE DEFICIT °Goal: RH STG INCREASE KNOWLEDGE OF HYPERTENSION °Description: Patient/caregiver will verbalize understanding of HTN including diet, exercise, medications, monitoring, and follow up care with mod assist. °Outcome: Progressing °Goal: RH STG INCREASE KNOWLEGDE OF HYPERLIPIDEMIA °Description: Patient/caregiver will verbalize understanding of HLD including diet, exercise, medications, monitoring, and follow up care with mod assist. °Outcome: Progressing °Goal: RH STG INCREASE KNOWLEDGE OF STROKE PROPHYLAXIS °Description: Patient/caregiver will verbalize understanding of stroke prophylaxis including diet, exercise, medications, monitoring, and follow up care with mod assist. °Outcome: Progressing °  °

## 2018-10-19 NOTE — Progress Notes (Signed)
Speech Language Pathology Weekly Progress and Session Note ° °Patient Details  °Name: Patrick Franklin °MRN: [id number] °Date of Birth: 07/03/1970 ° °Beginning of progress report period: October 12, 2018 °End of progress report period: October 19, 2018 ° °Today's Date: 10/19/2018 °SLP Individual Time: 1101-1155 °SLP Individual Time Calculation (min): 54 min ° °Short Term Goals: °Week 2: SLP Short Term Goal 1 (Week 2): STG = LTG due to LOS ° °  °New Short Term Goals: °Week 3: SLP Short Term Goal 1 (Week 3): Patient will attend to basic, familiar task for 5 minutes with Mod verbal cues for redirection. °SLP Short Term Goal 2 (Week 3): Patient will complete basic, familiar tasks with Mod a verbal cues for functional problem solving. °SLP Short Term Goal 3 (Week 3): Pt will recall basic biographical and/or daily information with Mod A verbal/visual cues. ° °Weekly Progress Updates: Pt has made slow and inconsistent progress this reporting period due to fluctuating lethargy as well as some verbal agitation/limited participation. Per chart review, pt's sister reported pt is currently at baseline level of functioning for verbal expression and goals for expressing basic needs have been met - will not longer target in ST. Pt can express basic wants/needs with Supervision A. Pt is currently Mod-Max assist for cognition due to severe problem solving and attention deifits. Pt has demonstrated slightly improved sustained attention and basic problem solving during self care and 2 familiar structured tasks. Pt education is ongoing. Pt would continue to benefit from skilled ST while inpatient in order to maximize functional independence and reduce burden of care prior to discharge. Anticipate that pt will need 24/7 supervision at discharge in addition to ST follow up at next level of care - pt is currently pending SNF placement.  °  ° ° °Intensity: Minumum of 1-2 x/day, 30 to 90 minutes °Frequency: 3 to 5 out of 7  days °Duration/Length of Stay:   °Treatment/Interventions: Cognitive remediation/compensation;Therapeutic Activities;Therapeutic Exercise;Internal/external aids;Patient/family education;Cueing hierarchy;Functional tasks ° ° °Daily Session °Skilled Therapeutic Interventions: Pt was seen for skilled ST targeting cognition. SLP facilitated session with a basic calendar sequencing task, which pt completed with Mod A verbal and visual cues for sequencing numbers 1-30 on September calendar. Max A multimodal cues required to identify missing numbers on calendar. Pt independently recalled directions for a familiar matching task (matching colored blocks to associated color pattern board), however Mod A verbal/visual cues required for error awareness and correction. During a basic familiar coin sorting task, pt became extremely restless, excessively tapping his foot and perseverative on the number 6. When SLP attempted to redirect pt, he became verbally agitated, yelling loudly "you're being mean again" at therapist. SLP deescalated situation by ignoring verbal aggression, providing water, and repositioning him to sitting upright in his chair as opposed to slumped over to left. Pt was left sitting in wheelchair with seatbelt alarm activated, call bell within reach, and all needs met. Continue per current plan of care.  ° ° °  °Pain °Pain Assessment °Pain Scale: Faces °Faces Pain Scale: No hurt ° °Therapy/Group: Individual Therapy ° ° E  °10/19/2018, 12:08 PM ° ° ° ° ° ° ° ° °

## 2018-10-19 NOTE — Progress Notes (Signed)
Pt has been asking this Probation officer about God and heaven this morning. He then expressed wanting to go to heaven. He stated that "sometimes I wish I could jump out of the window." He states that he "wants to go to heaven now". He states that "I don't see the point why I am here" pt states he "Can't do anything for myself, hand don't work, brain don't work. Even a 47 year old can outsmart me." pt expressed disappointment about his sister, and feeling down. Emotional support given. Pt may benefit from chaplain consult for emotional and spiritual support. Continue plan of care. [name], RN

## 2018-10-20 ENCOUNTER — Inpatient Hospital Stay (HOSPITAL_COMMUNITY): Payer: 59 | Admitting: Physical Therapy

## 2018-10-20 NOTE — Plan of Care (Signed)
°  Problem: RH BLADDER ELIMINATION Goal: RH STG MANAGE BLADDER WITH ASSISTANCE Description: STG Manage Bladder With min Assistance Outcome: Not Progressing; incontinence

## 2018-10-20 NOTE — Progress Notes (Signed)
PHYSICAL MEDICINE & REHABILITATION PROGRESS NOTE   Subjective/Complaints: No new issues. Still asleep when I arrived this morning  ROS: Limited due to cognitive/behavioral    Objective:   No results found. No results for input(s): WBC, HGB, HCT, PLT in the last 72 hours. Recent Labs    10/19/18 0617  NA 143  K 3.6  CL 111  CO2 26  GLUCOSE 96  BUN 9  CREATININE 0.96  CALCIUM 8.5*    Intake/Output Summary (Last 24 hours) at 10/20/2018 1005 Last data filed at 10/20/2018 0916 Gross per 24 hour  Intake 580 ml  Output --  Net 580 ml     Physical Exam: Vital Signs Blood pressure 125/86, pulse 75, temperature 98.4 F (36.9 C), temperature source Oral, resp. rate 19, height 5\' 10"  (1.778 m), weight 83.6 kg, SpO2 97 %. Constitutional: No distress . Vital signs reviewed. HEENT: EOMI, oral membranes moist, crani Neck: supple Cardiovascular: RRR without murmur. No JVD    Respiratory: CTA Bilaterally without wheezes or rales. Normal effort    GI: BS +, non-tender, non-distended  Skin: Warm and dry.  Intact. Psych: Limited to due to cognition Musc: No edema in extremities.  No tenderness in extremities. Neurologic: Alert Motor examination limited due to cognition, but moving right side spontaneously  Assessment/Plan: 1. Functional deficits secondary to new posterior left MCA distribution infarct in the setting of prior right MCA infarct with chronic left hemiparesis which require 3+ hours per day of interdisciplinary therapy in a comprehensive inpatient rehab setting.  Physiatrist is providing close team supervision and 24 hour management of active medical problems listed below.  Physiatrist and rehab team continue to assess barriers to discharge/monitor patient progress toward functional and medical goals  Care Tool:  Bathing    Body parts bathed by patient: Face, Right upper leg, Left upper leg, Chest, Front perineal area, Abdomen, Left arm   Body parts  bathed by helper: Right arm, Buttocks, Right lower leg, Left lower leg Body parts n/a: Right lower leg, Left lower leg   Bathing assist Assist Level: Moderate Assistance - Patient 50 - 74%     Upper Body Dressing/Undressing Upper body dressing   What is the patient wearing?: Pull over shirt    Upper body assist Assist Level: Maximal Assistance - Patient 25 - 49%    Lower Body Dressing/Undressing Lower body dressing      What is the patient wearing?: Pants     Lower body assist Assist for lower body dressing: Total Assistance - Patient < 25%     Toileting Toileting    Toileting assist Assist for toileting: Maximal Assistance - Patient 25 - 49%     Transfers Chair/bed transfer  Transfers assist     Chair/bed transfer assist level: Moderate Assistance - Patient 50 - 74%     Locomotion Ambulation   Ambulation assist      Assist level: Minimal Assistance - Patient > 75% Assistive device: Walker-hemi Max distance: 20   Walk 10 feet activity   Assist  Walk 10 feet activity did not occur: Safety/medical concerns  Assist level: Minimal Assistance - Patient > 75% Assistive device: Walker-hemi, Orthosis   Walk 50 feet activity   Assist Walk 50 feet with 2 turns activity did not occur: Safety/medical concerns         Walk 150 feet activity   Assist Walk 150 feet activity did not occur: Safety/medical concerns         Walk 10  feet on uneven surface  activity   Assist Walk 10 feet on uneven surfaces activity did not occur: Safety/medical concerns         Wheelchair     Assist Will patient use wheelchair at discharge?: Yes Type of Wheelchair: Manual Wheelchair activity did not occur: Safety/medical concerns  Wheelchair assist level: Contact Guard/Touching assist Max wheelchair distance: 150    Wheelchair 50 feet with 2 turns activity    Assist    Wheelchair 50 feet with 2 turns activity did not occur: Safety/medical  concerns   Assist Level: Contact Guard/Touching assist   Wheelchair 150 feet activity     Assist  Wheelchair 150 feet activity did not occur: Safety/medical concerns   Assist Level: Contact Guard/Touching assist   Blood pressure 125/86, pulse 75, temperature 98.4 F (36.9 C), temperature source Oral, resp. rate 19, height 5\' 10"  (1.778 m), weight 83.6 kg, SpO2 97 %.  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 Patient has hx ofprior right MCA infarction as well as VP shunt for hydrocephalus  Continue CIR  SNF pending 2. Antithrombotics: -DVT/anticoagulation:SCDs. Venous Dopplers negative -antiplatelet therapy: Aspirin 81 mg daily 3. Pain Management:Tylenol as needed.    Topamax increased to twice daily for headaches, decreased to nightly on 9/24.  Controlled on 9/26 4. Mood:Provide emotional support  Question of suicidal ideation on 9/24, psych consulted, recommended Cymbalta.  Per therapies, pt with inconsistent cognition and memory with possibility of manipulative behavior. requested Neuropsych consult. -antipsychotic agents: N/A 5. Neuropsych: This patientis notcapable of making decisions on hisown behalf. 6. Skin/Wound Care:Routine skin checks 7. Fluids/Electrolytes/Nutrition:Routine I/Os.    BMP within acceptable range on 9/25 8. Seizure disorder. Vimpat 100 mg twice daily, Depakote 1000 mg twice daily. EEG negative  No seizures since admission to rehab- 9/26 9. Post stroke dysphagia. Follow-up speech therapy. Diet has been advanced to regular as of 10/04/2018. 10. Hypertension.  Patient on metoprolol 25 mg twice daily prior to admission. Resume as needed Vitals:   10/19/18 1913 10/20/18 0331  BP: 115/79 125/86  Pulse: 92 75  Resp: 18 19  Temp: 98.4 F (36.9 C)   SpO2: 96% 97%   Resumed  metoprolol at 12.5mg  bid on 9/21  Diastolic blood pressures remain slightly elevated on 9/26--no changes  11. Hyperlipidemia. Lipitor 80 mg daily 12. Neurogenic bladder. generally continent. 13. Chronic thrombocytopenia.   Platelets 113 on 9/21   Continue to monitor    LOS: 15 days A FACE TO FACE EVALUATION WAS PERFORMED  10/21 10/20/2018, 10:05 AM

## 2018-10-20 NOTE — Progress Notes (Signed)
Physical Therapy Session Note  Patient Details  Name: Patrick Franklin MRN: [id number] Date of Birth: [birth year]  Today's Date: 10/20/2018   AM Session  PT Individual Time: 1308-6578 PT Individual Time Calculation (min): 55 min   PM Session  Time: 1645-1730 Time calculation: 45 minutes   Short Term Goals: Week 2:  PT Short Term Goal 1 (Week 2): STG=LTG due to ELOS PT Short Term Goal 1 - Progress (Week 2): Progressing toward goal  Skilled Therapeutic Interventions/Progress Updates:  AM Session: Pt in bed upon arrival for therapy. Pt agreeable to treatment session. Pt transferred to sitting EOB with minA. SPT donned pt's L AFO and shoes. Pt completed sit>stand with hemiwalker, L AFO, and minA to stand pivot to wheelchair. Pt transported to dayroom for session. Pt ambulated 40 feet in dayroom to mat table with hemiwalker, L AFO, and minA. Pt required verbal cueing for task and safety and tactile cueing for weightshifting. Then, pt did sit>stand and stood with CGA while playing game of cornholex2. 1st trial: pt reached for 10 bean bags on R side and threw them at the corn hole board, 2nd trial: pt reached 18 bean bags on L side and threw them at the corn hole board. Overall, pt did well with task, but did become distracted and had to be redirected to task a couple of times. Then, pt ambulated 20 feet with hemiwalker, L AFO, and CGA to NuStep. Pt required assistance for setup of bilateral LE and RUE. Pt completed 8 minutes on the NuStep for neuromuscular facilitation, 296 steps at a workload of 2. Pt became distracted while doing the NuStep and had to be redirected multiple times to complete task. Then, pt walked 5 feet with hemiwalker, L AFO, and minA back to wheelchair. Pt propelled wheelchair back to room with CGA. Pt was expressed need to use the bathroom and became very distracted on route back to room. Pt had to be redirected multiple times back to task. Pt left sitting upright in wheelchair  in his room with tray table in front, call bell within reach, and all needs met at this time. NT notified that pt expressed the need to use the bathroom.   PM Session: Pt in bed upon arrival for therapy. Pt agreeable to treatment session. Pt transferred to sitting EOB with minA. SPT donned pt's L AFO and shoes. Pt completed sit>stand with hemiwalker, L AFO, and minA to stand pivot to wheelchair. Pt transported to therapy gym. Pt completed sit>stand with minA and maintained standing while completing various activities. Pt stood with minA and hemiwalker and placed the red pieces on the checkerboard and the black pieces on the checkerboard. Pt able to complete 7/15 correctly and required verbal cueing and demonstration for task and safety of standing. Pt took seated rest break d/t fatigue. Then pt asked to stand and place the green pegs in peg board and instead pt put all colored pegs onto board. When asked to correct problem, pt left 3/5 green pegs on board. Pt took seated rest break afterwards d/t fatigue. Then, pt stood with minA and hemiwalker and completed low difficulty pattern on peg board with picture as guidance. Pt required max cues for error detection and pt unable to problem solve how to fix errors d/t extreme distractibility. Pt sat down in wheelchair and transported back to room. Pt completed sit>stand with minA, L AFO, and hemiwalker. Pt required multiple verbal cues to redirect and sit down EOB. Pt transferred EOB>supine. Pt left supine  in bed with tray table in front, call bell within reach, and all needs met at this time.      Therapy Documentation Precautions:  Precautions Precautions: Fall Precaution Comments: Lt hemi + Lt hemianopsia at baseline Restrictions Weight Bearing Restrictions: No   Pain: Denies pain   Therapy/Group: Individual Therapy  Olena Leatherwood, SPT  10/20/2018, 11:38 AM

## 2018-10-21 NOTE — Consult Note (Signed)
Responded to consult request, but pt unavailable. Chaplain will check back tomorrow, or please call if there is earlier need.   Rev. Eloise Levels Chaplain

## 2018-10-21 NOTE — Progress Notes (Signed)
[facility] PHYSICAL MEDICINE & REHABILITATION PROGRESS NOTE   Subjective/Complaints:  no new complaints. Limited engagement today  ROS: Limited due to cognitive/behavioral   Objective:   No results found. No results for input(s): WBC, HGB, HCT, PLT in the last 72 hours. Recent Labs    10/19/18 0617  NA 143  K 3.6  CL 111  CO2 26  GLUCOSE 96  BUN 9  CREATININE 0.96  CALCIUM 8.5*    Intake/Output Summary (Last 24 hours) at 10/21/2018 0915 Last data filed at 10/21/2018 0205 Gross per 24 hour  Intake 804 ml  Output 800 ml  Net 4 ml     Physical Exam: Vital Signs Blood pressure 131/82, pulse 72, temperature 97.8 F (36.6 C), resp. rate 18, height 5\' 10"  (1.778 m), weight 83.6 kg, SpO2 97 %. Constitutional: No distress . Vital signs reviewed. HEENT: EOMI, oral membranes moist Neck: supple Cardiovascular: RRR without murmur. No JVD    Respiratory: CTA Bilaterally without wheezes or rales. Normal effort    GI: BS +, non-tender, non-distended  Skin: Warm and dry.  Intact. Psych: Limited to due to cognition Musc: No edema in extremities.  No tenderness in extremities. Neurologic: Alert Motor examination limited due to cognition, but moving right side spontaneously  Assessment/Plan: 1. Functional deficits secondary to new posterior left MCA distribution infarct in the setting of prior right MCA infarct with chronic left hemiparesis which require 3+ hours per day of interdisciplinary therapy in a comprehensive inpatient rehab setting.  Physiatrist is providing close team supervision and 24 hour management of active medical problems listed below.  Physiatrist and rehab team continue to assess barriers to discharge/monitor patient progress toward functional and medical goals  Care Tool:  Bathing    Body parts bathed by patient: Face, Right upper leg, Left upper leg, Chest, Front perineal area, Abdomen, Left arm   Body parts bathed by helper: Right arm, Buttocks, Right  lower leg, Left lower leg Body parts n/a: Right lower leg, Left lower leg   Bathing assist Assist Level: Moderate Assistance - Patient 50 - 74%     Upper Body Dressing/Undressing Upper body dressing   What is the patient wearing?: Pull over shirt    Upper body assist Assist Level: Maximal Assistance - Patient 25 - 49%    Lower Body Dressing/Undressing Lower body dressing      What is the patient wearing?: Pants     Lower body assist Assist for lower body dressing: Total Assistance - Patient < 25%     Toileting Toileting    Toileting assist Assist for toileting: Maximal Assistance - Patient 25 - 49%     Transfers Chair/bed transfer  Transfers assist     Chair/bed transfer assist level: Minimal Assistance - Patient > 75%     Locomotion Ambulation   Ambulation assist      Assist level: Minimal Assistance - Patient > 75% Assistive device: Walker-hemi Max distance: 20   Walk 10 feet activity   Assist  Walk 10 feet activity did not occur: Safety/medical concerns  Assist level: Minimal Assistance - Patient > 75% Assistive device: Orthosis, Cane-quad   Walk 50 feet activity   Assist Walk 50 feet with 2 turns activity did not occur: Safety/medical concerns         Walk 150 feet activity   Assist Walk 150 feet activity did not occur: Safety/medical concerns         Walk 10 feet on uneven surface  activity  Assist Walk 10 feet on uneven surfaces activity did not occur: Safety/medical concerns         Wheelchair     Assist Will patient use wheelchair at discharge?: Yes Type of Wheelchair: Manual Wheelchair activity did not occur: Safety/medical concerns  Wheelchair assist level: Contact Guard/Touching assist Max wheelchair distance: 50    Wheelchair 50 feet with 2 turns activity    Assist    Wheelchair 50 feet with 2 turns activity did not occur: Safety/medical concerns   Assist Level: Contact Guard/Touching assist    Wheelchair 150 feet activity     Assist  Wheelchair 150 feet activity did not occur: Safety/medical concerns   Assist Level: Contact Guard/Touching assist   Blood pressure 131/82, pulse 72, temperature 97.8 F (36.6 C), resp. rate 18, height 5\' 10"  (1.778 m), weight 83.6 kg, SpO2 97 %.  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 Patient has hx ofprior right MCA infarction as well as VP shunt for hydrocephalus  Continue CIR  SNF pending 2. Antithrombotics: -DVT/anticoagulation:SCDs. Venous Dopplers negative -antiplatelet therapy: Aspirin 81 mg daily 3. Pain Management:Tylenol as needed.    Topamax increased to twice daily for headaches, decreased to nightly on 9/24.  Controlled on 9/27 4. Mood:Provide emotional support  Question of suicidal ideation on 9/24, psych consulted, recommended Cymbalta.  Per therapies, pt with inconsistent cognition and memory with possibility of manipulative behavior. requested Neuropsych consult. -antipsychotic agents: N/A 5. Neuropsych: This patientis notcapable of making decisions on hisown behalf. 6. Skin/Wound Care:Routine skin checks 7. Fluids/Electrolytes/Nutrition:Routine I/Os.    BMP within acceptable range on 9/25 8. Seizure disorder. Vimpat 100 mg twice daily, Depakote 1000 mg twice daily. EEG negative  No seizures since admission to rehab- 9/27 9. Post stroke dysphagia. Follow-up speech therapy. Diet has been advanced to regular as of 10/04/2018. 10. Hypertension.  Patient on metoprolol 25 mg twice daily prior to admission. Resume as needed Vitals:   10/20/18 2302 10/21/18 0406  BP: (!) 135/96 131/82  Pulse: 70 72  Resp:  18  Temp:  97.8 F (36.6 C)  SpO2:  97%   Resumed metoprolol at 12.5mg  bid on 9/21  Diastolic blood pressures remain slightly elevated on  9/26--better this am. Monitor for now 11. Hyperlipidemia. Lipitor 80 mg daily 12. Neurogenic bladder. generally continent. 13. Chronic thrombocytopenia.   Platelets 113 on 9/21   Continue to monitor    LOS: 16 days A FACE TO FACE EVALUATION WAS PERFORMED  10/21 10/21/2018, 9:15 AM

## 2018-10-22 ENCOUNTER — Encounter (HOSPITAL_COMMUNITY): Payer: 59 | Admitting: Psychology

## 2018-10-22 ENCOUNTER — Inpatient Hospital Stay (HOSPITAL_COMMUNITY): Payer: 59 | Admitting: Physical Therapy

## 2018-10-22 ENCOUNTER — Inpatient Hospital Stay (HOSPITAL_COMMUNITY): Payer: 59 | Admitting: Speech Pathology

## 2018-10-22 ENCOUNTER — Other Ambulatory Visit: Payer: Self-pay | Admitting: Primary Care

## 2018-10-22 ENCOUNTER — Inpatient Hospital Stay (HOSPITAL_COMMUNITY): Payer: 59 | Admitting: Occupational Therapy

## 2018-10-22 DIAGNOSIS — I69398 Other sequelae of cerebral infarction: Secondary | ICD-10-CM

## 2018-10-22 DIAGNOSIS — E785 Hyperlipidemia, unspecified: Secondary | ICD-10-CM

## 2018-10-22 DIAGNOSIS — F0630 Mood disorder due to known physiological condition, unspecified: Secondary | ICD-10-CM

## 2018-10-22 DIAGNOSIS — I10 Essential (primary) hypertension: Secondary | ICD-10-CM

## 2018-10-22 DIAGNOSIS — R339 Retention of urine, unspecified: Secondary | ICD-10-CM

## 2018-10-22 NOTE — Progress Notes (Signed)
Speech Language Pathology Daily Session Note  Patient Details  Name: Patrick Franklin MRN: [id number] Date of Birth: 1970-10-08  Today's Date: 10/22/2018 SLP Individual Time: 7829-5621 SLP Individual Time Calculation (min): 43 min  Short Term Goals: Week 3: SLP Short Term Goal 1 (Week 3): Patient will attend to basic, familiar task for 5 minutes with Mod verbal cues for redirection. SLP Short Term Goal 2 (Week 3): Patient will complete basic, familiar tasks with Mod a verbal cues for functional problem solving. SLP Short Term Goal 3 (Week 3): Pt will recall basic biographical and/or daily information with Mod A verbal/visual cues.  Skilled Therapeutic Interventions: Pt was seen for skilled ST targeting cognition. Pt demonstrated increased ability to recall daily information with Mod A verbal question cues from clinician - during functional conversation, pt recalled events of physical therapy this morning (dynavision, checkers, walking), as well as neuropsych visit (talking about his stay/headaches, etc.). He occasionally made irrelevant comments, although far fewer than previous encounters. [name] sustained attention continues to fluctuate, as he presents with quick onset of lethargy throughout session. When fully alert, he attended to conversational and structured tasks for ~4-5 minutes with Mod A verbal cues. In a basic functional 2-step card sequencing task, pt was able to verbalize correct sequence of actions (ex: peel a banana before you eat it) with Mod A verbal cues for 2 out of 3 attempts. During familiar color block pattern board matching task, pt matched colors to associated pattern on a display board with Mod A for error awareness, although he could correct errors with only Min A verbal/visual cues. Pt was left in bed with alarm set, all needs within reach. Continue per current plan of care.       Pain Pain Assessment Pain Scale: Faces Faces Pain Scale: Hurts a little bit Pain Type:  Acute pain Pain Location: Head(headache) Pain Orientation: Right Pain Descriptors / Indicators: Aching Pain Onset: On-going Pain Intervention(s): Emotional support(RN premedicated prior to SLP's arrival) Multiple Pain Sites: No  Therapy/Group: Individual Therapy  Little Ishikawa 10/22/2018, 11:54 AM

## 2018-10-22 NOTE — Plan of Care (Signed)
°  Problem: Consults °Goal: RH STROKE PATIENT EDUCATION °Description: See Patient Education module for education specifics  °Outcome: Progressing °  °Problem: RH BLADDER ELIMINATION °Goal: RH STG MANAGE BLADDER WITH ASSISTANCE °Description: STG Manage Bladder With min Assistance °Outcome: Progressing °  °Problem: RH SAFETY °Goal: RH STG ADHERE TO SAFETY PRECAUTIONS W/ASSISTANCE/DEVICE °Description: STG Adhere to Safety Precautions With mod/max Assistance/Device. °Outcome: Progressing °  °Problem: RH COGNITION-NURSING °Goal: RH STG USES MEMORY AIDS/STRATEGIES W/ASSIST TO PROBLEM SOLVE °Description: STG Uses Memory Aids/Strategies With mod Assistance to Problem Solve. °Outcome: Progressing °  °Problem: RH PAIN MANAGEMENT °Goal: RH STG PAIN MANAGED AT OR BELOW [name] PAIN GOAL °Description: < 4 °Outcome: Progressing °  °Problem: RH KNOWLEDGE DEFICIT °Goal: RH STG INCREASE KNOWLEDGE OF HYPERTENSION °Description: Patient/caregiver will verbalize understanding of HTN including diet, exercise, medications, monitoring, and follow up care with mod assist. °Outcome: Progressing °Goal: RH STG INCREASE KNOWLEGDE OF HYPERLIPIDEMIA °Description: Patient/caregiver will verbalize understanding of HLD including diet, exercise, medications, monitoring, and follow up care with mod assist. °Outcome: Progressing °Goal: RH STG INCREASE KNOWLEDGE OF STROKE PROPHYLAXIS °Description: Patient/caregiver will verbalize understanding of stroke prophylaxis including diet, exercise, medications, monitoring, and follow up care with mod assist. °Outcome: Progressing °  °

## 2018-10-22 NOTE — Consult Note (Signed)
Neuropsychological Consultation   Patient:   Patrick Franklin   DOB:   09-11-1970  MR Number:  [id number]  Location:  [facility] [id number] [place] [address] Dept: [place]: [id number]           Date of Service:   10/22/2018  Start Time:   9 AM End Time:   10 AM  Provider/Observer:  Ilean Skill, Psy.D.       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  The patient presented on 10/01/2018 with garbled speech and left-sided gaze.  Cranial CT scan negative for acute changes.  Follow-up cranial CT scan completed showing progressive posterior left MCA infarction.  EEG negative for seizure at the time but there was noted severe diffuse encephalopathy as well as cortical dysfunction in the right hemisphere.  During his time here on the comprehensive inpatient rehabilitation unit, the patient verbalized willingness to throw himself in front of a train and other negative statements about himself and potential suicidal ideation.  There was a consultation with Buford Dresser, MD/psychiatrist due to possible suicidal ideation.  The patient was interviewed via telemedicine and the psychiatrist also spoke with the patient's sister.  Her assessment and interviews showed that the patient has reported thoughts of harming himself but per reports from his sister that this is not uncommon and he has been making statements like this for quite some time.  The patient has been diagnosed with a mood disorder following the development of anger outbursts after his first stroke.  Therefore  this does not appear to be a history of bipolar disorder but more reflection of his brain injury from cerebrovascular events and seizures.   Reason for Service:  The patient was referred for neuropsychological consultation due to coping and adjustment issues and concerns about depression and statements to staff about willingness to self-harm.  Below is the HPI for the current admission.  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 Rickard Patience well as neurogenic bladder, asthma, hypertension. Per chart review patient lives with sister and family. He does have a personal care attendant. He had been attending adult day program in [place] 2 days/week pre-COVID. Per sister he would ambulate approximately 25 feet can ambulate to the bathroom needed some assistance for stand pivot to the chair. Presented 10/01/2018 with garbled speech left side gaze. Cranial CT scan negative for acute changes. CTA head and neck left mid ICA short segment high-grade stenosis. No large vessel occlusion. COVID negative, valproic acid level 83, ammonia 24, platelets 70-82000, chemistries unremarkable. CT perfusion scan showed no core infarct. A follow-up cranial CT scan completed showing progressive posterior left MCA infarction. Patient was not a candidate for any carotid intervention. Lower extremity Dopplers negative. Echocardiogram with ejection fraction of 60% no source of embolism. EEG negative for seizure there was noted severe diffuse encephalopathy as well as  cortical dysfunction in right hemisphere. Neurology consulted currently maintained on low-dose  aspirin. Patient remains on Vimpat as well as valproate. Patient initially had also been loaded with Keppra discontinued after EEG negative. Patient currently remains n.p.o. with follow-up speech therapyand diet has since been upgraded to regular. Therapy evaluations completed and patient was admitted for a comprehensive rehab program  Current Status:  During the clinical interview today with myself the patient made a statement about how his headache was so severe that he felt like throwing himself off the window.  However, more in-depth discussions about this do not indicate that the patient is intending to harm himself or is particular risk for suicidal behaviors or gestures.  He has not shared any activities for harming himself but simply these are statements about his frustration and descriptions of the severity of his pain and difficulties coping with his severe impairments.  The patient did not show any significant mood congruent with desire to die or kill himself.  While the patient did complain about his headache over and over he did not repeat statements about willingness to self-harm more than when he was describing his headache.  When directly questioned about whether he had intent or plan to harm himself he denied any intent or plan.   Behavioral Observation: Patrick Franklin  presents as a 48 y.o.-year-old Right Caucasian Male who appeared his stated age. his dress was Appropriate and he was Well Groomed and his manners were Appropriate, inappropriate to the situation.  his participation was indicative of Inattentive and Redirectable behaviors.  There were any physical disabilities noted.  he displayed an inappropriate level of cooperation and motivation.     Interactions:    Active Inattentive and Redirectable  Attention:   abnormal and attention span appeared shorter than expected for age  Memory:   abnormal; global memory impairment noted  Visuo-spatial:  not examined  Speech  (Volume):  low  Speech:   normal; garbled  Thought Process:  Circumstantial, Tangential and Disorganized  Though Content:  Rumination; not suicidal and not homicidal  Orientation:   person and place  Judgment:   Poor  Planning:   Poor  Affect:    Irritable and Lethargic  Mood:    Dysphoric  Insight:   Shallow  Intelligence:   low  Medical History:   Past Medical History:  Diagnosis Date   Asthma    Cardiomegaly 04/13/2016   Depression    DVT (deep venous thrombosis) (HCC)    GERD (gastroesophageal reflux disease)    Hemiplegia and hemiparesis    Left side   Hypertension    Neurogenic bladder    Neurogenic bowel    Neuropathy    Pulmonary embolism (HCC) 01/13/2016   Seizures (HCC)    Stroke (HCC) 12/31/2015   left side deficits    Subluxation of shoulder joint 04/13/2016   left   UTI (urinary tract infection)      Psychiatric History:  No indication of prior psychiatric history or bipolar disorder.  The diagnosis of mood disorder in his chart is 1 of the mood disorder due to his general medical condition/old stroke/cerebrovascular event.  Family Med/Psych History:  Family History  Problem Relation Age of Onset   Stroke Maternal Uncle    Stroke Maternal Uncle     Risk of Suicide/Violence: low the patient did not describe any imminent intent to harm himself.  His verbalizations to staff members are likely just his way of expressing his frustration with his current  situation that he has no or little control over.  The patient does not appear to be an imminent threat to self-harm.  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  The patient presented on 10/01/2018 with garbled speech and left-sided  gaze.  Cranial CT scan negative for acute changes.  Follow-up cranial CT scan completed showing progressive posterior left MCA infarction.  EEG negative for seizure at the time but there was noted severe diffuse encephalopathy as well as cortical dysfunction in the right hemisphere.  During his time here on the comprehensive inpatient rehabilitation unit, the patient verbalized willingness to throw himself in front of a train and other negative statements about himself and potential suicidal ideation.  There was a consultation with Patrick Beets, MD/psychiatrist due to possible suicidal ideation.  The patient was interviewed via telemedicine and the psychiatrist also spoke with the patient's sister.  Her assessment and interviews showed that the patient has reported thoughts of harming himself but per reports from his sister that this is not uncommon and he has been making statements like this for quite some time.  The patient has been diagnosed with a mood disorder following the development of anger outbursts after his first stroke.  Therefore this does not appear to be a history of bipolar disorder but more reflection of his brain injury from cerebrovascular events and seizures.  During the clinical interview today with myself the patient made a statement about how his headache was so severe that he felt like throwing himself off the window.  However, more in-depth discussions about this do not indicate that the patient is intending to harm himself or is particular risk for suicidal behaviors or gestures.  He has not shared any activities for harming himself but simply these are statements about his frustration and descriptions of the severity of his pain and difficulties coping with his severe impairments.  The patient did not show any significant mood congruent with desire to die or kill himself.  While the patient did complain about his headache over and over he did not repeat statements about  willingness to self-harm more than when he was describing his headache.  When directly questioned about whether he had intent or plan to harm himself he denied any intent or plan.   Disposition/Plan:  The patient appears to be a low risk as far as intent to self-harm.  Most of his verbalizations are directly related to his frustration with his medical status, headaches, and significant loss of function.  The patient denies any intent to actively harm himself and his current physical and intellectual capacity would make acting on such as impulsive very difficult if not impossible.  Diagnosis:    History of Mood disorder due to General medical condition.old stroke     Seizure        Electronically Signed   _______________________ Arley Phenix, Psy.D.

## 2018-10-22 NOTE — Progress Notes (Signed)
Occupational Therapy Session Note  Patient Details  Name: RONDELL PARDON MRN: [id number] Date of Birth: [birth year]  Today's Date: 10/22/2018 OT Individual Time: 1448-1856 OT Individual Time Calculation (min): 39 min    Short Term Goals: Week 2:  OT Short Term Goal 1 (Week 2): STGs=LTGs due to ELOS  Skilled Therapeutic Interventions/Progress Updates:    Upon entering the room, pt starting to eat salad from lunch with RN and NT present in room. Pt required set up A to open containers. Pt eating a few bites and then falling asleep after swallowing. OT unable to fully alert pt this session. Pt needing max cuing for redirection and tangential speech. OT unable to have pt actively participate in therapeutic intervention. Food removed for safety and OT assisted with repositioning pt in bed. Bed alarm activated and call bell within reach.   Therapy Documentation Precautions:  Precautions Precautions: Fall Precaution Comments: Lt hemi + Lt hemianopsia at baseline Restrictions Weight Bearing Restrictions: No General: General OT Amount of Missed Time: 30 Minutes Vital Signs: Therapy Vitals Temp: 97.9 F (36.6 C) Temp Source: Oral Pulse Rate: 68 Resp: 18 BP: (!) 126/91 Patient Position (if appropriate): Lying Oxygen Therapy SpO2: 98 % O2 Device: Room Air Pain: Pain Assessment Pain Scale: Faces Faces Pain Scale: Hurts a little bit Pain Type: Acute pain Pain Location: Head(headache) Pain Orientation: Right Pain Descriptors / Indicators: Aching Pain Onset: On-going Pain Intervention(s): Emotional support(RN premedicated prior to SLP's arrival) Multiple Pain Sites: No ADL: ADL Eating: Not assessed Grooming: Moderate assistance(combing hair) Where Assessed-Grooming: Chair Upper Body Bathing: Maximal assistance Where Assessed-Upper Body Bathing: Edge of bed Lower Body Bathing: Dependent(2 assist) Where Assessed-Lower Body Bathing: Edge of bed Upper Body Dressing: Maximal  assistance Where Assessed-Upper Body Dressing: Edge of bed Lower Body Dressing: Dependent(2 assist) Where Assessed-Lower Body Dressing: Edge of bed Toileting: Dependent(using Stedy) Where Assessed-Toileting: Bedside Commode Toilet Transfer: Maximal assistance Toilet Transfer Method: Stand pivot Science writer: Engineer, technical sales Transfer: Not assessed   Therapy/Group: Individual Therapy  Gypsy Decant 10/22/2018, 2:54 PM

## 2018-10-22 NOTE — Progress Notes (Signed)
Physical Therapy Session Note  Patient Details  Name: BURT PIATEK MRN: [id number] Date of Birth: [birth year]  Today's Date: 10/22/2018 PT Individual Time: 0805-0900 PT Individual Time Calculation (min): 55 min   Short Term Goals: Week 2:  PT Short Term Goal 1 (Week 2): STG=LTG due to ELOS PT Short Term Goal 1 - Progress (Week 2): Progressing toward goal Week 3:  PT Short Term Goal 1 (Week 3): STG=LTG due to ELOS  Skilled Therapeutic Interventions/Progress Updates:  Pt presented in bed eating breakfast with NT present. Pt handed off to PTA for completion of meal. Pt required mod cues for sustained task to complete meal. Pt initially c/o HA however when asked later in session pt denies HA. Pt performed supine to sit at EOB with minA and use of bed features to complete meal. PTA threaded pants and donned AFO/shoes maxA. Pt performed STS minA and pt required modA to complete clothing management. Pt then performed stand pivot transfer to w/c minA with use of HW. Pt then set up for oral hygiene and left with belt alarm on and NT notified.      Therapy Documentation Precautions:  Precautions Precautions: Fall Precaution Comments: Lt hemi + Lt hemianopsia at baseline Restrictions Weight Bearing Restrictions: No General:   Vital Signs:   Pain: Pain Assessment Pain Scale: Faces Faces Pain Scale: Hurts a little bit Pain Type: Acute pain Pain Location: Head(headache) Pain Orientation: Right Pain Descriptors / Indicators: Aching Pain Onset: On-going Pain Intervention(s): Emotional support(RN premedicated prior to SLP's arrival) Multiple Pain Sites: No   Therapy/Group: Individual Therapy  Gianelle Mccaul  Katalyn Matin, PTA  10/22/2018, 12:35 PM

## 2018-10-22 NOTE — Progress Notes (Signed)
[facility] PHYSICAL MEDICINE & REHABILITATION PROGRESS NOTE   Subjective/Complaints: Continues to ask irrelevant questions.  Does not remember seeing me even though multiple contacts about 1 week ago. Appreciate neuropsych note ROS: Limited due to cognitive/behavioral   Objective:   No results found. No results for input(s): WBC, HGB, HCT, PLT in the last 72 hours. No results for input(s): NA, K, CL, CO2, GLUCOSE, BUN, CREATININE, CALCIUM in the last 72 hours.  Intake/Output Summary (Last 24 hours) at 10/22/2018 1105 Last data filed at 10/22/2018 1013 Gross per 24 hour  Intake 582 ml  Output 525 ml  Net 57 ml     Physical Exam: Vital Signs Blood pressure 130/88, pulse 67, temperature 97.6 F (36.4 C), resp. rate 18, height 5\' 10"  (1.778 m), weight 83.6 kg, SpO2 97 %. Constitutional: No distress . Vital signs reviewed. HEENT: EOMI, oral membranes moist Neck: supple Cardiovascular: RRR without murmur. No JVD    Respiratory: CTA Bilaterally without wheezes or rales. Normal effort    GI: BS +, non-tender, non-distended  Skin: Warm and dry.  Intact. Psych: Limited to due to cognition Musc: No edema in extremities.  No tenderness in extremities. Neurologic: Alert Motor examination limited due to cognition, but moving right side spontaneously  Assessment/Plan: 1. Functional deficits secondary to new posterior left MCA distribution infarct in the setting of prior right MCA infarct with chronic left hemiparesis which require 3+ hours per day of interdisciplinary therapy in a comprehensive inpatient rehab setting.  Physiatrist is providing close team supervision and 24 hour management of active medical problems listed below.  Physiatrist and rehab team continue to assess barriers to discharge/monitor patient progress toward functional and medical goals  Care Tool:  Bathing    Body parts bathed by patient: Face, Right upper leg, Left upper leg, Chest, Front perineal area,  Abdomen, Left arm   Body parts bathed by helper: Right arm, Buttocks, Right lower leg, Left lower leg Body parts n/a: Right lower leg, Left lower leg   Bathing assist Assist Level: Moderate Assistance - Patient 50 - 74%     Upper Body Dressing/Undressing Upper body dressing   What is the patient wearing?: Pull over shirt    Upper body assist Assist Level: Maximal Assistance - Patient 25 - 49%    Lower Body Dressing/Undressing Lower body dressing      What is the patient wearing?: Pants     Lower body assist Assist for lower body dressing: Total Assistance - Patient < 25%     Toileting Toileting    Toileting assist Assist for toileting: Maximal Assistance - Patient 25 - 49%     Transfers Chair/bed transfer  Transfers assist     Chair/bed transfer assist level: Minimal Assistance - Patient > 75%     Locomotion Ambulation   Ambulation assist      Assist level: Minimal Assistance - Patient > 75% Assistive device: Walker-hemi Max distance: 20   Walk 10 feet activity   Assist  Walk 10 feet activity did not occur: Safety/medical concerns  Assist level: Minimal Assistance - Patient > 75% Assistive device: Orthosis, Cane-quad   Walk 50 feet activity   Assist Walk 50 feet with 2 turns activity did not occur: Safety/medical concerns         Walk 150 feet activity   Assist Walk 150 feet activity did not occur: Safety/medical concerns         Walk 10 feet on uneven surface  activity   Assist Walk  10 feet on uneven surfaces activity did not occur: Safety/medical concerns         Wheelchair     Assist Will patient use wheelchair at discharge?: Yes Type of Wheelchair: Manual Wheelchair activity did not occur: Safety/medical concerns  Wheelchair assist level: Contact Guard/Touching assist Max wheelchair distance: 50    Wheelchair 50 feet with 2 turns activity    Assist    Wheelchair 50 feet with 2 turns activity did not occur:  Safety/medical concerns   Assist Level: Contact Guard/Touching assist   Wheelchair 150 feet activity     Assist  Wheelchair 150 feet activity did not occur: Safety/medical concerns   Assist Level: Contact Guard/Touching assist   Blood pressure 130/88, pulse 67, temperature 97.6 F (36.4 C), resp. rate 18, height 5\' 10"  (1.778 m), weight 83.6 kg, SpO2 97 %.  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 Patient has hx ofprior right MCA infarction as well as VP shunt for hydrocephalus  Continue CIR PT OT speech  SNF pending 2. Antithrombotics: -DVT/anticoagulation:SCDs. Venous Dopplers negative -antiplatelet therapy: Aspirin 81 mg daily 3. Pain Management:Tylenol as needed.    Topamax increased to twice daily for headaches, decreased to nightly on 9/24.  Controlled on 9/27 4. Mood:Provide emotional support  Question of suicidal ideation on 9/24, psych consulted, recommended Cymbalta.  Per therapies, pt with inconsistent cognition and memory with possibility of manipulative behavior. requested Neuropsych consult. -antipsychotic agents: N/A 5. Neuropsych: This patientis notcapable of making decisions on hisown behalf. 6. Skin/Wound Care:Routine skin checks 7. Fluids/Electrolytes/Nutrition:Routine I/Os.    BMP within acceptable range on 9/25 8. Seizure disorder. Vimpat 100 mg twice daily, Depakote 1000 mg twice daily. EEG negative  No seizures since admission to rehab- 9/27, has very long latency of response with poor attention.  Do not feel that these represent seizures 9. Post stroke dysphagia. Follow-up speech therapy. Diet has been advanced to regular as of 10/04/2018. 10. Hypertension.  Patient on metoprolol 25 mg twice daily prior to admission. Resume as needed Vitals:   10/21/18 1920 10/22/18 0356  BP: (!)  123/92 130/88  Pulse: 92 67  Resp: 18 18  Temp: 98.1 F (36.7 C) 97.6 F (36.4 C)  SpO2: 96% 97%   Resumed metoprolol at 12.5mg  bid on 9/21  Diastolic blood pressures remain slightly elevated on 9/26--better this am. Monitor for now 11. Hyperlipidemia. Lipitor 80 mg daily 12. Neurogenic bladder. generally continent. 13. Chronic thrombocytopenia.   Platelets 113 on 9/21   Continue to monitor    LOS: 17 days A FACE TO FACE EVALUATION WAS PERFORMED  10/21 10/22/2018, 11:05 AM

## 2018-10-23 ENCOUNTER — Inpatient Hospital Stay (HOSPITAL_COMMUNITY): Payer: Medicare Other | Admitting: Occupational Therapy

## 2018-10-23 ENCOUNTER — Inpatient Hospital Stay (HOSPITAL_COMMUNITY): Payer: Medicare Other | Admitting: Speech Pathology

## 2018-10-23 ENCOUNTER — Inpatient Hospital Stay (HOSPITAL_COMMUNITY): Payer: Medicare Other | Admitting: Physical Therapy

## 2018-10-23 NOTE — Progress Notes (Signed)
Last night during medication administration, patient stopped. His right hand was shaking & his eyes went from side to side. When asked if he was alright, he answered yes immediately & focused. He then stated, "my hand is shaking, it was like that before. Sometimes I can't stop it." He finished his medications, and was talking as he usually does. He has delayed responses. On coming nurse was informed & it was mentioned to the physician assistant. No orders were given at this time, to be discussed with the physician. His nurse was informed.

## 2018-10-23 NOTE — Progress Notes (Signed)
Speech Language Pathology Daily Session Note  Patient Details  Name: Patrick Franklin MRN: [id number] Date of Birth: 11/29/1970  Today's Date: 10/23/2018 SLP Individual Time: 1100-1157 SLP Individual Time Calculation (min): 57 min  Short Term Goals: Week 3: SLP Short Term Goal 1 (Week 3): Patient will attend to basic, familiar task for 5 minutes with Mod verbal cues for redirection. SLP Short Term Goal 2 (Week 3): Patient will complete basic, familiar tasks with Mod a verbal cues for functional problem solving. SLP Short Term Goal 3 (Week 3): Pt will recall basic biographical and/or daily information with Mod A verbal/visual cues.  Skilled Therapeutic Interventions: Pt was seen for skilled ST targeting cognition. SLP facilitated session with basic money sorting and counting task. Pt sorted coins by type (pennies, nickels, dimes, quarters) with Min A for error awareness and correction. Mod-Max A multimodal cues required to count and determine total for pennies and nickels present; pt became silent and disengaged when SLP prompted him to continue by counting dimes and quarters. Pt independently requested a cup of coffee to increase alertness and demonstrated basic problem solving evidenced in suggestion to add ice to coffee to cool it down, using a lid and straw to prevent spills, etc. SLP also facilitated session with novel card sorting task (sorting 6 different shapes), which pt completed with ~90% accuracy and Min A verbal cues for error awareness. Pt continues to present with fluctuating lethargy/participation throughout sessions, and I question that pt sometimes closes eyes as a behavioral tactic to encourage SLP to leave when he becomes bored with activities. His sustained attention is improved since admission, however still inconsistent; he will sustain attention for ~5 min when fully engaged in preferred activities and/or conversations. He continues to make off the wall comments and requiring  frequent and blunt redirection. Pt was left in bed with alarm set and all needs within reach. Continue per current plan of care.        Pain Pain Assessment Pain Scale: Faces Faces Pain Scale: No hurt  Therapy/Group: Individual Therapy  Patrick Franklin 10/23/2018, 11:56 AM

## 2018-10-23 NOTE — Plan of Care (Signed)
°  Problem: Consults °Goal: RH STROKE PATIENT EDUCATION °Description: See Patient Education module for education specifics  °Outcome: Progressing °  °Problem: RH BLADDER ELIMINATION °Goal: RH STG MANAGE BLADDER WITH ASSISTANCE °Description: STG Manage Bladder With min Assistance °Outcome: Progressing °  °Problem: RH SAFETY °Goal: RH STG ADHERE TO SAFETY PRECAUTIONS W/ASSISTANCE/DEVICE °Description: STG Adhere to Safety Precautions With mod/max Assistance/Device. °Outcome: Progressing °  °Problem: RH COGNITION-NURSING °Goal: RH STG USES MEMORY AIDS/STRATEGIES W/ASSIST TO PROBLEM SOLVE °Description: STG Uses Memory Aids/Strategies With mod Assistance to Problem Solve. °Outcome: Progressing °  °Problem: RH PAIN MANAGEMENT °Goal: RH STG PAIN MANAGED AT OR BELOW [name] PAIN GOAL °Description: < 4 °Outcome: Progressing °  °Problem: RH KNOWLEDGE DEFICIT °Goal: RH STG INCREASE KNOWLEDGE OF HYPERTENSION °Description: Patient/caregiver will verbalize understanding of HTN including diet, exercise, medications, monitoring, and follow up care with mod assist. °Outcome: Progressing °Goal: RH STG INCREASE KNOWLEGDE OF HYPERLIPIDEMIA °Description: Patient/caregiver will verbalize understanding of HLD including diet, exercise, medications, monitoring, and follow up care with mod assist. °Outcome: Progressing °Goal: RH STG INCREASE KNOWLEDGE OF STROKE PROPHYLAXIS °Description: Patient/caregiver will verbalize understanding of stroke prophylaxis including diet, exercise, medications, monitoring, and follow up care with mod assist. °Outcome: Progressing °  °

## 2018-10-23 NOTE — Progress Notes (Signed)
Occupational Therapy Weekly Progress Note  Patient Details  Name: Patrick Franklin MRN: [id number] Date of Birth: 1970/11/22  Beginning of progress report period: October 14, 2018 End of progress report period: October 23, 2018  Today's Date: 10/23/2018 OT Individual Time: 1400-1445 OT Individual Time Calculation (min): 45 min  and Today's Date: 10/23/2018 OT Missed Time: 15 Minutes Missed Time Reason: Patient unwilling/refused to participate without medical reason   Patient has met 4 of 7 long term goals.  Short term goals not set due to estimated length of stay. Pt making limited progress this week towards occupational therapy goals. Pt having increase in inappropriate behaviors during sessions. Pt continues to be internally and externally distracted and requires mod- max cuing throughout session to attend to task. Pt continues to have bouts of lethargy but this may also be behaviors. Functional transfers have been min - max A and tend to fluctuate. Ub self care with mod -  Max A and LB self care with max A.   Patient continues to demonstrate the following deficits: muscle weakness, decreased cardiorespiratoy endurance, decreased coordination and decreased motor planning, decreased attention to left, decreased initiation, decreased attention, decreased awareness, decreased problem solving, decreased safety awareness, decreased memory and delayed processing and decreased sitting balance, decreased standing balance, decreased postural control, hemiplegia and decreased balance strategies and therefore will continue to benefit from skilled OT intervention to enhance overall performance with Reduce care partner burden.  See Patient's Care Plan for progression toward long term goals.  Patient progressing toward long term goals..  Continue plan of care.  Skilled Therapeutic Interventions/Progress Updates:    Upon entering the room, pt is awake and then attempting to close eyes as if he is  asleep. Pt required max cuing to attend to tasks this session. Pt performed grooming tasks of shaving with set up A but needing over 20 minutes to complete task secondary to attention. Supine >sit with mod A to EOB. Pt able to perform static sitting with hands in lap and close supervision for 10 minutes. Pt with very tangential speech and topics that are not being discussed. Pt verbalizing, " Why did God let this happen to me. I just want to die." Pt continuing this line of conversation and OT unable to redirect. Sit >supine with mod A and repositioned in bed. RN and PA notified of pt's behaviors and comments. Bed alarm activated.   Therapy Documentation Precautions:  Precautions Precautions: Fall Precaution Comments: Lt hemi + Lt hemianopsia at baseline Restrictions Weight Bearing Restrictions: No   Pain: Pain Assessment Pain Scale: Faces Faces Pain Scale: No hurt ADL: ADL Eating: Not assessed Grooming: Moderate assistance(combing hair) Where Assessed-Grooming: Chair Upper Body Bathing: Maximal assistance Where Assessed-Upper Body Bathing: Edge of bed Lower Body Bathing: Dependent(2 assist) Where Assessed-Lower Body Bathing: Edge of bed Upper Body Dressing: Maximal assistance Where Assessed-Upper Body Dressing: Edge of bed Lower Body Dressing: Dependent(2 assist) Where Assessed-Lower Body Dressing: Edge of bed Toileting: Dependent(using Stedy) Where Assessed-Toileting: Bedside Commode Toilet Transfer: Maximal assistance Toilet Transfer Method: Stand pivot Acupuncturist: Animator Transfer: Not assessed    Therapy/Group: Individual Therapy  Alen Bleacher 10/23/2018, 12:58 PM

## 2018-10-23 NOTE — Progress Notes (Signed)
[facility] PHYSICAL MEDICINE & REHABILITATION PROGRESS NOTE   Subjective/Complaints: Poor attention, increased latency of response ROS: Limited due to cognitive/behavioral   Objective:   No results found. No results for input(s): WBC, HGB, HCT, PLT in the last 72 hours. No results for input(s): NA, K, CL, CO2, GLUCOSE, BUN, CREATININE, CALCIUM in the last 72 hours.  Intake/Output Summary (Last 24 hours) at 10/23/2018 0834 Last data filed at 10/22/2018 1821 Gross per 24 hour  Intake 180 ml  Output 425 ml  Net -245 ml     Physical Exam: Vital Signs Blood pressure (!) 128/94, pulse 68, temperature 97.8 F (36.6 C), resp. rate 17, height 5\' 10"  (1.778 m), weight 83.6 kg, SpO2 97 %. Constitutional: No distress . Vital signs reviewed. HEENT: EOMI, oral membranes moist Neck: supple Cardiovascular: RRR without murmur. No JVD    Respiratory: CTA Bilaterally without wheezes or rales. Normal effort    GI: BS +, non-tender, non-distended  Skin: Warm and dry.  Intact. Psych: Limited to due to cognition Musc: No edema in extremities.  No tenderness in extremities. 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 Functional deficits secondary to new posterior left MCA distribution infarct in the setting of prior right MCA infarct with chronic left hemiparesis which require 3+ hours per day of interdisciplinary therapy in a comprehensive inpatient rehab setting.  Physiatrist is providing close team supervision and 24 hour management of active medical problems listed below.  Physiatrist and rehab team continue to assess barriers to discharge/monitor patient progress toward functional and medical goals  Care  Tool:  Bathing    Body parts bathed by patient: Face, Right upper leg, Left upper leg, Chest, Front perineal area, Abdomen, Left arm   Body parts bathed by helper: Right arm, Buttocks, Right lower leg, Left lower leg Body parts n/a: Right lower leg, Left lower leg   Bathing assist Assist Level: Moderate Assistance - Patient 50 - 74%     Upper Body Dressing/Undressing Upper body dressing   What is the patient wearing?: Pull over shirt    Upper body assist Assist Level: Maximal Assistance - Patient 25 - 49%    Lower Body Dressing/Undressing Lower body dressing      What is the patient wearing?: Pants     Lower body assist Assist for lower body dressing: Total Assistance - Patient < 25%     Toileting Toileting    Toileting assist Assist for toileting: Maximal Assistance - Patient 25 - 49%     Transfers Chair/bed transfer  Transfers assist     Chair/bed transfer assist level: Minimal Assistance - Patient > 75%     Locomotion Ambulation   Ambulation assist      Assist level: Minimal Assistance - Patient > 75% Assistive device: Walker-hemi Max distance: 20   Walk 10 feet activity   Assist  Walk 10 feet activity did not occur: Safety/medical concerns  Assist level: Minimal Assistance - Patient > 75% Assistive device: Orthosis, Cane-quad   Walk 50 feet activity   Assist Walk 50 feet with 2 turns activity did not occur: Safety/medical concerns         Walk 150 feet activity   Assist Walk 150 feet activity did not occur: Safety/medical concerns  Walk 10 feet on uneven surface  activity   Assist Walk 10 feet on uneven surfaces activity did not occur: Safety/medical concerns         Wheelchair     Assist Will patient use wheelchair at discharge?: Yes Type of Wheelchair: Manual Wheelchair activity did not occur: Safety/medical concerns  Wheelchair assist level: Contact Guard/Touching assist Max wheelchair distance: 50     Wheelchair 50 feet with 2 turns activity    Assist    Wheelchair 50 feet with 2 turns activity did not occur: Safety/medical concerns   Assist Level: Contact Guard/Touching assist   Wheelchair 150 feet activity     Assist  Wheelchair 150 feet activity did not occur: Safety/medical concerns   Assist Level: Contact Guard/Touching assist   Blood pressure (!) 128/94, pulse 68, temperature 97.8 F (36.6 C), resp. rate 17, height 5\' 10"  (1.778 m), weight 83.6 kg, SpO2 97 %.  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 Patient has hx ofprior right MCA infarction as well as VP shunt for hydrocephalus  Continue CIR PT OT speech  SNF pending 2. Antithrombotics: -DVT/anticoagulation:SCDs. Venous Dopplers negative -antiplatelet therapy: Aspirin 81 mg daily 3. Pain Management:Tylenol as needed.    Topamax increased to twice daily for headaches, decreased to nightly on 9/24.  Controlled on 9/29 4. Mood:Provide emotional support  Question of suicidal ideation on 9/24, psych consulted, recommended Cymbalta.  Per therapies, pt with inconsistent cognition and memory with possibility of manipulative behavior. requested Neuropsych consult. -antipsychotic agents: N/A 5. Neuropsych: This patientis notcapable of making decisions on hisown behalf. 6. Skin/Wound Care:Routine skin checks 7. Fluids/Electrolytes/Nutrition:Routine I/Os.    BMP within acceptable range on 9/25 8. Seizure disorder. Vimpat 100 mg twice daily, Depakote 1000 mg twice daily. EEG negative  No seizures since admission to rehab- 9/27, has very long latency of response with poor attention.  Do not feel that these represent seizures 9. Post stroke dysphagia. Follow-up speech therapy. Diet has been advanced to regular as of 10/04/2018. 10. Hypertension.   Patient on metoprolol 25 mg twice daily prior to admission. Resume as needed Vitals:   10/22/18 1924 10/23/18 0603  BP: 123/84 (!) 128/94  Pulse: 96 68  Resp: 18 17  Temp: 98.1 F (36.7 C) 97.8 F (36.6 C)  SpO2: 94% 97%   Resumed metoprolol at 12.5mg  bid on 9/21  Diastolic blood pressures remain slightly elevated variable 11. Hyperlipidemia. Lipitor 80 mg daily 12. Neurogenic bladder. generally continent. 13. Chronic thrombocytopenia.   Platelets 113 on 9/21   Continue to monitor 14.  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  Would be a good candidate for Botox.  If samples are available may be able to do prior to SNF otherwise will need to follow-up as outpatient  LOS: 18 days A FACE TO FACE EVALUATION WAS PERFORMED  Erick Colace 10/23/2018, 8:34 AM

## 2018-10-23 NOTE — Progress Notes (Signed)
Physical Therapy Session Note  Patient Details  Name: Patrick Franklin MRN: [id number] Date of Birth: 07-13-1970  Today's Date: 10/23/2018 PT Individual Time: 0900-1010 PT Individual Time Calculation (min): 70 min   Short Term Goals: Week 3:  PT Short Term Goal 1 (Week 3): STG=LTG due to ELOS  Skilled Therapeutic Interventions/Progress Updates: Pt presented in bed sleeping but easily aroused and agreeable to therapy. Pt performed supine to sit with CGA and use of bed features. PTA threaded pants and donned AFO/shoes. Pt performed STS minA and required steadying assist as pt was able to pull up pants. Pt then performed stand pivot to w/c minA with HW. Pt transported to rehab gym and initiated simple task of sorting colored bugs. Pt then indicated need for bathroom. Pt transported back to room and performed stand pivot with wall rail w/c to toilet minA. Pt required mod cues for reaching back to [facility] but was able to maintain fair balance while performing clothing management with minA (+void/BM). Pt returned to w/c in same manner. After hand hygiene pt performed w/c mobility to day room with supervision and x 1 occurrence of hitting doorframe but pt able to correct without cues. Participated in gait training x 55f with minA and HW with x 2 turns. Pt then impulsively began to sat up to perform another ambulation trial. Pt ambulated additional 278fhowever required modA for sequencing turn then unable to completely coordinate turn requiring pt to sit in chair for safety. Pt transported back to room and indicated need for another BM. Performed stand pivot with wall rail in same manner as prior (-BM). Pt set up for nsg to return to bed however did not arrive prior to pt completing task. Returned to bed via [place], but required mod/max verbal cues for standing with Stedy and maintaining standing to clear seat from buttocks. Once seated in bed pt returned to supine supervision with bed features and pt  able to follow commands to pull [facility]. Pt left in bed with alarm on, call bell within reach and current needs met.      Therapy Documentation Precautions:  Precautions Precautions: Fall Precaution Comments: Lt hemi + Lt hemianopsia at baseline Restrictions Weight Bearing Restrictions: No General:   Vital Signs:     Therapy/Group: Individual Therapy  Roylene Heaton  Ruslan Mccabe, PTA  10/23/2018, 4:27 PM

## 2018-10-24 ENCOUNTER — Inpatient Hospital Stay (HOSPITAL_COMMUNITY): Payer: Medicare Other | Admitting: Speech Pathology

## 2018-10-24 ENCOUNTER — Inpatient Hospital Stay (HOSPITAL_COMMUNITY): Payer: Medicare Other | Admitting: Physical Therapy

## 2018-10-24 ENCOUNTER — Inpatient Hospital Stay (HOSPITAL_COMMUNITY): Payer: Medicare Other | Admitting: Occupational Therapy

## 2018-10-24 MED ORDER — DULOXETINE HCL 60 MG PO CPEP
60.0000 mg | ORAL_CAPSULE | Freq: Every day | ORAL | Status: DC
  Administered 2018-10-25 – 2018-11-05 (×12): 60 mg via ORAL
  Filled 2018-10-24 (×12): qty 1

## 2018-10-24 NOTE — Progress Notes (Signed)
Occupational Therapy Session Note  Patient Details  Name: EDU ON MRN: [id number] Date of Birth: 08/23/1970  Today's Date: 10/24/2018 OT Individual Time: 1448-1856 OT Individual Time Calculation (min): 62 min    Short Term Goals: Week 3:  OT Short Term Goal 1 (Week 3): STGs=LTGs secondary to upcoming discharge  Skilled Therapeutic Interventions/Progress Updates:    Upon entering the room, pt supine in bed and RN present. Pt reporting need to have BM. Pt performed supine >sit with mod A to EOB. Pt needing total A to don B shoes and AFO. Pt transferred from bed >wheelchair with mod A and OT assisted pt into bathroom via wheelchair. Pt transferring onto commode chair with mod A. Pt needing assistance with clothing management as well. Pt able to void but not able to have BM this session after increased time. Pt needing assistance with hygiene for thoroughness. Pt seated in wheelchair at sink and needing cuing to attend to task and redirect as pt washing hands 4 times at sink. Pt propelled wheelchair with hemiplegic technique 100' with max cuing and supervision. Pt taking seated rest break and returning to room in same way. Pt returning to bed as well with mod A and OT removed shoes and AFO. Sit >supine with min A. Bed alarm activated and call bell within reach.   Therapy Documentation Precautions:  Precautions Precautions: Fall Precaution Comments: Lt hemi + Lt hemianopsia at baseline Restrictions Weight Bearing Restrictions: No ADL: ADL Eating: Not assessed Grooming: Moderate assistance(combing hair) Where Assessed-Grooming: Chair Upper Body Bathing: Maximal assistance Where Assessed-Upper Body Bathing: Edge of bed Lower Body Bathing: Dependent(2 assist) Where Assessed-Lower Body Bathing: Edge of bed Upper Body Dressing: Maximal assistance Where Assessed-Upper Body Dressing: Edge of bed Lower Body Dressing: Dependent(2 assist) Where Assessed-Lower Body Dressing: Edge of  bed Toileting: Dependent(using Stedy) Where Assessed-Toileting: Bedside Commode Toilet Transfer: Maximal assistance Toilet Transfer Method: Stand pivot Science writer: Engineer, technical sales Transfer: Not assessed   Therapy/Group: Individual Therapy  Gypsy Decant 10/24/2018, 4:30 PM

## 2018-10-24 NOTE — Progress Notes (Signed)
Speech Language Pathology Daily Session Note  Patient Details  Name: Patrick Franklin MRN: [id number] Date of Birth: Jan 18, 1971  Today's Date: 10/24/2018 SLP Individual Time: 1100-1155 SLP Individual Time Calculation (min): 55 min  Short Term Goals: Week 3: SLP Short Term Goal 1 (Week 3): Patient will attend to basic, familiar task for 5 minutes with Mod verbal cues for redirection. SLP Short Term Goal 2 (Week 3): Patient will complete basic, familiar tasks with Mod a verbal cues for functional problem solving. SLP Short Term Goal 3 (Week 3): Pt will recall basic biographical and/or daily information with Mod A verbal/visual cues.  Skilled Therapeutic Interventions: Pt was seen for skilled ST targeting cognition. When prompted with Min A question cues, pt recalled general events of physical therapy session this morning. He used external aid in room to recall this SLP's name with Mod A. Throughout session he spontaneosly demonstrated recall of biographical information about himself and family members with Min A verbal cues or less. Pt sequenced 2 sets of 3-step action cards with Min A verbal/visual cues, however then began to require Total A during same task. As a result, SLP adjusted task difficulty by only presenting 2 picture cards, which pt sequenced through verbal explanation when provided Mod A verbal cues from clinician. SLP also facilitated session with familiar basic card tasks; Pt sorted 52 cards first by color and then by shape (4 shapes) with 100% accuracy given 2 Min A question cue for emergent awareness. [name] sister arrived mid session and asked to speak with social worker - SLP directed her to front desk. She did not have questions for SLP, but I shared overview of progress and current deficits with her. Pt was left in bed with alarm set, all needs within reach, and sister present. Continue per current plan of care.      Pain Pain Assessment Pain Score: 0-No pain  Therapy/Group:  Individual Therapy  Arbutus Leas 10/24/2018, 12:09 PM

## 2018-10-24 NOTE — Progress Notes (Signed)
Physical Therapy Session Note  Patient Details  Name: Patrick Franklin MRN: [id number] Date of Birth: Jul 08, 1970  Today's Date: 10/24/2018 PT Individual Time: 0900-1005 PT Individual Time Calculation (min): 65 min   Short Term Goals: Week 3:  PT Short Term Goal 1 (Week 3): STG=LTG due to ELOS  Skilled Therapeutic Interventions/Progress Updates: Pt presented in bed having completed breakfast with nsg present preparing meds. Pt performed supine to sit with minA increased time and cues for sequencing. Pt able to sit supervision while nsg administered meds. PTA donned AFO/shoes total A and threaded pants maxA at pt was able to lift BLE to assist in threading. Performed STS with HW and pulled pants over hips with minA. Performed stand pivot transfer to w/c minA with HW and pt propelled to day room supervision. Participated in 2 bouts standing corn hole with minA  and pt able to complete task without disruption. Pt ambulated 68ft x2 with seated rest minA for straight line modA for turns. Pt propelled w/c back to room supervision level and pt indicating need for BM. Performed stand pivot w/c to toilet with use of wall rail with minA for clothing management. Pt handed off to NT at toilet to complete toileting.      Therapy Documentation Precautions:  Precautions Precautions: Fall Precaution Comments: Lt hemi + Lt hemianopsia at baseline Restrictions Weight Bearing Restrictions: No General:   Vital Signs:    Therapy/Group: Individual Therapy  Fabiana Dromgoole  Lorenz Donley, PTA  10/24/2018, 12:47 PM

## 2018-10-24 NOTE — Progress Notes (Addendum)
[facility] PHYSICAL MEDICINE & REHABILITATION PROGRESS NOTE   Subjective/Complaints: No issues overnite  ROS: Limited due to cognitive/behavioral   Objective:   No results found. No results for input(s): WBC, HGB, HCT, PLT in the last 72 hours. No results for input(s): NA, K, CL, CO2, GLUCOSE, BUN, CREATININE, CALCIUM in the last 72 hours.  Intake/Output Summary (Last 24 hours) at 10/24/2018 0843 Last data filed at 10/24/2018 0544 Gross per 24 hour  Intake 360 ml  Output 700 ml  Net -340 ml     Physical Exam: Vital Signs Blood pressure (!) 141/91, pulse 61, temperature 98.7 F (37.1 C), resp. rate 18, height _0  (1.778 m), weight 83.6 kg, SpO2 97 %. Constitutional: No distress . Vital signs reviewed. HEENT: EOMI, oral membranes moist Neck: supple Cardiovascular: RRR without murmur. No JVD    Respiratory: CTA Bilaterally without wheezes or rales. Normal effort    GI: BS +, non-tender, non-distended  Skin: Warm and dry.  Intact. Psych: Limited to due to cognition Musc: No edema in extremities.  No tenderness in extremities. 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 Functional deficits secondary to new posterior left MCA distribution infarct in the setting of prior right MCA infarct with chronic left hemiparesis which require 3+ hours per day of interdisciplinary therapy in a comprehensive inpatient rehab setting.  Physiatrist is providing close team supervision and 24 hour management of active medical problems listed below.  Physiatrist and rehab team continue to assess barriers to discharge/monitor patient progress toward functional and medical goals  Care Tool:  Bathing    Body parts bathed by  patient: Face, Right upper leg, Left upper leg, Chest, Front perineal area, Abdomen, Left arm   Body parts bathed by helper: Right arm, Buttocks, Right lower leg, Left lower leg Body parts n/a: Right lower leg, Left lower leg   Bathing assist Assist Level: Moderate Assistance - Patient 50 - 74%     Upper Body Dressing/Undressing Upper body dressing   What is the patient wearing?: Pull over shirt    Upper body assist Assist Level: Maximal Assistance - Patient 25 - 49%    Lower Body Dressing/Undressing Lower body dressing      What is the patient wearing?: Pants     Lower body assist Assist for lower body dressing: Total Assistance - Patient < 25%     Toileting Toileting    Toileting assist Assist for toileting: Maximal Assistance - Patient 25 - 49%     Transfers Chair/bed transfer  Transfers assist     Chair/bed transfer assist level: Minimal Assistance - Patient > 75%(with HW)     Locomotion Ambulation   Ambulation assist      Assist level: Minimal Assistance - Patient > 75% Assistive device: Walker-hemi Max distance: 3f   Walk 10 feet activity   Assist  Walk 10 feet activity did not occur: Safety/medical concerns  Assist level: Minimal Assistance - Patient > 75% Assistive device: Walker-hemi, Orthosis   Walk 50 feet activity   Assist Walk 50 feet with 2 turns activity did not occur: Safety/medical concerns         Walk 150 feet activity   Assist Walk 150 feet activity did not occur: Safety/medical concerns  Walk 10 feet on uneven surface  activity   Assist Walk 10 feet on uneven surfaces activity did not occur: Safety/medical concerns         Wheelchair     Assist Will patient use wheelchair at discharge?: Yes Type of Wheelchair: Manual Wheelchair activity did not occur: Safety/medical concerns  Wheelchair assist level: Supervision/Verbal cueing Max wheelchair distance: 16f    Wheelchair 50 feet with 2  turns activity    Assist    Wheelchair 50 feet with 2 turns activity did not occur: Safety/medical concerns   Assist Level: Supervision/Verbal cueing   Wheelchair 150 feet activity     Assist  Wheelchair 150 feet activity did not occur: Safety/medical concerns   Assist Level: Contact Guard/Touching assist   Blood pressure (!) 141/91, pulse 61, temperature 98.7 F (37.1 C), resp. rate 18, height _0  (1.778 m), weight 83.6 kg, SpO2 97 %.  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 Patient has hx ofprior right MCA infarction as well as VP shunt for hydrocephalus  Continue CIR PT OT speech Team conference today please see physician documentation under team conference tab, met with team face-to-face to discuss problems,progress, and goals. Formulized individual treatment plan based on medical history, underlying problem and comorbidities.  SNF pending 2. Antithrombotics: -DVT/anticoagulation:SCDs. Venous Dopplers negative -antiplatelet therapy: Aspirin 81 mg daily 3. Pain Management:Tylenol as needed.    Topamax increased to twice daily for headaches, decreased to nightly on 9/24.  4. Mood:Provide emotional support  Question of suicidal ideation on 9/24, psych consulted, recommended Cymbalta.  Per therapies, pt with inconsistent cognition and memory with possibility of manipulative behavior. requested Neuropsych consult. -antipsychotic agents: N/A 5. Neuropsych: This patientis notcapable of making decisions on hisown behalf. 6. Skin/Wound Care:Routine skin checks 7. Fluids/Electrolytes/Nutrition:Routine I/Os.    BMP within acceptable range on 9/25 8. Seizure disorder. Vimpat 100 mg twice daily, Depakote 1000 mg twice daily. EEG negative  No seizures since admission to rehab- 9/27, has very long latency of  response with poor attention.  Do not feel that these represent seizures 9. Post stroke dysphagia. Follow-up speech therapy. Diet has been advanced to regular as of 10/04/2018. 10. Hypertension.  Patient on metoprolol 25 mg twice daily prior to admission. Resume as needed Vitals:   10/23/18 2028 10/24/18 0546  BP: (!) 142/97 (!) 141/91  Pulse: 66 61  Resp:  18  Temp: 97.6 F (36.4 C) 98.7 F (37.1 C)  SpO2: 100% 97%   Resumed metoprolol at 12.522mbid on 9/1/30Diastolic blood pressures remain slightly elevated variable 11. Hyperlipidemia. Lipitor 80 mg daily 12. Neurogenic bladder. generally continent. 13. Chronic thrombocytopenia.   Platelets 113 on 9/21   Continue to monitor 14.  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  Would be a good candidate for Botox.  If samples are available may be able to do prior to SNF otherwise will need to follow-up as outpatient  LOS: 19 days A FACE TO [facility] Stephanee Barcomb 10/24/2018, 8:43 AM

## 2018-10-25 ENCOUNTER — Inpatient Hospital Stay (HOSPITAL_COMMUNITY): Payer: Medicare Other | Admitting: Speech Pathology

## 2018-10-25 ENCOUNTER — Inpatient Hospital Stay (HOSPITAL_COMMUNITY): Payer: Medicare Other | Admitting: Physical Therapy

## 2018-10-25 ENCOUNTER — Inpatient Hospital Stay (HOSPITAL_COMMUNITY): Payer: Medicare Other | Admitting: Occupational Therapy

## 2018-10-25 NOTE — Patient Care Conference (Signed)
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    Blood pressure increase diastolic    Seizures (HCC)    Vascular headache    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   Cerebral embolism with cerebral infarction 10/03/2018   Neurological deficit present 10/01/2018   Thrombocytopenia ([place]) 10/01/2018   Neurogenic bladder 10/01/2018   Medicare annual wellness visit, subsequent 07/23/2018   Spastic hemiplegia affecting nondominant side ([place]) 03/26/2018   Impaired decision making 08/30/2017   Aorto-iliac atherosclerosis ([place]) 08/29/2017   History of renal stone 08/22/2017   Seizure disorder ([place]) 08/09/2017   Hemiparesis affecting left side as late effect of stroke ([place]) 08/09/2017   Essential hypertension 05/15/2017   Urinary retention 05/15/2017   Hyperlipidemia 05/15/2017   Hx of ischemic right MCA stroke 05/15/2017   MDD (major depressive disorder) 05/15/2017    Expected Discharge Date: Expected Discharge Date: (SNF)  Team Members Present: Physician leading conference: Dr. Alysia Penna Social Worker Present: Lennart Pall, LCSW Nurse Present: Dorthula Nettles, RN PT Present: Barrie Folk, PT;Rosita Dechalus, PTA OT Present: Darleen Crocker, OT SLP Present: Stormy Fabian, SLP PPS Coordinator present : Gunnar Fusi, SLP     Current Status/Progress Goal Weekly Team Focus  Bowel/Bladder   Continenet with episodes of  incontinence, LBM 10/23/18  less episodes of incontinence  timed toileting   Swallow/Nutrition/ Hydration             ADL's   mod A UB self care, max A LB self care, mod A functional transfers behaviors limiting therapeutic interventions  Mod A overall  NMR, functional transfers, ADL retraining, cognition, balance   Mobility   CGA bed mobility with features, minA STS, minA stand pivot, minA gait up to 74f with HW  min A transfers, mod A gait  gait, d/c planning   Communication   Supervision A expressing wants/needs - goal met  Supervision A      Safety/Cognition/ Behavioral Observations  Min-Max depending on participation, suspect pt is close to baseline level of functioning  Mod-Max A basic tasks  sustained attention, basic functional problem solving, recall with aid   Pain   c/o headaches at least once a day, has tylenol prn, topamax scheduled  pain scale <2/10  assess & treat as needed   Skin   has some eccchymosis to the right leg, no skin break down noted  no new areas of skin break down while on IP rehab  assess q shift      *See Care Plan and progress notes for long and short-term goals.     Barriers to Discharge  Current Status/Progress Possible Resolutions Date Resolved   Nursing  Incontinence               PT  OT                  SLP                SW                Discharge Planning/Teaching Needs:  Plan has changed to NHP sister feels needs more rehab and he is too much care for her and family at home. NH bed search underway.  NA   Team Discussion:  New CVA with h/o prior CVA and with severe impairments.  Cont b/b.  Min - max A with ADLs dependent on patient's effort.  Mod assist goals.  Min-mod with bed mobility and tfs.  MD to change to 15/7 tx schedule.  SW still working on SNF  Revisions to Treatment Plan:  NA    Medical Summary Current Status: out of net bed, cognition remains severely affected by bilateral CVAs Weekly Focus/Goal:  Improve safetly , and tone Left side  Barriers to Discharge: Medical stability   Possible Resolutions to Barriers: see above, SNF placement   Continued Need for Acute Rehabilitation Level of Care: The patient requires daily medical management by a physician with specialized training in physical medicine and rehabilitation for the following reasons: Direction of a multidisciplinary physical rehabilitation program to maximize functional independence : Yes Medical management of patient stability for increased activity during participation in an intensive rehabilitation regime.: Yes Analysis of laboratory values and/or radiology reports with any subsequent need for medication adjustment and/or medical intervention. : Yes   I attest that I was present, lead the team conference, and concur with the assessment and plan of the team.   Natha Guin 10/25/2018, 3:57 PM   Team conference was held via web/ teleconference due to [place] - 19

## 2018-10-25 NOTE — Progress Notes (Signed)
Chaplain responded to consult for behavioral health support. Patrick Franklin was happy to have someone to talk to who was not interested in arguing with him about religion.  Chaplain offered Patrick Franklin a Christian NT and he acepted. Patrick Franklin was overwhelmed with a puzzle book that the chaplain offered. Chaplain left the puzzle book and told Patrick Franklin not to worry with that puzzle right now. Patrick Franklin said he has 5 children that he argues with. He also said a few different times that his sister argues with him about religion and God answering prayer. Patrick Franklin does not believe God answer's prayer. Patrick Franklin also does not know right now if he is a [place] or if he wants to be. Chaplain told Patrick Franklin that his feelings and beliefs are fine and no one at Patrick Franklin is going to force him to believe a particular way. Chaplain assured Patrick Franklin that we are here to support him and willing to have conversations with him as often as he would like. Chaplain observed that Patrick Franklin responds better so slow deliberate and short questions or statements. Too much information too fast is overwhelming for his brain and causes Patrick Franklin to go silent and stare off. A calm and reassuring voice is useful to Patrick Franklin. Patrick Franklin can engage in 5 to 10 minute conversations before becoming too tired to continue. Patrick Franklin thanked the Patrick Franklin for coming by, and asked if the chaplain would come a gain another day. Chaplain remains available per request.  Chaplain Resident, Patrick Franklin, M Div Pager # [phone] personal Pager # [phone] on-call

## 2018-10-25 NOTE — Progress Notes (Signed)
Social Work Patient ID: Patrick Franklin, male   DOB: 05-17-1970, 48 y.o.   MRN: [id number]  Met with pt's sister yesterday to review team conference.  She remains very supportive and is saddened that they had to make decision to plan for SNF.  Aware that search is underway but no offers as yet.  Will keep family, pt and team updated.  Williette Loewe, LCSW

## 2018-10-25 NOTE — Progress Notes (Signed)
[facility] PHYSICAL MEDICINE & REHABILITATION PROGRESS NOTE   Subjective/Complaints: No issues overnite  ROS: Limited due to cognitive/behavioral   Objective:   No results found. No results for input(s): WBC, HGB, HCT, PLT in the last 72 hours. No results for input(s): NA, K, CL, CO2, GLUCOSE, BUN, CREATININE, CALCIUM in the last 72 hours.  Intake/Output Summary (Last 24 hours) at 10/25/2018 0809 Last data filed at 10/24/2018 2140 Gross per 24 hour  Intake 0 ml  Output 700 ml  Net -700 ml     Physical Exam: Vital Signs Blood pressure (!) 137/91, pulse 66, temperature 98.6 F (37 C), resp. rate 18, height 5\' 10"  (1.778 m), weight 83.6 kg, SpO2 97 %. Constitutional: No distress . Vital signs reviewed. HEENT: EOMI, oral membranes moist Neck: supple Cardiovascular: RRR without murmur. No JVD    Respiratory: CTA Bilaterally without wheezes or rales. Normal effort    GI: BS +, non-tender, non-distended  Skin: Warm and dry.  Intact. Psych: Limited to due to cognition Musc: No edema in extremities.  No tenderness in extremities. 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 Functional deficits secondary to new posterior left MCA distribution infarct in the setting of prior right MCA infarct with chronic left hemiparesis which require 3+ hours per day of interdisciplinary therapy in a comprehensive inpatient rehab setting.  Physiatrist is providing close team supervision and 24 hour management of active medical problems listed below.  Physiatrist and rehab team continue to assess barriers to discharge/monitor patient progress toward functional and medical goals  Care Tool:  Bathing    Body parts bathed by  patient: Face, Right upper leg, Left upper leg, Chest, Front perineal area, Abdomen, Left arm   Body parts bathed by helper: Right arm, Buttocks, Right lower leg, Left lower leg Body parts n/a: Right lower leg, Left lower leg   Bathing assist Assist Level: Moderate Assistance - Patient 50 - 74%     Upper Body Dressing/Undressing Upper body dressing   What is the patient wearing?: Pull over shirt    Upper body assist Assist Level: Maximal Assistance - Patient 25 - 49%    Lower Body Dressing/Undressing Lower body dressing      What is the patient wearing?: Pants     Lower body assist Assist for lower body dressing: Total Assistance - Patient < 25%     Toileting Toileting    Toileting assist Assist for toileting: Moderate Assistance - Patient 50 - 74%     Transfers Chair/bed transfer  Transfers assist     Chair/bed transfer assist level: Minimal Assistance - Patient > 75%     Locomotion Ambulation   Ambulation assist      Assist level: Minimal Assistance - Patient > 75% Assistive device: Walker-hemi Max distance: 3ft   Walk 10 feet activity   Assist  Walk 10 feet activity did not occur: Safety/medical concerns  Assist level: Minimal Assistance - Patient > 75% Assistive device: Walker-hemi, Orthosis   Walk 50 feet activity   Assist Walk 50 feet with 2 turns activity did not occur: Safety/medical concerns         Walk 150 feet activity   Assist Walk 150 feet activity did not occur: Safety/medical concerns  Walk 10 feet on uneven surface  activity   Assist Walk 10 feet on uneven surfaces activity did not occur: Safety/medical concerns         Wheelchair     Assist Will patient use wheelchair at discharge?: Yes Type of Wheelchair: Manual Wheelchair activity did not occur: Safety/medical concerns  Wheelchair assist level: Supervision/Verbal cueing Max wheelchair distance: 150ft    Wheelchair 50 feet with 2 turns  activity    Assist    Wheelchair 50 feet with 2 turns activity did not occur: Safety/medical concerns   Assist Level: Supervision/Verbal cueing   Wheelchair 150 feet activity     Assist  Wheelchair 150 feet activity did not occur: Safety/medical concerns   Assist Level: Supervision/Verbal cueing   Blood pressure (!) 137/91, pulse 66, temperature 98.6 F (37 C), resp. rate 18, height 5\' 10"  (1.778 m), weight 83.6 kg, SpO2 97 %.  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 Patient has hx ofprior right MCA infarction as well as VP shunt for hydrocephalus  Continue CIR PT OT speech Discussed botox may need to do as OP   SNF pending 2. Antithrombotics: -DVT/anticoagulation:SCDs. Venous Dopplers negative -antiplatelet therapy: Aspirin 81 mg daily 3. Pain Management:Tylenol as needed.    Topamax increased to twice daily for headaches, decreased to nightly on 9/24.  4. Mood:Provide emotional support  Question of suicidal ideation on 9/24, psych consulted, recommended Cymbalta.  Per therapies, pt with inconsistent cognition and memory with possibility of manipulative behavior. requested Neuropsych consult. -antipsychotic agents: N/A 5. Neuropsych: This patientis notcapable of making decisions on hisown behalf. 6. Skin/Wound Care:Routine skin checks 7. Fluids/Electrolytes/Nutrition:Routine I/Os.    BMP within acceptable range on 9/25 8. Seizure disorder. Vimpat 100 mg twice daily, Depakote 1000 mg twice daily. EEG negative  No seizures since admission to rehab- 9/27, has very long latency of response with poor attention.  Do not feel that these represent seizures 9. Post stroke dysphagia. Follow-up speech therapy. Diet has been advanced to regular as of 10/04/2018. 10. Hypertension.  Patient on metoprolol 25 mg  twice daily prior to admission. Resume as needed Vitals:   10/24/18 1935 10/25/18 0322  BP: (!) 132/93 (!) 137/91  Pulse: 67 66  Resp: 18 18  Temp: 98.7 F (37.1 C) 98.6 F (37 C)  SpO2: 97% 97%   Resumed metoprolol at 12.5mg  bid on 9/21  Diastolic blood pressures remain slightly elevated variable, generally controlled  11. Hyperlipidemia. Lipitor 80 mg daily 12. Neurogenic bladder. generally continent. 13. Chronic thrombocytopenia.   Platelets 113 on 9/21   Continue to monitor 14.  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  Would be a good candidate for Botox.  If samples are available may be able to do prior to SNF otherwise will need to follow-up as outpatient  LOS: 20 days A FACE TO FACE EVALUATION WAS PERFORMED  10/21 10/25/2018, 8:09 AM

## 2018-10-25 NOTE — Progress Notes (Signed)
Physical Therapy Weekly Progress Note  Patient Details  Name: Patrick Franklin MRN: [id number] Date of Birth: 1970-04-02  Beginning of progress report period: October 12, 2018 End of progress report period: October 25, 2018  Today's Date: 10/25/2018 PT Individual Time: 0800-0915 PT Individual Time Calculation (min): 75 min   Pt continues to make progress towards LTGs due to estimated length of stay. Pt is pending SNF acceptance at this time.   Patient continues to demonstrate the following deficits muscle weakness, muscle joint tightness and muscle paralysis, decreased cardiorespiratoy endurance, impaired timing and sequencing, abnormal tone, unbalanced muscle activation, ataxia, decreased coordination and decreased motor planning, decreased visual acuity, decreased visual perceptual skills, decreased visual motor skills, field cut and hemianopsia, decreased attention to left, left side neglect and decreased motor planning, decreased initiation, decreased attention, decreased awareness, decreased problem solving, decreased safety awareness, decreased memory and delayed processing and decreased sitting balance, decreased standing balance, decreased postural control, hemiplegia and decreased balance strategies and therefore will continue to benefit from skilled PT intervention to increase functional independence with mobility.  Patient progressing toward long term goals. Continue plan of care.  PT Short Term Goals Week 2:  PT Short Term Goal 1 (Week 2): STG=LTG due to ELOS PT Short Term Goal 1 - Progress (Week 2): Progressing toward goal Week 3:  PT Short Term Goal 1 (Week 3): STG=LTG due to ELOS PT Short Term Goal 1 - Progress (Week 3): Progressing toward goal   PT Short Term Goal 1 (Week 4): STG=LTG due to ELOS  Skilled Therapeutic Interventions/Progress Updates:  SPT initiated PT treatment intervention during today's session. Pt received supine in bed requesting to urinate upon arrival  for therapy. Pt agreeable to treatment. Pt sat up on the EOB and attempted to void sitting EOB. SPT donned pt's shoes, L AFO, and new brief and pants. Pt completed sit to stand with minA, hemiwalker, and L AFO and was able to help pull up brief and pants. Pt was transported to the hallway in his wheelchair. Pt ambulated 55 feetx2 in the hallway with minA, hemiwalker, and L AFO. Pt was noticeably distracted during the 2nd trial of gait and required verbal cueing for coordination and safety. Pt completed car transfer with modA, hemiwalker, and L AFO. Pt required verbal and tactile cueing for coordination of task and safety. Pt self propelled wheelchair to the dayroom about 120 feet and transferred minA to the mat table. Pt completed rolling activity from supine to R side on mat table 2x8 to facilitate practice and muscle activation. Pt transported back to his room in wheelchair. Pt left sitting upright with call bell within reach and safety belt in place. Pt set up with breakfast and all needs met at this time. NT notified that he was finished with therapy and ready to have breakfast.     Therapy Documentation Precautions:  Precautions Precautions: Fall Precaution Comments: Lt hemi + Lt hemianopsia at baseline Restrictions Weight Bearing Restrictions: No Pain: Denies pain  Pain Assessment Pain Scale: Faces Faces Pain Scale: No hurtVision/Perception  Perception Perception: Impaired Inattention/Neglect: Does not attend to left visual field;Does not attend to left side of body Praxis Praxis: Impaired Praxis Impairment Details: Motor planning  Mobility: Bed Mobility Bed Mobility: Rolling Right;Rolling Left;Supine to Sit;Sit to Supine Rolling Right: Moderate Assistance - Patient 50-74% Rolling Left: Minimal Assistance - Patient > 75% Supine to Sit: Minimal Assistance - Patient > 75% Sit to Supine: Minimal Assistance - Patient > 75% Transfers Transfers: Sit  to Stand;Stand to Sit;Stand Pivot  Transfers Sit to Stand: Minimal Assistance - Patient > 75% Stand to Sit: Minimal Assistance - Patient > 75% Stand Pivot Transfers: Minimal Assistance - Patient > 75%;Moderate Assistance - Patient 50 - 74% Stand Pivot Transfer Details: Tactile cues for weight shifting;Verbal cues for technique;Verbal cues for gait pattern;Verbal cues for precautions/safety;Verbal cues for safe use of DME/AE;Manual facilitation for weight shifting Stand Pivot Transfer Details (indicate cue type and reason): min-modA with hemiwalker on R side for safety and coordination Transfer (Assistive device): Hemi-walker Locomotion : Gait Ambulation: Yes Gait Assistance: Minimal Assistance - Patient > 75%;Moderate Assistance - Patient 50-74% Gait Distance (Feet): 55 Feet Assistive device: Hemi-walker Gait Assistance Details: Tactile cues for weight shifting;Verbal cues for technique;Manual facilitation for weight shifting;Verbal cues for gait pattern;Verbal cues for safe use of DME/AE;Verbal cues for precautions/safety;Tactile cues for posture Gait Assistance Details: min-modA with hemiwalker on R side for safety and coordination Stairs / Additional Locomotion Stairs: No Wheelchair Mobility Wheelchair Mobility: Yes Wheelchair Assistance: Doctor, general practice: Right upper extremity;Right lower extremity Wheelchair Parts Management: Needs assistance Distance: 120  Trunk/Postural Assessment : Cervical Assessment Cervical Assessment: Within Functional Limits Thoracic Assessment Thoracic Assessment: Within Functional Limits Lumbar Assessment Lumbar Assessment: Within Functional Limits Postural Control Postural Control: Deficits on evaluation Trunk Control: decreased control of trunk when sitting, standing, ambulating  Balance: Balance Balance Assessed: Yes Static Sitting Balance Static Sitting - Balance Support: Feet supported Static Sitting - Level of Assistance: 5: Stand by  assistance Static Standing Balance Static Standing - Balance Support: During functional activity Static Standing - Level of Assistance: 3: Mod assist;4: Min assist Dynamic Standing Balance Dynamic Standing - Balance Support: During functional activity Dynamic Standing - Level of Assistance: 3: Mod assist    Therapy/Group: Individual Therapy  Bernadette Hoit, SPT  10/25/2018, 1:02 PM

## 2018-10-25 NOTE — Progress Notes (Signed)
Occupational Therapy Session Note  Patient Details  Name: Patrick Franklin MRN: [id number] Date of Birth: 04-05-1970  Today's Date: 10/25/2018 OT Individual Time: 1412-1450 OT Individual Time Calculation (min): 38 min  40 missed minutes  Short Term Goals: Week 3:  OT Short Term Goal 1 (Week 3): STGs=LTGs secondary to upcoming discharge  Skilled Therapeutic Interventions/Progress Updates:    Upon entering the room, pt supine in bed and sleeping soundly but easily awoken to participate in therapeutic interventions. OT providing total A to pt in order to don B shoes and L AFO. Supine >sit with min A to EOB. Pt with very inappropriate behavior and trying to rub therapists arm and legs and speaking in appropriately as well. OT stopping interaction and asking pt to engage in these actions. OT positioned wheelchair and educated pt on plans for session. OT asking pt to transfer into wheelchair and pt began expressing behaviors by stating, " I don't understand your question". OT waiting for 30 minutes while giving cues as needed for pt to transfer into wheelchair. The set up for transfer is the same each session. When therapist attempts to assist pt with transfer he reports too much assistance being given. He even calls on call bell to RN station and asks for someone to assist him with laying back down in bed. Pt continues to display behaviors so therapist moves equipment and cues pt to return to bed. He begins pushing shoes off feet and transfers sit >supine with supervision. Bed alarm activated and call bell within reach upon exiting the room. 40 missed secondary to behaviors and pt refusal to participate.   Therapy Documentation Precautions:  Precautions Precautions: Fall Precaution Comments: Lt hemi + Lt hemianopsia at baseline Restrictions Weight Bearing Restrictions: No General: General OT Amount of Missed Time: 40 Minutes Vital Signs: Therapy Vitals Temp: (!) 97.1 F (36.2 C) Pulse  Rate: 70 Resp: 18 BP: (!) 114/98 Patient Position (if appropriate): Lying Oxygen Therapy SpO2: 100 % O2 Device: Room Air Pain: Pain Assessment Pain Scale: Faces Faces Pain Scale: No hurt ADL: ADL Eating: Not assessed Grooming: Moderate assistance(combing hair) Where Assessed-Grooming: Chair Upper Body Bathing: Maximal assistance Where Assessed-Upper Body Bathing: Edge of bed Lower Body Bathing: Dependent(2 assist) Where Assessed-Lower Body Bathing: Edge of bed Upper Body Dressing: Maximal assistance Where Assessed-Upper Body Dressing: Edge of bed Lower Body Dressing: Dependent(2 assist) Where Assessed-Lower Body Dressing: Edge of bed Toileting: Dependent(using Stedy) Where Assessed-Toileting: Bedside Commode Toilet Transfer: Maximal assistance Toilet Transfer Method: Stand pivot Science writer: Radiographer, therapeutic: Not assessed Vision   Perception    Praxis Praxis: Impaired Praxis Impairment Details: Motor planning Exercises:   Other Treatments:     Therapy/Group: Individual Therapy  Gypsy Decant 10/25/2018, 3:08 PM

## 2018-10-25 NOTE — Progress Notes (Signed)
Speech Language Pathology Daily Session Note  Patient Details  Name: Patrick Franklin MRN: [id number] Date of Birth: 1970-11-24  Today's Date: 10/25/2018 SLP Individual Time: 1100-1145 SLP Individual Time Calculation (min): 45 min  Short Term Goals: Week 3: SLP Short Term Goal 1 (Week 3): Patient will attend to basic, familiar task for 5 minutes with Mod verbal cues for redirection. SLP Short Term Goal 2 (Week 3): Patient will complete basic, familiar tasks with Mod a verbal cues for functional problem solving. SLP Short Term Goal 3 (Week 3): Pt will recall basic biographical and/or daily information with Mod A verbal/visual cues.  Skilled Therapeutic Interventions: Pt was seen for skilled ST targeting cognition. Pt was found asleep at the sink with water running, call bell across the room on the floor. He stated he was "trying to brush his teeth" but when SLP offered assistance to complete task, he declined. SLP assisted pt in repositioning to sitting upright in wheelchair and returned call bell to be within his reach. Pt sorted picture card tiles by color with Min A verbal cues for identification and error awareness. He then sorted picture card tiles by various categories (transportation, people, animals, etc.) with fluctuating Mod-Max A verbal cues including example models from clinician. Pt sustained attention to tasks for ~2-3 minutes prior to requiring Mod A multimodal cues for redirection. Pt presented with increasing lethargy throughout session, which ultimately resulted in 15 minutes of skilled ST. Pt was left sitting in wheelchair with seatbelt alarm engaged and call bell in lap. Continue per current plan of care.      Pain Pain Assessment Pain Scale: Faces Faces Pain Scale: No hurt  Therapy/Group: Individual Therapy  Arbutus Leas 10/25/2018, 11:45 AM

## 2018-10-26 ENCOUNTER — Inpatient Hospital Stay (HOSPITAL_COMMUNITY): Payer: Medicare Other | Admitting: Occupational Therapy

## 2018-10-26 ENCOUNTER — Inpatient Hospital Stay (HOSPITAL_COMMUNITY): Payer: Medicare Other | Admitting: Speech Pathology

## 2018-10-26 ENCOUNTER — Inpatient Hospital Stay (HOSPITAL_COMMUNITY): Payer: Medicare Other | Admitting: Physical Therapy

## 2018-10-26 LAB — GLUCOSE, CAPILLARY: Glucose-Capillary: 76 mg/dL (ref 70–99)

## 2018-10-26 MED ORDER — METOPROLOL TARTRATE 25 MG PO TABS
25.0000 mg | ORAL_TABLET | Freq: Two times a day (BID) | ORAL | Status: DC
  Administered 2018-10-26 – 2018-11-05 (×20): 25 mg via ORAL
  Filled 2018-10-26 (×21): qty 1

## 2018-10-26 NOTE — Progress Notes (Signed)
[facility] PHYSICAL MEDICINE & REHABILITATION PROGRESS NOTE   Subjective/Complaints: No issues overnite  ROS: Limited due to cognitive/behavioral   Objective:   No results found. No results for input(s): WBC, HGB, HCT, PLT in the last 72 hours. No results for input(s): NA, K, CL, CO2, GLUCOSE, BUN, CREATININE, CALCIUM in the last 72 hours.  Intake/Output Summary (Last 24 hours) at 10/26/2018 0740 Last data filed at 10/25/2018 1834 Gross per 24 hour  Intake 120 ml  Output 150 ml  Net -30 ml     Physical Exam: Vital Signs Blood pressure (!) 141/100, pulse 80, temperature 98.1 F (36.7 C), resp. rate 18, height 5\' 10"  (1.778 m), weight 83.6 kg, SpO2 98 %. Constitutional: No distress . Vital signs reviewed. HEENT: EOMI, oral membranes moist Neck: supple Cardiovascular: RRR without murmur. No JVD    Respiratory: CTA Bilaterally without wheezes or rales. Normal effort    GI: BS +, non-tender, non-distended  Skin: Warm and dry.  Intact. Psych: Limited to due to cognition Musc: No edema in extremities.  No tenderness in extremities. 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 Functional deficits secondary to new posterior left MCA distribution infarct in the setting of prior right MCA infarct with chronic left hemiparesis which require 3+ hours per day of interdisciplinary therapy in a comprehensive inpatient rehab setting.  Physiatrist is providing close team supervision and 24 hour management of active medical problems listed below.  Physiatrist and rehab team continue to assess barriers to discharge/monitor patient progress toward functional and medical goals  Care Tool:  Bathing    Body parts bathed by  patient: Face, Right upper leg, Left upper leg, Chest, Front perineal area, Abdomen, Left arm   Body parts bathed by helper: Right arm, Buttocks, Right lower leg, Left lower leg Body parts n/a: Right lower leg, Left lower leg   Bathing assist Assist Level: Moderate Assistance - Patient 50 - 74%     Upper Body Dressing/Undressing Upper body dressing   What is the patient wearing?: Pull over shirt    Upper body assist Assist Level: Maximal Assistance - Patient 25 - 49%    Lower Body Dressing/Undressing Lower body dressing      What is the patient wearing?: Pants     Lower body assist Assist for lower body dressing: Total Assistance - Patient < 25%     Toileting Toileting    Toileting assist Assist for toileting: Moderate Assistance - Patient 50 - 74%     Transfers Chair/bed transfer  Transfers assist     Chair/bed transfer assist level: Minimal Assistance - Patient > 75%     Locomotion Ambulation   Ambulation assist      Assist level: Minimal Assistance - Patient > 75% Assistive device: Walker-hemi Max distance: 105ft   Walk 10 feet activity   Assist  Walk 10 feet activity did not occur: Safety/medical concerns  Assist level: Minimal Assistance - Patient > 75% Assistive device: Walker-hemi, Orthosis   Walk 50 feet activity   Assist Walk 50 feet with 2 turns activity did not occur: Safety/medical concerns  Assist level: Minimal Assistance - Patient > 75% Assistive device: Walker-hemi, Orthosis    Walk 150 feet activity   Assist Walk 150 feet activity did not occur:  Safety/medical concerns         Walk 10 feet on uneven surface  activity   Assist Walk 10 feet on uneven surfaces activity did not occur: Safety/medical concerns         Wheelchair     Assist Will patient use wheelchair at discharge?: Yes Type of Wheelchair: Manual Wheelchair activity did not occur: Safety/medical concerns  Wheelchair assist level:  Supervision/Verbal cueing Max wheelchair distance: 126ft    Wheelchair 50 feet with 2 turns activity    Assist    Wheelchair 50 feet with 2 turns activity did not occur: Safety/medical concerns   Assist Level: Supervision/Verbal cueing   Wheelchair 150 feet activity     Assist  Wheelchair 150 feet activity did not occur: Safety/medical concerns   Assist Level: Supervision/Verbal cueing   Blood pressure (!) 141/100, pulse 80, temperature 98.1 F (36.7 C), resp. rate 18, height 5\' 10"  (1.778 m), weight 83.6 kg, SpO2 98 %.  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 Patient has hx ofprior right MCA infarction as well as VP shunt for hydrocephalus  Continue CIR PT OT speech Would benefit from botox may need to do as OP   SNF pending 2. Antithrombotics: -DVT/anticoagulation:SCDs. Venous Dopplers negative -antiplatelet therapy: Aspirin 81 mg daily 3. Pain Management:Tylenol as needed.    Topamax increased to twice daily for headaches, decreased to nightly on 9/24.- HA yesterday pm which resolved after medication   4. Mood:Provide emotional support  Question of suicidal ideation on 9/24, psych consulted, recommended Cymbalta.  Per therapies, pt with inconsistent cognition and memory with possibility of manipulative behavior. requested Neuropsych consult. -antipsychotic agents: N/A 5. Neuropsych: This patientis notcapable of making decisions on hisown behalf. 6. Skin/Wound Care:Routine skin checks 7. Fluids/Electrolytes/Nutrition:Routine I/Os.    BMP within acceptable range on 9/25 8. Seizure disorder. Vimpat 100 mg twice daily, Depakote 1000 mg twice daily. EEG negative  No seizures since admission to rehab- 9/27, has very long latency of response with poor attention.  Do not feel that these represent  seizures 9. Post stroke dysphagia. Follow-up speech therapy. Diet has been advanced to regular as of 10/04/2018. 10. Hypertension.  Patient on metoprolol 25 mg twice daily prior to admission. Resume as needed Vitals:   10/25/18 1944 10/26/18 0406  BP: (!) 131/97 (!) 141/100  Pulse: 83 80  Resp: 19 18  Temp: 98.1 F (36.7 C) 98.1 F (36.7 C)  SpO2: 97% 98%   Resumed metoprolol at 12.5mg  bid on 9/21- increase to 25mg   Diastolic blood pressures remain slightly elevated variable, generally controlled  11. Hyperlipidemia. Lipitor 80 mg daily 12. Neurogenic bladder. generally continent. 13. Chronic thrombocytopenia.   Platelets 113 on 9/21   Continue to monitor 14.  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  Would be a good candidate for Botox.  If samples are available may be able to do prior to SNF otherwise will need to follow-up as outpatient  LOS: 21 days A FACE TO FACE EVALUATION WAS PERFORMED  Patrick Franklin 10/26/2018, 7:40 AM

## 2018-10-26 NOTE — Progress Notes (Addendum)
Speech Language Pathology Weekly Progress and Session Note  Patient Details  Name: Patrick Franklin MRN: [id number] Date of Birth: November 11, 1970  Beginning of progress report period: October 19, 2018 End of progress report period: October 26, 2018  Today's Date: 10/26/2018 SLP Individual Time: 1100-1155 SLP Individual Time Calculation (min): 55 min  Short Term Goals: Week 3: SLP Short Term Goal 1 (Week 3): Patient will attend to basic, familiar task for 5 minutes with Mod verbal cues for redirection. SLP Short Term Goal 1 - Progress (Week 3): Progressing toward goal SLP Short Term Goal 2 (Week 3): Patient will complete basic, familiar tasks with Mod a verbal cues for functional problem solving. SLP Short Term Goal 2 - Progress (Week 3): Progressing toward goal SLP Short Term Goal 3 (Week 3): Pt will recall basic biographical and/or daily information with Mod A verbal/visual cues. SLP Short Term Goal 3 - Progress (Week 3): Progressing toward goal    New Short Term Goals: Week 4: SLP Short Term Goal 1 (Week 4): Patient will attend to basic, familiar task for 5 minutes with Mod verbal cues for redirection. SLP Short Term Goal 2 (Week 4): Pt will recall basic biographical and/or daily information with Mod A verbal/visual cues. SLP Short Term Goal 3 (Week 4): Patient will complete basic, familiar tasks with Mod a verbal cues for functional problem solving.  Weekly Progress Updates: Pt has made minimal functional gains this reporting period due to pt's fluctuating lethargy and limited engagement/behaviors that limit his participation in thearpes. He is progressing toward his long term goals (although he demonstrated inconsistent progress this week) as he is currently awaiting SNF placement. Pt is currently Mod-Max assist for due to cognitive impairments impacting emergent awareness, problem solving and attention. Pt has demonstrated improved sustained attention during preferred familiar tasks. Pt  and family education is ongoing. Pt would continue to benefit from skilled ST while inpatient in order to maximize functional independence and reduce burden of care prior to discharge. Anticipate that pt will need 24/7 supervision at discharge in addition to ST follow up at next level of care - SNF.     Intensity: Minumum of 1-2 x/day, 30 to 90 minutes Frequency: Total of 15 hours over 7 days of combined therapies Duration/Length of Stay: Pending SNF placement Treatment/Interventions: Cognitive remediation/compensation;Therapeutic Activities;Internal/external aids;Patient/family education;Cueing hierarchy;Functional tasks   Daily Session  Skilled Therapeutic Interventions: Pt was seen for skilled ST targeting cognition. Upon entrance to room, pt was asleep near bathroom entrance, leaning over side of his wheelchair with 2 towels in his lap, call bell had been accidentally unplugged from the wall. SLP repositioned pt to sitting upright in chair, re-connected call bell. Pt indicated he wanted to keep 1 towel in his lap to wipe saliva from left side of mouth. Pt sequenced 1 set of 3-step action picture cards (opening a present) with Min A verbal cues. He required Max-Total A to sequence pictures in remaining opportunities (~8). SLP further facilitated session with basic sorting task; pt sorted cards based on shape (field of 6), with overall Mod A for error awareness and correction. Pt continues to be easily distractible, however sustained attention for 1 5-minute interval with Mod A verbal cues for redirection today. Pt left in wheelchair with needs within reach and seatbelt alarm activated. Continue per current plan of care.       Pain Pain Assessment Pain Scale: 0-10 Pain Score: 0-No pain  Therapy/Group: Individual Therapy  Patrick Franklin 10/26/2018, 11:54 AM

## 2018-10-26 NOTE — Progress Notes (Addendum)
Physical Therapy Session Note  Patient Details  Name: Patrick Franklin MRN: [id number] Date of Birth: 02-23-1970  Today's Date: 10/26/2018 PT Individual Time: 0915-1015 PT Individual Time Calculation (min): 60 min   Short Term Goals: Week 2:  PT Short Term Goal 1 (Week 2): STG=LTG due to ELOS PT Short Term Goal 1 - Progress (Week 2): Progressing toward goal Week 3:  PT Short Term Goal 1 (Week 3): STG=LTG due to ELOS PT Short Term Goal 1 - Progress (Week 3): Progressing toward goal  Skilled Therapeutic Interventions/Progress Updates:  Pt in bed upon arrival for therapy. Pt agreeable to treatment. Pt transferred supine>sitting EOB with minA. SPT donned pt's shoes and L AFO. Pt performed stand pivot transfer with minA and hemiwalker to the wheelchair. Pt transported to the day room. Pt ambulated 30 feet x2 with minA and hemiwalker and verbal cueing for task multiple times. Pt performed sit>stand from wheelchair with minA and hemiwalker for bean bag toss towards corn hole board placed about 6 feet awayx3. Pt required minA throughout task and verbal cueing for stance width for safety and task. Pt expressed need to use the bathroom. Pt transported back to room and set up with urinal and was unsuccessful. No void. Pt instructed to ambulate out of the room and instead walked 5 feet with hemiwalker and L AFO towards the wall and was fixated on the hand sanitizer on the wall. Unable to redirect pt. So pt was transported into the hallway to walk and would not cooperate. Pt taken back into room, left sitting upright in whelechair, safety belt in place, call bell within reach.       Therapy Documentation Precautions:  Precautions Precautions: Fall Precaution Comments: Lt hemi + Lt hemianopsia at baseline Restrictions Weight Bearing Restrictions: No    Pain: denies pain   Therapy/Group: Individual Therapy   I attest that I was present for the following treatment session, and that the documentation  accurately reflects treatment performed.   Barrie Folk PT, DPT      Olena Leatherwood, SPT  10/26/2018, 9:49 AM

## 2018-10-26 NOTE — Progress Notes (Signed)
Occupational Therapy Session Note  Patient Details  Name: Patrick Franklin MRN: [id number] Date of Birth: 04/11/1970  Today's Date: 10/26/2018 OT Individual Time: 1345-1439 OT Individual Time Calculation (min): 54 min  21 minutes missed  Short Term Goals: Week 3:  OT Short Term Goal 1 (Week 3): STGs=LTGs secondary to upcoming discharge  Skilled Therapeutic Interventions/Progress Updates:    Pt greeted in bed with no s/s pain. Repeating the words "I want to brush my teeth and go to bed." When asked about his day so far or if he had pain, this was how he responded. Told pt that OT needed to gather supplies because his toothbrush and toothpaste were gone, as was his mouth wash. Brought needed items to room and presented them to him before placing them on the sink. Pt initiated supine<sit using the bedrail. When he stood, instead of transferring to the w/c, he began ambulating towards his nightstand using the hemi walker with Mod A. Per pt "I need to get a new toothbrush." Positioned a bedside chair behind him and pt sat down. Pt completed a stand pivot<w/c using device this time around when given instruction and pt verbalizing this instruction aloud. While at the sink, pt was very internally distracted, asking OT if she was providing therapy services to his mother at home or in [place]. Reminded pt that he was the one receiving therapy due to CVA. Pt raising eyebrows and appearing surprised by this. Able to state that he was in [place] and at [facility]." Pt asked OT the same question two more times. Max A for oral care and hand washing at the sink with pt unable to initiate himself when given significantly increased time to do so. He then urgently stated "I need to go to bed please" and repeated this, holding onto OT's arm. Stand pivot<bed completed with Mod A. Note he was impulsive with movement and did not use the hemi walker. Pt appeared more panicked when lying down, asking OT who she was  and repeatedly asking for his blankets. Calming cues provided so pt would assist with boosting up in bed and repositioning before applying his blankets. He then stated "I have to pee I have to pee" and repeated these words, once again in a panicked-tone. When presented with BSC and urinal, pt opted for the urinal. He would not sit EOB, so assisted him with clothing mgt and urinal bedlevel. Pt continent of bladder void. He was able to elevate brief and pants on his Rt side but needed A on the Lt. Multimodal cues for rolling Lt>Rt, and Max A for rolling towards the Rt. At times during bed mobility, pt looked blankly at the wall for several seconds. Tactile cues for bringing his attention back to task. Once he was repositioned for comfort, with bed alarm set and call bell in hand, notified RN of seizure-like activity. Pt was also reporting a HA at this time, so provided lavender to use via inhalation in room. He was left in care of RN to receive pain medicine for his HA. Time missed due to fatigue.       Therapy Documentation Precautions:  Precautions Precautions: Fall Precaution Comments: Lt hemi + Lt hemianopsia at baseline Restrictions Weight Bearing Restrictions: No Pain: Pain Assessment Pain Scale: 0-10 Pain Score: 3  Pain Type: Acute pain Pain Location: Head Pain Descriptors / Indicators: Headache Pain Frequency: Intermittent Pain Onset: Gradual Patients Stated Pain Goal: 0 Pain Intervention(s): Medication (See eMAR) ADL: ADL Eating: Not  assessed Grooming: Moderate assistance(combing hair) Where Assessed-Grooming: Chair Upper Body Bathing: Maximal assistance Where Assessed-Upper Body Bathing: Edge of bed Lower Body Bathing: Dependent(2 assist) Where Assessed-Lower Body Bathing: Edge of bed Upper Body Dressing: Maximal assistance Where Assessed-Upper Body Dressing: Edge of bed Lower Body Dressing: Dependent(2 assist) Where Assessed-Lower Body Dressing: Edge of bed Toileting:  Dependent(using Stedy) Where Assessed-Toileting: Bedside Commode Toilet Transfer: Maximal assistance Toilet Transfer Method: Stand pivot Acupuncturist: Animator Transfer: Not assessed      Therapy/Group: Individual Therapy  Oakleigh Hesketh A Joncarlos Atkison 10/26/2018, 3:58 PM

## 2018-10-27 ENCOUNTER — Inpatient Hospital Stay (HOSPITAL_COMMUNITY): Payer: Medicare Other | Admitting: Physical Therapy

## 2018-10-27 ENCOUNTER — Inpatient Hospital Stay (HOSPITAL_COMMUNITY): Payer: Medicare Other | Admitting: Occupational Therapy

## 2018-10-27 NOTE — Progress Notes (Signed)
Physical Therapy Session Note  Patient Details  Name: Patrick Franklin MRN: [id number] Date of Birth: 09/25/1970  Today's Date: 10/27/2018 PT Individual Time: 0354-6568 PT Individual Time Calculation (min): 63 min   Short Term Goals: Week 4:  PT Short Term Goal 1 (Week 4): STG=LTG due to ELOS  Skilled Therapeutic Interventions/Progress Updates:    Pt received supine in bed wearing L LE AFO and L UE shoulder subluxation sling and pt agreeable to therapy session. Supine>sit with HOB elevated and using bedrails with min/mod assist for L LE management and trunk upright. Pt continues to demonstrate L inattention, delayed processing, impaired motor planning, and impaired awareness during session. Pt reporting pain on plantar surface of L foot - therapist removed AFO and sock to assess skin integrity and no redness noted in the areas of pt's complaints; however, did notice some mild edema/swelling in pt's lower leg causing brace to be very snug. Therapist donned AFO & B shoes max assist. Sit<>stands using hemiwalker with CGA/min assist for steadying/balance throughout session. Stand pivot EOB>w/c using hemiwalker with min assist for balance. R hemi-technique w/c propulsion ~159ft to therapy gym with max cuing for directions with therapist leading the path to gym for increased L attention. Ambulated 71ft using hemiwalker and CGA progressed to min assist for balance due to decreased L LE foot clearance during swing with fatigue - demonstrates L hip and tibia external rotation with compensation from adductors for swing phase due to L hip flexor weakness and pt unable to correct with cuing due to L HB inattention and poor awareness. At end of gait pt required max assist to place hips into w/c seat due to pt turning the wrong way despite cuing and pt stating "oh, I turned the wrong way." Attempted, standing with R UE support on hemiwalker, repeated L LE foot taps up/down on 4" step with external targets on and  off of the step as well as mirror feedback but pt unable to motor plan and sequence the task requiring max assist to return to sitting in w/c to avoid anterior LOB. Performed dynamic standing balance task of grasping and placing clothes pins on basketball goal in L visual field - pt required min assist for balance due to progressively worsening anterior and L lateral trunk lean as pt is unable to dual task focusing on placing clothespins and maintaining balance. Pt reporting fatigue and need to have BM. Transported back to room in w/c. Sit>stand w/c>stedy with min assist. Stedy transport to [facility] over toilet. Sit<>stand in stedy with min assist and standing with CGA while therapist doffed LB clothing total assist. Pt left sitting on BSC over toilet with stedy in place and NT present to assume care of patient.  Therapy Documentation Precautions:  Precautions Precautions: Fall Precaution Comments: Lt hemi + Lt hemianopsia at baseline Restrictions Weight Bearing Restrictions: No  Pain: Reports pain on plantar surface of L foot - details above.    Therapy/Group: Individual Therapy  Tawana Scale, PT, DPT 10/27/2018, 3:26 PM

## 2018-10-27 NOTE — Progress Notes (Signed)
[place] PHYSICAL MEDICINE & REHABILITATION PROGRESS NOTE   Subjective/Complaints: No issues overnite - pt says he has no complaints- took 3-4 minutes to get him to understand questions.  "feeling OK".  ROS: Limited due to cognitive/behavioral   Objective:   No results found. No results for input(s): WBC, HGB, HCT, PLT in the last 72 hours. No results for input(s): NA, K, CL, CO2, GLUCOSE, BUN, CREATININE, CALCIUM in the last 72 hours.  Intake/Output Summary (Last 24 hours) at 10/27/2018 1448 Last data filed at 10/27/2018 1400 Gross per 24 hour  Intake 583 ml  Output 250 ml  Net 333 ml     Physical Exam: Vital Signs Blood pressure (!) 113/92, pulse 64, temperature 98.7 F (37.1 C), resp. rate 17, height 5\' 10"  (1.778 m), weight 87.2 kg, SpO2 97 %. Constitutional: No distress . Vital signs reviewed. Sitting up in manual w/c at bedside, NAD HEENT: EOMI, oral membranes moist Neck: supple Cardiovascular: RRR without murmur. No JVD    Respiratory: CTA Bilaterally without wheezes or rales. Normal effort    GI: BS +, non-tender, non-distended  Skin: Warm and dry.  Intact. Psych: Limited to due to cognition- flat affect Musc: No edema in extremities.  No tenderness in extremities. 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 Functional deficits secondary to new posterior left MCA distribution infarct in the setting of prior right MCA infarct with chronic left hemiparesis which require 3+ hours per day of interdisciplinary therapy in a comprehensive inpatient rehab setting.  Physiatrist is providing close team supervision and 24 hour management of active medical problems listed below.  Physiatrist and  rehab team continue to assess barriers to discharge/monitor patient progress toward functional and medical goals  Care Tool:  Bathing    Body parts bathed by patient: Face, Right upper leg, Left upper leg, Chest, Front perineal area, Abdomen, Left arm   Body parts bathed by helper: Right arm, Buttocks, Right lower leg, Left lower leg Body parts n/a: Right lower leg, Left lower leg   Bathing assist Assist Level: Moderate Assistance - Patient 50 - 74%     Upper Body Dressing/Undressing Upper body dressing   What is the patient wearing?: Pull over shirt    Upper body assist Assist Level: Maximal Assistance - Patient 25 - 49%    Lower Body Dressing/Undressing Lower body dressing      What is the patient wearing?: Pants     Lower body assist Assist for lower body dressing: Maximal Assistance - Patient 25 - 49%     Toileting Toileting    Toileting assist Assist for toileting: Moderate Assistance - Patient 50 - 74%     Transfers Chair/bed transfer  Transfers assist     Chair/bed transfer assist level: Minimal Assistance - Patient > 75%     Locomotion Ambulation   Ambulation assist      Assist level: Minimal Assistance - Patient > 75% Assistive device: Walker-hemi Max distance: 30   Walk 10 feet activity   Assist  Walk 10 feet activity did not occur: Safety/medical concerns  Assist level: Minimal Assistance - Patient > 75% Assistive device: Orthosis, Walker-hemi   Walk 50 feet activity   Assist Walk 50 feet with 2 turns activity did  not occur: Safety/medical concerns  Assist level: Minimal Assistance - Patient > 75% Assistive device: Walker-hemi, Orthosis    Walk 150 feet activity   Assist Walk 150 feet activity did not occur: Safety/medical concerns         Walk 10 feet on uneven surface  activity   Assist Walk 10 feet on uneven surfaces activity did not occur: Safety/medical concerns         Wheelchair     Assist Will  patient use wheelchair at discharge?: Yes Type of Wheelchair: Manual Wheelchair activity did not occur: Safety/medical concerns  Wheelchair assist level: Supervision/Verbal cueing Max wheelchair distance: 134ft    Wheelchair 50 feet with 2 turns activity    Assist    Wheelchair 50 feet with 2 turns activity did not occur: Safety/medical concerns   Assist Level: Supervision/Verbal cueing   Wheelchair 150 feet activity     Assist  Wheelchair 150 feet activity did not occur: Safety/medical concerns   Assist Level: Supervision/Verbal cueing   Blood pressure (!) 113/92, pulse 64, temperature 98.7 F (37.1 C), resp. rate 17, height 5\' 10"  (1.778 m), weight 87.2 kg, SpO2 97 %.  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 Patient has hx ofprior right MCA infarction as well as VP shunt for hydrocephalus  Continue CIR PT OT speech Would benefit from botox may need to do as OP   SNF pending 2. Antithrombotics: -DVT/anticoagulation:SCDs. Venous Dopplers negative -antiplatelet therapy: Aspirin 81 mg daily 3. Pain Management:Tylenol as needed.    Topamax increased to twice daily for headaches, decreased to nightly on 9/24.- HA yesterday pm which resolved after medication   4. Mood:Provide emotional support  Question of suicidal ideation on 9/24, psych consulted, recommended Cymbalta.  Per therapies, pt with inconsistent cognition and memory with possibility of manipulative behavior. requested Neuropsych consult. -antipsychotic agents: N/A 5. Neuropsych: This patientis notcapable of making decisions on hisown behalf. 6. Skin/Wound Care:Routine skin checks 7. Fluids/Electrolytes/Nutrition:Routine I/Os.    BMP within acceptable range on 9/25 8. Seizure disorder. Vimpat 100 mg twice daily, Depakote 1000 mg twice daily.  EEG negative  No seizures since admission to rehab- 9/27, has very long latency of response with poor attention.  Do not feel that these represent seizures 9. Post stroke dysphagia. Follow-up speech therapy. Diet has been advanced to regular as of 10/04/2018. 10. Hypertension.  Patient on metoprolol 25 mg twice daily prior to admission. Resume as needed Vitals:   10/26/18 2047 10/27/18 0527  BP: (!) 126/97 (!) 113/92  Pulse: 84 64  Resp: 16 17  Temp: 98.1 F (36.7 C) 98.7 F (37.1 C)  SpO2: 95% 97%   Resumed metoprolol at 12.5mg  bid on 9/21- increase to 25mg   Diastolic blood pressures remain slightly elevated variable, generally controlled  11. Hyperlipidemia. Lipitor 80 mg daily 12. Neurogenic bladder. generally continent. 13. Chronic thrombocytopenia.   Platelets 113 on 9/21   Continue to monitor 14.  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  Would be a good candidate for Botox.  If samples are available may be able to do prior to SNF otherwise will need to follow-up as outpatient  LOS: 22 days A FACE TO FACE EVALUATION WAS PERFORMED  Patrick Franklin 10/27/2018, 2:48 PM

## 2018-10-27 NOTE — Plan of Care (Signed)
°  Problem: RH BLADDER ELIMINATION Goal: RH STG MANAGE BLADDER WITH ASSISTANCE Description: STG Manage Bladder With min Assistance Outcome: Not Progressing; patient have continent and incontinent episodes

## 2018-10-27 NOTE — Progress Notes (Signed)
Occupational Therapy Session Note  Patient Details  Name: Patrick Franklin MRN: [id number] Date of Birth: 1970-09-16  Today's Date: 10/27/2018 OT Individual Time: 0900-1000 OT Individual Time Calculation (min): 60 min    Short Term Goals: Week 3:  OT Short Term Goal 1 (Week 3): STGs=LTGs secondary to upcoming discharge  Skilled Therapeutic Interventions/Progress Updates:    Patient in bed, alert, increased time for processing with each activity.  He is pleasant and cooperative t/o session.  LB dressing max /dependent, clothing management in stance with mod a.  Oral care seated in w/c with set up, shaving mod A with electric razor.  Supine to SSP min A.  Sit to stand min A/CG with cues.  SPT and short distance ambulation with hemi walker with min A, facilitation/cues for left LE positioning.  Unsupported sitting on mat table with CS.  Completed trunk ROM and stretch with focus on midline posture and seated balance for reach.  Left UE weight bearing activity with good tolerance.  Patient remained seated in w/c at close of session with seat belt alarm set and call bell in reach.    Therapy Documentation Precautions:  Precautions Precautions: Fall Precaution Comments: Lt hemi + Lt hemianopsia at baseline Restrictions Weight Bearing Restrictions: No General:   Vital Signs:  Pain: Pain Assessment Pain Scale: 0-10 Pain Score: 0-No pain Other Treatments:     Therapy/Group: Individual Therapy  Carlos Levering 10/27/2018, 12:11 PM

## 2018-10-28 ENCOUNTER — Inpatient Hospital Stay (HOSPITAL_COMMUNITY): Payer: Medicare Other | Admitting: Occupational Therapy

## 2018-10-28 ENCOUNTER — Inpatient Hospital Stay (HOSPITAL_COMMUNITY): Payer: Medicare Other | Admitting: Speech Pathology

## 2018-10-28 ENCOUNTER — Inpatient Hospital Stay (HOSPITAL_COMMUNITY): Payer: Medicare Other

## 2018-10-28 NOTE — Progress Notes (Signed)
Occupational Therapy Session Note  Patient Details  Name: Patrick Franklin MRN: [id number] Date of Birth: 09/09/1970  Today's Date: 10/28/2018 OT Individual Time: 0277-4128 OT Individual Time Calculation (min): 42 min   Short Term Goals: Week 3:  OT Short Term Goal 1 (Week 3): STGs=LTGs secondary to upcoming discharge  Skilled Therapeutic Interventions/Progress Updates:    Pt greeted in bed, asleep, raising eyebrows in response to his name but keeping eyes closed. Max A for supine<sit due to lethargy and behaviors. While EOB, worked on functional cognition while he ate his lunch. He needed the physical presentation of food on his fork 75% of the time to initiate eating. Pt often sat idly and paused his chewing. Note he'd also fidget with items on tray or circle his food around on the plate while using a utensil. Multimodal cuing for sustained attention to task. He tried to return to bed before swallowing the food in his mouth. When OT presented a beverage and provided cuing, he set the beverage on his tray. After about ten seconds, he then asked for the beverage again to drink. Supervision for returning to supine and Mod A for boosting up in bed. Pt remained in the bed with all needs within reach, repositioned to protect hemiplegic side, and bed alarm set.   Therapy Documentation Precautions:  Precautions Precautions: Fall Precaution Comments: Lt hemi + Lt hemianopsia at baseline Restrictions Weight Bearing Restrictions: No Vital Signs: Therapy Vitals Temp: 97.7 F (36.5 C) Temp Source: Oral Pulse Rate: 68 Resp: 18 BP: 117/87 Patient Position (if appropriate): Lying Oxygen Therapy SpO2: 97 % O2 Device: Room Air Pain: No s/s pain during tx   ADL: ADL Eating: Not assessed Grooming: Moderate assistance(combing hair) Where Assessed-Grooming: Chair Upper Body Bathing: Maximal assistance Where Assessed-Upper Body Bathing: Edge of bed Lower Body Bathing: Dependent(2  assist) Where Assessed-Lower Body Bathing: Edge of bed Upper Body Dressing: Maximal assistance Where Assessed-Upper Body Dressing: Edge of bed Lower Body Dressing: Dependent(2 assist) Where Assessed-Lower Body Dressing: Edge of bed Toileting: Dependent(using Stedy) Where Assessed-Toileting: Bedside Commode Toilet Transfer: Maximal assistance Toilet Transfer Method: Stand pivot Science writer: Engineer, technical sales Transfer: Not assessed      Therapy/Group: Individual Therapy  Sukari Grist A Arine Foley 10/28/2018, 3:49 PM

## 2018-10-28 NOTE — Progress Notes (Signed)
Speech Language Pathology Daily Session Note  Patient Details  Name: Patrick Franklin MRN: [id number] Date of Birth: 1970-03-01  Today's Date: 10/28/2018 SLP Individual Time: 0805-0900 SLP Individual Time Calculation (min): 55 min  Short Term Goals: Week 4: SLP Short Term Goal 1 (Week 4): Patient will attend to basic, familiar task for 5 minutes with Mod verbal cues for redirection. SLP Short Term Goal 2 (Week 4): Pt will recall basic biographical and/or daily information with Mod A verbal/visual cues. SLP Short Term Goal 3 (Week 4): Patient will complete basic, familiar tasks with Mod a verbal cues for functional problem solving.  Skilled Therapeutic Interventions:  Pt was seen for skilled ST targeting cognitive goals.  Pt was in bed with nursing at bedside upon therapist's arrival.  Nursing was completing hygiene at bed level after pt had been incontinent of stool.  Pt was transferred to wheelchair with +2 assist and Stedy lift.  During transfer pt needed mod verbal cues for redirection to task as he became easily distracted both internally and externally.  Once pt was positioned in the wheelchair, therapist facilitated the session with max assist multimodal cues for initiation and organization while self feeding. Despite careful control of environmental factors (closed door to room, turned TV off, limited breakfast tray presentation to no more than 2 items at a time) therapist was unable to fade cues from max assist for basic problem solving and pt ate <25% of his meal in ~45 minutes.  Pt did indicate that he was still hungry so he was left sitting up in his wheelchair with chair alarm set.  Table was moved out of pt's reach until nursing could resume full supervision.  Nursing made aware that pt was still up in his chair as pt's safety plan indicates that he is not to be left alone in wheelchair.  RN reported that she would assist pt either getting back in bed or finishing meal.  Continue per  current plan of care.    Pain Pain Assessment Pain Scale: 0-10 Pain Score: 0-No pain  Therapy/Group: Individual Therapy  Ozell Ferrera, Selinda Orion 10/28/2018, 9:12 AM

## 2018-10-28 NOTE — Progress Notes (Signed)
Physical Therapy Session Note  Patient Details  Name: Patrick Franklin MRN: [id number] Date of Birth: 12-14-1970  Today's Date: 10/28/2018 PT Individual Time: 5809-9833 PT Individual Time Calculation (min): 30 min   Short Term Goals: Week 4:  PT Short Term Goal 1 (Week 4): STG=LTG due to ELOS  Skilled Therapeutic Interventions/Progress Updates:     Patient in bed asleep upon PT arrival. Patient required heavy verbal and tactile stimulation to arouse and agreeable to PT session. Requested to go to the bathroom at beginning of session. Patient perseverated on phrases throughout session, such as "I need to go to the bathroom," or "I'm not done" several times before being able to attend to a task. He continues to demonstrate increased L UE and LE tone, delayed initiation, and poor motor control this session.   Therapeutic Activity: Bed Mobility: Patient performed supine to/from sit with min A for LE and trunk management with use of bed rail in a flat bed. Provided verbal cues for bringing LEs off the bed before sitting up and attempting to scoop L LE with R to bring LEs up to the bed. Also provided cues for lying on R side and using R elbow to push up to his elbow, then his hand for decreased assistance with mobility.  Transfers: Patient performed sit to/from stand x1 from the bed and x1 from the [facility] using the [facility] with min A. Provided verbal cues for timing. Patient with increased L lean in stedy during transfers with heavy cues and manual facilitation for erect posture and L quad activation in standing. Patient was continent of bowl and bladder while on BSC and required total A for peri-care and LB dressing during toileting.   Patient in bed at end of session with breaks locked, bed alarm set, and all needs within reach.    Therapy Documentation Precautions:  Precautions Precautions: Fall Precaution Comments: Lt hemi + Lt hemianopsia at baseline Restrictions Weight Bearing Restrictions:  No    Therapy/Group: Individual Therapy  Sandrine Bloodsworth L Deirdre Gryder PT, DPT  10/28/2018, 4:15 PM

## 2018-10-28 NOTE — Progress Notes (Signed)
[facility] PHYSICAL MEDICINE & REHABILITATION PROGRESS NOTE   Subjective/Complaints: No issues overnite -pt says doing "ok".      ROS: Limited due to cognitive/behavioral   Objective:   No results found. No results for input(s): WBC, HGB, HCT, PLT in the last 72 hours. No results for input(s): NA, K, CL, CO2, GLUCOSE, BUN, CREATININE, CALCIUM in the last 72 hours.  Intake/Output Summary (Last 24 hours) at 10/28/2018 1441 Last data filed at 10/28/2018 1318 Gross per 24 hour  Intake 342 ml  Output --  Net 342 ml     Physical Exam: Vital Signs Blood pressure 131/86, pulse 70, temperature 97.6 F (36.4 C), resp. rate 17, height 5\' 10"  (1.778 m), weight 87.2 kg, SpO2 97 %. Constitutional: No distress . Vital signs reviewed. Sitting up in manual w/c at bedside, food from breakfast still crumbs on face,  NAD HEENT: EOMI, oral membranes moist Neck: supple Cardiovascular: RRR without murmur. No JVD    Respiratory: CTA Bilaterally without wheezes or rales. Normal effort    GI: BS +, non-tender, non-distended  Skin: Warm and dry.  Intact. Psych: Limited to due to cognition- flat affect Musc: No edema in extremities.  No tenderness in extremities. 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 Functional deficits secondary to new posterior left MCA distribution infarct in the setting of prior right MCA infarct with chronic left hemiparesis which require 3+ hours per day of interdisciplinary therapy in a comprehensive inpatient rehab setting.  Physiatrist is providing close team supervision and 24 hour management of active medical problems listed below.  Physiatrist and rehab team continue to assess barriers  to discharge/monitor patient progress toward functional and medical goals  Care Tool:  Bathing    Body parts bathed by patient: Face, Right upper leg, Left upper leg, Chest, Front perineal area, Abdomen, Left arm   Body parts bathed by helper: Right arm, Buttocks, Right lower leg, Left lower leg Body parts n/a: Right lower leg, Left lower leg   Bathing assist Assist Level: Moderate Assistance - Patient 50 - 74%     Upper Body Dressing/Undressing Upper body dressing   What is the patient wearing?: Pull over shirt    Upper body assist Assist Level: Maximal Assistance - Patient 25 - 49%    Lower Body Dressing/Undressing Lower body dressing      What is the patient wearing?: Pants     Lower body assist Assist for lower body dressing: Maximal Assistance - Patient 25 - 49%     Toileting Toileting    Toileting assist Assist for toileting: Moderate Assistance - Patient 50 - 74%     Transfers Chair/bed transfer  Transfers assist     Chair/bed transfer assist level: Minimal Assistance - Patient > 75%     Locomotion Ambulation   Ambulation assist      Assist level: Minimal Assistance - Patient > 75% Assistive device: Walker-hemi Max distance: 22ft   Walk 10 feet activity   Assist  Walk 10 feet activity did not occur: Safety/medical concerns  Assist level: Minimal Assistance - Patient > 75% Assistive device: Orthosis, Walker-hemi   Walk 50 feet activity   Assist Walk 50 feet with 2 turns activity did not occur: Safety/medical concerns  Assist level: Minimal Assistance - Patient > 75% Assistive device: Walker-hemi, Orthosis    Walk 150 feet activity   Assist Walk 150 feet activity did not occur: Safety/medical concerns         Walk 10 feet on uneven surface  activity   Assist Walk 10 feet on uneven surfaces activity did not occur: Safety/medical concerns         Wheelchair     Assist Will patient use wheelchair at discharge?:  Yes Type of Wheelchair: Manual Wheelchair activity did not occur: Safety/medical concerns  Wheelchair assist level: Supervision/Verbal cueing Max wheelchair distance: 177ft    Wheelchair 50 feet with 2 turns activity    Assist    Wheelchair 50 feet with 2 turns activity did not occur: Safety/medical concerns   Assist Level: Supervision/Verbal cueing   Wheelchair 150 feet activity     Assist  Wheelchair 150 feet activity did not occur: Safety/medical concerns   Assist Level: Supervision/Verbal cueing   Blood pressure 131/86, pulse 70, temperature 97.6 F (36.4 C), resp. rate 17, height 5\' 10"  (1.778 m), weight 87.2 kg, SpO2 97 %.  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 Patient has hx ofprior right MCA infarction as well as VP shunt for hydrocephalus  Continue CIR PT OT speech Would benefit from botox may need to do as OP   SNF pending 2. Antithrombotics: -DVT/anticoagulation:SCDs. Venous Dopplers negative -antiplatelet therapy: Aspirin 81 mg daily 3. Pain Management:Tylenol as needed.    Topamax increased to twice daily for headaches, decreased to nightly on 9/24.- HA yesterday pm which resolved after medication   4. Mood:Provide emotional support  Question of suicidal ideation on 9/24, psych consulted, recommended Cymbalta.  Per therapies, pt with inconsistent cognition and memory with possibility of manipulative behavior. requested Neuropsych consult. -antipsychotic agents: N/A 5. Neuropsych: This patientis notcapable of making decisions on hisown behalf. 6. Skin/Wound Care:Routine skin checks 7. Fluids/Electrolytes/Nutrition:Routine I/Os.    BMP within acceptable range on 9/25 8. Seizure disorder. Vimpat 100 mg twice daily, Depakote 1000 mg twice daily. EEG negative  No seizures since admission  to rehab- 9/27, has very long latency of response with poor attention.  Do not feel that these represent seizures 9. Post stroke dysphagia. Follow-up speech therapy. Diet has been advanced to regular as of 10/04/2018. 10. Hypertension.  Patient on metoprolol 25 mg twice daily prior to admission. Resume as needed Vitals:   10/27/18 2028 10/28/18 0401  BP: 123/88 131/86  Pulse: 84 70  Resp: 17 17  Temp: 97.8 F (36.6 C) 97.6 F (36.4 C)  SpO2: 99% 97%   Resumed metoprolol at 12.5mg  bid on 9/21- increase to 25mg   Diastolic blood pressures remain slightly elevated variable, generally controlled   11. Hyperlipidemia. Lipitor 80 mg daily 12. Neurogenic bladder. generally continent. 13. Chronic thrombocytopenia.   Platelets 113 on 9/21   Continue to monitor 14.  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  Would be a good candidate for Botox.  If samples are available may be able to do prior to SNF otherwise will need to follow-up as outpatient  LOS: 23 days A FACE TO FACE EVALUATION WAS PERFORMED  Patrick Franklin 10/28/2018, 2:41 PM

## 2018-10-28 NOTE — Plan of Care (Signed)
°  Problem: RH SAFETY Goal: RH STG ADHERE TO SAFETY PRECAUTIONS W/ASSISTANCE/DEVICE Description: STG Adhere to Safety Precautions With mod/max Assistance/Device. Outcome: Not Progressing; cannot be left alone in wheelchair in room pt has to be in bed per safety plan   Problem: RH BLADDER ELIMINATION Goal: RH STG MANAGE BLADDER WITH ASSISTANCE Description: STG Manage Bladder With min Assistance Outcome: Not Progressing; incontinence   Problem: RH KNOWLEDGE DEFICIT Goal: RH STG INCREASE KNOWLEDGE OF HYPERTENSION Description: Patient/caregiver will verbalize understanding of HTN including diet, exercise, medications, monitoring, and follow up care with mod assist. 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

## 2018-10-29 ENCOUNTER — Inpatient Hospital Stay (HOSPITAL_COMMUNITY): Payer: Medicare Other | Admitting: Physical Therapy

## 2018-10-29 ENCOUNTER — Inpatient Hospital Stay (HOSPITAL_COMMUNITY): Payer: Medicare Other | Admitting: Occupational Therapy

## 2018-10-29 ENCOUNTER — Inpatient Hospital Stay (HOSPITAL_COMMUNITY): Payer: Medicare Other | Admitting: Speech Pathology

## 2018-10-29 NOTE — Progress Notes (Signed)
Occupational Therapy Session Note  Patient Details  Name: Patrick Franklin MRN: [id number] Date of Birth: Oct 13, 1970  Today's Date: 10/29/2018 OT Individual Time: 2979-8921 OT Individual Time Calculation (min): 57 min   Short Term Goals: Week 3:  OT Short Term Goal 1 (Week 3): STGs=LTGs secondary to upcoming discharge  Skilled Therapeutic Interventions/Progress Updates:    Pt greeted in bed with NT present, in the middle of eating breakfast. Pt was agreeable to eat EOB and needed Mod A for supine<sit. Per pt: "It'll be easier to eat this way." More alert and more responsive to verbal cues this AM when compared to session yesterday. OT set up room environment to minimize stimulation. Worked on sitting balance and sustained attention during his meal. Pt required mod multimodal cues to meet task demands due to internal distraction. Able to reach for fork when cued today vs needing OT to set him up with bites like yesterday. Afterwards, worked on ADL retraining during dressing tasks sit<stand from [place]. Min A for doffing/donning overhead shirt. Total A for sublux brace and Total A for LB dressing including his Lt AFO. Min A for applying deodorant and Max A for washing hands with hand sanitizer. Min A sit<stand and Min A for standing balance using hemi walker for support. Note that pt still perseverates on phrases and often needs cues repeated several times and expressed in different ways. Pt returned to bed and boosted himself up with use of headboard. Repositioned pt to protect hemiplegic side and left him with all needs within reach and bed alarm set.     Therapy Documentation Precautions:  Precautions Precautions: Fall Precaution Comments: Lt hemi + Lt hemianopsia at baseline Restrictions Weight Bearing Restrictions: No Pain: No s/s pain during tx Pain Assessment Pain Scale: 0-10 Pain Score: 0-No pain ADL: ADL Eating: Not assessed Grooming: Moderate assistance(combing hair) Where  Assessed-Grooming: Chair Upper Body Bathing: Maximal assistance Where Assessed-Upper Body Bathing: Edge of bed Lower Body Bathing: Dependent(2 assist) Where Assessed-Lower Body Bathing: Edge of bed Upper Body Dressing: Maximal assistance Where Assessed-Upper Body Dressing: Edge of bed Lower Body Dressing: Dependent(2 assist) Where Assessed-Lower Body Dressing: Edge of bed Toileting: Dependent(using Stedy) Where Assessed-Toileting: Bedside Commode Toilet Transfer: Maximal assistance Toilet Transfer Method: Stand pivot Science writer: Engineer, technical sales Transfer: Not assessed      Therapy/Group: Individual Therapy  Esterlene Atiyeh A Denetta Fei 10/29/2018, 12:20 PM

## 2018-10-29 NOTE — Progress Notes (Signed)
[place] PHYSICAL MEDICINE & REHABILITATION PROGRESS NOTE   Subjective/Complaints: Eating ok except bacan an dsausage causing heartburn     ROS: Limited due to cognitive/behavioral   Objective:   No results found. No results for input(s): WBC, HGB, HCT, PLT in the last 72 hours. No results for input(s): NA, K, CL, CO2, GLUCOSE, BUN, CREATININE, CALCIUM in the last 72 hours.  Intake/Output Summary (Last 24 hours) at 10/29/2018 0738 Last data filed at 10/29/2018 0021 Gross per 24 hour  Intake 582 ml  Output 250 ml  Net 332 ml     Physical Exam: Vital Signs Blood pressure 125/85, pulse 61, temperature 98.3 F (36.8 C), temperature source Oral, resp. rate 17, height 5\' 10"  (1.778 m), weight 87.2 kg, SpO2 96 %. Constitutional: No distress . Vital signs reviewed. Sitting up in manual w/c at bedside, food from breakfast still crumbs on face,  NAD HEENT: EOMI, oral membranes moist Neck: supple Cardiovascular: RRR without murmur. No JVD    Respiratory: CTA Bilaterally without wheezes or rales. Normal effort    GI: BS +, non-tender, non-distended  Skin: Warm and dry.  Intact. Psych: Limited to due to cognition- flat affect Musc: No edema in extremities.  No tenderness in extremities. 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 Functional deficits secondary to new posterior left MCA distribution infarct in the setting of prior right MCA infarct with chronic left hemiparesis which require 3+ hours per day of interdisciplinary therapy in a comprehensive inpatient rehab setting.  Physiatrist is providing close team supervision and 24 hour management of active medical  problems listed below.  Physiatrist and rehab team continue to assess barriers to discharge/monitor patient progress toward functional and medical goals  Care Tool:  Bathing    Body parts bathed by patient: Face, Right upper leg, Left upper leg, Chest, Front perineal area, Abdomen, Left arm   Body parts bathed by helper: Right arm, Buttocks, Right lower leg, Left lower leg Body parts n/a: Right lower leg, Left lower leg   Bathing assist Assist Level: Moderate Assistance - Patient 50 - 74%     Upper Body Dressing/Undressing Upper body dressing   What is the patient wearing?: Pull over shirt    Upper body assist Assist Level: Maximal Assistance - Patient 25 - 49%    Lower Body Dressing/Undressing Lower body dressing      What is the patient wearing?: Pants     Lower body assist Assist for lower body dressing: Maximal Assistance - Patient 25 - 49%     Toileting Toileting    Toileting assist Assist for toileting: Moderate Assistance - Patient 50 - 74%     Transfers Chair/bed transfer  Transfers assist     Chair/bed transfer assist level: Minimal Assistance - Patient > 75%     Locomotion Ambulation   Ambulation assist      Assist level: Minimal Assistance - Patient > 75% Assistive device: Walker-hemi Max distance: 64ft   Walk 10 feet activity   Assist  Walk 10 feet activity did not occur: Safety/medical concerns  Assist level: Minimal Assistance - Patient > 75% Assistive device: Orthosis, Walker-hemi   Walk 50 feet activity   Assist Walk 50  feet with 2 turns activity did not occur: Safety/medical concerns  Assist level: Minimal Assistance - Patient > 75% Assistive device: Walker-hemi, Orthosis    Walk 150 feet activity   Assist Walk 150 feet activity did not occur: Safety/medical concerns         Walk 10 feet on uneven surface  activity   Assist Walk 10 feet on uneven surfaces activity did not occur: Safety/medical concerns          Wheelchair     Assist Will patient use wheelchair at discharge?: Yes Type of Wheelchair: Manual Wheelchair activity did not occur: Safety/medical concerns  Wheelchair assist level: Supervision/Verbal cueing Max wheelchair distance: 123ft    Wheelchair 50 feet with 2 turns activity    Assist    Wheelchair 50 feet with 2 turns activity did not occur: Safety/medical concerns   Assist Level: Supervision/Verbal cueing   Wheelchair 150 feet activity     Assist  Wheelchair 150 feet activity did not occur: Safety/medical concerns   Assist Level: Supervision/Verbal cueing   Blood pressure 125/85, pulse 61, temperature 98.3 F (36.8 C), temperature source Oral, resp. rate 17, height 5\' 10"  (1.778 m), weight 87.2 kg, SpO2 96 %.  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 Patient has hx ofprior right MCA infarction as well as VP shunt for hydrocephalus  Continue CIR PT OT speech Would benefit from botox may need to do as OP   SNF pending 2. Antithrombotics: -DVT/anticoagulation:SCDs. Venous Dopplers negative -antiplatelet therapy: Aspirin 81 mg daily 3. Pain Management:Tylenol as needed.    Topamax for HA 4. Mood:Provide emotional support  Question of suicidal ideation on 9/24, psych consulted, recommended Cymbalta.  Per therapies, pt with inconsistent cognition and memory with possibility of manipulative behavior. requested Neuropsych consult. -antipsychotic agents: N/A 5. Neuropsych: This patientis notcapable of making decisions on hisown behalf. 6. Skin/Wound Care:Routine skin checks 7. Fluids/Electrolytes/Nutrition:Routine I/Os.    BMP within acceptable range on 9/25 8. Seizure disorder. Vimpat 100 mg twice daily, Depakote 1000 mg twice daily. EEG negative  No seizures since admission to rehab-  9/27, has very long latency of response with poor attention.  Do not feel that these represent seizures 9. Post stroke dysphagia. Follow-up speech therapy. Diet has been advanced to regular as of 10/04/2018. 10. Hypertension.  Patient on metoprolol 25 mg twice daily prior to admission. Resume as needed Vitals:   10/28/18 2027 10/29/18 0548  BP: 131/89 125/85  Pulse: 65 61  Resp: 18 17  Temp: 98 F (36.7 C) 98.3 F (36.8 C)  SpO2: 99% 96%   Resumed metoprolol at 12.5mg  bid on 9/21- increase to 25mg  controlled Diastolic blood pressures remain slightly elevated variable, generally controlled   11. Hyperlipidemia. Lipitor 80 mg daily 12. Neurogenic bladder. generally continent. 13. Chronic thrombocytopenia.   Platelets 113 on 9/21   Continue to monitor 14.  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  Would be a good candidate for Botox.  If samples are available may be able to do prior to SNF otherwise will need to follow-up as outpatient  LOS: 24 days A FACE TO FACE EVALUATION WAS PERFORMED  10/29/2018, 7:38 AM

## 2018-10-29 NOTE — Progress Notes (Signed)
Speech Language Pathology Daily Session Note  Patient Details  Name: Patrick Franklin MRN: [id number] Date of Birth: [birth year]  Today's Date: 10/29/2018 SLP Individual Time: 1300-1345 SLP Individual Time Calculation (min): 45 min  Short Term Goals: Week 4: SLP Short Term Goal 1 (Week 4): Patient will attend to basic, familiar task for 5 minutes with Mod verbal cues for redirection. SLP Short Term Goal 2 (Week 4): Pt will recall basic biographical and/or daily information with Mod A verbal/visual cues. SLP Short Term Goal 3 (Week 4): Patient will complete basic, familiar tasks with Mod a verbal cues for functional problem solving.  Skilled Therapeutic Interventions: Pt was seen for skilled ST targeting cognitive goals. SLP facilitated session with assistance repositioning to upright seating in bed for safe lunch consumption. Mod-Max A multimodal cues were required for basic problem solving, consistent Max A for sustained attention throughout lunch, despite SLP's attempts to minimize external/environmental distractions (turned TV off, shut door, positioned cell phone out of reach, etc.). Pt responded well to direct commands to take a bite of specific food items (ex: "[name], eat the potato"), as opposed to generalized instruction ("let's eat"). Throughout session pt demonstrated spontaneous short term recall of therapy activities, such as dynavision and corn hole. Pt had consumed approximately 25% of his meal during 45 minute session and reported desire to continue eating, however due to pt's full supervision status SLP removed tray from pt's reach and requested NT assist him with further consumption. Pt was left in bed with alarm set and call bell within reach. Continue per current plan of care.       Pain Pain Assessment Pain Scale: Faces Pain Score: 0-No pain Faces Pain Scale: No hurt  Therapy/Group: Individual Therapy  Arbutus Leas 10/29/2018, 2:32 PM

## 2018-10-29 NOTE — Progress Notes (Signed)
Physical Therapy Session Note  Patient Details  Name: Patrick Franklin MRN: [id number] Date of Birth: 11/20/1970  Today's Date: 10/29/2018 PT Individual Time: 1001-1109 PT Individual Time Calculation (min): 68 min   Short Term Goals: Week 4:  PT Short Term Goal 1 (Week 4): STG=LTG due to ELOS  Skilled Therapeutic Interventions/Progress Updates: Pt presented in bed sleeping but easily aroused. Performed bed mobility with minA as pt had shoes/AFO in bed and performed stand pivot transfer with HW to w/c minA. Pt transported to ortho gym and participated in [place] for scanning. First trial performed from w/c with pt requiring max cues for finding red light and was unable to scan light in L field within 60 sec. On second trial from w/c pt was able to successfully find light in L field and sustain activity within 60 sec (score 18 reaction time 3.33 sec). Performed same activity in standing with increased time to initiate but able to perform (score 14  4.29 reaction time). Pt then transported to rehab gym and performed stand pivot to mat minA. Participated in x 1 round of horse shoes for dynamic reaching with pt requiring max cues for stepping away from mat. Attempted pt to participate in toe tap tap to 2 in step however pt unable to follow instruction/cues for this particular activity. Pt returned to w/c in same manner as prior and propelled back to room >162ft with supervision and verbal cues for avoiding objects on L. Once in room pt requesting to use bathroom for BM. Performed stand pivot with wall rail to toilet with maxA for LB clothing management (+void). Once completed noted that pt urinated on floor and soiled clothes. Performed Stedy transfer to bed and removed clothing, handed pt off to nsg to complete peri-care and clothing management.      Therapy Documentation Precautions:  Precautions Precautions: Fall Precaution Comments: Lt hemi + Lt hemianopsia at  baseline Restrictions Weight Bearing Restrictions: No General:   Vital Signs: Therapy Vitals Temp: 97.7 F (36.5 C) Pulse Rate: 71 Resp: 16 BP: 121/89 Patient Position (if appropriate): Sitting Oxygen Therapy SpO2: 98 % O2 Device: Room Air   Therapy/Group: Individual Therapy  Ruairi Stutsman  Kirra Verga, PTA  10/29/2018, 1:15 PM

## 2018-10-30 ENCOUNTER — Inpatient Hospital Stay (HOSPITAL_COMMUNITY): Payer: Medicare Other | Admitting: Speech Pathology

## 2018-10-30 ENCOUNTER — Inpatient Hospital Stay (HOSPITAL_COMMUNITY): Payer: Medicare Other | Admitting: Occupational Therapy

## 2018-10-30 ENCOUNTER — Inpatient Hospital Stay (HOSPITAL_COMMUNITY): Payer: Medicare Other | Admitting: Physical Therapy

## 2018-10-30 NOTE — Progress Notes (Signed)
Physical Therapy Session Note  Patient Details  Name: Patrick Franklin MRN: [id number] Date of Birth: January 08, 1971  Today's Date: 10/30/2018 PT Individual Time: 1120-1201 PT Individual Time Calculation (min): 41 min   Short Term Goals: Week 4:  PT Short Term Goal 1 (Week 4): STG=LTG due to ELOS  Skilled Therapeutic Interventions/Progress Updates: Pt presented in w/c agreeable to therapy. Pt indicated need for void. Performed STS with HW and minA and PTA assisted with urinal management (+void). Pt transported to day room and attempted to participate in Cybex Kinetron from w/c 80cm/sec for reciprocal activity. Pt initially required AA for LLE however was able to reciprocate action after several cycles and maintain for approx 10-12 cycles. Pt transported to rehab gym and performed round of horseshoes in standing. Pt noted to be more distracted this session and required frequent redirection to sustain task. Pt attempted ambulation however pt unable to sequence safety as appeared to be more internally distracted. Pt propelled back to room supervision with min cues to follow PTA as pt attempted to turn towards elevators. Pt remained in w/c at end of session with belt alarm on, call bell within reach and needs met.      Therapy Documentation Precautions:  Precautions Precautions: Fall Precaution Comments: Lt hemi + Lt hemianopsia at baseline Restrictions Weight Bearing Restrictions: No General: PT Amount of Missed Time (min): 19 Minutes PT Missed Treatment Reason: Other (Comment) Vital Signs: Therapy Vitals Temp: 97.9 F (36.6 C) Pulse Rate: 68 Resp: 18 BP: 116/85 Patient Position (if appropriate): Lying Oxygen Therapy SpO2: 97 % O2 Device: Room Air Pain: Pain Assessment Pain Score: 0-No pain Mobility:   Locomotion :    Trunk/Postural Assessment :    Balance:   Exercises:   Other Treatments:      Therapy/Group: Individual Therapy  Soundra Lampley 10/30/2018, 4:22 PM

## 2018-10-30 NOTE — Progress Notes (Signed)
Occupational Therapy Session Note  Patient Details  Name: Patrick Franklin MRN: [id number] Date of Birth: 1970-03-10  Today's Date: 10/30/2018 OT Individual Time: 0930-1015 OT Individual Time Calculation (min): 45 min    Short Term Goals: Week 1:  OT Short Term Goal 1 (Week 1): Pt will maintain sustained attention to 1 ADL task for 30 seconds with mod vcs OT Short Term Goal 1 - Progress (Week 1): Met OT Short Term Goal 2 (Week 1): Pt will sit EOB/EOM for 20 minutes during functional activity with no more than Min A for sitting balance OT Short Term Goal 2 - Progress (Week 1): Met Week 2:  OT Short Term Goal 1 (Week 2): STGs=LTGs due to ELOS Week 3:  OT Short Term Goal 1 (Week 3): STGs=LTGs secondary to upcoming discharge  Skilled Therapeutic Interventions/Progress Updates:    Upon entering the room, pt supine in bed and sleeping soundly. Pt very difficult to awaken from sleep and needing persistent cuing. LB clothing donned from bed level with pt bridging to pull over R hip. OT providing total A to don B shoes and L AFO. Supine >sit with mod A to EOB. Min A sit >stand from EOB and ambulating 20' with hemiwalker. Pt seated in wheelchair at sink for grooming tasks with set up A and min cuing for sequencing and attention. Pt remained in wheelchair at end of session with chair alarm belt donned for safety. Call bell and all needed items within reach.  Therapy Documentation Precautions:  Precautions Precautions: Fall Precaution Comments: Lt hemi + Lt hemianopsia at baseline Restrictions Weight Bearing Restrictions: No General: General PT Missed Treatment Reason: Other (Comment) Vital Signs: Therapy Vitals Temp: 97.9 F (36.6 C) Pulse Rate: 68 Resp: 18 BP: 116/85 Patient Position (if appropriate): Lying Oxygen Therapy SpO2: 97 % O2 Device: Room Air Pain: Pain Assessment Pain Score: 0-No pain ADL: ADL Eating: Not assessed Grooming: Moderate assistance(combing hair) Where  Assessed-Grooming: Chair Upper Body Bathing: Maximal assistance Where Assessed-Upper Body Bathing: Edge of bed Lower Body Bathing: Dependent(2 assist) Where Assessed-Lower Body Bathing: Edge of bed Upper Body Dressing: Maximal assistance Where Assessed-Upper Body Dressing: Edge of bed Lower Body Dressing: Dependent(2 assist) Where Assessed-Lower Body Dressing: Edge of bed Toileting: Dependent(using Stedy) Where Assessed-Toileting: Bedside Commode Toilet Transfer: Maximal assistance Toilet Transfer Method: Stand pivot Science writer: Engineer, technical sales Transfer: Not assessed   Therapy/Group: Individual Therapy  Gypsy Decant 10/30/2018, 5:34 PM

## 2018-10-30 NOTE — Progress Notes (Signed)
Speech Language Pathology Daily Session Note  Patient Details  Name: Patrick Franklin MRN: [id number] Date of Birth: [birth year]  Today's Date: 10/30/2018 SLP Individual Time: 4656-8127 SLP Individual Time Calculation (min): 44 min  Short Term Goals: Week 4: SLP Short Term Goal 1 (Week 4): Patient will attend to basic, familiar task for 5 minutes with Mod verbal cues for redirection. SLP Short Term Goal 2 (Week 4): Pt will recall basic biographical and/or daily information with Mod A verbal/visual cues. SLP Short Term Goal 3 (Week 4): Patient will complete basic, familiar tasks with Mod a verbal cues for functional problem solving.  Skilled Therapeutic Interventions: Pt was seen for skilled ST targeting cognitive goals. Pt was eating lunch with NT upon therapist's arrival. SLP provided Mod A verbal and visual cues for basic problem solving throughout meal, however strict environmental control and Max A verbal cues were required for sustained attention. Pt intermittently ceased masticating with POs in mouth, requiring Max A verbal cues for awareness of POs in mouth and need to chew. Max A question cues also required for initiating requests for help with tray set up. During a basic safety awareness task, pt identified general safety concerns in 5 out of 7 picture cards with overall Mod A verbal and visual cues. It was unclear whether pt knew answers in remaining 2 cards, or if he simply became disengaged in activity. Pt was left in wheelchair with call bell in lap. Continue per current plan of care.      Pain Pain Assessment Pain Score: 0-No pain  Therapy/Group: Individual Therapy  Arbutus Leas 10/30/2018, 1:47 PM

## 2018-10-30 NOTE — Progress Notes (Signed)
[place] PHYSICAL MEDICINE & REHABILITATION PROGRESS NOTE   Subjective/Complaints: No new issues , wants me to see him walk with therapy      ROS: Limited due to cognitive/behavioral   Objective:   No results found. No results for input(s): WBC, HGB, HCT, PLT in the last 72 hours. No results for input(s): NA, K, CL, CO2, GLUCOSE, BUN, CREATININE, CALCIUM in the last 72 hours.  Intake/Output Summary (Last 24 hours) at 10/30/2018 0821 Last data filed at 10/30/2018 0757 Gross per 24 hour  Intake 700 ml  Output 1150 ml  Net -450 ml     Physical Exam: Vital Signs Blood pressure 127/90, pulse 62, temperature 98.6 F (37 C), resp. rate 18, height 5\' 10"  (1.778 m), weight 87.2 kg, SpO2 97 %. Constitutional: No distress . Vital signs reviewed. Sitting up in manual w/c at bedside, food from breakfast still crumbs on face,  NAD HEENT: EOMI, oral membranes moist Neck: supple Cardiovascular: RRR without murmur. No JVD    Respiratory: CTA Bilaterally without wheezes or rales. Normal effort    GI: BS +, non-tender, non-distended  Skin: Warm and dry.  Intact. Psych: Limited to due to cognition- flat affect Musc: No edema in extremities.  No tenderness in extremities. 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 Functional deficits secondary to new posterior left MCA distribution infarct in the setting of prior right MCA infarct with chronic left hemiparesis which require 3+ hours per day of interdisciplinary therapy in a comprehensive inpatient rehab setting.  Physiatrist is providing close team supervision and 24 hour management of active medical problems listed  below.  Physiatrist and rehab team continue to assess barriers to discharge/monitor patient progress toward functional and medical goals  Care Tool:  Bathing    Body parts bathed by patient: Face, Right upper leg, Left upper leg, Chest, Front perineal area, Abdomen, Left arm   Body parts bathed by helper: Right arm, Buttocks, Right lower leg, Left lower leg Body parts n/a: Right lower leg, Left lower leg   Bathing assist Assist Level: Moderate Assistance - Patient 50 - 74%     Upper Body Dressing/Undressing Upper body dressing   What is the patient wearing?: Pull over shirt    Upper body assist Assist Level: Minimal Assistance - Patient > 75%    Lower Body Dressing/Undressing Lower body dressing      What is the patient wearing?: Pants, Underwear/pull up     Lower body assist Assist for lower body dressing: Total Assistance - Patient < 25%     Toileting Toileting    Toileting assist Assist for toileting: Moderate Assistance - Patient 50 - 74%     Transfers Chair/bed transfer  Transfers assist     Chair/bed transfer assist level: Minimal Assistance - Patient > 75%     Locomotion Ambulation   Ambulation assist      Assist level: Minimal Assistance - Patient > 75% Assistive device: Walker-hemi Max distance: 71ft   Walk 10 feet activity   Assist  Walk 10 feet activity did not occur: Safety/medical concerns  Assist level: Minimal Assistance - Patient > 75% Assistive device: Orthosis, Walker-hemi   Walk 50 feet activity   Assist  Walk 50 feet with 2 turns activity did not occur: Safety/medical concerns  Assist level: Minimal Assistance - Patient > 75% Assistive device: Walker-hemi, Orthosis    Walk 150 feet activity   Assist Walk 150 feet activity did not occur: Safety/medical concerns         Walk 10 feet on uneven surface  activity   Assist Walk 10 feet on uneven surfaces activity did not occur: Safety/medical concerns          Wheelchair     Assist Will patient use wheelchair at discharge?: Yes Type of Wheelchair: Manual Wheelchair activity did not occur: Safety/medical concerns  Wheelchair assist level: Supervision/Verbal cueing Max wheelchair distance: 174ft    Wheelchair 50 feet with 2 turns activity    Assist    Wheelchair 50 feet with 2 turns activity did not occur: Safety/medical concerns   Assist Level: Supervision/Verbal cueing   Wheelchair 150 feet activity     Assist  Wheelchair 150 feet activity did not occur: Safety/medical concerns   Assist Level: Supervision/Verbal cueing   Blood pressure 127/90, pulse 62, temperature 98.6 F (37 C), resp. rate 18, height 5\' 10"  (1.778 m), weight 87.2 kg, SpO2 97 %.  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 Patient has hx ofprior right MCA infarction as well as VP shunt for hydrocephalus  Continue CIR PT OT speech Would benefit from botox may need to do as OP   SNF pending 2. Antithrombotics: -DVT/anticoagulation:SCDs. Venous Dopplers negative -antiplatelet therapy: Aspirin 81 mg daily 3. Pain Management:Tylenol as needed.    Topamax for HA 4. Mood:Provide emotional support  Question of suicidal ideation on 9/24, psych consulted, recommended Cymbalta.  Per therapies, pt with inconsistent cognition and memory with possibility of manipulative behavior. requested Neuropsych consult. -antipsychotic agents: N/A 5. Neuropsych: This patientis notcapable of making decisions on hisown behalf. 6. Skin/Wound Care:Routine skin checks 7. Fluids/Electrolytes/Nutrition:Routine I/Os.    BMP within acceptable range on 9/25 8. Seizure disorder. Vimpat 100 mg twice daily, Depakote 1000 mg twice daily. EEG negative  No seizures since admission to rehab- 9/27, has very long latency of  response with poor attention.  Do not feel that these represent seizures 9. Post stroke dysphagia. Follow-up speech therapy. Diet has been advanced to regular as of 10/04/2018. 10. Hypertension.  Patient on metoprolol 25 mg twice daily prior to admission. Resume as needed Vitals:   10/29/18 1907 10/30/18 0441  BP: 126/87 127/90  Pulse: 80 62  Resp: 18 18  Temp: 97.7 F (36.5 C) 98.6 F (37 C)  SpO2: 99% 97%   Resumed metoprolol at 12.5mg  bid on 9/21- increase to 25mg  controlled 10/6  11. Hyperlipidemia. Lipitor 80 mg daily 12. Neurogenic bladder. generally continent. 13. Chronic thrombocytopenia.   Platelets 113 on 9/21   Continue to monitor 14.  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  Would be a good candidate for Botox.  If samples are available may be able to do prior to SNF otherwise will need to follow-up as outpatient  LOS: 25 days A FACE TO FACE EVALUATION WAS PERFORMED  Patrick Franklin 10/30/2018, 8:21 AM

## 2018-10-31 ENCOUNTER — Inpatient Hospital Stay (HOSPITAL_COMMUNITY): Payer: Medicare Other | Admitting: Physical Therapy

## 2018-10-31 ENCOUNTER — Inpatient Hospital Stay (HOSPITAL_COMMUNITY): Payer: Medicare Other | Admitting: Speech Pathology

## 2018-10-31 ENCOUNTER — Inpatient Hospital Stay (HOSPITAL_COMMUNITY): Payer: Medicare Other | Admitting: Occupational Therapy

## 2018-10-31 NOTE — Progress Notes (Signed)
Occupational Therapy Weekly Progress Note  Patient Details  Name: Patrick Franklin MRN: [id number] Date of Birth: [birth year]  Beginning of progress report period: October 23, 2018 End of progress report period: October 31, 2018  Today's Date: 10/31/2018 OT Individual Time: 3254-9826 OT Individual Time Calculation (min): 58 min    Patient has met 5 of 8 long term goals.  Short term goals not set due to upcoming discharge. Pt is making limited progress and his abilities tend to fluctuate based on behaviors and motivation. Pt has been performing functional transfer with min - mod A with use of hemiwalker. Pt needs max A for UB self care and total A for LB self care. Toileting ranges from mod - max A overall.   Patient continues to demonstrate the following deficits: muscle weakness, decreased cardiorespiratoy endurance, decreased coordination and decreased motor planning, field cut and hemianopsia, decreased initiation, decreased attention, decreased awareness, decreased problem solving, decreased safety awareness, decreased memory and delayed processing and decreased sitting balance, decreased standing balance, decreased postural control, hemiplegia and decreased balance strategies and therefore will continue to benefit from skilled OT intervention to enhance overall performance with BADL.  See Patient's Care Plan for progression toward long term goals.  Patient progressing toward long term goals..  Continue plan of care.  Skilled Therapeutic Interventions/Progress Updates:    Upon entering the room, pt sleeping soundly in bed with lunch tray in front of him. Multiple attempts made to awaken pt and he was agreeable to sitting EOB to complete meal. Pt seated on EOB with close supervision and set up A to open containers. Pt then requesting need for toileting. Pt standing with min A and use of hemiwalker for min A stand pivot transfer into wheelchair on the L side. OT assisted pt via wheelchair  into bathroom. Pt needing mod cuing for sequencing of stand pivot to commode with mod A for transfer. Pt urinating on floor and unable to have BM. Pt needing total A for hygiene and clothing management. Pt returning to wheelchair and seated at sink with set up A to brush teeth and wash hands. Pt remained in wheelchair with chair alarm belt donned and call bell within reach awaiting next session.   Therapy Documentation Precautions:  Precautions Precautions: Fall Precaution Comments: Lt hemi + Lt hemianopsia at baseline Restrictions Weight Bearing Restrictions: No    Pain: Pain Assessment Pain Scale: 0-10 Pain Score: 0-No pain ADL: ADL Eating: Not assessed Grooming: Moderate assistance(combing hair) Where Assessed-Grooming: Chair Upper Body Bathing: Maximal assistance Where Assessed-Upper Body Bathing: Edge of bed Lower Body Bathing: Dependent(2 assist) Where Assessed-Lower Body Bathing: Edge of bed Upper Body Dressing: Maximal assistance Where Assessed-Upper Body Dressing: Edge of bed Lower Body Dressing: Dependent(2 assist) Where Assessed-Lower Body Dressing: Edge of bed Toileting: Dependent(using Stedy) Where Assessed-Toileting: Bedside Commode Toilet Transfer: Maximal assistance Toilet Transfer Method: Stand pivot Science writer: Engineer, technical sales Transfer: Not assessed   Therapy/Group: Individual Therapy  Gypsy Decant 10/31/2018, 12:54 PM

## 2018-10-31 NOTE — Progress Notes (Signed)
[place] PHYSICAL MEDICINE & REHABILITATION PROGRESS NOTE   Subjective/Complaints: PT working with pt , asking for qd orders, pt stable at min A amb      ROS: Limited due to cognitive/behavioral   Objective:   No results found. No results for input(s): WBC, HGB, HCT, PLT in the last 72 hours. No results for input(s): NA, K, CL, CO2, GLUCOSE, BUN, CREATININE, CALCIUM in the last 72 hours.  Intake/Output Summary (Last 24 hours) at 10/31/2018 0843 Last data filed at 10/31/2018 0838 Gross per 24 hour  Intake 460 ml  Output 900 ml  Net -440 ml     Physical Exam: Vital Signs Blood pressure 137/87, pulse 61, temperature 98 F (36.7 C), temperature source Oral, resp. rate 15, height 5' 10"  (1.778 m), weight 88.2 kg, SpO2 99 %. Constitutional: No distress . Vital signs reviewed. Sitting up in manual w/c at bedside, food from breakfast still crumbs on face,  NAD HEENT: EOMI, oral membranes moist Neck: supple Cardiovascular: RRR without murmur. No JVD    Respiratory: CTA Bilaterally without wheezes or rales. Normal effort    GI: BS +, non-tender, non-distended  Skin: Warm and dry.  Intact. Psych: Limited to due to cognition- flat affect Musc: No edema in extremities.  No tenderness in extremities. 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 Functional deficits secondary to new posterior left MCA distribution infarct in the setting of prior right MCA infarct with chronic left hemiparesis which require 3+ hours per day of interdisciplinary therapy in a comprehensive inpatient rehab setting.  Physiatrist is providing close team supervision and 24 hour management of  active medical problems listed below.  Physiatrist and rehab team continue to assess barriers to discharge/monitor patient progress toward functional and medical goals  Care Tool:  Bathing    Body parts bathed by patient: Face, Right upper leg, Left upper leg, Chest, Front perineal area, Abdomen, Left arm   Body parts bathed by helper: Right arm, Buttocks, Right lower leg, Left lower leg Body parts n/a: Right lower leg, Left lower leg   Bathing assist Assist Level: Moderate Assistance - Patient 50 - 74%     Upper Body Dressing/Undressing Upper body dressing   What is the patient wearing?: Pull over shirt    Upper body assist Assist Level: Minimal Assistance - Patient > 75%    Lower Body Dressing/Undressing Lower body dressing      What is the patient wearing?: Pants, Underwear/pull up     Lower body assist Assist for lower body dressing: Total Assistance - Patient < 25%     Toileting Toileting    Toileting assist Assist for toileting: Moderate Assistance - Patient 50 - 74%     Transfers Chair/bed transfer  Transfers assist     Chair/bed transfer assist level: Minimal Assistance - Patient > 75%     Locomotion Ambulation   Ambulation assist      Assist level: Minimal Assistance - Patient > 75% Assistive device: Walker-hemi Max distance: 20'   Walk 10 feet activity   Assist  Walk 10 feet activity did not occur: Safety/medical concerns  Assist level: Minimal Assistance - Patient > 75% Assistive device: Orthosis, Walker-hemi   Walk  50 feet activity   Assist Walk 50 feet with 2 turns activity did not occur: Safety/medical concerns  Assist level: Minimal Assistance - Patient > 75% Assistive device: Walker-hemi, Orthosis    Walk 150 feet activity   Assist Walk 150 feet activity did not occur: Safety/medical concerns         Walk 10 feet on uneven surface  activity   Assist Walk 10 feet on uneven surfaces activity did not occur:  Safety/medical concerns         Wheelchair     Assist Will patient use wheelchair at discharge?: Yes Type of Wheelchair: Manual Wheelchair activity did not occur: Safety/medical concerns  Wheelchair assist level: Supervision/Verbal cueing Max wheelchair distance: 118f    Wheelchair 50 feet with 2 turns activity    Assist    Wheelchair 50 feet with 2 turns activity did not occur: Safety/medical concerns   Assist Level: Supervision/Verbal cueing   Wheelchair 150 feet activity     Assist  Wheelchair 150 feet activity did not occur: Safety/medical concerns   Assist Level: Supervision/Verbal cueing   Blood pressure 137/87, pulse 61, temperature 98 F (36.7 C), temperature source Oral, resp. rate 15, height 5' 10"  (1.778 m), weight 88.2 kg, SpO2 99 %.  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 Patient has hx ofprior right MCA infarction as well as VP shunt for hydrocephalus  Team conference today please see physician documentation under team conference tab, met with team face-to-face to discuss problems,progress, and goals. Formulized individual treatment plan based on medical history, underlying problem and comorbidities.  SNF pending ok for qd therapy  2. Antithrombotics: -DVT/anticoagulation:SCDs. Venous Dopplers negative -antiplatelet therapy: Aspirin 81 mg daily 3. Pain Management:Tylenol as needed.    Topamax for HA 4. Mood:Provide emotional support  Question of suicidal ideation on 9/24, psych consulted, recommended Cymbalta.  Per therapies, pt with inconsistent cognition and memory with possibility of manipulative behavior. requested Neuropsych consult. -antipsychotic agents: N/A 5. Neuropsych: This patientis notcapable of making decisions on hisown behalf. 6. Skin/Wound Care:Routine skin  checks 7. Fluids/Electrolytes/Nutrition:Routine I/Os.    BMP within acceptable range on 9/25 8. Seizure disorder. Vimpat 100 mg twice daily, Depakote 1000 mg twice daily. EEG negative  No seizures since admission to rehab- 9/27, has very long latency of response with poor attention.  Do not feel that these represent seizures 9. Post stroke dysphagia. Follow-up speech therapy. Diet has been advanced to regular as of 10/04/2018. 10. Hypertension.  Patient on metoprolol 25 mg twice daily prior to admission. Resume as needed Vitals:   10/30/18 1946 10/31/18 0345  BP: 124/89 137/87  Pulse: 75 61  Resp: 16 15  Temp: 98 F (36.7 C) 98 F (36.7 C)  SpO2: 96% 99%   Resumed metoprolol at 12.539mbid on 9/21- increase to 2543montrolled 10/7  11. Hyperlipidemia. Lipitor 80 mg daily 12. Neurogenic bladder. generally continent. 13. Chronic thrombocytopenia.   Platelets 113 on 9/21   Continue to monitor 14.  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  Would be a good candidate for Botox.  If samples are available may be able to do prior to SNF otherwise will need to follow-up as outpatient  LOS: 26 days A FACE TO FACTerralKirsteins 10/31/2018, 8:43 AM

## 2018-10-31 NOTE — Progress Notes (Signed)
Physical Therapy Session Note  Patient Details  Name: Patrick Franklin MRN: [id number] Date of Birth: 06/01/1970  Today's Date: 10/31/2018 PT Individual Time: 0805-0900 PT Individual Time Calculation (min): 55 min   Short Term Goals: Week 4:  PT Short Term Goal 1 (Week 4): STG=LTG due to ELOS  Skilled Therapeutic Interventions/Progress Updates:  Pt in bed upon arrival for therapy. Pt agreeable to treatment. Pt transferred supine>sitting EOB with CGA. SPT donned pt's L AFO and shoes. Pt completed sit>stand with minA and remained standing while SPT pulled up pt's brief and pants. Pt performed stand pivot transfer with minA, L AFO, and hemiwalker. Pt completed obstacle coursex4 consisting of stepping up and over hockey stick, going around a chair, and navigating 2 cones with minA, L AFO, and hemiwalker. Pt required max cueing to navigate obstacle course safely and appropriately. Pt stated "I'm dizzy, I can't see anything" during 2nd trial of obstacle course, pt stood standing rest break, and completed it. Pt self propelled wheelchair from therapy gym>day room about 100 feet with minA cueing. Pt unaware of L side. Pt completed sit>stand with minA, L AFO, and hemiwalker and was able to stand and correctly identify colored tiles when asked. Pt required min-mod cueing for directions and maintaining midline orientation. Pt sat back down in wheelchair and was transported back to room, set up with remaining breakfast tray, safety belt in place, and call bell within reach. All needs met at this time.     Therapy Documentation Precautions:  Precautions Precautions: Fall Precaution Comments: Lt hemi + Lt hemianopsia at baseline Restrictions Weight Bearing Restrictions: No Therapy Vitals Temp: 98.1 F (36.7 C) Temp Source: Oral Pulse Rate: 66 Resp: 16 BP: 126/82 Patient Position (if appropriate): Sitting Oxygen Therapy SpO2: 99 % O2 Device: Room Air Pain: denies pain  Pain Assessment Pain  Score: 0-No pain Therapy/Group: Individual Therapy  Olena Leatherwood, SPT  10/31/2018, 3:14 PM

## 2018-10-31 NOTE — Progress Notes (Signed)
Speech Language Pathology Daily Session Note  Patient Details  Name: Patrick Franklin MRN: [id number] Date of Birth: October 23, 1970  Today's Date: 10/31/2018 SLP Individual Time: 3810-1751 SLP Individual Time Calculation (min): 25 min  Short Term Goals: Week 4: SLP Short Term Goal 1 (Week 4): Patient will attend to basic, familiar task for 5 minutes with Mod verbal cues for redirection. SLP Short Term Goal 2 (Week 4): Pt will recall basic biographical and/or daily information with Mod A verbal/visual cues. SLP Short Term Goal 3 (Week 4): Patient will complete basic, familiar tasks with Mod a verbal cues for functional problem solving.  Skilled Therapeutic Interventions: Pt was seen for skilled ST targeting cognitive goals. Pt required increased levels of assistance to complete structured tasks in comparison to previously targeted sessions. It was unclear wether this was due to pt's disinterest in activity/limited participation, or due to true problem solving and emergent awareness deficits. He often refused to speak and shook his head when therapist asked him questions to assist him in arriving at correct answers. During a familiar basic card game (WAR), pt identified the higher value card (out of 2) with 60% accuracy, and sorted cards by suit (field of 4) with ~75% accuracy, requiring Max-Total A for error awareness and correction throughout both tasks. In functional conversation, pt verbally recalled activities of 1 out of 2 other therapy sessions earlier in day (PT). Pt was left sitting upright in wheelchair with call bell in lap. Continue per current plan of care.      Pain Pain Assessment Pain Score: 0-No pain  Therapy/Group: Individual Therapy  Arbutus Leas 10/31/2018, 3:02 PM

## 2018-10-31 NOTE — Patient Care Conference (Signed)
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    Blood pressure increase diastolic    Seizures (HCC)    Vascular headache    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   Cerebral embolism with cerebral infarction 10/03/2018   Neurological deficit present 10/01/2018   Thrombocytopenia ([place]) 10/01/2018   Neurogenic bladder 10/01/2018   Medicare annual wellness visit, subsequent 07/23/2018   Spastic hemiplegia affecting nondominant side ([place]) 03/26/2018   Impaired decision making 08/30/2017   Aorto-iliac atherosclerosis ([place]) 08/29/2017   History of renal stone 08/22/2017   Seizure disorder ([place]) 08/09/2017   Hemiparesis affecting left side as late effect of stroke ([place]) 08/09/2017   Essential hypertension 05/15/2017   Urinary retention 05/15/2017   Hyperlipidemia 05/15/2017   Hx of ischemic right MCA stroke 05/15/2017   MDD (major depressive disorder) 05/15/2017    Expected Discharge Date: Expected Discharge Date: (NHP)  Team Members Present: Physician leading conference: Dr. Courtney Heys Social Worker Present: Ovidio Kin, LCSW Nurse Present: Other (comment)(Michelle-RN) PT Present: Barrie Folk, PT;Rosita Dechalus, PTA OT Present: Darleen Crocker, OT SLP Present: Charolett Bumpers, SLP PPS Coordinator present : Gunnar Fusi, SLP     Current Status/Progress Goal Weekly Team Focus  Bowel/Bladder   Con of b/b during the day,  has trouble trouble comunicating needs at night incon at night b/b  con of b/b at all times  timed toileting   Swallow/Nutrition/ Hydration             ADL's   Mod A bathing, Min A UB dressing, Max-total A LB dressing, Min-Mod A stand pivot BSC transfers using hemi walker, Mod A toileting. Behaviors continue to limit progress and participation  Mod A overall  NMR, functional trasnfers, ADL retraining, cognition, balance   Mobility   CGA bed mobility, minA STS, minA stand pivot, min-modA gait pending awareness and participationi levels  min A transfers, mod A gait  gait, transfers, d/c   Communication   Supervision A expressing wants/needs - goal met  Supervision A  n/a - goal met   Safety/Cognition/ Behavioral Observations  Min-Max depending on willingness to participate and altertness  Mod-Max A basic tasks  sustained attention, basic functional problem solving, functional daily recall   Pain   c/o head pain  Pain less then a 3 controlled with medication  contiuned montoring qshift   Skin   no skin issues at this time  To remain free of breakdown and infection.         *See Care Plan and progress notes for long and short-term goals.     Barriers to Discharge  Current Status/Progress Possible Resolutions Date Resolved   Nursing                  PT  OT                  SLP                SW                Discharge Planning/Teaching Needs:  Looking for NH bed, insurance issues and pt is young. Will expand bed search      Team Discussion:  Continuing to make slow progress-team has QD him. Looking for NH bed. MD to botox his left. Labs good. Participates in therapies, but at times does not try as hard.   Revisions to Treatment Plan:  Looking for NH bed    Medical Summary Current Status: Continues at a min assist level for mobility.  Left lower extremity tone has not been interfering with his mobility or ADLs. Weekly Focus/Goal: Maintain current status,  at plateau level  Barriers to Discharge: Other (comments)  Barriers to Discharge Comments: Awaiting nursing home bed Possible Resolutions to Barriers: May reduce therapies to daily   Continued Need for Acute Rehabilitation Level of Care: The patient requires daily medical management by a physician with specialized training in physical medicine and rehabilitation for the following reasons: Direction of a multidisciplinary physical rehabilitation program to maximize functional independence : Yes Medical management of patient stability for increased activity during participation in an intensive rehabilitation regime.: Yes Analysis of laboratory values and/or radiology reports with any subsequent need for medication adjustment and/or medical intervention. : Yes   I attest that I was present, lead the team conference, and concur with the assessment and plan of the team. Teleconference held due to COVID 19   Maxwell Lemen, Gardiner Rhyme 10/31/2018, 2:19 PM

## 2018-11-01 ENCOUNTER — Inpatient Hospital Stay (HOSPITAL_COMMUNITY): Payer: Medicare Other | Admitting: Speech Pathology

## 2018-11-01 ENCOUNTER — Inpatient Hospital Stay (HOSPITAL_COMMUNITY): Payer: Medicare Other | Admitting: Physical Therapy

## 2018-11-01 ENCOUNTER — Inpatient Hospital Stay (HOSPITAL_COMMUNITY): Payer: Medicare Other | Admitting: Occupational Therapy

## 2018-11-01 NOTE — Progress Notes (Signed)
Physical Therapy Weekly Progress Note  Patient Details  Name: CHRISTROPHER GINTZ MRN: [id number] Date of Birth: 1970/11/23  Beginning of progress report period: October 25, 2018 End of progress report period: November 01, 2018  Today's Date: 11/01/2018 PT Individual Time: 0905-1000 PT Individual Time Calculation (min): 55 min   Pt continues to make progress towards LTGs d/t estimated length of stay. Pt is awaiting SNF acceptance at this time. Patient continues to demonstrate the following deficits muscle weakness, muscle joint tightness and muscle paralysis, impaired timing and sequencing, abnormal tone, unbalanced muscle activation, motor apraxia, ataxia, decreased coordination and decreased motor planning, decreased visual acuity, field cut and hemianopsia, decreased midline orientation, decreased attention to left, left side neglect and decreased motor planning, decreased initiation, decreased attention, decreased awareness, decreased problem solving, decreased safety awareness, decreased memory and delayed processing and decreased standing balance, decreased postural control, hemiplegia, decreased balance strategies and difficulty maintaining precautions and therefore will continue to benefit from skilled PT intervention to increase functional independence with mobility.  Patient progressing toward long term goals.. Continue plan of care.  PT Short Term Goals STGs = LTGs d/t ELOS, awaiting SNF placement  Skilled Therapeutic Interventions/Progress Updates:  SPT initiated PT treatment intervention during today's session. Pt received supine in bed agreeable to therapy. Pt trasnferred supine>sitting EOB with supervision. SPT donned pt's shoes and L AFO. Pt performed stand pivot transfer from EOB>wheelchair with minA and verbal cueing for sequencing. Pt transported in wheelchair to hallway. Pt performed sit>stand and ambulated 50 feetx2 with minA and verbal cueing for sequencing. Pt required rest  break d/t fatigue and poor motor planning. Pt transported from hallway to day room in wheelchair. Pt completed obstacle coursex4 including navigating cones and stepping up and over hockey stick. Pt required verbal cueing for motor planning and coordination each time during the obstacle course. Then, pt took rest break in wheelchair. Pt completed sit>stand with hemiwalker, L AFO, and minA and hit the ball back and forth with PT. Pt then took a rest break and completed ball toss with PT. Pt took rest break in wheelchair d/t fatigue. Pt ambulated 50 feet with hemiwalker, L AFO requiring minA and verbal cueing for sequencing and directions of task. Pt returned to room in wheelchair, stating he needs to have a BM. Pt performed stand pivot transfer to the toilet. +void and BM on toilet. Pt unable to complete pericare independently. Pt performed stand pivot transfer back to wheelchair. Pt left sitting upright in wheelchair, safety belt in place, and call bell within reach. All needs met at this time. Pt demonstrated poor motor planning throughout most of today's session.     Therapy Documentation Precautions:  Precautions Precautions: Fall Precaution Comments: Lt hemi + Lt hemianopsia at baseline Restrictions Weight Bearing Restrictions: No Pain: denies pain  Vision/Perception: wears glasses all of the time  Mobility: Bed Mobility Bed Mobility: Rolling Right;Rolling Left;Supine to Sit;Sit to Supine Rolling Right: Supervision/verbal cueing Rolling Left: Supervision/Verbal cueing Supine to Sit: Contact Guard/Touching assist;Minimal Assistance - Patient > 75% Sit to Supine: Contact Guard/Touching assist;Minimal Assistance - Patient > 75% Transfers Transfers: Sit to Stand;Stand to Sit;Stand Pivot Transfers Sit to Stand: Minimal Assistance - Patient > 75% Stand to Sit: Minimal Assistance - Patient > 75% Stand Pivot Transfers: Minimal Assistance - Patient > 75% Stand Pivot Transfer Details: Tactile cues  for initiation;Tactile cues for weight shifting;Tactile cues for sequencing;Verbal cues for technique;Verbal cues for gait pattern;Verbal cues for precautions/safety;Verbal cues for sequencing;Verbal cues for safe use  of DME/AE;Manual facilitation for weight shifting Stand Pivot Transfer Details (indicate cue type and reason): minA with hemiwalker placed on R side for safety and coordination Transfer (Assistive device): Hemi-walker Locomotion : Gait Ambulation: Yes Gait Assistance: Minimal Assistance - Patient > 75% Gait Distance (Feet): 50 Feet Assistive device: Hemi-walker Gait Assistance Details: Tactile cues for weight shifting;Verbal cues for technique;Manual facilitation for weight shifting;Verbal cues for gait pattern;Verbal cues for safe use of DME/AE;Verbal cues for precautions/safety;Tactile cues for posture Gait Assistance Details: minA with hemiwalker on R for safety Gait Gait: Yes Gait Pattern: Impaired Gait Pattern: Step-to pattern;Decreased step length - left;Decreased stance time - left;Decreased weight shift to right Gait velocity: decreased  Trunk/Postural Assessment : Cervical Assessment Cervical Assessment: Within Functional Limits Thoracic Assessment Thoracic Assessment: Within Functional Limits Lumbar Assessment Lumbar Assessment: Within Functional Limits Postural Control Postural Control: Deficits on evaluation Trunk Control: decreased control of trunk when standing and ambulating  Balance: Balance Balance Assessed: Yes Static Sitting Balance Static Sitting - Balance Support: Feet supported Static Sitting - Level of Assistance: 5: Stand by assistance Static Standing Balance Static Standing - Balance Support: During functional activity Static Standing - Level of Assistance: 4: Min assist Dynamic Standing Balance Dynamic Standing - Balance Support: During functional activity Dynamic Standing - Level of Assistance: 4: Min assist Dynamic Standing - Balance  Activities: Ball toss   Therapy/Group: Individual Therapy  Olena Leatherwood, SPT 11/01/2018, 12:30 PM

## 2018-11-01 NOTE — Progress Notes (Signed)
Speech Language Pathology Daily Session Note  Patient Details  Name: Patrick Franklin MRN: [id number] Date of Birth: 11/13/1970  Today's Date: 11/01/2018 SLP Individual Time: 1330-1415 SLP Individual Time Calculation (min): 45 min  Short Term Goals: Week 4: SLP Short Term Goal 1 (Week 4): Patient will attend to basic, familiar task for 5 minutes with Mod verbal cues for redirection. SLP Short Term Goal 2 (Week 4): Pt will recall basic biographical and/or daily information with Mod A verbal/visual cues. SLP Short Term Goal 3 (Week 4): Patient will complete basic, familiar tasks with Mod a verbal cues for functional problem solving.  Skilled Therapeutic Interventions: Pt was seen for skilled ST targeting cognitive goals. Upon entrance to pt's room, he spontaneously stated this SLP's name and therapy discipline for first time since his admission. Provided Mod A verbal cues to use external aid posted in room he also verbally recalled name of his MD. Pt unable to recall any details of therapy sessions earlier in day, despite Max A question cues. SLP facilitated session with fluctuating Mod-Max A for basic problem solving and error awareness during a basic PEG board design task. Pt was unable to perform task by copying designs in a picture, however when SLP provided a model/concrete example of design target, pt could copy it using PEG pieces with ~75% accuracy. He demonstrated improved sustained attention (~5-6 minute intervals with Min-Mod verbal cues for redirection) in comparison to previously targeted sessions. Pt was left in bed with alarm set and all needs within reach. Continue per current plan of care.      Pain Pain Assessment Pain Score: 0-No pain  Therapy/Group: Individual Therapy  Arbutus Leas 11/01/2018, 2:08 PM

## 2018-11-01 NOTE — Progress Notes (Signed)
Social Work Patient ID: Patrick Franklin, male   DOB: [birth year], 48 y.o.   MRN: [id number]  Spoke with sister to inform needed to expand bed search and still awaiting bed offer. She understands and will look at all of [place] and [place] area. Pt is aware of this and not happy about it and not talking to his sister due to this. Aware of team conference update.

## 2018-11-01 NOTE — Progress Notes (Signed)
Occupational Therapy Session Note  Patient Details  Name: Patrick Franklin MRN: [id number] Date of Birth: 09-28-1970  Today's Date: 11/01/2018 OT Individual Time: 2585-2778 OT Individual Time Calculation (min): 10 min  and Today's Date: 11/01/2018 OT Missed Time: 20 Minutes Missed Time Reason: Patient fatigue   Short Term Goals: Week 4:  OT Short Term Goal 1 (Week 4): STGs =LTGs secondary to upcoming discharge  Skilled Therapeutic Interventions/Progress Updates:    Upon entering the room, pt supine in bed and sleeping soundly. Pt would not open eyes and briefly mumbled when asked questions. Pt unable to fully awaken and actively engage in treatment session. Call bell within reach and bed alarm activated.   Therapy Documentation Precautions:  Precautions Precautions: Fall Precaution Comments: Lt hemi + Lt hemianopsia at baseline Restrictions Weight Bearing Restrictions: No General: General OT Amount of Missed Time: 20 Minutes Vital Signs: Therapy Vitals Temp: 98 F (36.7 C) Temp Source: Oral Pulse Rate: 78 Resp: 18 BP: 117/86 Patient Position (if appropriate): Lying Oxygen Therapy SpO2: 99 % O2 Device: Room Air Pain: Pain Assessment Pain Score: 0-No pain ADL: ADL Eating: Not assessed Grooming: Moderate assistance(combing hair) Where Assessed-Grooming: Chair Upper Body Bathing: Maximal assistance Where Assessed-Upper Body Bathing: Edge of bed Lower Body Bathing: Dependent(2 assist) Where Assessed-Lower Body Bathing: Edge of bed Upper Body Dressing: Maximal assistance Where Assessed-Upper Body Dressing: Edge of bed Lower Body Dressing: Dependent(2 assist) Where Assessed-Lower Body Dressing: Edge of bed Toileting: Dependent(using Stedy) Where Assessed-Toileting: Bedside Commode Toilet Transfer: Maximal assistance Toilet Transfer Method: Stand pivot Science writer: Engineer, technical sales Transfer: Not assessed   Therapy/Group: Individual  Therapy  Gypsy Decant 11/01/2018, 4:43 PM

## 2018-11-02 ENCOUNTER — Inpatient Hospital Stay (HOSPITAL_COMMUNITY): Payer: Medicare Other | Admitting: Speech Pathology

## 2018-11-02 ENCOUNTER — Inpatient Hospital Stay (HOSPITAL_COMMUNITY): Payer: Medicare Other | Admitting: Physical Therapy

## 2018-11-02 ENCOUNTER — Inpatient Hospital Stay (HOSPITAL_COMMUNITY): Payer: Medicare Other | Admitting: Occupational Therapy

## 2018-11-02 NOTE — Progress Notes (Signed)
Speech Language Pathology Discharge Summary  Patient Details  Name: Patrick Franklin MRN: [id number] Date of Birth: 05/01/1970  Today's Date: 11/02/2018 SLP Individual Time: 1430-1455 SLP Individual Time Calculation (min): 25 min   Skilled Therapeutic Interventions:  Pt was seen for skilled ST targeting cognitive goals. SLP facilitated session with overall Mod A cues for initiation and sustained attention during a familiar color block pattern board task. Pt matched colored blocks to associated colors in a pattern containing 6 colors with 95% accuracy. Verbal cues were provided in attempt to assist pt in correcting mistake, however pt shut his eyes and would not continue until a new pattern was presented. He demonstrated overall improved sustained attention in comparison to previous sessions, as he attended to this familiar and known to be preferred task for one ~10 minute interval, followed by 2-5 minute intervals. Pt was left sitting in wheelchair with seatbelt alarm engaged and all needs met to his satisfaction. Continue per current plan of care.     Patient has met 5 of 5 long term goals.  Patient to discharge at [facility] Max;Mod level.  Reasons goals not met: n/a   Clinical Impression/Discharge Summary:   Pt made inconsistent functional gains and met 5 out of 5 long term goals this admission. Overall minimal progress was impacted by behaviors and lethargy that limited pt's participation, as well as some baseline deficits that were severely exacerbated in the setting of pt's most recent stroke. Pt currently requires Mod-Max assist for basic cognitive tasks and will require 24/7 supervision. Pt has demonstrated improved sustained attention, basic problem solving, expression of basic wants and needs, and ability to follow simple 1-step directions. However, given severe cognitive deficits still present, recommend pt continue to receive skilled ST services at SNF upon discharge. Pt and family  education is complete at this time.  Care Partner:  Caregiver Able to Provide Assistance: No     Recommendation:  Skilled Nursing facility;24 hour supervision/assistance  Rationale for SLP Follow Up: Maximize cognitive function and independence;Reduce caregiver burden   Equipment: none   Reasons for discharge: Discharged from hospital   Patient/Family Agrees with Progress Made and Goals Achieved: (pt unable and no family available)    Little Ishikawa 11/02/2018, 2:59 PM

## 2018-11-02 NOTE — Plan of Care (Signed)
°  Problem: Consults °Goal: RH STROKE PATIENT EDUCATION °Description: See Patient Education module for education specifics  °Outcome: Progressing °  °Problem: RH BLADDER ELIMINATION °Goal: RH STG MANAGE BLADDER WITH ASSISTANCE °Description: STG Manage Bladder With min Assistance °Outcome: Progressing °  °Problem: RH SAFETY °Goal: RH STG ADHERE TO SAFETY PRECAUTIONS W/ASSISTANCE/DEVICE °Description: STG Adhere to Safety Precautions With mod/max Assistance/Device. °Outcome: Progressing °  °Problem: RH COGNITION-NURSING °Goal: RH STG USES MEMORY AIDS/STRATEGIES W/ASSIST TO PROBLEM SOLVE °Description: STG Uses Memory Aids/Strategies With mod Assistance to Problem Solve. °Outcome: Progressing °  °Problem: RH PAIN MANAGEMENT °Goal: RH STG PAIN MANAGED AT OR BELOW [name] PAIN GOAL °Description: < 4 °Outcome: Progressing °  °Problem: RH KNOWLEDGE DEFICIT °Goal: RH STG INCREASE KNOWLEDGE OF HYPERTENSION °Description: Patient/caregiver will verbalize understanding of HTN including diet, exercise, medications, monitoring, and follow up care with mod assist. °Outcome: Progressing °Goal: RH STG INCREASE KNOWLEGDE OF HYPERLIPIDEMIA °Description: Patient/caregiver will verbalize understanding of HLD including diet, exercise, medications, monitoring, and follow up care with mod assist. °Outcome: Progressing °Goal: RH STG INCREASE KNOWLEDGE OF STROKE PROPHYLAXIS °Description: Patient/caregiver will verbalize understanding of stroke prophylaxis including diet, exercise, medications, monitoring, and follow up care with mod assist. °Outcome: Progressing °  °

## 2018-11-02 NOTE — Progress Notes (Addendum)
Social Work Patient ID: Patrick Franklin, male   DOB: 1970-02-26, 48 y.o.   MRN: [id number] bed offers via [facility] and [facility]. [place] [facility]. Will have a bed for Monday. Work on transfer Monday. Sister to complete paperwork since she is his POA. Made pt aware he just closed his eyes while this worker was talking.

## 2018-11-02 NOTE — Progress Notes (Signed)
Occupational Therapy Session Note  Patient Details  Name: Patrick Franklin MRN: [id number] Date of Birth: 1970/11/01  Today's Date: 11/02/2018 OT Individual Time: 0930-1000 OT Individual Time Calculation (min): 30 min     Skilled Therapeutic Interventions/Progress Updates:    Patient seated in w/c, alert, denies pain.  Unable to answer questions directly - tends to answer questions with a question....  Sit to stand at table top with min A - min a to maintain - able to achieve WB through left UE on flat hand in standing position, added weight shift and puzzle activity while in stance.  Standing tolerated 2 x 5-6 minutes.  Seated posture and trunk mobility activities with max A, max cues to attend to the left.  He remained seated in w/c at close of session, seat belt alarm set and call bell in reach.   Therapy Documentation Precautions:  Precautions Precautions: Fall Precaution Comments: Lt hemi + Lt hemianopsia at baseline Restrictions Weight Bearing Restrictions: No General:   Vital Signs:   Pain: Pain Assessment Pain Scale: 0-10 Pain Score: 0-No pain  Therapy/Group: Individual Therapy  Carlos Levering 11/02/2018, 12:17 PM

## 2018-11-02 NOTE — Progress Notes (Signed)
Physical Therapy Session Note  Patient Details  Name: Patrick Franklin MRN: [id number] Date of Birth: September 13, 1970  Today's Date: 11/02/2018 PT Individual Time: 0800-0835 PT Individual Time Calculation (min): 35 min   Short Term Goals:   Week 5:  PT Short Term Goal 1 (Week 5): STG=LTG due to ELOS  Skilled Therapeutic Interventions/Progress Updates: Pt received supine in bed upon arrival and agreeable to treatment. Pt transferred supine>sitting EOB with supervision. SPT donned pt's L AFO and shoes. Pt performed stand pivot transfer with L AFO, hemiwalker, and minA from sitting EOB>wheelchair. Pt required verbal cueing for sequencing of hemiwalker, L foot, R foot. Pt transported in wheelchair to therapy gym. Pt completed obstacle coursex2 including navigating cones, stepping up and over hockey stick, and avoiding parallel bars and rolling stool along pathway with L AFO, hemiwalker, and minA and verbal cueing for motor planning and coordination during task of obstacle coursex2. Pt was distracted during task especially on 2nd trial. Pt took seated rest break in the wheelchair d/t fatigue. Pt transported back to room in wheelchair. Pt left sitting upright in chair, safety belt in place, and call bell within reach. [name] sister present in room putting away his laundry. All needs met at this time.      Therapy Documentation Precautions:  Precautions Precautions: Fall Precaution Comments: Lt hemi + Lt hemianopsia at baseline Restrictions Weight Bearing Restrictions: No   Pain: denies pain    Therapy/Group: Individual Therapy  Olena Leatherwood, SPT 11/02/2018, 10:54 AM

## 2018-11-03 LAB — NOVEL CORONAVIRUS, NAA (HOSP ORDER, SEND-OUT TO REF LAB; TAT 18-24 HRS): SARS-CoV-2, NAA: NOT DETECTED

## 2018-11-03 NOTE — Plan of Care (Signed)
°  Problem: Consults °Goal: RH STROKE PATIENT EDUCATION °Description: See Patient Education module for education specifics  °Outcome: Progressing °  °Problem: RH BLADDER ELIMINATION °Goal: RH STG MANAGE BLADDER WITH ASSISTANCE °Description: STG Manage Bladder With min Assistance °Outcome: Progressing °  °Problem: RH SAFETY °Goal: RH STG ADHERE TO SAFETY PRECAUTIONS W/ASSISTANCE/DEVICE °Description: STG Adhere to Safety Precautions With mod/max Assistance/Device. °Outcome: Progressing °  °Problem: RH COGNITION-NURSING °Goal: RH STG USES MEMORY AIDS/STRATEGIES W/ASSIST TO PROBLEM SOLVE °Description: STG Uses Memory Aids/Strategies With mod Assistance to Problem Solve. °Outcome: Progressing °  °Problem: RH PAIN MANAGEMENT °Goal: RH STG PAIN MANAGED AT OR BELOW [name] PAIN GOAL °Description: < 4 °Outcome: Progressing °  °Problem: RH KNOWLEDGE DEFICIT °Goal: RH STG INCREASE KNOWLEDGE OF HYPERTENSION °Description: Patient/caregiver will verbalize understanding of HTN including diet, exercise, medications, monitoring, and follow up care with mod assist. °Outcome: Progressing °Goal: RH STG INCREASE KNOWLEGDE OF HYPERLIPIDEMIA °Description: Patient/caregiver will verbalize understanding of HLD including diet, exercise, medications, monitoring, and follow up care with mod assist. °Outcome: Progressing °Goal: RH STG INCREASE KNOWLEDGE OF STROKE PROPHYLAXIS °Description: Patient/caregiver will verbalize understanding of stroke prophylaxis including diet, exercise, medications, monitoring, and follow up care with mod assist. °Outcome: Progressing °  °

## 2018-11-04 ENCOUNTER — Inpatient Hospital Stay (HOSPITAL_COMMUNITY): Payer: Medicare Other | Admitting: Physical Therapy

## 2018-11-04 ENCOUNTER — Inpatient Hospital Stay (HOSPITAL_COMMUNITY): Payer: Medicare Other

## 2018-11-04 NOTE — Progress Notes (Signed)
Occupational Therapy Session Note  Patient Details  Name: Patrick Franklin MRN: [id number] Date of Birth: [birth year]  Today's Date: 11/04/2018 OT Individual Time: 4585-9292 OT Individual Time Calculation (min): 40 min    Short Term Goals: Week 4:  OT Short Term Goal 1 (Week 4): STGs =LTGs secondary to upcoming discharge  Skilled Therapeutic Interventions/Progress Updates:    Pt recevied sleeping, easily awoken to vc. Pt with no verbal or non-verbal indications of pain throughout session. Pt completed bed mobility to EOB with mod A. Pt with poor sitting balance and spatial awareness, requiring R UE support and mod-max cueing for static sitting balance. Pt completed stand pivot transfer to the w/c with mod A. Pt required mod cueing to initiate oral care at the sink, but then was able to complete fully with set up assist. Pt required cueing for termination of task. Pt completed UB bathing with min A. Mod-max A to don shirt. Max A to don pants. Pt with slow processing and reaction time throughout session but overall following most commands with increased time. Pt able to complete peri hygiene in standing with min-mod steadying assist. Pt returned to supine in bed with mod A. Pt was left with all needs met, bed alarm set.   Therapy Documentation Precautions:  Precautions Precautions: Fall Precaution Comments: Lt hemi + Lt hemianopsia at baseline Restrictions Weight Bearing Restrictions: No    Therapy/Group: Individual Therapy  Curtis Sites 11/04/2018, 7:17 AM

## 2018-11-04 NOTE — Progress Notes (Signed)
[facility] PHYSICAL MEDICINE & REHABILITATION PROGRESS NOTE   Subjective/Complaints:   Sister at bedside cutting his nails.  No new issues per family.  Discussed decision to go with skilled nursing placement.   ROS: Limited due to cognitive/behavioral   Objective:   No results found. No results for input(s): WBC, HGB, HCT, PLT in the last 72 hours. No results for input(s): NA, K, CL, CO2, GLUCOSE, BUN, CREATININE, CALCIUM in the last 72 hours.  Intake/Output Summary (Last 24 hours) at 11/04/2018 0619 Last data filed at 11/04/2018 0554 Gross per 24 hour  Intake 480 ml  Output 1050 ml  Net -570 ml     Physical Exam: Vital Signs Blood pressure (!) 132/94, pulse 61, temperature 97.6 F (36.4 C), resp. rate 16, height 5\' 10"  (1.778 m), weight 88.2 kg, SpO2 97 %. Constitutional: No distress . Vital signs reviewed. Sitting up in manual w/c at bedside, food from breakfast still crumbs on face,  NAD HEENT: EOMI, oral membranes moist Neck: supple Cardiovascular: RRR without murmur. No JVD    Respiratory: CTA Bilaterally without wheezes or rales. Normal effort    GI: BS +, non-tender, non-distended  Skin: Warm and dry.  Intact. Psych: Limited to due to cognition- flat affect Musc: No edema in extremities.  No tenderness in extremities. 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 Functional deficits secondary to new posterior left MCA distribution infarct in the setting of prior right MCA infarct with chronic left hemiparesis which require 3+ hours per day of interdisciplinary therapy in a comprehensive inpatient rehab setting.  Physiatrist is providing close team  supervision and 24 hour management of active medical problems listed below.  Physiatrist and rehab team continue to assess barriers to discharge/monitor patient progress toward functional and medical goals  Care Tool:  Bathing    Body parts bathed by patient: Face, Right upper leg, Left upper leg, Chest, Front perineal area, Abdomen, Left arm   Body parts bathed by helper: Right arm, Buttocks, Right lower leg, Left lower leg Body parts n/a: Right lower leg, Left lower leg   Bathing assist Assist Level: Moderate Assistance - Patient 50 - 74%     Upper Body Dressing/Undressing Upper body dressing   What is the patient wearing?: Pull over shirt    Upper body assist Assist Level: Minimal Assistance - Patient > 75%    Lower Body Dressing/Undressing Lower body dressing      What is the patient wearing?: Pants, Underwear/pull up     Lower body assist Assist for lower body dressing: Total Assistance - Patient < 25%     Toileting Toileting    Toileting assist Assist for toileting: Moderate Assistance - Patient 50 - 74%     Transfers Chair/bed transfer  Transfers assist     Chair/bed transfer assist level: Minimal Assistance - Patient > 75%     Locomotion Ambulation   Ambulation assist      Assist level: Minimal Assistance - Patient > 75% Assistive device: Walker-hemi Max distance: 20   Walk 10 feet activity   Assist  Walk 10 feet activity did not occur: Safety/medical concerns  Assist level: Minimal Assistance - Patient > 75% Assistive device: Walker-hemi, Orthosis  Walk 50 feet activity   Assist Walk 50 feet with 2 turns activity did not occur: Safety/medical concerns  Assist level: Minimal Assistance - Patient > 75% Assistive device: Walker-hemi, Orthosis    Walk 150 feet activity   Assist Walk 150 feet activity did not occur: Safety/medical concerns         Walk 10 feet on uneven surface  activity   Assist Walk 10 feet on uneven  surfaces activity did not occur: Safety/medical concerns         Wheelchair     Assist Will patient use wheelchair at discharge?: Yes Type of Wheelchair: Manual Wheelchair activity did not occur: Safety/medical concerns  Wheelchair assist level: Maximal Assistance - Patient 25 - 49% Max wheelchair distance: 100 feet    Wheelchair 50 feet with 2 turns activity    Assist    Wheelchair 50 feet with 2 turns activity did not occur: Safety/medical concerns   Assist Level: Minimal Assistance - Patient > 75%   Wheelchair 150 feet activity     Assist  Wheelchair 150 feet activity did not occur: Safety/medical concerns   Assist Level: Supervision/Verbal cueing   Blood pressure (!) 132/94, pulse 61, temperature 97.6 F (36.4 C), resp. rate 16, height 5\' 10"  (1.778 m), weight 88.2 kg, SpO2 97 %.  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 Patient has hx ofprior right MCA infarction as well as VP shunt for hydrocephalus NHP tomorrow 10/12 2. Antithrombotics: -DVT/anticoagulation:SCDs. Venous Dopplers negative -antiplatelet therapy: Aspirin 81 mg daily 3. Pain Management:Tylenol as needed.    Topamax for HA 4. Mood:Provide emotional support  Question of suicidal ideation on 9/24, psych consulted, recommended Cymbalta.  Per therapies, pt with inconsistent cognition and memory with possibility of manipulative behavior. requested Neuropsych consult. -antipsychotic agents: N/A 5. Neuropsych: This patientis notcapable of making decisions on hisown behalf. 6. Skin/Wound Care:Routine skin checks 7. Fluids/Electrolytes/Nutrition:Routine I/Os.    BMP within acceptable range on 9/25 8. Seizure disorder. Vimpat 100 mg twice daily, Depakote 1000 mg twice daily. EEG negative  No seizures since admission to rehab- 9/27,  has very long latency of response with poor attention.  Do not feel that these represent seizures 9. Post stroke dysphagia. Follow-up speech therapy. Diet has been advanced to regular as of 10/04/2018. 10. Hypertension.  Patient on metoprolol 25 mg twice daily prior to admission. Resume as needed Vitals:   11/03/18 2052 11/04/18 0550  BP: (!) 134/101 (!) 132/94  Pulse: 84 61  Resp: 14 16  Temp: (!) 97.4 F (36.3 C) 97.6 F (36.4 C)  SpO2: 97% 97%   Resumed metoprolol at 12.5mg  bid on 9/21- increase to 25mg    11. Hyperlipidemia. Lipitor 80 mg daily 12. Neurogenic bladder. generally continent. 13. Chronic thrombocytopenia.   Platelets 113 on 9/21   Continue to monitor 14.  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  Would be a good candidate for Botox.  If samples are available may be able to do prior to SNF otherwise will need to follow-up as outpatient  LOS: 30 days A FACE TO FACE EVALUATION WAS PERFORMED  Erick Colace 11/04/2018, 6:19 AM

## 2018-11-04 NOTE — Plan of Care (Signed)
°  Problem: Consults Goal: RH STROKE PATIENT EDUCATION Description: See Patient Education module for education specifics  Outcome: Progressing   Problem: RH BLADDER ELIMINATION Goal: RH STG MANAGE BLADDER WITH ASSISTANCE Description: STG Manage Bladder With min Assistance Outcome: Progressing   Problem: RH SAFETY Goal: RH STG ADHERE TO SAFETY PRECAUTIONS W/ASSISTANCE/DEVICE Description: STG Adhere to Safety Precautions With mod/max Assistance/Device. Outcome: Progressing   Problem: RH COGNITION-NURSING Goal: RH STG USES MEMORY AIDS/STRATEGIES W/ASSIST TO PROBLEM SOLVE Description: STG Uses Memory Aids/Strategies With mod Assistance to Problem Solve. Outcome: Progressing   Problem: RH PAIN MANAGEMENT Goal: RH STG PAIN MANAGED AT OR BELOW [name] PAIN GOAL Description: < 4 Outcome: Progressing   Problem: RH KNOWLEDGE DEFICIT Goal: RH STG INCREASE KNOWLEDGE OF HYPERTENSION Description: Patient/caregiver will verbalize understanding of HTN including diet, exercise, medications, monitoring, and follow up care with mod assist. Outcome: Progressing Goal: RH STG INCREASE KNOWLEGDE OF HYPERLIPIDEMIA Description: Patient/caregiver will verbalize understanding of HLD including diet, exercise, medications, monitoring, and follow up care with mod assist. Outcome: Progressing Goal: RH STG INCREASE KNOWLEDGE OF STROKE PROPHYLAXIS Description: Patient/caregiver will verbalize understanding of stroke prophylaxis including diet, exercise, medications, monitoring, and follow up care with mod assist. Outcome: Progressing

## 2018-11-04 NOTE — Progress Notes (Signed)
Physical Therapy Session Note  Patient Details  Name: Patrick Franklin MRN: [id number] Date of Birth: January 10, 1971  Today's Date: 11/04/2018 PT Individual Time: 1125-1210 PT Individual Time Calculation (min): 45 min   Short Term Goals: Week 5:  PT Short Term Goal 1 (Week 5): STG=LTG due to ELOS  Skilled Therapeutic Interventions/Progress Updates: Pt presented in bed with sister present agreeable to therapy. Pt performed bed mobility with use of bed features CGA and increased time. PTA donned AFO and shoes total A for time management. Pt performed stand pivot transfer to w/c minA with use of HW. Pt propelled to rehab gym supervision with verbal cues for direction. Pt ambulated 72f x 2 with minA and HW. Pt only required x1 bout of re-direction as began to turn in wrong way but was easily re-directed. Pt returned to room at end of session and agreeable to remain in w/c with belt alarm on, call bell within reach and needs met.      Therapy Documentation Precautions:  Precautions Precautions: Fall Precaution Comments: Lt hemi + Lt hemianopsia at baseline Restrictions Weight Bearing Restrictions: No General:   Vital Signs: Therapy Vitals Temp: 98.2 F (36.8 C) Pulse Rate: 76 Resp: 16 BP: (!) 123/91 Patient Position (if appropriate): Sitting Oxygen Therapy SpO2: 98 % O2 Device: Room Air    Therapy/Group: Individual Therapy  Win Guajardo  Geonna Lockyer, PTA  11/04/2018, 4:11 PM

## 2018-11-04 NOTE — Discharge Summary (Signed)
Physician Discharge Summary  Patient ID: BEATRIZ SETTLES MRN: [id number] DOB/AGE: 09-20-1970 48 y.o.  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  Labs:  Basic Metabolic Panel: No results for input(s): NA, K, CL, CO2, GLUCOSE, BUN, CREATININE, CALCIUM, MG, PHOS in the last 168 hours.  CBC: No results for input(s): WBC, NEUTROABS, HGB, HCT, MCV, PLT in the last 168 hours.  CBG: No results for input(s): GLUCAP in the last 168 hours.  Family history.  Maternal uncle with CVA.  Reports a history of hypertension hyperlipidemia.  Denies diabetes mellitus or colon cancer  Brief HPI:   ADAIN GEURIN is a 48 y.o. right-handed male with history of large right MCA infarction with large right cerebral encephalomalacia, hydrocephalus status post VP shunt at [facility] with residual left side hemiplegia and left side hemianopsia, seizure disorder followed by Dr. Karel Jarvis with neurology services, neurogenic bladder, asthma, hypertension.  Per chart review lives with sister and family he does have a personal care attendant.  He had been attending adult day program.  Per sister he would ambulate approxi-25 feet to the bathroom needing assistance.  Presented 10/01/2018 with garbled speech left side gaze.  Cranial CT scan negative for acute changes.  CTA of head and neck mid ICA short segment high-grade stenosis.  No large vessel occlusion.  COVID negative, valproic acid 83,  ammonia 24, platelets 70-82,000, chemistries unremarkable.  CT perfusion scan showed no core infarction.  A follow-up cranial CT scan completed showing progressive posterior left MCA infarction.  Patient is not a candidate for carotid intervention.  Lower extremity Dopplers negative.  Echocardiogram with ejection fraction of 60% no source of embolism.  EEG negative for seizure there was noted diffuse encephalopathy as well as cortical dysfunction in right hemisphere.  Neurology consulted placed on low-dose aspirin.  Patient remained on Vimpat as well as valproate for seizure disorder.  He initially had been loaded with Keppra discontinued after EEG negative.  Patient was admitted for a comprehensive rehab program   Hospital Course: LARNCE SCHNACKENBERG was admitted to rehab 10/05/2018 for inpatient therapies to consist of PT, ST and OT at least three hours five days a week. Past admission physiatrist, therapy team and rehab RN have worked together to provide customized collaborative inpatient rehab.  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  Patient remained on low-dose aspirin he would follow neurology services.  He was not a candidate for carotid intervention.  SCDs for DVT prophylaxis venous Doppler studies negative.  Pain management intermittent bouts of headaches maintained on Topamax.  Seizure disorder Vimpat 100 mg twice daily, Depakote 1000 mg twice daily EEG negative.  Although EEGs were negative patient does have a very long latency of response with poor attention.  Did not feel these were seizure related.  His diet has been advanced to regular.  Blood pressures controlled on low-dose metoprolol 25 mg twice daily.  Hyperlipidemia with  Lipitor ongoing.  Suspect neurogenic bladder generally continent with routine toileting.  Chronic thrombocytopenia latest  platelet count 113,000 no bleeding episodes.  Left elbow flexor wrist flexor and knee flexor spasticity discussing plan for possible Botox as outpatient.  Neurology services was consulted 10/18/2022 reports of some suicidal ideations and patient appeared to be a low risk as far as intent to self-harm and placed on Cymbalta.  He was cooperative with therapies needing some encouragement.   Blood pressures were monitored on TID basis and monitored  . He/ is generally continent of bowel and bladder with routine toileting.  He/ has made gains during rehab stay and is attending therapies  He/ will continue to receive follow up therapies   after discharge  Rehab course: During patient's stay in rehab weekly team conferences were held to monitor patient's progress, set goals and discuss barriers to discharge. At admission, patient required moderate assist ambulate 3 feet with a hemiwalker, moderate assist sit to stand, moderate assist stand pivot transfers, total assist supine to sit.  Total assist for ADLs  Physical exam.  Blood pressure 146/94 pulse 71 temperature 98.4 respirations 19 oxygen saturation 97% room air Constitutional.  Obese sitting in bed eyes closed HEENT Eyes.  Pupils round and reactive to light no discharge Right ear external ear normal Left ear external ear normal skull depression on the right from history of craniectomy VP shunt Neck.  Supple nontender no thyromegaly without JVD Cardiovascular normal rate and rhythm exam reveals no friction rub or murmur heard Respiratory.  Effort normal no respiratory distress no wheezes GI.  Soft exhibits no distention no abdominal tenderness positive bowel sounds Musculoskeletal General no deformity or edema Neurological patient would occasionally mouths words he would speak but very delayed appears to move all 4 limbs but inconsistent  He/  has had improvement in activity tolerance, balance, postural control as well as ability to compensate  for deficits. He/ has had improvement in functional use RUE/LUE  and RLE/LLE as well as improvement in awareness.  Patient transferred supine to sitting edge of bed supervision.  He needed help putting on his left AFO.  Patient perform stand pivot transfers with left AFO hemiwalker minimal assist from sitting edge of bed wheelchair.  Required verbal cueing for sequencing and hemiwalker.  Completed obstacle course x2 including navigating cones stepping up and over hockey-stick and avoiding parallel bars and rolling stool along pathway with left AFO.  Patient completed bed mobility edge of bed with moderate assist.  Patient with poor sitting balance and spatial awareness.  Completed stand pivot transfers to wheelchair with moderate assist with Occupational Therapy.  Required cueing for termination of tasks.  Patient completed upper body bathing minimal assist mod max assist to don shirt max assist to don pants.  Speech therapy follow-up patient overall moderate assist cues for initiation and sustained attention during familiar color block pattern board tasks.  Patient matched color blocks to associated colors in a pattern containing 6 colors of 95% accuracy.  Due to ongoing limited support at home family had felt skilled nursing facility was needed bed becoming available 11/05/2018       Disposition: Discharge disposition: 03-Skilled [facility]     Discharge to skilled nursing facility   Diet: Regular  Special Instructions: No smoking or alcohol  Medications at discharge 1.  Tylenol as needed 2.  Aspirin 81 mg p.o. daily 3.  Lipitor 80 mg p.o. daily 4.  Depakote 1000 mg every 12 hours 5.  Cymbalta 60  mg p.o. daily 7.  Vimpat 200 mg p.o. twice daily 8.  Lopressor 25 mg p.o. twice daily 9.  Topamax 25 mg p.o. nightly 10 Vitamin D 5000 units p.o. daily 11.  Multivitamin 1 tab daily   Discharge Instructions    Ambulatory referral to Physical Medicine Rehab   Complete by: As directed     Follow up one month left MCA infarct   Patient for SNF placement       Contact information for follow-up providers    Kirsteins, Victorino SparrowAndrew E, MD Follow up.   Specialty: Physical Medicine and Rehabilitation Why: Office to call for appointment Contact information: [address] [place] [address] [phone]        Van ClinesAquino, Karen M, MD Follow up.   Specialty: Neurology Why: Call for appointment Contact information: [address] [place] [address] [phone]            Contact information for after-discharge care    Destination    [facility]erred [facility] .   Service: Skilled Nursing Contact information: [address] [place] [id number] [phone]                  Signed: Charlton AmorDaniel J Kristjan Derner 11/05/2018, 5:32 AM

## 2018-11-04 NOTE — Progress Notes (Signed)
Physical Therapy Discharge Summary  Patient Details  Name: Patrick Franklin MRN: [id number] Date of Birth: 04-23-1970  Today's Date: 11/04/2018    Patient has met 6 of 7 long term goals due to improved activity tolerance, improved balance, improved postural control, decreased pain, improved attention, improved awareness and improved coordination.  Patient to discharge at an ambulatory level [place].   Patient's care partner is independent to provide the necessary physical and cognitive assistance at discharge. Pt will d/c to SNF at [facility] level.   Reasons goals not met: Pt will be transported via ambulance vs car transfer for safety/   Recommendation:  Patient will benefit from ongoing skilled PT services in skilled nursing facility setting to continue to advance safe functional mobility, address ongoing impairments in gait, safety, awareness, balance, transfers, and minimize fall risk.  Equipment: No equipment provided  Reasons for discharge: treatment goals met  Patient/family agrees with progress made and goals achieved: Yes  PT Discharge Precautions/Restrictions Precautions Precautions: Fall Precaution Comments: Lt hemi + Lt hemianopsia at baseline Restrictions Weight Bearing Restrictions: No Vital Signs Therapy Vitals Temp: 98.2 F (36.8 C) Pulse Rate: 76 Resp: 16 BP: (!) 123/91 Patient Position (if appropriate): Sitting Oxygen Therapy SpO2: 98 % O2 Device: Room Air Pain Pain Assessment Faces Pain Scale: No hurt Vision/Perception     Cognition Overall Cognitive Status: Impaired/Different from baseline Arousal/Alertness: Awake/alert Orientation Level: Oriented to person;Oriented to place;Disoriented to time;Disoriented to situation Attention: Sustained Sustained Attention: Impaired Sensation Sensation Light Touch: Impaired by gross assessment Motor  Motor Motor: Hemiplegia;Abnormal tone;Abnormal postural alignment and control Motor - Skilled Clinical  Observations: Lt hemiplegia, UE>LE  Mobility Bed Mobility Bed Mobility: Rolling Right;Rolling Left;Supine to Sit;Sit to Supine Rolling Right: Supervision/verbal cueing Rolling Left: Supervision/Verbal cueing Supine to Sit: Contact Guard/Touching assist Sit to Supine: Contact Guard/Touching assist Transfers Transfers: Sit to Stand;Stand to Sit;Stand Pivot Transfers Sit to Stand: Minimal Assistance - Patient > 75% Stand Pivot Transfers: Minimal Assistance - Patient > 75% Transfer (Assistive device): Hemi-walker Locomotion  Gait Ambulation: Yes Gait Assistance: Minimal Assistance - Patient > 75% Assistive device: Hemi-walker Gait Assistance Details: Tactile cues for weight shifting;Verbal cues for sequencing;Manual facilitation for weight shifting Gait Gait: Yes Gait Pattern: Impaired Gait Pattern: Step-to pattern;Decreased step length - left;Decreased stance time - left;Decreased weight shift to left  Trunk/Postural Assessment  Cervical Assessment Cervical Assessment: Within Functional Limits Thoracic Assessment Thoracic Assessment: Within Functional Limits Lumbar Assessment Lumbar Assessment: Within Functional Limits Postural Control Postural Control: Deficits on evaluation Righting Reactions: delayed  Balance Balance Balance Assessed: Yes Static Sitting Balance Static Sitting - Balance Support: Feet supported Static Sitting - Level of Assistance: 5: Stand by assistance Dynamic Sitting Balance Dynamic Sitting - Balance Support: Feet unsupported Dynamic Sitting - Level of Assistance: 5: Stand by assistance Static Standing Balance Static Standing - Balance Support: During functional activity Static Standing - Level of Assistance: 4: Min assist Dynamic Standing Balance Dynamic Standing - Balance Support: During functional activity Dynamic Standing - Level of Assistance: 4: Min assist Extremity Assessment      RLE Assessment RLE Assessment: Within Functional  Limits General Strength Comments: unable to formally assess, decreased LE strength LLE Assessment LLE Assessment: Exceptions to [facility] General Strength Comments: unable to formally assess, impaired    Rosita DeChalus 11/04/2018, 4:47 PM

## 2018-11-05 DIAGNOSIS — F3342 Major depressive disorder, recurrent, in full remission: Secondary | ICD-10-CM

## 2018-11-05 DIAGNOSIS — G8110 Spastic hemiplegia affecting unspecified side: Secondary | ICD-10-CM

## 2018-11-05 MED ORDER — DIVALPROEX SODIUM 500 MG PO DR TAB: 1000.0000 mg | DELAYED_RELEASE_TABLET | Freq: Two times a day (BID) | ORAL | Status: DC

## 2018-11-05 MED ORDER — TOPIRAMATE 25 MG PO TABS: 25.0000 mg | ORAL_TABLET | Freq: Every day | ORAL | 0 refills | Status: DC

## 2018-11-05 MED ORDER — ATORVASTATIN CALCIUM 80 MG PO TABS: ORAL_TABLET | ORAL | 2 refills | Status: DC

## 2018-11-05 MED ORDER — DULOXETINE HCL 60 MG PO CPEP: 60.0000 mg | ORAL_CAPSULE | Freq: Every day | ORAL | 0 refills | Status: DC

## 2018-11-05 MED ORDER — ACETAMINOPHEN 325 MG PO TABS: 650.0000 mg | ORAL_TABLET | ORAL | Status: DC | PRN

## 2018-11-05 NOTE — Progress Notes (Signed)
[facility] PHYSICAL MEDICINE & REHABILITATION PROGRESS NOTE   Subjective/Complaints: Patient seen sitting up in bed this morning.  Plan to go to SNF today, patient unaware   ROS: Limited due to cognition  Objective:   No results found. No results for input(s): WBC, HGB, HCT, PLT in the last 72 hours. No results for input(s): NA, K, CL, CO2, GLUCOSE, BUN, CREATININE, CALCIUM in the last 72 hours.  Intake/Output Summary (Last 24 hours) at 11/05/2018 1301 Last data filed at 11/05/2018 0900 Gross per 24 hour  Intake 390 ml  Output 300 ml  Net 90 ml     Physical Exam: Vital Signs Blood pressure 114/87, pulse 61, temperature 97.8 F (36.6 C), temperature source Oral, resp. rate 15, height 5\' 10"  (1.778 m), weight 88.2 kg, SpO2 99 %. Constitutional: No distress . Vital signs reviewed. HENT: Craniectomy site C/D/I Eyes: EOMI. No discharge. Cardiovascular: No JVD. Respiratory: Normal effort.  No stridor. GI: Non-distended. Skin: Warm and dry.  Intact. Psych: Limited due to cognition Musc: No edema in extremities.  No tenderness in extremities. Neurologic: Alert Motor examination limited due to cognition, but moving right side spontaneously, unchanged Increased tone on left   Assessment/Plan: 1. Functional deficits secondary to new posterior left MCA distribution infarct in the setting of prior right MCA infarct with chronic left hemiparesis which require 3+ hours per day of interdisciplinary therapy in a comprehensive inpatient rehab setting.  Physiatrist is providing close team supervision and 24 hour management of active medical problems listed below.  Physiatrist and rehab team continue to assess barriers to discharge/monitor patient progress toward functional and medical goals  Care Tool:  Bathing    Body parts bathed by patient: Face, Right upper leg, Left upper leg, Chest, Front perineal area, Abdomen, Left arm(per staff report)   Body parts bathed by helper: Right  arm, Buttocks, Right lower leg, Left lower leg Body parts n/a: Right lower leg, Left lower leg   Bathing assist Assist Level: Moderate Assistance - Patient 50 - 74%     Upper Body Dressing/Undressing Upper body dressing   What is the patient wearing?: Pull over shirt    Upper body assist Assist Level: Minimal Assistance - Patient > 75%(per staff report)    Lower Body Dressing/Undressing Lower body dressing      What is the patient wearing?: Pants, Underwear/pull up     Lower body assist Assist for lower body dressing: Moderate Assistance - Patient 50 - 74%(per staff report)     Toileting Toileting    Toileting assist Assist for toileting: Moderate Assistance - Patient 50 - 74%(per staff report)     Transfers Chair/bed transfer  Transfers assist     Chair/bed transfer assist level: Minimal Assistance - Patient > 75%(per staff report)     Locomotion Ambulation   Ambulation assist      Assist level: Minimal Assistance - Patient > 75% Assistive device: Walker-hemi Max distance: 80ft   Walk 10 feet activity   Assist  Walk 10 feet activity did not occur: Safety/medical concerns  Assist level: Minimal Assistance - Patient > 75% Assistive device: Walker-hemi, Orthosis   Walk 50 feet activity   Assist Walk 50 feet with 2 turns activity did not occur: Safety/medical concerns  Assist level: Minimal Assistance - Patient > 75% Assistive device: Walker-hemi, Orthosis    Walk 150 feet activity   Assist Walk 150 feet activity did not occur: Safety/medical concerns         Walk 10 feet  on uneven surface  activity   Assist Walk 10 feet on uneven surfaces activity did not occur: Safety/medical concerns         Wheelchair     Assist Will patient use wheelchair at discharge?: Yes Type of Wheelchair: Manual Wheelchair activity did not occur: Safety/medical concerns  Wheelchair assist level: Supervision/Verbal cueing Max wheelchair distance:  170ft    Wheelchair 50 feet with 2 turns activity    Assist    Wheelchair 50 feet with 2 turns activity did not occur: Safety/medical concerns   Assist Level: Supervision/Verbal cueing   Wheelchair 150 feet activity     Assist  Wheelchair 150 feet activity did not occur: Safety/medical concerns   Assist Level: Supervision/Verbal cueing   Blood pressure 114/87, pulse 61, temperature 97.8 F (36.6 C), temperature source Oral, resp. rate 15, height 5\' 10"  (1.778 m), weight 88.2 kg, SpO2 99 %.  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 Patient has hx ofprior right MCA infarction as well as VP shunt for hydrocephalus  DC tube SNF  Hospital follow-up in 1 month post discharge 2. Antithrombotics: -DVT/anticoagulation:SCDs. Venous Dopplers negative -antiplatelet therapy: Aspirin 81 mg daily 3. Pain Management:Tylenol as needed.    Topamax for HA 4. Mood:Provide emotional support  Question of suicidal ideation on 9/24, psych consulted, recommended Cymbalta.  Per therapies, pt with inconsistent cognition and memory with possibility of manipulative behavior. requested Neuropsych consult. -antipsychotic agents: N/A 5. Neuropsych: This patientis notcapable of making decisions on hisown behalf. 6. Skin/Wound Care:Routine skin checks 7. Fluids/Electrolytes/Nutrition:Routine I/Os.    BMP within acceptable range on 9/25 8. Seizure disorder. Vimpat 100 mg twice daily, Depakote 1000 mg twice daily. EEG negative  No overt seizures in rehab 9. Post stroke dysphagia. Follow-up speech therapy. Diet has been advanced to regular as of 10/04/2018. 10. Hypertension.  Patient on metoprolol 25 mg twice daily prior to admission. Resume as needed Vitals:   11/04/18 1917 11/05/18 0507  BP: 119/90 114/87  Pulse: 68 61  Resp:  16 15  Temp: 98.4 F (36.9 C) 97.8 F (36.6 C)  SpO2: 99% 99%   Resumed metoprolol at 12.5mg  bid on 9/21- increase to 25mg    Controlled on 10/12  11. Hyperlipidemia. Lipitor 80 mg daily 12. Neurogenic bladder. generally continent. 13. Chronic thrombocytopenia.   Platelets 113 on 9/21  Continue to monitor 14.  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    Will consider Botox as outpatient  LOS: 31 days A FACE TO FACE EVALUATION WAS PERFORMED  Jerris Fleer [name] 11/05/2018, 1:01 PM

## 2018-11-05 NOTE — Plan of Care (Signed)
°  Problem: Consults °Goal: RH STROKE PATIENT EDUCATION °Description: See Patient Education module for education specifics  °Outcome: Progressing °  °Problem: RH BLADDER ELIMINATION °Goal: RH STG MANAGE BLADDER WITH ASSISTANCE °Description: STG Manage Bladder With min Assistance °Outcome: Progressing °  °Problem: RH SAFETY °Goal: RH STG ADHERE TO SAFETY PRECAUTIONS W/ASSISTANCE/DEVICE °Description: STG Adhere to Safety Precautions With mod/max Assistance/Device. °Outcome: Progressing °  °Problem: RH COGNITION-NURSING °Goal: RH STG USES MEMORY AIDS/STRATEGIES W/ASSIST TO PROBLEM SOLVE °Description: STG Uses Memory Aids/Strategies With mod Assistance to Problem Solve. °Outcome: Progressing °  °Problem: RH PAIN MANAGEMENT °Goal: RH STG PAIN MANAGED AT OR BELOW [name] PAIN GOAL °Description: < 4 °Outcome: Progressing °  °Problem: RH KNOWLEDGE DEFICIT °Goal: RH STG INCREASE KNOWLEDGE OF HYPERTENSION °Description: Patient/caregiver will verbalize understanding of HTN including diet, exercise, medications, monitoring, and follow up care with mod assist. °Outcome: Progressing °Goal: RH STG INCREASE KNOWLEGDE OF HYPERLIPIDEMIA °Description: Patient/caregiver will verbalize understanding of HLD including diet, exercise, medications, monitoring, and follow up care with mod assist. °Outcome: Progressing °Goal: RH STG INCREASE KNOWLEDGE OF STROKE PROPHYLAXIS °Description: Patient/caregiver will verbalize understanding of stroke prophylaxis including diet, exercise, medications, monitoring, and follow up care with mod assist. °Outcome: Progressing °  °

## 2018-11-05 NOTE — Plan of Care (Signed)
  Problem: Consults Goal: RH STROKE PATIENT EDUCATION Description: See Patient Education module for education specifics  11/05/2018 1448 by Kalman Shan, LPN Outcome: Completed/Met 11/05/2018 1448 by Doran Durand A, LPN Outcome: Progressing   Problem: RH BLADDER ELIMINATION Goal: RH STG MANAGE BLADDER WITH ASSISTANCE Description: STG Manage Bladder With min Assistance 11/05/2018 1448 by Kalman Shan, LPN Outcome: Completed/Met 11/05/2018 1448 by Kalman Shan, LPN Outcome: Progressing   Problem: RH SAFETY Goal: RH STG ADHERE TO SAFETY PRECAUTIONS W/ASSISTANCE/DEVICE Description: STG Adhere to Safety Precautions With mod/max Assistance/Device. 11/05/2018 1448 by Kalman Shan, LPN Outcome: Completed/Met 11/05/2018 1448 by Kalman Shan, LPN Outcome: Progressing   Problem: RH COGNITION-NURSING Goal: RH STG USES MEMORY AIDS/STRATEGIES W/ASSIST TO PROBLEM SOLVE Description: STG Uses Memory Aids/Strategies With mod Assistance to Problem Solve. 11/05/2018 1448 by Kalman Shan, LPN Outcome: Completed/Met 11/05/2018 1448 by Kalman Shan, LPN Outcome: Progressing   Problem: RH PAIN MANAGEMENT Goal: RH STG PAIN MANAGED AT OR BELOW [name] PAIN GOAL Description: < 4 11/05/2018 1448 by Doran Durand A, LPN Outcome: Completed/Met 11/05/2018 1448 by Kalman Shan, LPN Outcome: Progressing   Problem: RH KNOWLEDGE DEFICIT Goal: RH STG INCREASE KNOWLEDGE OF HYPERTENSION Description: Patient/caregiver will verbalize understanding of HTN including diet, exercise, medications, monitoring, and follow up care with mod assist. 11/05/2018 1448 by Kalman Shan, LPN Outcome: Completed/Met 11/05/2018 1448 by Kalman Shan, LPN Outcome: Progressing Goal: RH STG INCREASE KNOWLEGDE OF HYPERLIPIDEMIA Description: Patient/caregiver will verbalize understanding of HLD including diet, exercise, medications, monitoring, and follow up care with  mod assist. 11/05/2018 1448 by Kalman Shan, LPN Outcome: Completed/Met 11/05/2018 1448 by Kalman Shan, LPN Outcome: Progressing Goal: RH STG INCREASE KNOWLEDGE OF STROKE PROPHYLAXIS Description: Patient/caregiver will verbalize understanding of stroke prophylaxis including diet, exercise, medications, monitoring, and follow up care with mod assist. 11/05/2018 1448 by Kalman Shan, LPN Outcome: Completed/Met 11/05/2018 1448 by Doran Durand A, LPN Outcome: Progressing   Problem: RH Pre-functional/Other (Specify) Goal: RH LTG OT (Specify) 1 Description: Pt will consistently follow 1 step instruction during self care tasks 50% of the time  Outcome: Completed/Met

## 2018-11-05 NOTE — Progress Notes (Signed)
Occupational Therapy Discharge Summary  Patient Details  Name: Patrick Franklin MRN: [id number] Date of Birth: 05/07/1970    Patient has met 9 of 9 long term goals due to improved activity tolerance, improved balance, postural control and ability to compensate for deficits.  Patient to discharge at overall Mod Assist level.  Reasons goals not met: all goals met  Recommendation:  Patient will benefit from ongoing skilled OT services in skilled nursing facility setting to continue to advance functional skills in the area of BADL and Reduce care partner burden.  Equipment: defer to next venue of care  Reasons for discharge: treatment goals met  Patient/family agrees with progress made and goals achieved: Yes  OT Discharge Precautions/Restrictions  Precautions Precautions: Fall Precaution Comments: Lt hemi + Lt hemianopsia at baseline General   Vital Signs Therapy Vitals Temp: 97.8 F (36.6 C) Temp Source: Oral Pulse Rate: 61 Resp: 15 BP: 114/87 Patient Position (if appropriate): Lying Oxygen Therapy SpO2: 99 % O2 Device: Room Air ADL ADL Eating: Not assessed Grooming: Moderate assistance(combing hair) Where Assessed-Grooming: Chair Upper Body Bathing: Maximal assistance Where Assessed-Upper Body Bathing: Edge of bed Lower Body Bathing: Dependent(2 assist) Where Assessed-Lower Body Bathing: Edge of bed Upper Body Dressing: Maximal assistance Where Assessed-Upper Body Dressing: Edge of bed Lower Body Dressing: Dependent(2 assist) Where Assessed-Lower Body Dressing: Edge of bed Toileting: Dependent(using Stedy) Where Assessed-Toileting: Bedside Commode Toilet Transfer: Maximal assistance Toilet Transfer Method: Stand pivot Science writer: Engineer, technical sales Transfer: Not assessed Vision Baseline Vision/History: Wears glasses Wears Glasses: At all times    Cognition Overall Cognitive Status: Impaired/Different from  baseline Arousal/Alertness: Awake/alert Orientation Level: Oriented to person;Disoriented to time;Disoriented to situation;Disoriented to place Sensation Sensation Light Touch: Impaired by gross assessment Coordination Gross Motor Movements are Fluid and Coordinated: No Fine Motor Movements are Fluid and Coordinated: No Motor  Motor Motor: Hemiplegia;Abnormal tone;Abnormal postural alignment and control Motor - Skilled Clinical Observations: Lt hemiplegia, UE>LE Mobility  Bed Mobility Bed Mobility: Rolling Right;Rolling Left;Supine to Sit;Sit to Supine Rolling Right: Supervision/verbal cueing Rolling Left: Supervision/Verbal cueing Supine to Sit: Contact Guard/Touching assist Sit to Supine: Contact Guard/Touching assist Transfers Stand to Sit: Minimal Assistance - Patient > 75%  Trunk/Postural Assessment  Cervical Assessment Cervical Assessment: Within Functional Limits Thoracic Assessment Thoracic Assessment: Within Functional Limits Lumbar Assessment Lumbar Assessment: Within Functional Limits Postural Control Postural Control: Deficits on evaluation  Balance Balance Balance Assessed: Yes Static Sitting Balance Static Sitting - Balance Support: Feet supported Static Sitting - Level of Assistance: 5: Stand by assistance Dynamic Sitting Balance Dynamic Sitting - Balance Support: Feet unsupported Dynamic Sitting - Level of Assistance: 5: Stand by assistance Static Standing Balance Static Standing - Balance Support: During functional activity Static Standing - Level of Assistance: 4: Min assist Extremity/Trunk Assessment RUE Assessment RUE Assessment: Within Functional Limits LUE Assessment LUE Assessment: Exceptions to [facility] General Strength Comments: Shoulder subluxation 1 finger width. Per sister, he uses a brace at home and she plans to bring it to [facility]. Baseline as this is from prior CVA years ago   [facility] 11/05/2018, 7:47 AM

## 2018-11-05 NOTE — Progress Notes (Signed)
Social Work Discharge Note   The overall goal for the admission was met for:   Discharge location: [facility]  Length of Stay: No-30 DAYS  Discharge activity level: Yes-MIN-MOD LEVEL  Home/community participation: Yes  Services provided included: MD, RD, PT, OT, SLP, RN, CM, TR, Pharmacy, Neuropsych and SW  Financial Services: Medicare  Follow-up services arranged: Other: NHP  Comments (or additional information):PT NOT AT A LEVEL WHERE SISTER CAN MANAGE HIM AT HOME. MAY NED LTC IN A FACILITY. SISTER HAS APPLIED FOR MEDICAID.  Patient/Family verbalized understanding of follow-up arrangements: Yes  Individual responsible for coordination of the follow-up plan: JULIE-SISTER  Confirmed correct DME delivered: Elease Hashimoto 11/05/2018    Elease Hashimoto

## 2018-11-05 NOTE — Plan of Care (Signed)
Problem: Consults Goal: RH STROKE PATIENT EDUCATION Description: See Patient Education module for education specifics  11/05/2018 1448 by Adria Devon, LPN Outcome: Completed/Met 11/05/2018 1448 by Ellison Carwin A, LPN Outcome: Progressing   Problem: RH BLADDER ELIMINATION Goal: RH STG MANAGE BLADDER WITH ASSISTANCE Description: STG Manage Bladder With min Assistance 11/05/2018 1448 by Adria Devon, LPN Outcome: Completed/Met 11/05/2018 1448 by Adria Devon, LPN Outcome: Progressing   Problem: RH SAFETY Goal: RH STG ADHERE TO SAFETY PRECAUTIONS W/ASSISTANCE/DEVICE Description: STG Adhere to Safety Precautions With mod/max Assistance/Device. 11/05/2018 1448 by Adria Devon, LPN Outcome: Completed/Met 11/05/2018 1448 by Adria Devon, LPN Outcome: Progressing   Problem: RH COGNITION-NURSING Goal: RH STG USES MEMORY AIDS/STRATEGIES W/ASSIST TO PROBLEM SOLVE Description: STG Uses Memory Aids/Strategies With mod Assistance to Problem Solve. 11/05/2018 1448 by Adria Devon, LPN Outcome: Completed/Met 11/05/2018 1448 by Adria Devon, LPN Outcome: Progressing   Problem: RH PAIN MANAGEMENT Goal: RH STG PAIN MANAGED AT OR BELOW [name] PAIN GOAL Description: < 4 11/05/2018 1448 by Ellison Carwin A, LPN Outcome: Completed/Met 11/05/2018 1448 by Adria Devon, LPN Outcome: Progressing   Problem: RH KNOWLEDGE DEFICIT Goal: RH STG INCREASE KNOWLEDGE OF HYPERTENSION Description: Patient/caregiver will verbalize understanding of HTN including diet, exercise, medications, monitoring, and follow up care with mod assist. 11/05/2018 1448 by Adria Devon, LPN Outcome: Completed/Met 11/05/2018 1448 by Adria Devon, LPN Outcome: Progressing Goal: RH STG INCREASE KNOWLEGDE OF HYPERLIPIDEMIA Description: Patient/caregiver will verbalize understanding of HLD including diet, exercise, medications, monitoring, and follow up care with  mod assist. 11/05/2018 1448 by Adria Devon, LPN Outcome: Completed/Met 11/05/2018 1448 by Ellison Carwin A, LPN Outcome: Progressing Goal: RH STG INCREASE KNOWLEDGE OF STROKE PROPHYLAXIS Description: Patient/caregiver will verbalize understanding of stroke prophylaxis including diet, exercise, medications, monitoring, and follow up care with mod assist. 11/05/2018 1448 by Adria Devon, LPN Outcome: Completed/Met 11/05/2018 1448 by Adria Devon, LPN Outcome: Progressing

## 2018-11-07 ENCOUNTER — Ambulatory Visit: Payer: Self-pay | Admitting: Neurology

## 2018-12-18 ENCOUNTER — Encounter: Payer: Medicare Other | Admitting: Physical Medicine & Rehabilitation

## 2019-03-07 ENCOUNTER — Emergency Department: Payer: Medicare Other

## 2019-03-07 ENCOUNTER — Emergency Department
Admission: EM | Admit: 2019-03-07 | Discharge: 2019-03-07 | Disposition: A | Discharge status: Home / Self Care | Payer: Medicare Other | Attending: Student in an Organized Health Care Education/Training Program | Admitting: Student in an Organized Health Care Education/Training Program

## 2019-03-07 ENCOUNTER — Other Ambulatory Visit: Payer: Self-pay

## 2019-03-07 DIAGNOSIS — Z79899 Other long term (current) drug therapy: Secondary | ICD-10-CM | POA: Diagnosis not present

## 2019-03-07 DIAGNOSIS — M25512 Pain in left shoulder: Secondary | ICD-10-CM | POA: Diagnosis not present

## 2019-03-07 DIAGNOSIS — Z23 Encounter for immunization: Secondary | ICD-10-CM | POA: Diagnosis not present

## 2019-03-07 DIAGNOSIS — Y9389 Activity, other specified: Secondary | ICD-10-CM | POA: Diagnosis not present

## 2019-03-07 DIAGNOSIS — J45909 Unspecified asthma, uncomplicated: Secondary | ICD-10-CM | POA: Diagnosis not present

## 2019-03-07 DIAGNOSIS — M25552 Pain in left hip: Secondary | ICD-10-CM | POA: Insufficient documentation

## 2019-03-07 DIAGNOSIS — Z8673 Personal history of transient ischemic attack (TIA), and cerebral infarction without residual deficits: Secondary | ICD-10-CM | POA: Insufficient documentation

## 2019-03-07 DIAGNOSIS — S01312A Laceration without foreign body of left ear, initial encounter: Secondary | ICD-10-CM | POA: Insufficient documentation

## 2019-03-07 DIAGNOSIS — Z7982 Long term (current) use of aspirin: Secondary | ICD-10-CM | POA: Diagnosis not present

## 2019-03-07 DIAGNOSIS — Y999 Unspecified external cause status: Secondary | ICD-10-CM | POA: Diagnosis not present

## 2019-03-07 DIAGNOSIS — W19XXXA Unspecified fall, initial encounter: Secondary | ICD-10-CM

## 2019-03-07 DIAGNOSIS — Y92122 Bedroom in nursing home as the place of occurrence of the external cause: Secondary | ICD-10-CM | POA: Insufficient documentation

## 2019-03-07 DIAGNOSIS — W04XXXA Fall while being carried or supported by other persons, initial encounter: Secondary | ICD-10-CM | POA: Diagnosis not present

## 2019-03-07 DIAGNOSIS — T85618A Breakdown (mechanical) of other specified internal prosthetic devices, implants and grafts, initial encounter: Secondary | ICD-10-CM

## 2019-03-07 DIAGNOSIS — I10 Essential (primary) hypertension: Secondary | ICD-10-CM | POA: Diagnosis not present

## 2019-03-07 DIAGNOSIS — S0990XA Unspecified injury of head, initial encounter: Secondary | ICD-10-CM | POA: Diagnosis present

## 2019-03-07 LAB — BASIC METABOLIC PANEL
Anion gap: 9 (ref 5–15)
BUN: 18 mg/dL (ref 6–20)
CO2: 29 mmol/L (ref 22–32)
Calcium: 8.6 mg/dL — ABNORMAL LOW (ref 8.9–10.3)
Chloride: 105 mmol/L (ref 98–111)
Creatinine, Ser: 0.96 mg/dL (ref 0.61–1.24)
GFR calc Af Amer: >60 mL/min (ref >60)
GFR calc non Af Amer: >60 mL/min (ref >60)
Glucose, Bld: 91 mg/dL (ref 70–99)
Potassium: 4.3 mmol/L (ref 3.5–5.1)
Sodium: 143 mmol/L (ref 135–145)

## 2019-03-07 LAB — CBC
HCT: 46.6 % (ref 39.0–52.0)
Hemoglobin: 15.2 g/dL (ref 13.0–17.0)
MCH: 30 pg (ref 26.0–34.0)
MCHC: 32.6 g/dL (ref 30.0–36.0)
MCV: 92.1 fL (ref 80.0–100.0)
Platelets: 77 10*3/uL — ABNORMAL LOW (ref 150–400)
RBC: 5.06 MIL/uL (ref 4.22–5.81)
RDW: 13.1 % (ref 11.5–15.5)
WBC: 3.9 10*3/uL — ABNORMAL LOW (ref 4.0–10.5)
nRBC: 0 % (ref 0.0–0.2)

## 2019-03-07 LAB — PROTIME-INR
INR: 0.9 (ref 0.8–1.2)
Prothrombin Time: 11.9 seconds (ref 11.4–15.2)

## 2019-03-07 MED ORDER — LIDOCAINE-EPINEPHRINE (PF) 2 %-1:200000 IJ SOLN
10.0000 mL | Freq: Once | INTRAMUSCULAR | Status: AC
  Administered 2019-03-07: 10 mL via INTRADERMAL
  Filled 2019-03-07 (×2): qty 10

## 2019-03-07 MED ORDER — TETANUS-DIPHTH-ACELL PERTUSSIS 5-2.5-18.5 LF-MCG/0.5 IM SUSP
0.5000 mL | Freq: Once | INTRAMUSCULAR | Status: AC
  Administered 2019-03-07: 0.5 mL via INTRAMUSCULAR
  Filled 2019-03-07: qty 0.5

## 2019-03-07 MED ORDER — BACITRACIN ZINC 500 UNIT/GM EX OINT
TOPICAL_OINTMENT | Freq: Once | CUTANEOUS | Status: AC
  Administered 2019-03-07: 1 via TOPICAL
  Filled 2019-03-07: qty 0.9

## 2019-03-07 NOTE — ED Triage Notes (Addendum)
Pt arrives via EMS from [facility] after having a fall off his bed when the staff at the facility were cleaning him- pt c/o head, back, and hip pain- pt arrives in a c collar- pt on blood thinners and has a lac to the back of the head with bleeding controlled- possible LOC- pt left side effected by previous stroke

## 2019-03-07 NOTE — ED Notes (Signed)
Pt unable to sign discharge d/t past stroke

## 2019-03-07 NOTE — ED Provider Notes (Signed)
[facility] Emergency Department Provider Note    First MD Initiated Contact with Patient 03/07/19 0745     (approximate)  I have reviewed the triage vital signs and the nursing notes.   HISTORY  Chief Complaint Fall    HPI RISHARD DELANGE is a 49 y.o. male below listed past medical history presenting from facility.  Was being assisted out of bed and fell landing on his left side.  He is hemiplegic on the left.  Did hit his ear and left side of his head causing small laceration with bleeding.  Complaining of left shoulder pain as well as left hip pain.  States the pain is mild to moderate.  He denies any chest pain.  No abdominal pain no nausea or vomiting.  No new blurry vision.    Past Medical History:  Diagnosis Date  . Asthma   . Cardiomegaly 04/13/2016  . Depression   . DVT (deep venous thrombosis) (HCC)   . GERD (gastroesophageal reflux disease)   . Hemiplegia and hemiparesis    Left side  . Hypertension   . Neurogenic bladder   . Neurogenic bowel   . Neuropathy   . Pulmonary embolism (HCC) 01/13/2016  . Seizures (HCC)   . Stroke (HCC) 12/31/2015   left side deficits   . Subluxation of shoulder joint 04/13/2016   left  . UTI (urinary tract infection)    Family History  Problem Relation Age of Onset  . Stroke Maternal Uncle   . Stroke Maternal Uncle    Past Surgical History:  Procedure Laterality Date  . CRANIECTOMY  2017  . CRANIOPLASTY  2018  . IVC FILTER INSERTION  2017  . PEG TUBE PLACEMENT  2017  . VENTRICULOPERITONEAL SHUNT  2018   Patient Active Problem List   Diagnosis Date Noted  . Spastic hemiparesis (HCC)   . Suicidal ideation   . Elevated serum creatinine   . 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 Blood pressure increase diastolic   . Seizures (HCC)   . Vascular headache   . 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 Cerebral embolism with cerebral infarction 10/03/2018  . Neurological deficit  present 10/01/2018  . Thrombocytopenia (HCC) 10/01/2018  . Neurogenic bladder 10/01/2018  . Medicare annual wellness visit, subsequent 07/23/2018  . Spastic hemiplegia affecting nondominant side (HCC) 03/26/2018  . Impaired decision making 08/30/2017  . Aorto-iliac atherosclerosis (HCC) 08/29/2017  . History of renal stone 08/22/2017  . Seizure disorder (HCC) 08/09/2017  . Hemiparesis affecting left side as late effect of stroke (HCC) 08/09/2017  . Essential hypertension 05/15/2017  . Urinary retention 05/15/2017  . Hyperlipidemia 05/15/2017  . Hx of ischemic right MCA stroke 05/15/2017  . MDD (major depressive disorder) 05/15/2017      Prior to Admission medications   Medication Sig Start Date End Date Taking? Authorizing Provider  acetaminophen (TYLENOL) 325 MG tablet Take 2 tablets (650 mg total) by mouth every 4 (four) hours as needed for mild pain (or temp > 37.5 C (99.5 F)). 11/05/18   Angiulli, Mcarthur Rossetti, PA-C  aspirin EC 81 MG tablet Take 81 mg by mouth daily.    [provider]  atorvastatin (LIPITOR) 80 MG tablet TAKE 1 TABLET BY MOUTH ONCE DAILY AT BEDTIME 11/05/18   Angiulli, Mcarthur Rossetti, PA-C  Cholecalciferol (VITAMIN D3) 5000 units CAPS Take 5,000 Units by mouth daily.    [provider]  divalproex (DEPAKOTE) 500 MG DR tablet  Take 2 tablets (1,000 mg total) by mouth every 12 (twelve) hours. 11/05/18   Angiulli, Mcarthur Rossetti, PA-C  DULoxetine (CYMBALTA) 60 MG capsule Take 1 capsule (60 mg total) by mouth daily. For depression. 11/05/18   Angiulli, Mcarthur Rossetti, PA-C  lacosamide (VIMPAT) 200 MG TABS tablet Take 1 tablet (200 mg total) by mouth 2 (two) times daily. 05/15/18   Van Clines, MD  metoprolol tartrate (LOPRESSOR) 25 MG tablet TAKE 1 TABLET BY MOUTH TWICE A DAY 10/23/18   Doreene Nest, NP  Multiple Vitamin (MULTIVITAMIN WITH MINERALS) TABS tablet Take 1 tablet by mouth daily.    [provider]  topiramate (TOPAMAX) 25 MG tablet Take 1 tablet  (25 mg total) by mouth at bedtime. 11/05/18   Angiulli, Mcarthur Rossetti, PA-C    Allergies Versed [midazolam]    Social History Social History   Tobacco Use  . Smoking status: Never Smoker  . Smokeless tobacco: Never Used  Substance Use Topics  . Alcohol use: Not Currently  . Drug use: Never    Review of Systems Patient denies headaches, rhinorrhea, blurry vision, numbness, shortness of breath, chest pain, edema, cough, abdominal pain, nausea, vomiting, diarrhea, dysuria, fevers, rashes or hallucinations unless otherwise stated above in HPI. ____________________________________________   PHYSICAL EXAM:  VITAL SIGNS: Vitals:   03/07/19 0743  BP: (!) 141/96  Pulse: 64  Resp: 14  Temp: 97.8 F (36.6 C)  SpO2: 98%    Constitutional: Alert, chronic left hemiplegia Eyes: Conjunctivae are normal.  Head: 1cm laceration to base of left ear, no hemotympanum Nose: No congestion/rhinnorhea. Mouth/Throat: Mucous membranes are moist.   Neck: No stridor. Painless ROM.  Cardiovascular: Normal rate, regular rhythm. Grossly normal heart sounds.  Good peripheral circulation. Respiratory: Normal respiratory effort.  No retractions. Lungs CTAB. Gastrointestinal: Soft and nontender. No distention. No abdominal bruits. No CVA tenderness. Genitourinary:  Musculoskeletal: No lower extremity tenderness nor edema.  No joint effusions. Neurologic:  Normal speech and language. Left hemiplegia Skin:  Skin is warm, dry and intact. No rash noted. Psychiatric: pleasant and cooperative  ____________________________________________   LABS (all labs ordered are listed, but only abnormal results are displayed)  Results for orders placed or performed during the hospital encounter of 03/07/19 (from the past 24 hour(s))  CBC     Status: Abnormal   Collection Time: 03/07/19  7:37 AM  Result Value Ref Range   WBC 3.9 (L) 4.0 - 10.5 K/uL   RBC 5.06 4.22 - 5.81 MIL/uL   Hemoglobin 15.2 13.0 - 17.0 g/dL    HCT 32.3 55.7 - 32.2 %   MCV 92.1 80.0 - 100.0 fL   MCH 30.0 26.0 - 34.0 pg   MCHC 32.6 30.0 - 36.0 g/dL   RDW 02.5 42.7 - 06.2 %   Platelets 77 (L) 150 - 400 K/uL   nRBC 0.0 0.0 - 0.2 %  Basic metabolic panel     Status: Abnormal   Collection Time: 03/07/19  7:37 AM  Result Value Ref Range   Sodium 143 135 - 145 mmol/L   Potassium 4.3 3.5 - 5.1 mmol/L   Chloride 105 98 - 111 mmol/L   CO2 29 22 - 32 mmol/L   Glucose, Bld 91 70 - 99 mg/dL   BUN 18 6 - 20 mg/dL   Creatinine, Ser 3.76 0.61 - 1.24 mg/dL   Calcium 8.6 (L) 8.9 - 10.3 mg/dL   GFR calc non Af Amer >60 >60 mL/min   GFR calc Af Amer >  60 >60 mL/min   Anion gap 9 5 - 15  Protime-INR     Status: None   Collection Time: 03/07/19  7:37 AM  Result Value Ref Range   Prothrombin Time 11.9 11.4 - 15.2 seconds   INR 0.9 0.8 - 1.2   ____________________________________________  EKG My review and personal interpretation at Time: 7:36   Indication: fall  Rate: 70  Rhythm: sinus Axis: normal Other: normal intervals, no stemi or depressions ____________________________________________  RADIOLOGY  I personally reviewed all radiographic images ordered to evaluate for the above acute complaints and reviewed radiology reports and findings.  These findings were personally discussed with the patient.  Please see medical record for radiology report.  ____________________________________________   PROCEDURES  Procedure(s) performed:  Marland KitchenMarland KitchenLaceration Repair  Date/Time: 03/07/2019 9:41 AM Performed by: Willy Eddy, MD Authorized by: Willy Eddy, MD   Consent:    Consent obtained:  Verbal   Consent given by:  Patient   Risks discussed:  Infection, pain, retained foreign body, poor cosmetic result and poor wound healing Anesthesia (see MAR for exact dosages):    Anesthesia method:  Local infiltration   Local anesthetic:  Lidocaine 1% w/o epi Laceration details:    Location:  Ear   Ear location:  L ear   Length  (cm):  1   Depth (mm):  3 Repair type:    Repair type:  Simple Exploration:    Hemostasis achieved with:  Direct pressure   Wound exploration: entire depth of wound probed and visualized     Contaminated: no   Treatment:    Area cleansed with:  Saline and Betadine   Amount of cleaning:  Extensive   Irrigation solution:  Sterile saline   Visualized foreign bodies/material removed: no   Skin repair:    Repair method:  Sutures   Suture size:  6-0   Suture technique:  Simple interrupted   Number of sutures:  3 Approximation:    Approximation:  Close Post-procedure details:    Dressing:  Sterile dressing and antibiotic ointment   Patient tolerance of procedure:  Tolerated well, no immediate complications      Critical Care performed: no ____________________________________________   INITIAL IMPRESSION / ASSESSMENT AND PLAN / ED COURSE  Pertinent labs & imaging results that were available during my care of the patient were reviewed by me and considered in my medical decision making (see chart for details).   DDX: sdh, iph, shunt malfunction, laceration, contusion, fracture, dislocation  Patrick Franklin is a 49 y.o. who presents to the ED with symptoms as described above.  Will order imaging to evaluate for traumatic injury.  Will plan lack repair.  Clinical Course as of Mar 07 1107  Thu Mar 07, 2019  1106 No evidence of traumatic injury based on imaging.  He is not having any back pain currently.  Do not feel that CT imaging of cervical spine clinically indicated.  At this point he believe he stable and appropriate for outpatient follow-up.   [PR]    Clinical Course User Index [PR] Willy Eddy, MD    The patient was evaluated in Emergency Department today for the symptoms described in the history of present illness. He/she was evaluated in the context of the global COVID-19 pandemic, which necessitated consideration that the patient might be at risk for infection  with the SARS-CoV-2 virus that causes COVID-19. Institutional protocols and algorithms that pertain to the evaluation of patients at risk for COVID-19 are in a state  of rapid change based on information released by regulatory bodies including the CDC and federal and state organizations. These policies and algorithms were followed during the patient's care in the ED.  As part of my medical decision making, I reviewed the following data within the [place] notes reviewed and incorporated, Labs reviewed, notes from prior ED visits and [place] Controlled Substance Database   ____________________________________________   FINAL CLINICAL IMPRESSION(S) / ED DIAGNOSES  Final diagnoses:  Fall, initial encounter  Laceration of left ear, initial encounter      NEW MEDICATIONS STARTED DURING THIS VISIT:  New Prescriptions   No medications on file     Note:  This document was prepared using Dragon voice recognition software and may include unintentional dictation errors.    Merlyn Lot, MD 03/07/19 1109

## 2019-03-07 NOTE — ED Notes (Signed)
Pt transported for xray.

## 2019-03-07 NOTE — ED Notes (Signed)
[facility] made aware that pt was on his way back to the facility

## 2019-05-29 ENCOUNTER — Emergency Department
Admission: EM | Admit: 2019-05-29 | Discharge: 2019-05-29 | Disposition: A | Discharge status: Home / Self Care | Payer: Medicare Other | Attending: Student in an Organized Health Care Education/Training Program | Admitting: Student in an Organized Health Care Education/Training Program

## 2019-05-29 ENCOUNTER — Emergency Department: Payer: Medicare Other

## 2019-05-29 ENCOUNTER — Encounter: Payer: Self-pay | Admitting: Emergency Medicine

## 2019-05-29 ENCOUNTER — Other Ambulatory Visit: Payer: Self-pay

## 2019-05-29 DIAGNOSIS — Z79899 Other long term (current) drug therapy: Secondary | ICD-10-CM | POA: Insufficient documentation

## 2019-05-29 DIAGNOSIS — Z8673 Personal history of transient ischemic attack (TIA), and cerebral infarction without residual deficits: Secondary | ICD-10-CM | POA: Insufficient documentation

## 2019-05-29 DIAGNOSIS — I2510 Atherosclerotic heart disease of native coronary artery without angina pectoris: Secondary | ICD-10-CM | POA: Insufficient documentation

## 2019-05-29 DIAGNOSIS — Z7982 Long term (current) use of aspirin: Secondary | ICD-10-CM | POA: Insufficient documentation

## 2019-05-29 DIAGNOSIS — J45909 Unspecified asthma, uncomplicated: Secondary | ICD-10-CM | POA: Insufficient documentation

## 2019-05-29 DIAGNOSIS — W19XXXA Unspecified fall, initial encounter: Secondary | ICD-10-CM

## 2019-05-29 DIAGNOSIS — Z043 Encounter for examination and observation following other accident: Secondary | ICD-10-CM | POA: Insufficient documentation

## 2019-05-29 DIAGNOSIS — W010XXA Fall on same level from slipping, tripping and stumbling without subsequent striking against object, initial encounter: Secondary | ICD-10-CM | POA: Insufficient documentation

## 2019-05-29 DIAGNOSIS — Z982 Presence of cerebrospinal fluid drainage device: Secondary | ICD-10-CM | POA: Insufficient documentation

## 2019-05-29 DIAGNOSIS — I10 Essential (primary) hypertension: Secondary | ICD-10-CM | POA: Insufficient documentation

## 2019-05-29 DIAGNOSIS — T85618A Breakdown (mechanical) of other specified internal prosthetic devices, implants and grafts, initial encounter: Secondary | ICD-10-CM

## 2019-05-29 NOTE — ED Notes (Signed)
Pt transported to CT ?

## 2019-05-29 NOTE — ED Provider Notes (Signed)
[facility] Emergency Department Provider Note    First MD Initiated Contact with Patient 05/29/19 1152     (approximate)  I have reviewed the triage vital signs and the nursing notes.   HISTORY  Chief Complaint Fall    HPI Patrick Franklin is a 49 y.o. male   with the below listed past medical history presenting to the ER for evaluation of fall.  Fall occurred last night at nursing facility.  Patient does not remember how he fell.  Was not on the ground for prolonged period of time.  States he feels well at this time.  No headache.  No numbness or tingling.  No blurry vision.  Denies any fever.  No nausea or vomiting.  No chest discomfort.  Feels like he is at his baseline.   Past Medical History:  Diagnosis Date  . Asthma   . Cardiomegaly 04/13/2016  . Depression   . DVT (deep venous thrombosis) ([place])   . GERD (gastroesophageal reflux disease)   . Hemiplegia and hemiparesis    Left side  . Hypertension   . Neurogenic bladder   . Neurogenic bowel   . Neuropathy   . Pulmonary embolism ([place]) 01/13/2016  . Seizures ([place])   . Stroke ([place]) 12/31/2015   left side deficits   . Subluxation of shoulder joint 04/13/2016   left  . UTI (urinary tract infection)    Family History  Problem Relation Age of Onset  . Stroke Maternal Uncle   . Stroke Maternal Uncle    Past Surgical History:  Procedure Laterality Date  . CRANIECTOMY  2017  . CRANIOPLASTY  2018  . IVC FILTER INSERTION  2017  . PEG TUBE PLACEMENT  2017  . VENTRICULOPERITONEAL SHUNT  2018   Patient Active Problem List   Diagnosis Date Noted  . Spastic hemiparesis ([place])   . Suicidal ideation   . Elevated serum creatinine   . 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 Blood pressure increase diastolic   . Seizures ([place])   . Vascular headache   . Left middle cerebral artery stroke ([place]) 10/05/2018  . Cerebral embolism with cerebral infarction 10/03/2018  . Neurological deficit present  10/01/2018  . Thrombocytopenia ([place]) 10/01/2018  . Neurogenic bladder 10/01/2018  . Medicare annual wellness visit, subsequent 07/23/2018  . Spastic hemiplegia affecting nondominant side ([place]) 03/26/2018  . Impaired decision making 08/30/2017  . Aorto-iliac atherosclerosis ([place]) 08/29/2017  . History of renal stone 08/22/2017  . Seizure disorder ([place]) 08/09/2017  . Hemiparesis affecting left side as late effect of stroke ([place]) 08/09/2017  . Essential hypertension 05/15/2017  . Urinary retention 05/15/2017  . Hyperlipidemia 05/15/2017  . Hx of ischemic right MCA stroke 05/15/2017  . MDD (major depressive disorder) 05/15/2017      Prior to Admission medications   Medication Sig Start Date End Date Taking? Authorizing Provider  acetaminophen (TYLENOL) 325 MG tablet Take 2 tablets (650 mg total) by mouth every 4 (four) hours as needed for mild pain (or temp > 37.5 C (99.5 F)). 11/05/18   Angiulli, Lavon Paganini, PA-C  aspirin EC 81 MG tablet Take 81 mg by mouth daily.    [provider]  atorvastatin (LIPITOR) 80 MG tablet TAKE 1 TABLET BY MOUTH ONCE DAILY AT BEDTIME 11/05/18   Angiulli, Lavon Paganini, PA-C  Cholecalciferol (VITAMIN D3) 5000 units CAPS Take 5,000 Units by mouth daily.    [provider]  divalproex (DEPAKOTE) 500 MG DR tablet  Take 2 tablets (1,000 mg total) by mouth every 12 (twelve) hours. 11/05/18   Angiulli, Mcarthur Rossetti, PA-C  DULoxetine (CYMBALTA) 60 MG capsule Take 1 capsule (60 mg total) by mouth daily. For depression. 11/05/18   Angiulli, Mcarthur Rossetti, PA-C  lacosamide (VIMPAT) 200 MG TABS tablet Take 1 tablet (200 mg total) by mouth 2 (two) times daily. 05/15/18   Van Clines, MD  metoprolol tartrate (LOPRESSOR) 25 MG tablet TAKE 1 TABLET BY MOUTH TWICE A DAY 10/23/18   Doreene Nest, NP  Multiple Vitamin (MULTIVITAMIN WITH MINERALS) TABS tablet Take 1 tablet by mouth daily.    [provider]  topiramate (TOPAMAX) 25 MG tablet Take 1 tablet (25 mg  total) by mouth at bedtime. 11/05/18   Angiulli, Mcarthur Rossetti, PA-C    Allergies Versed [midazolam]    Social History Social History   Tobacco Use  . Smoking status: Never Smoker  . Smokeless tobacco: Never Used  Substance Use Topics  . Alcohol use: Not Currently  . Drug use: Never    Review of Systems Patient denies headaches, rhinorrhea, blurry vision, numbness, shortness of breath, chest pain, edema, cough, abdominal pain, nausea, vomiting, diarrhea, dysuria, fevers, rashes or hallucinations unless otherwise stated above in HPI. ____________________________________________   PHYSICAL EXAM:  VITAL SIGNS: Vitals:   05/29/19 1141  BP: 122/90  Pulse: 86  Resp: 18  Temp: 97.9 F (36.6 C)  SpO2: 100%    Constitutional: Alert and oriented in NAD Eyes: Conjunctivae are normal.  Head: no evidence of contusion, or ecchymosis, s/p craniectomy Nose: No congestion/rhinnorhea. Mouth/Throat: Mucous membranes are moist.   Neck: No stridor. Painless ROM.  Cardiovascular: Normal rate, regular rhythm. Grossly normal heart sounds.  Good peripheral circulation. Respiratory: Normal respiratory effort.  No retractions. Lungs CTAB. Gastrointestinal: Soft and nontender. No distention. No abdominal bruits. No CVA tenderness. Genitourinary:  Musculoskeletal: No lower extremity tenderness nor edema.  No joint effusions. Neurologic:  Normal speech and language. Chronic left spastic hemiparesis,  Does have some motor of lle.   Skin:  Skin is warm, dry and intact. No rash noted. Psychiatric: Mood and affect are normal. Speech and behavior are normal.  ____________________________________________   LABS (all labs ordered are listed, but only abnormal results are displayed)  No results found for this or any previous visit (from the past 24 hour(s)). ____________________________________________  EKG______________________________  RADIOLOGY  I personally reviewed all radiographic images  ordered to evaluate for the above acute complaints and reviewed radiology reports and findings.  These findings were personally discussed with the patient.  Please see medical record for radiology report.  ____________________________________________   PROCEDURES  Procedure(s) performed:  Procedures    Critical Care performed: no ____________________________________________   INITIAL IMPRESSION / ASSESSMENT AND PLAN / ED COURSE  Pertinent labs & imaging results that were available during my care of the patient were reviewed by me and considered in my medical decision making (see chart for details).   DDX: SDH, SAH, mass, concussion, seizure, contusion  Patrick Franklin is a 49 y.o. who presents to the ED with reported fall last night.  Reportedly at his baseline but wanted to be evaluated sent from facility.  Patient denies any discomfort or complaints.  No reported worsening confusion.  Will order imaging for above complaints.  Clinical Course as of May 28 1413  Wed May 29, 2019  1414 Case was discussed with the patient's sister.  He feels well at this time.  Reportedly did receive Ativan  yesterday for dental procedure.  The remainder of exam is reassuring.  I have had some brief confusion related to that but otherwise seems to be at his baseline at this time.  Do feel patient is appropriate for further outpatient follow-up and discharge back to facility.   [PR]    Clinical Course User Index [PR] Willy Eddy, MD    The patient was evaluated in Emergency Department today for the symptoms described in the history of present illness. He/she was evaluated in the context of the global COVID-19 pandemic, which necessitated consideration that the patient might be at risk for infection with the SARS-CoV-2 virus that causes COVID-19. Institutional protocols and algorithms that pertain to the evaluation of patients at risk for COVID-19 are in a state of rapid change based on  information released by regulatory bodies including the CDC and federal and state organizations. These policies and algorithms were followed during the patient's care in the ED.  As part of my medical decision making, I reviewed the following data within the electronic MEDICAL RECORD NUMBER Nursing notes reviewed and incorporated, Labs reviewed, notes from prior ED visits and [place] Controlled Substance Database   ____________________________________________   FINAL CLINICAL IMPRESSION(S) / ED DIAGNOSES  Final diagnoses:  Fall, initial encounter      NEW MEDICATIONS STARTED DURING THIS VISIT:  New Prescriptions   No medications on file     Note:  This document was prepared using Dragon voice recognition software and may include unintentional dictation errors.    Willy Eddy, MD 05/29/19 1415

## 2019-05-29 NOTE — ED Notes (Signed)
Pt states falling out of bed last night, denies current pain.

## 2019-05-29 NOTE — ED Triage Notes (Signed)
Pt arrival via [facility] from [facility] due to fall. Pt fell last night and night shift found him on the ground but patient doesn't remember falling. [facility] night shift didn't call last night but day shift decided to due to him acting different.   Pt doesn't remember falling but believes he did hit his head. Pt denies pain on assessment. States hx of shunt.

## 2019-05-29 NOTE — ED Notes (Signed)
Physical copy of dc papers signed by patient and sent to records.

## 2019-05-29 NOTE — ED Notes (Signed)
Pt changed into a new brief with Musician. Chux clean, brief clean and dry, patient repositioned and comfortable at this time. Will continue to monitor.

## 2019-05-29 NOTE — Discharge Instructions (Signed)
Your imaging and evaluation in the ER is reassuring.  Please follow up with Neurology and PCP.  Return to the ER for any additional questions or concerns.

## 2019-05-29 NOTE — ED Notes (Signed)
[facility] called for transport back to [facility] 

## 2019-10-17 ENCOUNTER — Encounter: Payer: Self-pay | Admitting: Neurology

## 2019-10-17 ENCOUNTER — Other Ambulatory Visit: Payer: Self-pay

## 2019-10-17 ENCOUNTER — Ambulatory Visit (INDEPENDENT_AMBULATORY_CARE_PROVIDER_SITE_OTHER): Payer: Medicare Other | Admitting: Neurology

## 2019-10-17 VITALS — BP 131/94 | HR 78 | Wt 204.0 lb

## 2019-10-17 DIAGNOSIS — R4689 Other symptoms and signs involving appearance and behavior: Secondary | ICD-10-CM | POA: Diagnosis not present

## 2019-10-17 DIAGNOSIS — R4189 Other symptoms and signs involving cognitive functions and awareness: Secondary | ICD-10-CM | POA: Diagnosis not present

## 2019-10-17 DIAGNOSIS — G40209 Localization-related (focal) (partial) symptomatic epilepsy and epileptic syndromes with complex partial seizures, not intractable, without status epilepticus: Secondary | ICD-10-CM | POA: Diagnosis not present

## 2019-10-17 DIAGNOSIS — F3342 Major depressive disorder, recurrent, in full remission: Secondary | ICD-10-CM | POA: Diagnosis not present

## 2019-10-17 MED ORDER — DULOXETINE HCL 30 MG PO CPEP: ORAL_CAPSULE | ORAL | 11 refills | Status: DC

## 2019-10-17 MED ORDER — DULOXETINE HCL 60 MG PO CPEP: ORAL_CAPSULE | ORAL | 11 refills | Status: DC

## 2019-10-17 NOTE — Patient Instructions (Signed)
1. Increase Cymbalta to 90 mg every night  2. Continue all your other medications  3. Schedule Neurocognitive testing  4. Follow-up in 6 months, call for any changes

## 2019-10-17 NOTE — Progress Notes (Signed)
NEUROLOGY FOLLOW UP OFFICE NOTE  Patrick Franklin [id number] October 26, 1970  HISTORY OF PRESENT ILLNESS: I had the pleasure of seeing Patrick Franklin in follow-up in the neurology clinic on 10/17/2019.  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 He is accompanied by his brother-in-law who helps supplement the history today. He was admitted to [facility] in 09/2018 when he started having left-gaze deviation and was aphasic. There was left mid-ICA high grade stenosis/near occlusion, question left cervical carotid dissection,age-indeterminate. He was evaluated by Neurology and after discussion with family, carotid intervention/embolic workup was not pursued. EEG showed generalized delta slowing, maximal right hemisphere. CT perfusion was attempted twice without meaningful images obtained.Repeat head CT concerning for evolving left hemispheric hypodensity, likely new left temporal occipital stroke. He was discharged to rehab on aspirin 81mg  daily. Vimpat 200mg  BID and Depakote 1000mg  BID were continued, with note of last seizure in June 2020. He has been in an LTAC facility since the second stroke. He now has bowel/bladder incontinence. He was started on Topiramate 25mg  qhs for headache prophylaxis, he reports fair control. He states his memory is "fair sometimes." His sister provided additional information via text regarding concerns of visual changes, hostility towards daughter, delusional thinking wanting to buy a truck, believe son is bilingual and traveling to . He has had sexually inappropriate behavior towards a staff member. He has had 2 falls this year, falling out of bed. He does not remember this, he apparently had Ativan for a dental procedure and had increased confusion and drowsiness, leading to the fall.     History on Initial Assessment 05/01/2017: This is a pleasant 49 year old right-handed man with a history of right MCA stroke s/p hemicraniectomy with  subsequent seizures, presenting to establish care. He and his sister report that he had a stroke in December 2017, then has had 3 seizures since then. The first occurred summer 2018 with no prior warning, he woke up with sore muscles, tongue bite and urinary incontinence. The second seizure occurred in February 2019 in his sleep where his wife reported extremities were extended with tongue bite. The most recent one was last 04/23/17 witnessed by his family. His sister reports they were eating then he slumped forward and arms stiffened with shaking for 2 minutes, left arm was drawn up. He was unresponsive with head turn to the left. After 2-3 minutes, he would calm down, then start having movements but able to answer their questions. He had a lot of vomiting after both seizures. He reports a lot of pressure behind his eyes when he had the seizure. He was brought to [facility] ER where CBC and CMP were unremarkable. I personally reviewed head CT without contrast which showed severe right MCA encephalomalacia, left parietal approach shunt catheter tip in left lateral ventricle, no hydrocephalus. There was a hyperdense focus in the right lateral ventricle, ex vacuo rightward midline shift. He was continued on Vimpat 100mg  BID. He denies any missed medications or sleep deprivation, but there has been a lot of stress with moving to live with his sister and brother-in-law away from his wife. They have a sitter with him during the day while his family is working.   He has pressure behind his eyes on a daily basis. Eye exam reported as normal. He has some neck pain. He has residual weakness on the left side, denies any numbness/tingling or pain. He denies any olfactory/gustatory hallucinations, deja vu, rising epigastric sensation,  myoclonic jerks. Mood is okay. His sister has noticed he stares off at times, but replies when called. Family fills his pillbox for him. He is also taking Ritalin prescribed at rehab because he was  having attention difficulties and needed prompting to initiate activities, not progressing with therapy. They feel this has been very helpful for him, his brother-in-law noticed a difference in interaction when he ran out of medication, he was less interactive. He states he does not take it all the time. There is a family history of stroke. No family history of seizures.  Epilepsy Risk Factors:  Complete right MCA territory encephalomalacia s/p right hemicraniectomy. Otherwise he had a normal birth and early development.  There is no history of febrile convulsions, CNS infections such as meningitis/encephalitis, or family history of seizures.   PAST MEDICAL HISTORY: Past Medical History:  Diagnosis Date  . Asthma   . Cardiomegaly 04/13/2016  . Depression   . DVT (deep venous thrombosis) (HCC)   . GERD (gastroesophageal reflux disease)   . Hemiplegia and hemiparesis    Left side  . Hypertension   . Neurogenic bladder   . Neurogenic bowel   . Neuropathy   . Pulmonary embolism (HCC) 01/13/2016  . Seizures (HCC)   . Stroke (HCC) 12/31/2015   left side deficits   . Subluxation of shoulder joint 04/13/2016   left  . UTI (urinary tract infection)     MEDICATIONS: Current Outpatient Medications on File Prior to Visit  Medication Sig Dispense Refill  . acetaminophen (TYLENOL) 325 MG tablet Take 2 tablets (650 mg total) by mouth every 4 (four) hours as needed for mild pain (or temp > 37.5 C (99.5 F)).    Marland Kitchen aspirin EC 81 MG tablet Take 81 mg by mouth daily.    Marland Kitchen atorvastatin (LIPITOR) 80 MG tablet TAKE 1 TABLET BY MOUTH ONCE DAILY AT BEDTIME 90 tablet 2  . Baclofen 5 MG TABS     . Cholecalciferol (VITAMIN D3) 5000 units CAPS Take 5,000 Units by mouth daily.    . divalproex (DEPAKOTE) 500 MG DR tablet Take 2 tablets (1,000 mg total) by mouth every 12 (twelve) hours.    . DULoxetine (CYMBALTA) 60 MG capsule Take 1 capsule (60 mg total) by mouth daily. For depression. 5 capsule 0  .  lacosamide (VIMPAT) 200 MG TABS tablet Take 1 tablet (200 mg total) by mouth 2 (two) times daily. 180 tablet 3  . metoprolol tartrate (LOPRESSOR) 25 MG tablet TAKE 1 TABLET BY MOUTH TWICE A DAY 180 tablet 2  . Multiple Vitamin (MULTIVITAMIN WITH MINERALS) TABS tablet Take 1 tablet by mouth daily.    Marland Kitchen topiramate (TOPAMAX) 25 MG tablet Take 1 tablet (25 mg total) by mouth at bedtime. 5 tablet 0   No current facility-administered medications on file prior to visit.    ALLERGIES: Allergies  Allergen Reactions  . Versed [Midazolam] Other (See Comments)    Drops blood pressure and heart rate    FAMILY HISTORY: Family History  Problem Relation Age of Onset  . Stroke Maternal Uncle   . Stroke Maternal Uncle     SOCIAL HISTORY: Social History   Socioeconomic History  . Marital status: Divorced    Spouse name: Not on file  . Number of children: Not on file  . Years of education: Not on file  . Highest education level: Not on file  Occupational History  . Not on file  Tobacco Use  . Smoking status: Never Smoker  .  Smokeless tobacco: Never Used  Substance and Sexual Activity  . Alcohol use: Not Currently  . Drug use: Never  . Sexual activity: Not on file  Other Topics Concern  . Not on file  Social History Narrative   Single.   4 children.    Once worked as an Teacher, adult educationestimator.    Enjoys reading.    [facility]   Social Determinants of Health   Financial Resource Strain:   . Difficulty of Paying Living Expenses: Not on file  Food Insecurity:   . Worried About Programme researcher, broadcasting/film/videounning Out of Food in the Last Year: Not on file  . Ran Out of Food in the Last Year: Not on file  Transportation Needs:   . Lack of Transportation (Medical): Not on file  . Lack of Transportation (Non-Medical): Not on file  Physical Activity:   . Days of Exercise per Week: Not on file  . Minutes of Exercise per Session: Not on file  Stress:   . Feeling of Stress : Not on file  Social  Connections:   . Frequency of Communication with Friends and Family: Not on file  . Frequency of Social Gatherings with Friends and Family: Not on file  . Attends Religious Services: Not on file  . Active Member of Clubs or Organizations: Not on file  . Attends BankerClub or Organization Meetings: Not on file  . Marital Status: Not on file  Intimate Partner Violence:   . Fear of Current or Ex-Partner: Not on file  . Emotionally Abused: Not on file  . Physically Abused: Not on file  . Sexually Abused: Not on file     PHYSICAL EXAM: Vitals:   10/17/19 1115  BP: (!) 131/94  Pulse: 78  SpO2: 98%   General: No acute distress, sitting on wheelchair Head:  Skull depression on right Skin/Extremities: No rash, no edema Neurological Exam: alert and oriented to person, place, and time. No aphasia or dysarthria. Fund of knowledge is reduced.  Recent and remote memory are impaired.  Attention and concentration are normal.  MMSE 26/30 MMSE - Mini Mental State Exam 10/17/2019  Orientation to time 4  Orientation to Place 4  Registration 3  Attention/ Calculation 4  Recall 2  Language- name 2 objects 2  Language- repeat 1  Language- follow 3 step command 3  Language- read & follow direction 1  Write a sentence 1  Copy design 1  Total score 26    Cranial nerves: Pupils equal, round. Extraocular movements intact with no nystagmus. Visual fields full. Left facial droop. Motor: Increased tone on left. Muscle strength 5/5 on right UE and LE, 0/5 on left UE and LE.  Finger to nose testing intact on right. Brisk reflexes throughout, L>R.  Gait not tested.  IMPRESSION: This is a pleasant unfortunate 49 yo RH man with a history of large right MCA stroke of unclear etiology with subsequent focal to bilateral tonic-clonic seizures, admitted last 09/2018 for left MCA stroke. He has had improvement in aphasia since hospitalization, but [facility]. No seizures since June 2020, continue Vimpat  200mg  BID, Depakote 1000mg  BID. He is on Topiramate 25mg  qhs and continues to report headaches/tenderness in surgical site. He is also having more cognitive and behavioral issues, we discussed increasing Cymbalta to 90mg  qhs for mood and headaches. His sister asks about dementia, he will be scheduled for Neurocognitive testing, MMSE today 26/30. Continue 24/7 care. Follow-up in 6 months, they know  to call for any changes.    Thank you for allowing me to participate in his care.  Please do not hesitate to call for any questions or concerns.   Patrcia Dolly, M.D.   CC: Dr. Yetta Flock

## 2019-10-19 ENCOUNTER — Emergency Department: Payer: Medicare Other

## 2019-10-19 ENCOUNTER — Emergency Department
Admission: EM | Admit: 2019-10-19 | Discharge: 2019-10-19 | Disposition: A | Discharge status: Home / Self Care | Payer: Medicare Other | Attending: Emergency Medicine | Admitting: Emergency Medicine

## 2019-10-19 ENCOUNTER — Other Ambulatory Visit: Payer: Self-pay

## 2019-10-19 DIAGNOSIS — I10 Essential (primary) hypertension: Secondary | ICD-10-CM | POA: Diagnosis not present

## 2019-10-19 DIAGNOSIS — R42 Dizziness and giddiness: Secondary | ICD-10-CM | POA: Insufficient documentation

## 2019-10-19 DIAGNOSIS — Z7982 Long term (current) use of aspirin: Secondary | ICD-10-CM | POA: Diagnosis not present

## 2019-10-19 DIAGNOSIS — J45909 Unspecified asthma, uncomplicated: Secondary | ICD-10-CM | POA: Diagnosis not present

## 2019-10-19 DIAGNOSIS — D696 Thrombocytopenia, unspecified: Secondary | ICD-10-CM | POA: Insufficient documentation

## 2019-10-19 DIAGNOSIS — Z79899 Other long term (current) drug therapy: Secondary | ICD-10-CM | POA: Diagnosis not present

## 2019-10-19 LAB — COMPREHENSIVE METABOLIC PANEL
ALT: 14 U/L (ref 0–44)
AST: 32 U/L (ref 15–41)
Albumin: 3.7 g/dL (ref 3.5–5.0)
Alkaline Phosphatase: 69 U/L (ref 38–126)
Anion gap: 8 (ref 5–15)
BUN: 12 mg/dL (ref 6–20)
CO2: 24 mmol/L (ref 22–32)
Calcium: 8.9 mg/dL (ref 8.9–10.3)
Chloride: 112 mmol/L — ABNORMAL HIGH (ref 98–111)
Creatinine, Ser: 0.97 mg/dL (ref 0.61–1.24)
GFR calc Af Amer: >60 mL/min (ref >60)
GFR calc non Af Amer: >60 mL/min (ref >60)
Glucose, Bld: 98 mg/dL (ref 70–99)
Potassium: 6.1 mmol/L — ABNORMAL HIGH (ref 3.5–5.1)
Sodium: 144 mmol/L (ref 135–145)
Total Bilirubin: 1.2 mg/dL (ref 0.3–1.2)
Total Protein: 6.4 g/dL — ABNORMAL LOW (ref 6.5–8.1)

## 2019-10-19 LAB — CBC WITH DIFFERENTIAL/PLATELET
Abs Immature Granulocytes: 0.05 10*3/uL (ref 0.00–0.07)
Basophils Absolute: 0 10*3/uL (ref 0.0–0.1)
Basophils Relative: 0 %
Eosinophils Absolute: 0.1 10*3/uL (ref 0.0–0.5)
Eosinophils Relative: 2 %
HCT: 48.2 % (ref 39.0–52.0)
Hemoglobin: 15.8 g/dL (ref 13.0–17.0)
Immature Granulocytes: 1 %
Lymphocytes Relative: 28 %
Lymphs Abs: 1.6 10*3/uL (ref 0.7–4.0)
MCH: 29.8 pg (ref 26.0–34.0)
MCHC: 32.8 g/dL (ref 30.0–36.0)
MCV: 90.8 fL (ref 80.0–100.0)
Monocytes Absolute: 0.4 10*3/uL (ref 0.1–1.0)
Monocytes Relative: 7 %
Neutro Abs: 3.6 10*3/uL (ref 1.7–7.7)
Neutrophils Relative %: 62 %
Platelets: 104 10*3/uL — ABNORMAL LOW (ref 150–400)
RBC: 5.31 MIL/uL (ref 4.22–5.81)
RDW: 13.1 % (ref 11.5–15.5)
WBC: 5.8 10*3/uL (ref 4.0–10.5)
nRBC: 0 % (ref 0.0–0.2)

## 2019-10-19 LAB — BASIC METABOLIC PANEL
Anion gap: 8 (ref 5–15)
BUN: 12 mg/dL (ref 6–20)
CO2: 28 mmol/L (ref 22–32)
Calcium: 8.8 mg/dL — ABNORMAL LOW (ref 8.9–10.3)
Chloride: 109 mmol/L (ref 98–111)
Creatinine, Ser: 0.91 mg/dL (ref 0.61–1.24)
GFR calc Af Amer: >60 mL/min (ref >60)
GFR calc non Af Amer: >60 mL/min (ref >60)
Glucose, Bld: 90 mg/dL (ref 70–99)
Potassium: 4.2 mmol/L (ref 3.5–5.1)
Sodium: 145 mmol/L (ref 135–145)

## 2019-10-19 LAB — TROPONIN I (HIGH SENSITIVITY)
Troponin I (High Sensitivity): 3 ng/L (ref <18)
Troponin I (High Sensitivity): 3 ng/L (ref <18)

## 2019-10-19 LAB — APTT: aPTT: 25 seconds (ref 24–36)

## 2019-10-19 LAB — PROTIME-INR
INR: 1.1 (ref 0.8–1.2)
Prothrombin Time: 13.5 seconds (ref 11.4–15.2)

## 2019-10-19 NOTE — ED Notes (Signed)
Blood resend hemolyzed per lab- lab to redraw

## 2019-10-19 NOTE — ED Notes (Signed)
Green top resent to lab 

## 2019-10-19 NOTE — ED Notes (Signed)
Pt unable to sign consent for discharge, verbal consent obtained

## 2019-10-19 NOTE — ED Notes (Signed)
[facility] to take

## 2019-10-19 NOTE — ED Triage Notes (Addendum)
Patient coming [facility] From [facility] for N/V and dizziness. Patient AO X 4. Patient has hx of stroke affecting left side - no new deficits. Patient's symptoms started at 2100 yesterday.

## 2019-10-19 NOTE — ED Notes (Signed)
Called [facility] for transport to [facility]  [phone]

## 2019-10-19 NOTE — ED Notes (Signed)
Pt resting quietly, no distress noted at this time, cont to monitor

## 2019-10-19 NOTE — ED Notes (Signed)
Pt states he was brought to ED for "not being able to sleep and headache."

## 2019-10-19 NOTE — ED Provider Notes (Signed)
[facility] Emergency Department Provider Note   ____________________________________________    I have reviewed the triage vital signs and the nursing notes.   HISTORY  Chief Complaint Dizziness     HPI Patrick Franklin is a 49 y.o. male with a history of stroke with chronic left-sided deficits, VP shunt who presents with reports of vertiginous symptoms this morning with nausea.  Patient reports that he felt like the room was spinning.  Feels normal now and has no complaints.  No headache.  No new deficits.  No fevers chills nausea or vomiting at this time.  Did not take anything for this.  No history of vertigo in the past.  Past Medical History:  Diagnosis Date  . Asthma   . Cardiomegaly 04/13/2016  . Depression   . DVT (deep venous thrombosis) (HCC)   . GERD (gastroesophageal reflux disease)   . Hemiplegia and hemiparesis    Left side  . Hypertension   . Neurogenic bladder   . Neurogenic bowel   . Neuropathy   . Pulmonary embolism (HCC) 01/13/2016  . Seizures (HCC)   . Stroke (HCC) 12/31/2015   left side deficits   . Subluxation of shoulder joint 04/13/2016   left  . UTI (urinary tract infection)     Patient Active Problem List   Diagnosis Date Noted  . Spastic hemiparesis (HCC)   . Suicidal ideation   . Elevated serum creatinine   . 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 Blood pressure increase diastolic   . Seizures (HCC)   . Vascular headache   . 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 Cerebral embolism with cerebral infarction 10/03/2018  . Neurological deficit present 10/01/2018  . Thrombocytopenia (HCC) 10/01/2018  . Neurogenic bladder 10/01/2018  . Medicare annual wellness visit, subsequent 07/23/2018  . Spastic hemiplegia affecting nondominant side (HCC) 03/26/2018  . Impaired decision making 08/30/2017  . Aorto-iliac atherosclerosis (HCC) 08/29/2017  . History of renal stone 08/22/2017  . Seizure  disorder (HCC) 08/09/2017  . Hemiparesis affecting left side as late effect of stroke (HCC) 08/09/2017  . Essential hypertension 05/15/2017  . Urinary retention 05/15/2017  . Hyperlipidemia 05/15/2017  . Hx of ischemic right MCA stroke 05/15/2017  . MDD (major depressive disorder) 05/15/2017    Past Surgical History:  Procedure Laterality Date  . CRANIECTOMY  2017  . CRANIOPLASTY  2018  . IVC FILTER INSERTION  2017  . PEG TUBE PLACEMENT  2017  . VENTRICULOPERITONEAL SHUNT  2018    Prior to Admission medications   Medication Sig Start Date End Date Taking? Authorizing Provider  acetaminophen (TYLENOL) 325 MG tablet Take 2 tablets (650 mg total) by mouth every 4 (four) hours as needed for mild pain (or temp > 37.5 C (99.5 F)). 11/05/18   Angiulli, Mcarthur Rossetti, PA-C  aspirin EC 81 MG tablet Take 81 mg by mouth daily.    [provider]  atorvastatin (LIPITOR) 80 MG tablet TAKE 1 TABLET BY MOUTH ONCE DAILY AT BEDTIME 11/05/18   Angiulli, Mcarthur Rossetti, PA-C  Baclofen 5 MG TABS  09/14/19   [provider]  Cholecalciferol (VITAMIN D3) 5000 units CAPS Take 5,000 Units by mouth daily.    [provider]  divalproex (DEPAKOTE) 500 MG DR tablet Take 2 tablets (1,000 mg total) by mouth every 12 (twelve) hours. 11/05/18   Angiulli, Mcarthur Rossetti, PA-C  DULoxetine (CYMBALTA) 30 MG capsule Take 1 capsule every night (Take with  60mg  capsule for total of 90mg  every night) 10/17/19   , MD  DULoxetine (CYMBALTA) 60 MG capsule Take 1 capsule every night (take with 30mg  capsule for total of 90mg  every night) 10/17/19   Van Clines, MD  lacosamide (VIMPAT) 200 MG TABS tablet Take 1 tablet (200 mg total) by mouth 2 (two) times daily. 05/15/18   , MD  metoprolol tartrate (LOPRESSOR) 25 MG tablet TAKE 1 TABLET BY MOUTH TWICE A DAY 10/23/18   Van Clines, NP  Multiple Vitamin (MULTIVITAMIN WITH MINERALS) TABS tablet Take 1 tablet by mouth daily.    [provider]  topiramate (TOPAMAX) 25 MG tablet Take 1 tablet (25 mg total) by mouth at bedtime. 11/05/18   Angiulli, Van Clines, PA-C     Allergies Versed [midazolam]  Family History  Problem Relation Age of Onset  . Stroke Maternal Uncle   . Stroke Maternal Uncle     Social History Social History   Tobacco Use  . Smoking status: Never Smoker  . Smokeless tobacco: Never Used  Substance Use Topics  . Alcohol use: Not Currently  . Drug use: Never    Review of Systems  Constitutional: No fever/chills Eyes: No visual changes.  ENT: No sore throat. Cardiovascular: Denies chest pain. Respiratory: Denies shortness of breath. Gastrointestinal: No abdominal pain Genitourinary: Negative for dysuria. Musculoskeletal: Negative for back pain. Skin: Negative for rash. Neurological: As above   ____________________________________________   PHYSICAL EXAM:  VITAL SIGNS: ED Triage Vitals  Enc Vitals Group     BP 10/19/19 0504 (!) 126/91     Pulse Rate 10/19/19 0504 62     Resp 10/19/19 0504 17     Temp 10/19/19 0504 98 F (36.7 C)     Temp src --      SpO2 10/19/19 0504 97 %     Weight 10/19/19 0502 92.5 kg (203 lb 14.8 oz)     Height 10/19/19 0502 1.753 m (5\' 9" )     Head Circumference --      Peak Flow --      Pain Score 10/19/19 0502 0     Pain Loc --      Pain Edu? --      Excl. in GC? --     Constitutional: Alert Eyes: No nystagmus Nose: No congestion/rhinnorhea. Mouth/Throat: Mucous membranes are moist.    Cardiovascular: Normal rate, regular rhythm.  Good peripheral circulation. Respiratory: Normal respiratory effort.  No retractions.  Gastrointestinal: Soft and nontender. No distention.  No CVA tenderness.  Musculoskeletal: No lower extremity tenderness nor edema.  Warm and well perfused Neurologic: No new deficits Skin:  Skin is warm, dry and intact. No rash noted. Psychiatric: Mood and affect are normal. Speech and behavior are  normal.  ____________________________________________   LABS (all labs ordered are listed, but only abnormal results are displayed)  Labs Reviewed  COMPREHENSIVE METABOLIC PANEL - Abnormal; Notable for the following components:      Result Value   Potassium 6.1 (*)    Chloride 112 (*)    Total Protein 6.4 (*)    All other components within normal limits  CBC WITH DIFFERENTIAL/PLATELET - Abnormal; Notable for the following components:   Platelets 104 (*)    All other components within normal limits  PROTIME-INR  APTT  BASIC METABOLIC PANEL  TROPONIN I (HIGH SENSITIVITY)  TROPONIN I (HIGH SENSITIVITY)   ____________________________________________  EKG  None ____________________________________________  RADIOLOGY  Chest x-ray  reviewed by me, no infiltrate or effusion CT head reviewed by me, VP shunt noted, pending radiology review ____________________________________________   PROCEDURES  Procedure(s) performed: No  Procedures   Critical Care performed: No ____________________________________________   INITIAL IMPRESSION / ASSESSMENT AND PLAN / ED COURSE  Pertinent labs & imaging results that were available during my care of the patient were reviewed by me and considered in my medical decision making (see chart for details).  Patient is asymptomatic here in the emergency department, vital signs are reassuring.  HPI is suggestive of vertigo, no new neuro deficits, likely peripheral  Lab work is overall reassuring, normal CBC except mild decrease in platelets which is chronic, normal troponin.  Mildly elevated potassium, strongly suspect hemolysis given normal creatinine, will repeat and if normal appropriate for discharge at this time      ____________________________________________   FINAL CLINICAL IMPRESSION(S) / ED DIAGNOSES  Final diagnoses:  Vertigo        Note:  This document was prepared using Dragon voice recognition software and may  include unintentional dictation errors.   Jene Every, MD 10/19/19 [phone]

## 2019-10-19 NOTE — Discharge Instructions (Signed)
If you develop any worsening vertigo symptoms, especially vertigo that will not go away or combination with fevers or passing out, please return to the ED.  Otherwise follow-up with your PCP within the next week to discuss your continued symptoms.

## 2019-11-08 ENCOUNTER — Encounter: Payer: Self-pay | Admitting: Psychology

## 2019-11-08 ENCOUNTER — Ambulatory Visit (INDEPENDENT_AMBULATORY_CARE_PROVIDER_SITE_OTHER): Payer: Medicare Other | Admitting: Psychology

## 2019-11-08 ENCOUNTER — Ambulatory Visit: Payer: Medicare Other | Admitting: Psychology

## 2019-11-08 ENCOUNTER — Other Ambulatory Visit: Payer: Self-pay

## 2019-11-08 DIAGNOSIS — Z8673 Personal history of transient ischemic attack (TIA), and cerebral infarction without residual deficits: Secondary | ICD-10-CM

## 2019-11-08 DIAGNOSIS — F0390 Unspecified dementia without behavioral disturbance: Secondary | ICD-10-CM

## 2019-11-08 DIAGNOSIS — H53469 Homonymous bilateral field defects, unspecified side: Secondary | ICD-10-CM

## 2019-11-08 DIAGNOSIS — Z982 Presence of cerebrospinal fluid drainage device: Secondary | ICD-10-CM

## 2019-11-08 DIAGNOSIS — K219 Gastro-esophageal reflux disease without esophagitis: Secondary | ICD-10-CM

## 2019-11-08 DIAGNOSIS — I69891 Dysphagia following other cerebrovascular disease: Secondary | ICD-10-CM | POA: Insufficient documentation

## 2019-11-08 DIAGNOSIS — F331 Major depressive disorder, recurrent, moderate: Secondary | ICD-10-CM | POA: Diagnosis not present

## 2019-11-08 DIAGNOSIS — R4189 Other symptoms and signs involving cognitive functions and awareness: Secondary | ICD-10-CM

## 2019-11-08 DIAGNOSIS — R519 Headache, unspecified: Secondary | ICD-10-CM | POA: Insufficient documentation

## 2019-11-08 HISTORY — DX: Unspecified dementia without behavioral disturbance: F03.90

## 2019-11-08 HISTORY — DX: Unspecified dementia, unspecified severity, without behavioral disturbance, psychotic disturbance, mood disturbance, and anxiety: F03.90

## 2019-11-08 NOTE — Progress Notes (Signed)
° °  Psychometrician Note   Cognitive testing was administered to Patrick Franklin by Wallace Keller, B.S. (psychometrist) under the supervision of Dr. Newman Nickels, Ph.D., licensed psychologist on 11/08/19. [name] did not appear overtly distressed by the testing session per behavioral observation or responses across self-report questionnaires. Dr. Newman Nickels, Ph.D. checked in with Patrick Franklin as needed to manage any distress related to testing procedures (if applicable). Rest breaks were offered.    The battery of tests administered was selected by Dr. Newman Nickels, Ph.D. with consideration to Patrick Franklin current level of functioning, the nature of his symptoms, emotional and behavioral responses during interview, level of literacy, observed level of motivation/effort, and the nature of the referral question. This battery was communicated to the psychometrist. Communication between Dr. Newman Nickels, Ph.D. and the psychometrist was ongoing throughout the evaluation and Dr. Newman Nickels, Ph.D. was immediately accessible at all times. Dr. Newman Nickels, Ph.D. provided supervision to the psychometrist on the date of this service to the extent necessary to assure the quality of all services provided.    Patrick Franklin will return within approximately 1-2 weeks for an interactive feedback session with Dr. Milbert Coulter at which time his test performances, clinical impressions, and treatment recommendations will be reviewed in detail. Patrick Franklin understands he can contact our office should he require our assistance before this time.  A total of 165 minutes of billable time were spent face-to-face with Patrick Franklin by the psychometrist. This includes both test administration and scoring time. Billing for these services is reflected in the clinical report generated by Dr. Newman Nickels, Ph.D..  This note reflects time spent with the psychometrician and does not include test scores or any clinical  interpretations made by Dr. Milbert Coulter. The full report will follow in a separate note.

## 2019-11-08 NOTE — Progress Notes (Signed)
NEUROPSYCHOLOGICAL EVALUATION [place] Department of Neurology  Date of Evaluation: November 08, 2019  Reason for Referral:   Patrick Franklin is a 49 y.o. right-handed Caucasian male referred by Patrcia Dolly, M.D., to characterize his current cognitive functioning and assist with diagnostic clarity and treatment planning in the context of cognitive deficits stemming from two previous cerebrovascular events.   Assessment and Plan:   Clinical Impression(s): Patrick Franklin pattern of performance is suggestive of significant impairment across several areas of cognitive functioning including processing speed, working memory, executive functioning, verbal fluency, and encoding (i.e., learning) aspects of memory. Additional variability was exhibited across receptive language and retrieval/consolidation aspects of memory. Performance was appropriate across basic attention and confrontation naming. Patrick Franklin currently resides in a LTAC facility in [facility] where he has assistance with medication management and administration. His family also provides assistance with financial management. 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  Exhibited cognitive weaknesses are directly related to his stroke history (i.e., major vascular neurocognitive disorder). Extensive damage to the right hemisphere stemming from his 2017 stroke explains [name] most prominent deficits across visuospatial abilities. Deficits in processing speed, working memory, executive functioning, and learning and memory are also common in primary vascular etiologies and impairments would be expected to be quite significant given the size of his right MCA stroke. Deficits in language abilities, as well as other areas described above, would also be  consistent with his smaller left MCA stroke in 2020. I cannot determine which stroke was more impactful via cognitive testing; however, this would certainly be his 2017 stroke based solely upon neuroimaging. His current presentation is likely due to an accumulation of both these neurological events. I do not see evidence for an underlying neurodegenerative condition at the present time. However, his stroke history could place him at an increased risk for this in the future. As such, continued medical monitoring will be important moving forward.  Recommendations: Primary vascular etiologies can be relatively stable over time. Should Patrick Franklin or his family express concerns surrounding gradual cognitive or functional decline over time, a repeat evaluation may be warranted at that time.   A combination of medication and psychotherapy has been shown to be most effective at treating symptoms of anxiety and depression. As such, Patrick Franklin is encouraged to speak with his prescribing physician regarding medication adjustments to optimally manage these symptoms. Likewise, Patrick Franklin could consider engaging in short-term psychotherapy to address symptoms of psychiatric distress. He would benefit from an active and collaborative therapeutic environment, rather than one purely supportive in nature. Recommended treatment modalities include Cognitive Behavioral Therapy (CBT) or Acceptance and Commitment Therapy (ACT).  It will be important for Patrick Franklin to have another person with him when in situations where he may need to process information, weigh the pros and cons of different options, and make decisions, in order to ensure that he fully understands and recalls all information to be considered. With assistance from others, I do feel that he should remain involved in his ongoing treatment and care.   Patrick Franklin is encouraged to attend to lifestyle factors for brain health (e.g., regular physical exercise, good nutrition  habits, regular participation in cognitively-stimulating activities, and general stress management techniques), which are likely to have benefits for both emotional adjustment and cognition. In fact, in addition to promoting good general health,  regular exercise incorporating aerobic activities (e.g., brisk walking, jogging, cycling, etc.) has been demonstrated to be a very effective treatment for depression and stress, with similar efficacy rates to both antidepressant medication and psychotherapy. Optimal control of vascular risk factors (including safe cardiovascular exercise and adherence to dietary recommendations) is encouraged. Continued participation in activities which provide mental stimulation and social interaction is also recommended.   If interested, there are some activities which have therapeutic value and can be useful in keeping him cognitively stimulated. For suggestions, Patrick Franklin is encouraged to go to the following website: https://www.[facility].org/get-to-know-barrow/centers-programs/neurorehabilitation-center/neuro-rehab-apps-and-games/ which has options, categorized by level of difficulty. It should be noted that these activities should not be viewed as a substitute for therapy.  When learning new information, he would benefit from information being broken up into small, manageable pieces. He may also find it helpful to articulate the material in his own words and in a context to promote encoding at the onset of a new task. This material may need to be repeated multiple times to promote encoding.  Memory can be improved using internal strategies such as rehearsal, repetition, chunking, mnemonics, association, and imagery. External strategies such as written notes in a consistently used memory journal, visual and nonverbal auditory cues such as a calendar on the refrigerator or appointments with alarm, such as on a cell phone, can also help maximize recall.    To address problems  with processing speed, he may wish to consider:   -Ensuring that he is alerted when essential material or instructions are being presented   -Adjusting the speed at which new information is presented   -Allowing for more time in comprehending, processing, and responding in conversation  To address problems with working memory and executive dysfunction, he may wish to consider:   -Avoiding external distractions when needing to concentrate   -Limiting exposure to fast paced environments with multiple sensory demands   -Writing down complicated information and using checklists   -Attempting and completing one task at a time (i.e., no multi-tasking)   -Verbalizing aloud each step of a task to maintain focus   -Taking frequent breaks during the completion of steps/tasks to avoid fatigue   -Reducing the amount of information considered at one time  Review of Records:   Patrick Franklin was seen by [facility] Marland KitchenPatrcia Dolly, M.D.) on 10/17/2019 for follow-up. Briefly, he and his sister reported that he had a right MCA stroke s/p hemicraniectomy in December 2017. He has had three seizures since then. The first occurred during the summer of 2018 with no prior warning. He woke up with sore muscles, tongue bite, and urinary incontinence. The second seizure occurred in February 2019, also while asleep, where his wife reported extended extremities with tongue bite. His most recent seizure was said to have occurred on 04/23/2017. His sister reported that they were eating when he slumped forward, his arms stiffened, and shaking occurred for two minutes. She also noted that his left arm was drawn up, he was unresponsive, and he exhibited a left head turn. After about 2-3 minutes, he was able to answer their questions. He had a lot of vomiting post-seizure. He also reported increased pressure behind his eyes during seizure activity. He denied any missed medications or sleep deprivation, but there has been a lot of stress  due to time spent away from his wife.  He was admitted to [facility] in September 2020 when he started having left-gaze deviation and was aphasic. 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 He was evaluated by Neurology and after discussion with family, carotid intervention/embolic workup was not pursued. EEG showed generalized delta slowing, maximal right hemisphere. CT perfusion was attempted twice without meaningful images obtained. Repeat head CT was concerning for an evolving left hemispheric hypodensity, likely a new left temporal occipital stroke. He was discharged to rehab on aspirin  daily. Vimpat  BID and Depakote  BID were continued, with note of last seizure in June 2020. He has been in an LTAC facility since his second stroke. He now has bowel/bladder incontinence. He was started on Topiramate  qhs for headache prophylaxis; he reported fair control. He described his memory as "fair sometimes." His sister provided additional information via text regarding concerns of visual changes, hostility towards daughter, delusional thinking wanting to buy a truck, and believing his son is bilingual and traveling to [place]. He has also had sexually inappropriate behavior towards a staff member. He has had two falls this year due to falling out of bed; he does not remember this.  Ultimately, Patrick Franklin was referred for a comprehensive neuropsychological evaluation to characterize his cognitive abilities and to assist with diagnostic clarity and treatment planning.   Head CT on 04/23/2017 revealed complete right MCA territory encephalomalacia, as well as a left parietal approach shunt catheter tip in the lateral ventricle. Hydrocephalus was not noted. Head CT on 10/01/2018 revealed extensive and stable encephalomalacia related to a chronic right MCA territory infarct. Head CT on 10/02/2018 revealed a focal region of cytotoxic edema involving  the posterior left temporal occipital region consisting with an evolving posterior left MCA territory infarct. Head CT on 03/07/2019 revealed stable right-sided damage, as well as mild encephalomalacia changes in the left side related to a prior, second infarct. Head CT on 10/19/2019 was stable relative to previous findings.   Past Medical History:  Diagnosis Date  . 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 Asthma   . Cardiomegaly 04/13/2016  . Cerebral embolism with cerebral infarction 10/03/2018  . DVT (deep venous thrombosis) (HCC)   . Dysphagia following other cerebrovascular disease   . Elevated serum creatinine   . Essential hypertension 05/15/2017  . GERD without esophagitis   . Headache   . Hemiparesis affecting left side as late effect of stroke 08/09/2017  . History of ischemic right MCA stroke 2017  . History of left MCA stroke 2020  . History of renal stone 08/22/2017   Stone analysis - calcium stone (08/2017) 8 non obstructing L kidney by CT - will refer to urology (08/2017)  . Homonymous bilateral field defects   . Hyperlipidemia 05/15/2017  . MDD (major depressive disorder) 05/15/2017  . Neurogenic bladder   . Presence of cerebrospinal fluid drainage device   . Pulmonary embolism 01/13/2016  . Seizure disorder 08/09/2017  . Spastic hemiplegia affecting nondominant side 03/26/2018  . Subluxation of shoulder joint 04/13/2016   left  . Thrombocytopenia 10/01/2018  . UTI (urinary tract infection)     Past Surgical History:  Procedure Laterality Date  . CRANIECTOMY  2017  . CRANIOPLASTY  2018  . IVC FILTER INSERTION  2017  . PEG TUBE PLACEMENT  2017  . VENTRICULOPERITONEAL SHUNT  2018    Current Outpatient Medications:  .  acetaminophen (TYLENOL) 325 MG tablet, Take 2 tablets (650 mg total) by mouth every 4 (four) hours as needed for mild pain (or temp > 37.5 C (99.5 F))., Disp:  , Rfl:  .  aspirin EC 81 MG tablet, Take 81 mg by mouth daily., Disp: , Rfl:  .  atorvastatin  (LIPITOR) 80 MG tablet, TAKE 1 TABLET BY MOUTH ONCE DAILY AT BEDTIME, Disp: 90 tablet, Rfl: 2 .  Baclofen 5 MG TABS, , Disp: , Rfl:  .  Cholecalciferol (VITAMIN D3) 10 MCG (400 UNIT) CAPS, Take by mouth., Disp: , Rfl:  .  Cholecalciferol (VITAMIN D3) 5000 units CAPS, Take 5,000 Units by mouth daily., Disp: , Rfl:  .  divalproex (DEPAKOTE) 500 MG DR tablet, Take 2 tablets (1,000 mg total) by mouth every 12 (twelve) hours., Disp:  , Rfl:  .  DULoxetine (CYMBALTA) 30 MG capsule, Take 1 capsule every night (Take with 60mg  capsule for total of 90mg  every night), Disp: 30 capsule, Rfl: 11 .  DULoxetine (CYMBALTA) 60 MG capsule, Take 1 capsule every night (take with 30mg  capsule for total of 90mg  every night), Disp: 30 capsule, Rfl: 11 .  lacosamide (VIMPAT) 200 MG TABS tablet, Take 1 tablet (200 mg total) by mouth 2 (two) times daily., Disp: 180 tablet, Rfl: 3 .  LORazepam (ATIVAN) 2 MG/ML injection, , Disp: , Rfl:  .  metoprolol tartrate (LOPRESSOR) 25 MG tablet, TAKE 1 TABLET BY MOUTH TWICE A DAY, Disp: 180 tablet, Rfl: 2 .  metoprolol tartrate (LOPRESSOR) 25 MG tablet, SMARTSIG:Tablet(s) By Mouth, Disp: , Rfl:  .  Multiple Vitamin (MULTIVITAMIN ADULT PO), SMARTSIG:1 Tablet(s) By Mouth Daily, Disp: , Rfl:  .  Multiple Vitamin (MULTIVITAMIN WITH MINERALS) TABS tablet, Take 1 tablet by mouth daily., Disp: , Rfl:  .  PAXIL 10 MG tablet, Take 10 mg by mouth daily., Disp: , Rfl:  .  topiramate (TOPAMAX) 25 MG tablet, Take 1 tablet (25 mg total) by mouth at bedtime., Disp: 5 tablet, Rfl: 0  Clinical Interview:   The following information was obtained during a clinical interview with [name] and his sister prior to cognitive testing.  Cognitive Symptoms: Decreased short-term memory: Endorsed. He reported notable deficits in short-term memory, ongoing since his initial stroke. More specifically, he acknowledged trouble recalling the details of previous conversations, getting dates and appointments mixed  up, and trouble recalling the names of the nurses who care for him regularly.  Decreased long-term memory: Denied. Decreased attention/concentration: Endorsed. She reported trouble with sustained attention, as well as increased distractibility. His sister additionally reported these deficits during interview.  Reduced processing speed: Endorsed. He described his mental processing feeling slowed and foggy.  Difficulties with executive functions: Denied. However, his sister did report trouble with impulsivity; no examples were provided. She also described personality changes following his initial stroke, generally in the form of increased hostility and irritability, as well as some anger regulation difficulties. Trouble with organization or indecision were denied.  Difficulties with emotion regulation: Denied. Difficulties with receptive language: Endorsed. His sister noted that he will occasionally get confused when being asked questions, even when they require only a yes/no response.  Difficulties with word finding: Denied. Decreased visuoperceptual ability: Denied. However, his sister did report significant peripheral vision loss and likely ongoing symptoms of hemi-neglect.   Trajectory of deficits: Cognitive deficits were said to stem from his initial stroke in 2017. They were then exacerbated by his second stroke in 2020. Cognitive deficits were not present prior to his 2017 stroke.   Difficulties completing ADLs: Endorsed. He [place], moving in after his second stroke. Following his initial stroke, he reportedly moved in with his sister  and brother-in-law. He has assistance with medication management and administration via his nursing team. His family also provides assistance with financial management. He is not currently driving due to cognitive and physical effects of his two strokes.   Additional Medical History: History of traumatic brain  injury/concussion: Denied. History of stroke: Endorsed (see above).  History of seizure activity: Endorsed (see above). He and his sister confirmed that no seizure activity has been present since June 2020 and that medications appear to be effective at managing these symptoms.  History of known exposure to toxins: Denied. Symptoms of chronic pain: Denied. Experience of frequent headaches/migraines: Endorsed. Headache symptoms were said to occur "all the time." However, medication interventions (i.e., Tylenol, Topamax) were said to be helpful at alleviating symptoms when they occur. Symptoms were described as a sharp stabbing sensation, generally in the right fronto-temporal area and last for 10-15 minutes at a time.  Frequent instances of dizziness/vertigo: Endorsed. Trouble with feeling dizzy or lightheaded was said to occur 4-5 times each day. He also described less frequent episodes of vertigo, including a recent episode which he was briefly brought to the ED to be evaluated.   Sensory changes: As stated previously, his sister reported significant peripheral vision loss on the left side. They also described ongoing dry eye symptoms and will be visiting the eye doctor to assess the benefits of prescribed eye drops. Patrick Franklin reported being able to see and read fine. Other sensory changes/difficulties (e.g., hearing, taste, or smell) were denied.  Balance/coordination difficulties: N/A. These were unable to be assessed as Patrick Franklin arrived in a wheelchair and has residual left-sided hemiparesis since his initial stroke.  Other motor difficulties: Denied.  Sleep History: Estimated hours obtained each night: 7 hours. He described his sleep as "not so good." Difficulties falling asleep: Endorsed. Difficulties staying asleep: Endorsed. His sister reported that his sleep habits seem to vary, but that there will be times where they receive messages from Patrick Franklin around midnight or 1:00am, suggesting ongoing  deficits falling and staying asleep.  Feels rested and refreshed upon awakening: Denied. He reported often taking a nap following both breakfast and lunch. These naps can sometimes last 3-4 hours.   History of snoring: Endorsed. History of waking up gasping for air: Denied. Witnessed breath cessation while asleep: Denied.  History of vivid dreaming: Denied. Excessive movement while asleep: Denied. Instances of acting out his dreams: Denied.  Psychiatric/Behavioral Health History: Depression: Mr. Beamer reported that he did not recall ever being diagnosed with depression in the past. However, his sister was nodding her head while he was answering this question, suggesting that there likely is a history of depression. Medical records also suggest a prior diagnosis of major depressive disorder. Mr. Oregel described his current mood as "fair." He reported engaging in unspecified counseling services prior to his stroke, as well as services following his initial stroke for a short time. He acknowledged ongoing suicidal ideation since his stroke. Most recent thoughts were said to be present about a month prior. However, he denied ever developing a plan or having an intent to act upon these thoughts.  Anxiety: Mr. Janssen stated that he didn't think he "had anything to be anxious about." However, his sister did note that he likely has generalized symptoms of anxiety throughout the day.  Mania: Denied. Trauma History: Denied. Visual/auditory hallucinations: Denied. Delusional thoughts: Denied.  Tobacco: Denied. He reported using chewing tobacco in the past. Alcohol: He denied current alcohol consumption as well as a  history of problematic alcohol abuse or dependence.  Recreational drugs: Denied. Caffeine: One cup of coffee in the morning. He reported previously consuming several [employer] beverages each day; however, he has significantly cut back.  Family History: Problem Relation Age of Onset  . Stroke  Maternal Uncle   . Stroke Maternal Uncle    This information was confirmed by Patrick Franklin.  Academic/Vocational History: Highest level of educational attainment: 14 years. He graduated from high school and earned an Associate's degree. He described himself as a good (mostly A) student in academic settings. English was said to likely represent a relative weakness.  History of developmental delay: Denied. History of grade repetition: Denied. Enrollment in special education courses: Denied. History of LD/ADHD: Denied.  Employment: He currently receives disability benefits given his stroke history. Previously, he worked as a Insurance account manager, as well as an Teacher, adult education.   Evaluation Results:   Behavioral Observations: Mr. Sciarra was accompanied by his sister, arrived to his appointment on time, and was appropriately dressed and groomed. He appeared alert and oriented. Observed gait and station were unable to be assessed as he ambulated via a wheelchair pushed by his sister. Gross motor functioning appeared intact upon informal observation and no abnormal movements (e.g., tremors) were noted. Hemiparesis on the left side of his body was observed. His affect was generally flat, but did range appropriately given the subject being discussed during the clinical interview or the task at hand during testing procedures. Spontaneous speech was fluent and word finding difficulties were not observed during the clinical interview. Thought processes were coherent, organized, and normal in content. Insight into his cognitive difficulties appeared adequate. During testing, sustained attention was appropriate. Task engagement was adequate and he persisted when challenged. He did require instruction repetition and clarification, generally across more complex tasks. Overall, Patrick Franklin was cooperative with the clinical interview and subsequent testing procedures.   Adequacy of Effort: The validity of  neuropsychological testing is limited by the extent to which the individual being tested may be assumed to have exerted adequate effort during testing. Patrick Franklin expressed his intention to perform to the best of his abilities and exhibited adequate task engagement and persistence. Scores across stand-alone and embedded performance validity measures were variable but largely within expectation. Across Dot Counting, he utilized an odd and poor strategy potentially due to visual field defects where he counted dots on each side of the page separately and then attempted to add these values together. This strategy likely resulted in his below expectation performance. Overall, the results of the current evaluation are believed to be a valid representation of Mr. Karl current cognitive functioning.  Test Results: Mr. Chavarin was poorly oriented at the time of the current evaluation. He was unable to recall his street address (he does live in a LTAC facility) or phone number. He also incorrectly stated the current date, day of the week, and location.   Intellectual abilities based upon educational and vocational attainment were estimated to be in the average range. Premorbid abilities were estimated to be within the below average range based upon a single-word reading test.   Processing speed was exceptionally low. Basic attention was well below average to average. More complex attention (e.g., working memory) was well below average. Executive functioning was exceptionally low to below average. He also performed in the well below average range across a task assessing safety and judgment.   Assessed receptive language abilities were well below average. He appeared to have difficulty  with complex sentence structure, as well as when being asked to perform multi-step actions. Despite this, he was generally able to understand task instructions during testing. He did require some clarification across more complex measures.  Assessed expressive language was mildly variable. Phonemic fluency was well below average, semantic fluency was exceptionally low to well below average, and confrontation naming was below average to average.    Assessed visuospatial/visuoconstructional abilities were exceptionally low to well below average.    Learning (i.e., encoding) of novel verbal information was exceptionally low. Spontaneous delayed recall (i.e., retrieval) of previously learned information was exceptionally low to below average. Retention rates were 117% across a story learning task, 25% (raw score of 1) across a list learning task, and 100% across a complex figure drawing task. Performance across recognition tasks was well below average to average, suggesting some evidence for information consolidation.   Results of emotional screening instruments suggested that recent symptoms of generalized anxiety were in the minimal range, while symptoms of depression were within the moderate range. A screening instrument assessing recent sleep quality suggested the presence of moderate sleep dysfunction.  Tables of Scores:   Note: This summary of test scores accompanies the interpretive report and should not be considered in isolation without reference to the appropriate sections in the text. Descriptors are based on appropriate normative data and may be adjusted based on clinical judgment. The terms "impaired" and "within normal limits (WNL)" are used when a more specific level of functioning cannot be determined.       Effort Testing:   DESCRIPTOR       ACS Word Choice: --- --- Within Expectation  Dot Counting Test: --- --- Below Expectation  RBANS Effort Index: --- --- Within Expectation  WAIS-IV Reliable Digit Span: --- --- Below Expectation       Orientation:      Raw Score Percentile   NAB Orientation, Form 1 19/29 --- ---       Cognitive Screening:           Raw Score Percentile   SLUMS: 13/30 --- ---       RBANS, Form  A: Standard Score/ Scaled Score Percentile   Total Score 54 <1 Exceptionally Low  Immediate Memory 53 <1 Exceptionally Low    List Learning 2 <1 Exceptionally Low    Story Memory 3 1 Exceptionally Low  Visuospatial/Constructional 53 <1 Exceptionally Low    Figure Copy 1 <1 Exceptionally Low    Line Orientation 7/20 <2 Exceptionally Low  Language 83 13 Below Average    Picture Naming 9/10 17-25 Below Average to Average    Semantic Fluency 4 2 Well Below Average  Attention 64 1 Exceptionally Low    Digit Span 8 25 Average    Coding 1 <1 Exceptionally Low  Delayed Memory 64 1 Exceptionally Low    List Recall 1/10 <2 Exceptionally Low    List Recognition 18/20 3-9 Well Below Average    Story Recall 7 16 Below Average    Story Recognition 10/12 27-46 Average    Figure Recall 5 5 Well Below Average    Figure Recognition 4/8 9-20 Below Average       Intellectual Functioning:           Standard Score Percentile   Test of Premorbid Functioning: 85 16 Below Average       Attention/Executive Function:          Oral Trail Making Test (OTMT): Raw Score (Z-Score) Percentile  Part A 17 secs., 0 errors (-7.27) <1 Exceptionally Low    Part B Discontinued --- Impaired        Scaled Score Percentile   WAIS-IV Cancellation: 1 <1 Exceptionally Low        Scaled Score Percentile   WAIS-IV Digit Span: 3 1 Exceptionally Low    Forward 5 5 Well Below Average    Backward 5 5 Well Below Average    Sequencing 4 2 Well Below Average        Scaled Score Percentile   WAIS-IV Similarities: 6 9 Below Average       D-KEFS Color-Word Interference Test: Raw Score (Scaled Score) Percentile     Color Naming 48 secs. (1) <1 Exceptionally Low    Word Reading 42 secs. (1) <1 Exceptionally Low    Inhibition 106 secs. (1) <1 Exceptionally Low      Total Errors 1 error (10) 50 Average    Inhibition/Switching 127 secs. (1) <1 Exceptionally Low      Total Errors 5 errors (7) 16 Below Average       D-KEFS  Verbal Fluency Test: Raw Score (Scaled Score) Percentile     Letter Total Correct 22 (5) 5 Well Below Average    Category Total Correct 24 (4) 2 Well Below Average    Category Switching Total Correct 10 (5) 5 Well Below Average    Category Switching Accuracy 9 (7) 16 Below Average      Total Set Loss Errors 0 (13) 84 Above Average      Total Repetition Errors 0 (12) 75 Above Average       NAB Executive Functions Module, Form 1: T Score Percentile     Judgment 36 8 Well Below Average       Language:          Verbal Fluency Test: Raw Score (T Score) Percentile     Phonemic Fluency (FAS) 22 (33) 5 Well Below Average    Animal Fluency 11 (25) 1 Exceptionally Low        NAB Language Module, Form 1: T Score Percentile     Auditory Comprehension 32 4 Well Below Average    Naming 31/31 (53) 62 Average       Visuospatial/Visuoconstruction:      Raw Score Percentile   Clock Drawing: 5/10 --- Impaired       NAB Spatial Module, Form 1: T Score Percentile     Visual Discrimination 19 <1 Exceptionally Low        Scaled Score Percentile   WAIS-IV Matrix Reasoning: 4 2 Well Below Average  WAIS-IV Visual Puzzles: 4 2 Well Below Average       Mood and Personality:      Raw Score Percentile   Beck Depression Inventory - II: 21 --- Moderate  PROMIS Anxiety Questionnaire: 7 --- None to Slight       Additional Questionnaires:      Raw Score Percentile   PROMIS Sleep Disturbance Questionnaire: 35 --- Moderate   Informed Consent and Coding/Compliance:   Mr. Winterton was provided with a verbal description of the nature and purpose of the present neuropsychological evaluation. Also reviewed were the foreseeable risks and/or discomforts and benefits of the procedure, limits of confidentiality, and mandatory reporting requirements of this provider. The patient was given the opportunity to ask questions and receive answers about the evaluation. Oral consent to participate was provided by the patient.    This evaluation was conducted by Earna Coder C.  Milbert Coulter, Ph.D., licensed clinical neuropsychologist. Mr. Eslick completed a comprehensive clinical interview with Dr. Milbert Coulter, billed as one unit [phone], and 165 minutes of cognitive testing and scoring, billed as one unit [phone] and five additional units 96139. Psychometrist Wallace Keller, B.S., assisted Dr. Milbert Coulter with test administration and scoring procedures. As a separate and discrete service, Dr. Milbert Coulter spent a total of 160 minutes in interpretation and report writing billed as one unit [phone] and two units 96133.

## 2019-11-11 ENCOUNTER — Other Ambulatory Visit: Payer: Self-pay

## 2019-11-11 ENCOUNTER — Ambulatory Visit (INDEPENDENT_AMBULATORY_CARE_PROVIDER_SITE_OTHER): Payer: Medicare Other | Admitting: Psychology

## 2019-11-11 DIAGNOSIS — F0390 Unspecified dementia without behavioral disturbance: Secondary | ICD-10-CM

## 2019-11-11 NOTE — Patient Instructions (Signed)
Primary vascular etiologies can be relatively stable over time. Should Mr. Sinn or his family express concerns surrounding gradual cognitive or functional decline over time, a repeat evaluation may be warranted at that time.   A combination of medication and psychotherapy has been shown to be most effective at treating symptoms of anxiety and depression. As such, Mr. Klemp is encouraged to speak with his prescribing physician regarding medication adjustments to optimally manage these symptoms. Likewise, Mr. Maynez could consider engaging in short-term psychotherapy to address symptoms of psychiatric distress. He would benefit from an active and collaborative therapeutic environment, rather than one purely supportive in nature. Recommended treatment modalities include Cognitive Behavioral Therapy (CBT) or Acceptance and Commitment Therapy (ACT).  It will be important for Mr. Croswell to have another person with him when in situations where he may need to process information, weigh the pros and cons of different options, and make decisions, in order to ensure that he fully understands and recalls all information to be considered. With assistance from others, I do feel that he should remain involved in his ongoing treatment and care.   Mr. Delfavero is encouraged to attend to lifestyle factors for brain health (e.g., regular physical exercise, good nutrition habits, regular participation in cognitively-stimulating activities, and general stress management techniques), which are likely to have benefits for both emotional adjustment and cognition. In fact, in addition to promoting good general health, regular exercise incorporating aerobic activities (e.g., brisk walking, jogging, cycling, etc.) has been demonstrated to be a very effective treatment for depression and stress, with similar efficacy rates to both antidepressant medication and psychotherapy. Optimal control of vascular risk factors (including safe  cardiovascular exercise and adherence to dietary recommendations) is encouraged. Continued participation in activities which provide mental stimulation and social interaction is also recommended.   If interested, there are some activities which have therapeutic value and can be useful in keeping him cognitively stimulated. For suggestions, Mr. Baumgartner is encouraged to go to the following website: https://www.[facility].org/get-to-know-barrow/centers-programs/neurorehabilitation-center/neuro-rehab-apps-and-games/ which has options, categorized by level of difficulty. It should be noted that these activities should not be viewed as a substitute for therapy.  When learning new information, he would benefit from information being broken up into small, manageable pieces. He may also find it helpful to articulate the material in his own words and in a context to promote encoding at the onset of a new task. This material may need to be repeated multiple times to promote encoding.  Memory can be improved using internal strategies such as rehearsal, repetition, chunking, mnemonics, association, and imagery. External strategies such as written notes in a consistently used memory journal, visual and nonverbal auditory cues such as a calendar on the refrigerator or appointments with alarm, such as on a cell phone, can also help maximize recall.    To address problems with processing speed, he may wish to consider:   -Ensuring that he is alerted when essential material or instructions are being presented   -Adjusting the speed at which new information is presented   -Allowing for more time in comprehending, processing, and responding in conversation  To address problems with working memory and executive dysfunction, he may wish to consider:   -Avoiding external distractions when needing to concentrate   -Limiting exposure to fast paced environments with multiple sensory demands   -Writing down complicated  information and using checklists   -Attempting and completing one task at a time (i.e., no multi-tasking)   -Verbalizing aloud each step of a task  to maintain focus   -Taking frequent breaks during the completion of steps/tasks to avoid fatigue   -Reducing the amount of information considered at one time

## 2019-11-11 NOTE — Progress Notes (Signed)
   Neuropsychology Feedback Session Eligha Bridegroom. [facility] [place] Department of Neurology  Reason for Referral:   LENARDO WESTWOOD a 49 y.o. right-handed Caucasian male referred by Patrcia Dolly, M.D.,to characterize hiscurrent cognitive functioning and assist with diagnostic clarity and treatment planning in the context of cognitive deficits stemming from two previous cerebrovascular events.   Feedback:   Mr. Chesnut completed a comprehensive neuropsychological evaluation on 11/08/2019. Please refer to that encounter for the full report and recommendations. Briefly, results suggested significant impairment across several areas of cognitive functioning including processing speed, working memory, executive functioning, verbal fluency, and encoding (i.e., learning) aspects of memory. Additional variability was exhibited across receptive language and retrieval/consolidation aspects of memory. Exhibited cognitive weaknesses are directly related to his stroke history (i.e., major vascular neurocognitive disorder). Extensive damage to the right hemisphere stemming from his 2017 stroke explains Mr. Jackson most prominent deficits across visuospatial abilities. Deficits in processing speed, working memory, executive functioning, and learning and memory are also common in primary vascular etiologies and impairments would be expected to be quite significant given the size of his right MCA stroke. Deficits in language abilities, as well as other areas described above, would also be consistent with his smaller left MCA stroke in 2020. I cannot determine which stroke was more impactful via cognitive testing; however, this would certainly be his 2017 stroke based solely upon neuroimaging. His current presentation is likely due to an accumulation of both these neurological events. I do not see evidence for an underlying neurodegenerative condition at the present time.  Feedback was conducted with Mr.  Swor sister [name] over the telephone. She was within her residence while I was within my office. I discussed the limitations of evaluation and management by telemedicine and the availability of in person appointments. [name] expressed her understanding and agreed to proceed. Content of the current session focused on the results of Mr. Haag neuropsychological evaluation. [name] was given the opportunity to ask questions and her questions were answered. She was encouraged to reach out should additional questions arise. A copy of his report was mailed to Mr. Marano at the conclusion of the previous visit. His report is also available to both he and [name] on [place].      Less than 16 minutes were spent conducting the current feedback session with Mr. Bedel.

## 2019-11-13 ENCOUNTER — Encounter: Payer: Medicare Other | Admitting: Psychology

## 2019-12-03 ENCOUNTER — Encounter: Payer: Medicare Other | Admitting: Psychology

## 2019-12-10 ENCOUNTER — Encounter: Payer: Medicare Other | Admitting: Psychology

## 2020-02-10 ENCOUNTER — Encounter: Payer: Self-pay | Admitting: Neurology

## 2020-02-10 ENCOUNTER — Other Ambulatory Visit: Payer: Self-pay

## 2020-02-10 ENCOUNTER — Telehealth (INDEPENDENT_AMBULATORY_CARE_PROVIDER_SITE_OTHER): Payer: Medicare Other | Admitting: Neurology

## 2020-02-10 VITALS — Wt 204.0 lb

## 2020-02-10 DIAGNOSIS — F0390 Unspecified dementia without behavioral disturbance: Secondary | ICD-10-CM

## 2020-02-10 DIAGNOSIS — F320 Major depressive disorder, single episode, mild: Secondary | ICD-10-CM | POA: Diagnosis not present

## 2020-02-10 DIAGNOSIS — I639 Cerebral infarction, unspecified: Secondary | ICD-10-CM

## 2020-02-10 DIAGNOSIS — G40209 Localization-related (focal) (partial) symptomatic epilepsy and epileptic syndromes with complex partial seizures, not intractable, without status epilepticus: Secondary | ICD-10-CM | POA: Diagnosis not present

## 2020-02-10 DIAGNOSIS — F3342 Major depressive disorder, recurrent, in full remission: Secondary | ICD-10-CM

## 2020-02-10 NOTE — Progress Notes (Signed)
Virtual Visit via Video Note The purpose of this virtual visit is to provide medical care while limiting exposure to the novel coronavirus.    Consent was obtained for video visit:  Yes.   Answered questions that patient had about telehealth interaction:  Yes.   I discussed the limitations, risks, security and privacy concerns of performing an evaluation and management service by telemedicine. I also discussed with the patient that there may be a patient responsible charge related to this service. The patient expressed understanding and agreed to proceed.  Pt location: Home Physician Location: office Name of referring provider:  Charlott Rakes, MD I connected with Patrick Franklin at patients initiation/request on 02/10/2020 at  2:30 PM EST by video enabled telemedicine application and verified that I am speaking with the correct person using two identifiers. Pt MRN:  [id number] Pt DOB:  30-Sep-1970 Video Participants:  Patrick Franklin;  Willette Pa (sister)   History of Present Illness:  The patient had a virtual video visit on 02/11/2020. 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 His sister is present during the visit to provide additional information. They deny any seizures since June 2020 on Vimpat 200mg  BID and Depakote 1000mg  BID. He was started on Topiramate 25mg  qhs while admitted for the second stroke in 09/2018 due to headaches. He continues to report frequent headaches that are there mostly all the time, worse when he yawns, sneezes, or takes big bites. Pain is mostly on the vertex and craniotomy site. He reports ear pain. He reports sleep difficulties, with difficulty initiating sleep, then maintaining it as well. He was previously on melatonin but has been off it the past year. He takes naps in the afternoon. On his last visit, also reported more cognitive and behavioral changes.  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 We had increased the Cymbalta to 90mg  qhs which 10/2018 reports has helped, his mood is better, no further behavioral issues. He is working with PT and OT and getting up more, he also has a new roommate that he can have conversations with. No falls.   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 He and his sister report that he had a stroke in December 2017, then has had 3 seizures since then. The first occurred summer 2018 with no prior warning, he woke up with sore muscles, tongue bite and urinary incontinence. The second seizure occurred in February 2019 in his sleep where his wife reported extremities were extended with tongue bite. The most recent one was last 04/23/17 witnessed by his family. His sister reports they were eating then he slumped forward and arms stiffened with shaking for 2 minutes, left arm was drawn up. He was unresponsive with head turn to the left. After 2-3 minutes, he would calm down, then start having movements but able to answer their questions. He had a lot of vomiting after both seizures. He reports a lot of pressure behind his eyes when he had the seizure. He was brought to [facility] ER where CBC and CMP were unremarkable. I personally reviewed head CT without contrast which showed severe right MCA encephalomalacia, left parietal approach shunt catheter tip in left lateral ventricle, no hydrocephalus. There  was a hyperdense focus in the right lateral ventricle, ex vacuo rightward midline shift. He was continued on Vimpat 100mg  BID. He denies any missed medications or sleep deprivation, but there has been a lot of stress with moving to live with his sister and brother-in-law  away from his wife. They have a sitter with him during the day while his family is working.   He has pressure behind his eyes on a daily basis. Eye exam reported as normal. He has some neck pain. He has residual weakness on the left side, denies any numbness/tingling or pain. He denies any olfactory/gustatory hallucinations, deja vu, rising epigastric sensation, myoclonic jerks. Mood is okay. His sister has noticed he stares off at times, but replies when called. Family fills his pillbox for him. He is also taking Ritalin prescribed at rehab because he was having attention difficulties and needed prompting to initiate activities, not progressing with therapy. They feel this has been very helpful for him, his brother-in-law noticed a difference in interaction when he ran out of medication, he was less interactive. He states he does not take it all the time. There is a family history of stroke. No family history of seizures.  Epilepsy Risk Factors:  Complete right MCA territory encephalomalacia s/p right hemicraniectomy. Otherwise he had a normal birth and early development.  There is no history of febrile convulsions, CNS infections such as meningitis/encephalitis, or family history of seizures.    Current Outpatient Medications on File Prior to Visit  Medication Sig Dispense Refill  . acetaminophen (TYLENOL) 325 MG tablet Take 2 tablets (650 mg total) by mouth every 4 (four) hours as needed for mild pain (or temp > 37.5 C (99.5 F)).    aspirin EC 81 MG tablet Take 81 mg by mouth daily.    Marland Kitchen atorvastatin (LIPITOR) 80 MG tablet TAKE 1 TABLET BY MOUTH ONCE DAILY AT BEDTIME 90 tablet 2  . Baclofen 5 MG TABS     . Cholecalciferol (VITAMIN D3) 10 MCG (400 UNIT) CAPS Take by mouth.    . Cholecalciferol (VITAMIN D3) 5000 units CAPS Take 5,000 Units by mouth daily.    . divalproex (DEPAKOTE) 500 MG DR tablet Take 2 tablets (1,000 mg total) by mouth every 12 (twelve) hours.    . DULoxetine (CYMBALTA) 30 MG  capsule Take 1 capsule every night (Take with 60mg  capsule for total of 90mg  every night) 30 capsule 11  . DULoxetine (CYMBALTA) 60 MG capsule Take 1 capsule every night (take with 30mg  capsule for total of 90mg  every night) 30 capsule 11  . lacosamide (VIMPAT) 200 MG TABS tablet Take 1 tablet (200 mg total) by mouth 2 (two) times daily. 180 tablet 3  . LORazepam (ATIVAN) 2 MG/ML injection     . metoprolol tartrate (LOPRESSOR) 25 MG tablet TAKE 1 TABLET BY MOUTH TWICE A DAY 180 tablet 2  . metoprolol tartrate (LOPRESSOR) 25 MG tablet SMARTSIG:Tablet(s) By Mouth    . Multiple Vitamin (MULTIVITAMIN ADULT PO) SMARTSIG:1 Tablet(s) By Mouth Daily    . Multiple Vitamin (MULTIVITAMIN WITH MINERALS) TABS tablet Take 1 tablet by mouth daily.    Marland Kitchen PAXIL 10 MG tablet Take 10 mg by mouth daily.    topiramate (TOPAMAX) 25 MG tablet Take 1 tablet (25 mg total) by mouth at bedtime. 5 tablet 0   No current facility-administered medications on file prior to visit.     Observations/Objective:   Vitals:   02/10/20 1319  Weight: 204 lb (92.5  kg)   GEN:  The patient appears stated age and is in NAD.  Neurological examination: Patient is awake, alert. No aphasia or dysarthria. Cranial nerves: Extraocular movements intact with no nystagmus. Left facial droop. Motor: left hemiparesis.    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 Discussion in the hospital with family was that further embolic workup would not be pursued after goals of care discussion. He is on a daily aspirin, statin. No seizures since June 2020, continue Vimpat 200mg  BID and Depakote 1000mg  BID. He continues to report headaches that appear musculoskeletal, it may also relate to poor sleep hygiene. We will try increasing Topiramate to 50mg  qhs and see if this  helps. Restart melatonin to help with sleep. Mood improved with Cymbalta 90mg  daily. Continue 24/7 care, follow-up in 6 months, they know to call for any changes.    Follow Up Instructions:   -I discussed the assessment and treatment plan with the patient/sister. The patient/sister were provided an opportunity to ask questions and all were answered. The patient/sister agreed with the plan and demonstrated an understanding of the instructions.   The patient/sister were advised to call back or seek an in-person evaluation if the symptoms worsen or if the condition fails to improve as anticipated.    , MD

## 2020-02-11 MED ORDER — LACOSAMIDE 200 MG PO TABS
200.0000 mg | ORAL_TABLET | Freq: Two times a day (BID) | ORAL | 3 refills | Status: DC
Start: 2020-02-11 — End: 2020-06-16

## 2020-02-11 MED ORDER — MELATONIN 10 MG PO TABS: ORAL_TABLET | ORAL | 3 refills | Status: DC

## 2020-02-11 MED ORDER — TOPIRAMATE 50 MG PO TABS: ORAL_TABLET | ORAL | 3 refills | Status: DC

## 2020-02-11 MED ORDER — DULOXETINE HCL 30 MG PO CPEP
ORAL_CAPSULE | ORAL | 3 refills | Status: DC
Start: 2020-02-11 — End: 2022-05-19

## 2020-02-11 MED ORDER — DULOXETINE HCL 60 MG PO CPEP: ORAL_CAPSULE | ORAL | 3 refills | Status: DC

## 2020-02-11 MED ORDER — DIVALPROEX SODIUM 500 MG PO DR TAB
1000.0000 mg | DELAYED_RELEASE_TABLET | Freq: Two times a day (BID) | ORAL | 3 refills | Status: DC
Start: 2020-02-11 — End: 2022-06-08

## 2020-02-12 ENCOUNTER — Other Ambulatory Visit: Payer: Self-pay | Admitting: Neurology

## 2020-06-15 ENCOUNTER — Telehealth: Payer: Self-pay | Admitting: Neurology

## 2020-06-16 ENCOUNTER — Other Ambulatory Visit: Payer: Self-pay | Admitting: Neurology

## 2020-06-16 MED ORDER — LACOSAMIDE 200 MG PO TABS
200.0000 mg | ORAL_TABLET | Freq: Two times a day (BID) | ORAL | 3 refills | Status: DC
Start: 2020-06-16 — End: 2020-06-17

## 2020-06-16 NOTE — Telephone Encounter (Signed)
Can you pls confirm which medication, is it the Vimpat? The reason may be that there is a generic now and they may cover generic Lacosamide.

## 2020-06-16 NOTE — Telephone Encounter (Signed)
Will send in generic Lacosamide prescription and go from there. May need to apply for [employer] Cares program. Thanks

## 2020-06-16 NOTE — Telephone Encounter (Signed)
Please see

## 2020-06-16 NOTE — Telephone Encounter (Signed)
It is the vimpat, please advise and route message to the pool.thanks

## 2020-06-17 ENCOUNTER — Other Ambulatory Visit: Payer: Self-pay

## 2020-06-17 MED ORDER — LACOSAMIDE 200 MG PO TABS
200.0000 mg | ORAL_TABLET | Freq: Two times a day (BID) | ORAL | 3 refills | Status: DC
Start: 2020-06-17 — End: 2020-07-29

## 2020-06-17 NOTE — Telephone Encounter (Signed)
pts is in a facility script faxed to [phone] [facility]

## 2020-07-17 ENCOUNTER — Telehealth: Payer: Self-pay | Admitting: Neurology

## 2020-07-17 NOTE — Telephone Encounter (Signed)
Patrick Franklin - the Director of Nursing at [facility] called in stating the patient's Vimpat is not being covered by his insurance. They need an alternate medication to give him. They would like an order faxed to [phone]

## 2020-07-17 NOTE — Telephone Encounter (Signed)
Seizure medications cannot just be changed, is this Lacosamide or brand Vimpat? Can his sister apply for [employer] Cares, thanks

## 2020-07-17 NOTE — Telephone Encounter (Signed)
Patrick Franklin called no answer left a voice mail stating that Seizure medications cannot just be changed, is this Lacosamide or brand Vimpat? Can his sister apply for [employer], I will fax her the [facility] paper work

## 2020-07-21 NOTE — Telephone Encounter (Addendum)
Shanda Bumps from [employer] called to check on the status of the fax back to [employer]. They need it to serve the patient.  Fax: [phone]

## 2020-07-21 NOTE — Telephone Encounter (Signed)
[facility] paperwork refaxed to [place] at [employer]

## 2020-07-22 NOTE — Telephone Encounter (Signed)
Shanda Bumps called back informed we need page 4 filled out

## 2020-07-29 ENCOUNTER — Other Ambulatory Visit: Payer: Self-pay

## 2020-07-29 MED ORDER — LACOSAMIDE 200 MG PO TABS: 200.0000 mg | ORAL_TABLET | Freq: Two times a day (BID) | ORAL | 3 refills | Status: DC

## 2020-07-29 NOTE — Telephone Encounter (Signed)
[facility] care form faxed

## 2020-08-10 ENCOUNTER — Other Ambulatory Visit: Payer: Self-pay | Admitting: Neurology

## 2020-08-11 ENCOUNTER — Telehealth: Payer: Self-pay

## 2020-08-11 NOTE — Telephone Encounter (Signed)
DON Charisse called to ask about pt Vimpat / generic no answer left a voice mail to call the office back

## 2020-08-12 NOTE — Telephone Encounter (Signed)
Shanda Bumps love called regarding his vimpat, she is returning call [phone]

## 2020-08-12 NOTE — Telephone Encounter (Signed)
Patrick Franklin was called back was sent to the wrong voice mail and unable to leave a voice mail will call back later

## 2020-08-13 NOTE — Telephone Encounter (Signed)
Called Shanda Bumps back got voice mail left a message to call the office back

## 2020-08-14 ENCOUNTER — Other Ambulatory Visit: Payer: Self-pay

## 2020-08-14 MED ORDER — LACOSAMIDE 200 MG PO TABS
200.0000 mg | ORAL_TABLET | Freq: Two times a day (BID) | ORAL | 3 refills | Status: DC
Start: 2020-08-14 — End: 2022-06-08

## 2020-08-14 NOTE — Telephone Encounter (Signed)
Spoke with pt sister she is going to go to the facility and see what is going on with pt medication and see if they got the script for the generic vimpat and see if they will help pay the flat $135 from [facility] of needed for VIMPAT. She stated pt has been out of meds for a month, facility stated he had meds and then said he was out, pt sister is not happy with the facility and stated she will send a mychart message with an update she is going to talk to facility pharmacy and the admin staff to see what has been going on.

## 2020-09-04 ENCOUNTER — Telehealth: Payer: Self-pay | Admitting: Neurology

## 2020-09-09 ENCOUNTER — Ambulatory Visit: Payer: Medicare Other | Admitting: Neurology

## 2020-09-29 ENCOUNTER — Other Ambulatory Visit: Payer: Self-pay

## 2020-09-29 ENCOUNTER — Encounter: Payer: Self-pay | Admitting: Neurology

## 2020-09-29 ENCOUNTER — Ambulatory Visit (INDEPENDENT_AMBULATORY_CARE_PROVIDER_SITE_OTHER): Payer: Medicare Other | Admitting: Neurology

## 2020-09-29 VITALS — HR 96 | Wt 210.0 lb

## 2020-09-29 DIAGNOSIS — I639 Cerebral infarction, unspecified: Secondary | ICD-10-CM

## 2020-09-29 DIAGNOSIS — G40209 Localization-related (focal) (partial) symptomatic epilepsy and epileptic syndromes with complex partial seizures, not intractable, without status epilepticus: Secondary | ICD-10-CM

## 2020-09-29 DIAGNOSIS — F5104 Psychophysiologic insomnia: Secondary | ICD-10-CM

## 2020-09-29 MED ORDER — TRAZODONE HCL 50 MG PO TABS
50.0000 mg | ORAL_TABLET | Freq: Every day | ORAL | 11 refills | Status: DC
Start: 2020-09-29 — End: 2021-03-31

## 2020-09-29 NOTE — Patient Instructions (Addendum)
Start Trazodone 50mg : take 1 tablet every night  2. Continue all your other medications: Lacosamide 200mg  twice a day, Depakote 500mg : take 2 tablets twice a day, and Topiramate 50mg  daily  3. Continue daily aspirin  4. Call Dr. regarding spasticity and possible botox.  5. Follow-up in 6 months, call for any changes

## 2020-09-29 NOTE — Progress Notes (Signed)
NEUROLOGY FOLLOW UP OFFICE NOTE  Patrick Franklin [id number] Jan 07, 1971  HISTORY OF PRESENT ILLNESS: I had the pleasure of seeing Patrick Franklin in follow-up in the neurology clinic on 09/29/2020.  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 He is again accompanied by his sister [name] who helps supplement the history today.  Records and images were personally reviewed where available.  He continues to do well from a seizure standpoint, with no seizures since June 2020. There was a period where he was off the Lacosamide due to insurance coverage, he has been back on it for the past month or so with no seizure recurrence. He is on Lacosamide 200mg  BID and Depakote 1000mg  BID. He was started on Topiramate for headache prophylaxis, currently on 50mg  qhs. He states the headaches are doing good, has not heard him complain as much since increasing dose on last visit. His biggest issue is insomnia. He naps during the day. He wakes up in the middle of the night and totally forgets where he is. This also happens when he naps in the day, he forgets where he is and freaks out. He states his appetite is good, "hit or miss" per . He developed a keloid on the left earlobe after a recent procedure. He had a fall in the past 2 weeks when he tried to transfer by himself, he his his head and back. His roommate called for her. He reports pain in the left arm and leg due to spasticity and continues to do OT. He is on Cymbalta 90mg  qhs for mood and pain.    History on Initial Assessment 05/01/2017: This is a pleasant 50 year old right-handed man with a history of right MCA stroke s/p hemicraniectomy with subsequent seizures, presenting to establish care. He and his sister report that he had a stroke in December 2017, then has had 3 seizures since then. The first occurred summer 2018 with no prior warning, he woke up with sore  muscles, tongue bite and urinary incontinence. The second seizure occurred in February 2019 in his sleep where his wife reported extremities were extended with tongue bite. The most recent one was last 04/23/17 witnessed by his family. His sister reports they were eating then he slumped forward and arms stiffened with shaking for 2 minutes, left arm was drawn up. He was unresponsive with head turn to the left. After 2-3 minutes, he would calm down, then start having movements but able to answer their questions. He had a lot of vomiting after both seizures. He reports a lot of pressure behind his eyes when he had the seizure. He was brought to [facility] ER where CBC and CMP were unremarkable. I personally reviewed head CT without contrast which showed severe right MCA encephalomalacia, left parietal approach shunt catheter tip in left lateral ventricle, no hydrocephalus. There was a hyperdense focus in the right lateral ventricle, ex vacuo rightward midline shift. He was continued on Vimpat 100mg  BID. He denies any missed medications or sleep deprivation, but there has been a lot of stress with moving to live with his sister and brother-in-law away from his wife. They have a sitter with him during the day while his family is working.    He has pressure behind his eyes on a daily basis. Eye exam reported as normal. He has some neck pain. He has residual weakness on the left side, denies any numbness/tingling or pain.  He denies any olfactory/gustatory hallucinations, deja vu, rising epigastric sensation, myoclonic jerks. Mood is okay. His sister has noticed he stares off at times, but replies when called. Family fills his pillbox for him. He is also taking Ritalin prescribed at rehab because he was having attention difficulties and needed prompting to initiate activities, not progressing with therapy. They feel this has been very helpful for him, his brother-in-law noticed a difference in interaction when he ran out of  medication, he was less interactive. He states he does not take it all the time. There is a family history of stroke. No family history of seizures.   Epilepsy Risk Factors:  Complete right MCA territory encephalomalacia s/p right hemicraniectomy. Otherwise he had a normal birth and early development.  There is no history of febrile convulsions, CNS infections such as meningitis/encephalitis, or family history of seizures.  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    PAST MEDICAL HISTORY: Past Medical History:  Diagnosis Date   Aorto-iliac atherosclerosis 08/29/2017   By CT   Asthma    Cardiomegaly 04/13/2016   Cerebral embolism with cerebral infarction 10/03/2018   DVT (deep venous thrombosis) (HCC)    Dysphagia following other cerebrovascular disease    Elevated serum creatinine    Essential hypertension 05/15/2017   GERD without esophagitis    Headache    Hemiparesis affecting left side as late effect of stroke 08/09/2017   History of ischemic right MCA stroke 2017   History of left MCA stroke 2020   History of renal stone 08/22/2017   Stone analysis - calcium stone (08/2017) 8 non obstructing L kidney by CT - will refer to urology (08/2017)   Homonymous bilateral field defects    Hyperlipidemia 05/15/2017   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   MDD (major depressive disorder) 05/15/2017   Neurogenic bladder    Presence of cerebrospinal fluid drainage device    Pulmonary embolism 01/13/2016   Seizure disorder 08/09/2017   Spastic hemiplegia affecting nondominant side 03/26/2018   Subluxation of shoulder joint 04/13/2016   left   Thrombocytopenia 10/01/2018   UTI (urinary tract infection)     MEDICATIONS: Current Outpatient Medications on File Prior to Visit  Medication Sig Dispense Refill   acetaminophen (TYLENOL) 325 MG tablet Take 2 tablets  (650 mg total) by mouth every 4 (four) hours as needed for mild pain (or temp > 37.5 C (99.5 F)).     aspirin EC 81 MG tablet Take 81 mg by mouth daily.     atorvastatin (LIPITOR) 80 MG tablet TAKE 1 TABLET BY MOUTH ONCE DAILY AT BEDTIME 90 tablet 2   Baclofen 5 MG TABS      Cholecalciferol (VITAMIN D3) 10 MCG (400 UNIT) CAPS Take by mouth.     Cholecalciferol (VITAMIN D3) 5000 units CAPS Take 5,000 Units by mouth daily.     divalproex (DEPAKOTE) 500 MG DR tablet Take 2 tablets (1,000 mg total) by mouth every 12 (twelve) hours. 360 tablet 3   DULoxetine (CYMBALTA) 30 MG capsule Take 1 capsule every night (Take with 60mg  capsule for total of 90mg  every night) 90 capsule 3   DULoxetine (CYMBALTA) 60 MG capsule Take 1 capsule every night (take with 30mg  capsule for total of 90mg  every night) 90 capsule 3   lacosamide (VIMPAT) 200 MG TABS tablet Take 1 tablet (200 mg total) by mouth 2 (two) times daily. 180 tablet 3  LORazepam (ATIVAN) 2 MG/ML injection      Melatonin 10 MG TABS Take 1 tablet every night 90 tablet 3   metoprolol tartrate (LOPRESSOR) 25 MG tablet SMARTSIG:Tablet(s) By Mouth     Multiple Vitamin (MULTIVITAMIN ADULT PO) SMARTSIG:1 Tablet(s) By Mouth Daily     PAXIL 10 MG tablet Take 10 mg by mouth daily.     topiramate (TOPAMAX) 50 MG tablet Take 1 tablet daily 90 tablet 3   No current facility-administered medications on file prior to visit.    ALLERGIES: Allergies  Allergen Reactions   Versed [Midazolam] Other (See Comments)    Drops blood pressure and heart rate    FAMILY HISTORY: Family History  Problem Relation Age of Onset   Stroke Maternal Uncle    Stroke Maternal Uncle     SOCIAL HISTORY: Social History   Socioeconomic History   Marital status: Divorced    Spouse name: Not on file   Number of children: Not on file   Years of education: 14   Highest education level: Associate degree: academic program  Occupational History   Occupation: Disability   Tobacco Use   Smoking status: Never   Smokeless tobacco: Former    Types: Associate Professor Use: Never used  Substance and Sexual Activity   Alcohol use: Not Currently   Drug use: Never   Sexual activity: Not on file  Other Topics Concern   Not on file  Social History Narrative   ** Merged History Encounter **       Single. 4 children.  Once worked as an Teacher, adult education.  Enjoys reading.  Lives at [facility] Right [facility]ed   Social Determinants of Health   Financial Resource Strain: Not on file  Food Insecurity: Not on file  Transportation Needs: Not on file  Physical Activity: Not on file  Stress: Not on file  Social Connections: Not on file  Intimate Partner Violence: Not on file     PHYSICAL EXAM: Vitals:   09/29/20 1506  Pulse: 96  SpO2: 97%   General: No acute distress Head:  s/p right cranioplasty Skin/Extremities: No rash, no edema Neurological Exam: alert and awake. No aphasia or dysarthria. Fund of knowledge is reduced.  Recent and remote memory are impaired.  Attention and concentration are normal. Cranial nerves: Pupils equal, round. Extraocular movements intact with no nystagmus. Visual fields full.  Left facial droop. Motor: Bulk and tone increased on left. Muscle strength 5/5 on right UE and LE, 0/5 on left UE and LE. Finger to nose testing intact on right.  Gait not tested.   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 Discussion in the hospital with family was that further embolic workup would not be pursued after goals of care discussion. He is on a daily aspirin, statin. He has been seizure-free since June 2020 on Lacosamide 200mg  BID and Depakote 1000mg  BID. Topiramate 50mg  qhs has helped with headaches. Main concern today is insomnia, he will try Trazodone  50mg  qhs, side effects discussed. Encouraged him to participate in activities during the day and avoid naps to help with sleep hygiene. He is reporting more pain from left-sided spasticity and will be referred to Dr. for evaluation if Botox would be helpful for him. Follow-up in 6 months, call for any  changes.   Thank you for allowing me to participate in his care.  Please do not hesitate to call for any questions or concerns.   Patrcia Dolly, M.D.   CC: Dr. Yetta Flock

## 2021-03-01 ENCOUNTER — Encounter: Payer: Self-pay | Admitting: Neurology

## 2021-03-11 ENCOUNTER — Encounter: Payer: Self-pay | Admitting: Neurology

## 2021-03-31 ENCOUNTER — Encounter: Payer: Self-pay | Admitting: Neurology

## 2021-03-31 ENCOUNTER — Ambulatory Visit (INDEPENDENT_AMBULATORY_CARE_PROVIDER_SITE_OTHER): Payer: Medicare Other | Admitting: Neurology

## 2021-03-31 ENCOUNTER — Other Ambulatory Visit: Payer: Self-pay

## 2021-03-31 VITALS — BP 124/89 | HR 86

## 2021-03-31 DIAGNOSIS — R42 Dizziness and giddiness: Secondary | ICD-10-CM

## 2021-03-31 DIAGNOSIS — G40209 Localization-related (focal) (partial) symptomatic epilepsy and epileptic syndromes with complex partial seizures, not intractable, without status epilepticus: Secondary | ICD-10-CM | POA: Diagnosis not present

## 2021-03-31 DIAGNOSIS — I639 Cerebral infarction, unspecified: Secondary | ICD-10-CM | POA: Diagnosis not present

## 2021-03-31 DIAGNOSIS — F0390 Unspecified dementia without behavioral disturbance: Secondary | ICD-10-CM | POA: Diagnosis not present

## 2021-03-31 NOTE — Progress Notes (Signed)
NEUROLOGY FOLLOW UP OFFICE NOTE  MARIAN DENK [id number] 02/17/1970  HISTORY OF PRESENT ILLNESS: I had the pleasure of seeing Patrick Franklin in follow-up in the neurology clinic on 03/31/2021.  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 He is again accompanied by his sister Patrick Franklin who helps supplement the history today. On his last visit, he was reporting insomnia and was started on Trazodone. Patrick Franklin contacted our office that he was doing much better, getting up in the morning and staying awake during the day, so sleep was better and Trazodone was discontinued. No seizures since June 2020 on Lacosamide 200mg  BID and Depakote 1000mg  BID. He is low dose Topiramate 50mg  qhs for headache prophylaxis, he reports the headaches are not bad. He has been reporting positional dizziness/spinning sensation when he is being rolled in bed by staff. He had a fall Sunday, Patrick Franklin reports she was told he was trying to get the remote control from the bed and slid out. He states he woke up in the middle of the night and was trying to sit up on the edge of the bed, he was getting dizzy and wanting to sit up. He states his short-term memory is bad, he cannot remember staff member names. He was previously reporting more pain due to spasticity on the left side, but did not want to go back to PMR for consideration for Botox. He has been seeing PT/OT and feels it is helping him. He is on Cymbalta 90mg  daily and Sertraline 50mg  daily for mood. He is scheduled for keloid surgery next month.   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 He and his sister report that he had a stroke in December 2017, then has had 3 seizures since then. The first occurred summer 2018 with no prior warning, he  woke up with sore muscles, tongue bite and urinary incontinence. The second seizure occurred in February 2019 in his sleep where his wife reported extremities were extended with tongue bite. The most recent one was last 04/23/17 witnessed by his family. His sister reports they were eating then he slumped forward and arms stiffened with shaking for 2 minutes, left arm was drawn up. He was unresponsive with head turn to the left. After 2-3 minutes, he would calm down, then start having movements but able to answer their questions. He had a lot of vomiting after both seizures. He reports a lot of pressure behind his eyes when he had the seizure. He was brought to [facility] ER where CBC and CMP were unremarkable. I personally reviewed head CT without contrast which showed severe right MCA encephalomalacia, left parietal approach shunt catheter tip in left lateral ventricle, no hydrocephalus. There was a hyperdense focus in the right lateral ventricle, ex vacuo rightward midline shift. He was continued on Vimpat 100mg  BID. He denies any missed medications or sleep deprivation, but there has been a lot of stress with moving to live with his sister and brother-in-law away from his wife. They have a sitter with him during the day while his family is working.    He has pressure behind his eyes on a daily basis. Eye exam reported as normal. He has some neck pain. He has residual weakness on the left side, denies any numbness/tingling or pain. He denies any olfactory/gustatory  hallucinations, deja vu, rising epigastric sensation, myoclonic jerks. Mood is okay. His sister has noticed he stares off at times, but replies when called. Family fills his pillbox for him. He is also taking Ritalin prescribed at rehab because he was having attention difficulties and needed prompting to initiate activities, not progressing with therapy. They feel this has been very helpful for him, his brother-in-law noticed a difference in interaction when  he ran out of medication, he was less interactive. He states he does not take it all the time. There is a family history of stroke. No family history of seizures.   Epilepsy Risk Factors:  Complete right MCA territory encephalomalacia s/p right hemicraniectomy. Otherwise he had a normal birth and early development.  There is no history of febrile convulsions, CNS infections such as meningitis/encephalitis, or family history of seizures.  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    PAST MEDICAL HISTORY: Past Medical History:  Diagnosis Date   Aorto-iliac atherosclerosis 08/29/2017   By CT   Asthma    Cardiomegaly 04/13/2016   Cerebral embolism with cerebral infarction 10/03/2018   DVT (deep venous thrombosis) (HCC)    Dysphagia following other cerebrovascular disease    Elevated serum creatinine    Essential hypertension 05/15/2017   GERD without esophagitis    Headache    Hemiparesis affecting left side as late effect of stroke 08/09/2017   History of ischemic right MCA stroke 2017   History of left MCA stroke 2020   History of renal stone 08/22/2017   Stone analysis - calcium stone (08/2017) 8 non obstructing L kidney by CT - will refer to urology (08/2017)   Homonymous bilateral field defects    Hyperlipidemia 05/15/2017   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   MDD (major depressive disorder) 05/15/2017   Neurogenic bladder    Presence of cerebrospinal fluid drainage device    Pulmonary embolism 01/13/2016   Seizure disorder 08/09/2017   Spastic hemiplegia affecting nondominant side 03/26/2018   Subluxation of shoulder joint 04/13/2016   left   Thrombocytopenia 10/01/2018   UTI (urinary tract infection)     MEDICATIONS: Current Outpatient Medications on File Prior to Visit  Medication Sig Dispense Refill   aspirin EC 81 MG tablet Take 81 mg by  mouth daily.     atorvastatin (LIPITOR) 80 MG tablet TAKE 1 TABLET BY MOUTH ONCE DAILY AT BEDTIME 90 tablet 2   Baclofen 5 MG TABS      divalproex (DEPAKOTE) 500 MG DR tablet Take 2 tablets (1,000 mg total) by mouth every 12 (twelve) hours. 360 tablet 3   DULoxetine (CYMBALTA) 30 MG capsule Take 1 capsule every night (Take with 60mg  capsule for total of 90mg  every night) 90 capsule 3   DULoxetine (CYMBALTA) 60 MG capsule Take 1 capsule every night (take with 30mg  capsule for total of 90mg  every night) 90 capsule 3   lacosamide (VIMPAT) 200 MG TABS tablet Take 1 tablet (200 mg total) by mouth 2 (two) times daily. 180 tablet 3   Melatonin 10 MG TABS Take 1 tablet every night 90 tablet 3   metoprolol tartrate (LOPRESSOR) 25 MG tablet SMARTSIG:Tablet(s) By Mouth     Multiple Vitamin (MULTIVITAMIN ADULT PO) SMARTSIG:1 Tablet(s) By Mouth Daily     Polyethyl Glycol-Propyl Glycol (SYSTANE OP) Apply to eye.     topiramate (TOPAMAX) 50 MG tablet Take 1 tablet daily 90 tablet 3  White Petrolatum-Mineral Oil (LUBRIFRESH P.M. OP) Apply to eye.     ZOLOFT 50 MG tablet Take 50 mg by mouth daily.     No current facility-administered medications on file prior to visit.    ALLERGIES: Allergies  Allergen Reactions   Versed [Midazolam] Other (See Comments)    Drops blood pressure and heart rate    FAMILY HISTORY: Family History  Problem Relation Age of Onset   Stroke Maternal Uncle    Stroke Maternal Uncle     SOCIAL HISTORY: Social History   Socioeconomic History   Marital status: Divorced    Spouse name: Not on file   Number of children: Not on file   Years of education: 14   Highest education level: Associate degree: academic program  Occupational History   Occupation: Disability  Tobacco Use   Smoking status: Never   Smokeless tobacco: Former    Types: Nurse, children's Use: Never used  Substance and Sexual Activity   Alcohol use: Not Currently   Drug use: Never   Sexual  activity: Not on file  Other Topics Concern   Not on file  Social History Narrative   ** Merged History Encounter **       Single. 4 children.  Once worked as an Barrister's clerk.  Enjoys reading.  Lives at [facility] Right Handed   Social Determinants of Health   Financial Resource Strain: Not on file  Food Insecurity: Not on file  Transportation Needs: Not on file  Physical Activity: Not on file  Stress: Not on file  Social Connections: Not on file  Intimate Partner Violence: Not on file     PHYSICAL EXAM: Vitals:   03/31/21 1501  BP: 124/89  Pulse: 86  SpO2: 97%   General: No acute distress, flat affect Head:  s/p right cranioplasty Skin/Extremities: No rash, no edema Neurological Exam: alert and awake. Mild dysarthria. Fund of knowledge is appropriate.  Attention and concentration are normal.   Cranial nerves: Pupils equal, round. Extraocular movements intact with no nystagmus. Visual fields full.  Left facial droop. Motor: tone increased on left. 5/5 on right UE and LE, 0/5 on left UE and LE. Finger to nose testing intact on right. Gait not tested.    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 Discussion in the hospital with family was that further embolic workup would not be pursued after goals of care discussion. He is on a daily aspirin, statin. He remains seizure-Franklin since June 2020 on Lacosamide 200mg  BID and Depakote 1000mg  BID. Low dose Topiramate 50mg  qhs has helped with headaches. Sleep is better with improved sleep hygiene. He is reporting dizziness during turns in bed, recommend vestibular therapy. Continue with OT for left arm spasticity, he would like to hold off on seeing PMR for now. Follow-up in 6 months, call for any changes.    Thank you for allowing me to  participate in his care.  Please do not hesitate to call for any questions or concerns.    Ellouise Newer, M.D.   CC: Dr. Nyra Capes

## 2021-03-31 NOTE — Patient Instructions (Addendum)
Good to see you. Continue all your medications. Discuss Vestibular Therapy with the physical therapy department. Follow-up in 6 months, call for any changes ? ? ?Seizure Precautions: ?1. If medication has been prescribed for you to prevent seizures, take it exactly as directed.  Do not stop taking the medicine without talking to your doctor first, even if you have not had a seizure in a long time.  ? ?2. Avoid activities in which a seizure would cause danger to yourself or to others.  Don't operate dangerous machinery, swim alone, or climb in high or dangerous places, such as on ladders, roofs, or girders.  Do not drive unless your doctor says you may. ? ?3. If you have any warning that you may have a seizure, lay down in a safe place where you can't hurt yourself.   ? ?4.  No driving for 6 months from last seizure, as per [facility].   Please refer to the following link on the [employer]ile[employer] of [employer]'s website for more information: http://www.epilepsyfoundation.org/answerplace/Social/driving/drivingu.cfm  ? ?5.  Maintain good sleep hygiene. ? ?6.  Contact your doctor if you have any problems that may be related to the medicine you are taking. ? ?7.  Call 911 and bring the patient back to the ED if: ?      ? A.  The seizure lasts longer than 5 minutes.      ? B.  The patient doesn't awaken shortly after the seizure ? C.  The patient has new problems such as difficulty seeing, speaking or moving ? D.  The patient was injured during the seizure ? E.  The patient has a temperature over 102 F (39C) ? F.  The patient vomited and now is having trouble breathing ?      ? ?

## 2021-04-02 ENCOUNTER — Ambulatory Visit: Payer: Medicare Other | Admitting: Neurology

## 2021-04-15 DIAGNOSIS — Z23 Encounter for immunization: Secondary | ICD-10-CM

## 2021-06-17 ENCOUNTER — Emergency Department: Payer: Medicare Other

## 2021-06-17 ENCOUNTER — Emergency Department
Admission: EM | Admit: 2021-06-17 | Discharge: 2021-06-17 | Disposition: A | Discharge status: Home / Self Care | Payer: Medicare Other | Attending: Emergency Medicine | Admitting: Emergency Medicine

## 2021-06-17 DIAGNOSIS — D696 Thrombocytopenia, unspecified: Secondary | ICD-10-CM | POA: Diagnosis not present

## 2021-06-17 DIAGNOSIS — G4489 Other headache syndrome: Secondary | ICD-10-CM | POA: Diagnosis not present

## 2021-06-17 DIAGNOSIS — R030 Elevated blood-pressure reading, without diagnosis of hypertension: Secondary | ICD-10-CM | POA: Insufficient documentation

## 2021-06-17 DIAGNOSIS — R519 Headache, unspecified: Secondary | ICD-10-CM | POA: Diagnosis present

## 2021-06-17 LAB — CBC WITH DIFFERENTIAL/PLATELET
Abs Immature Granulocytes: 0.05 10*3/uL (ref 0.00–0.07)
Basophils Absolute: 0 10*3/uL (ref 0.0–0.1)
Basophils Relative: 0 %
Eosinophils Absolute: 0.1 10*3/uL (ref 0.0–0.5)
Eosinophils Relative: 2 %
HCT: 47.4 % (ref 39.0–52.0)
Hemoglobin: 15.2 g/dL (ref 13.0–17.0)
Immature Granulocytes: 1 %
Lymphocytes Relative: 31 %
Lymphs Abs: 1.8 10*3/uL (ref 0.7–4.0)
MCH: 29.5 pg (ref 26.0–34.0)
MCHC: 32.1 g/dL (ref 30.0–36.0)
MCV: 92 fL (ref 80.0–100.0)
Monocytes Absolute: 0.7 10*3/uL (ref 0.1–1.0)
Monocytes Relative: 12 %
Neutro Abs: 3 10*3/uL (ref 1.7–7.7)
Neutrophils Relative %: 54 %
Platelets: 100 10*3/uL — ABNORMAL LOW (ref 150–400)
RBC: 5.15 MIL/uL (ref 4.22–5.81)
RDW: 13.2 % (ref 11.5–15.5)
WBC: 5.7 10*3/uL (ref 4.0–10.5)
nRBC: 0 % (ref 0.0–0.2)

## 2021-06-17 LAB — COMPREHENSIVE METABOLIC PANEL
ALT: 19 U/L (ref 0–44)
AST: 22 U/L (ref 15–41)
Albumin: 3.4 g/dL — ABNORMAL LOW (ref 3.5–5.0)
Alkaline Phosphatase: 95 U/L (ref 38–126)
Anion gap: 6 (ref 5–15)
BUN: 11 mg/dL (ref 6–20)
CO2: 24 mmol/L (ref 22–32)
Calcium: 8.4 mg/dL — ABNORMAL LOW (ref 8.9–10.3)
Chloride: 111 mmol/L (ref 98–111)
Creatinine, Ser: 0.84 mg/dL (ref 0.61–1.24)
GFR, Estimated: >60 mL/min (ref >60)
Glucose, Bld: 136 mg/dL — ABNORMAL HIGH (ref 70–99)
Potassium: 3.8 mmol/L (ref 3.5–5.1)
Sodium: 141 mmol/L (ref 135–145)
Total Bilirubin: 0.5 mg/dL (ref 0.3–1.2)
Total Protein: 6 g/dL — ABNORMAL LOW (ref 6.5–8.1)

## 2021-06-17 LAB — LIPASE, BLOOD: Lipase: 45 U/L (ref 11–51)

## 2021-06-17 MED ORDER — METOPROLOL TARTRATE 25 MG PO TABS
25.0000 mg | ORAL_TABLET | ORAL | Status: AC
  Administered 2021-06-17: 25 mg via ORAL
  Filled 2021-06-17: qty 1

## 2021-06-17 MED ORDER — ACETAMINOPHEN 500 MG PO TABS
1000.0000 mg | ORAL_TABLET | ORAL | Status: AC
Start: 2021-06-17 — End: 2021-06-17
  Administered 2021-06-17: 1000 mg via ORAL
  Filled 2021-06-17: qty 2

## 2021-06-17 MED ORDER — FENTANYL CITRATE PF 50 MCG/ML IJ SOSY
25.0000 ug | PREFILLED_SYRINGE | Freq: Once | INTRAMUSCULAR | Status: DC
  Filled 2021-06-17: qty 1

## 2021-06-17 MED ORDER — LACOSAMIDE 50 MG PO TABS
200.0000 mg | ORAL_TABLET | ORAL | Status: AC
  Administered 2021-06-17: 200 mg via ORAL
  Filled 2021-06-17: qty 4

## 2021-06-17 MED ORDER — TOPIRAMATE 25 MG PO TABS
50.0000 mg | ORAL_TABLET | ORAL | Status: AC
  Administered 2021-06-17: 50 mg via ORAL
  Filled 2021-06-17: qty 2

## 2021-06-17 MED ORDER — DIVALPROEX SODIUM 500 MG PO DR TAB
1000.0000 mg | DELAYED_RELEASE_TABLET | ORAL | Status: AC
  Administered 2021-06-17: 1000 mg via ORAL
  Filled 2021-06-17: qty 2

## 2021-06-17 NOTE — ED Notes (Signed)
Called [facility] for transport to [facility]  [phone]

## 2021-06-17 NOTE — Discharge Instructions (Signed)
You have been seen in the Emergency Department (ED) for a headache.  Please use Tylenol  as needed for symptoms, but only as written on the box.   As we have discussed, please follow up with your neurologist as soon as possible regarding today's Emergency Department (ED) visit and your headache symptoms.    Call your doctor or return to the ED if you have a worsening headache, sudden and severe headache, confusion, slurred speech, facial droop, weakness or numbness in any arm or leg, extreme fatigue, vision problems, or other symptoms that concern you.

## 2021-06-17 NOTE — ED Notes (Signed)
Pt gone to CT 

## 2021-06-17 NOTE — ED Notes (Signed)
Pt cleaned up, linens changed. No S/S of distress noted.

## 2021-06-17 NOTE — ED Provider Notes (Signed)
[facility] Provider Note    Event Date/Time   First MD Initiated Contact with Patient 06/17/21 [phone]     (approximate)   History   Headache   HPI  Patrick Franklin is a 51 y.o. 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   Patient reports that he has daily headaches.  Had a headache starting this morning and this feels similar but worse than his typical headache.  He has pain over the middle of the front of his scalp and then it radiates down and causes a pressure type feeling around both eyes.  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  He denies new weakness.  No fevers no neck pain.  Denies recent illness.  Reports he came because he has a headache that is typically like this but today seems worse and he has not had his headache medicine  Reports a slight blurring of his of vision, but also reports that is not unusual with his headaches.  Headache today is similar to his daily headaches but seems slightly worse.  Sees neurologist    ----------------------------------------- 7:51 AM on 06/17/2021 ----------------------------------------- Patient reports his sister is instrumental in his care, but he makes his own decisions.  He asked for me to call her.  I called and left a message to update Ms. Vance Gather, left a voicemail.  HIPAA compliant  Physical Exam   Triage Vital Signs: ED Triage Vitals  Enc Vitals Group     BP 06/17/21 0628 (!) 152/106     Pulse Rate 06/17/21 0628 82     Resp 06/17/21 0628 16     Temp 06/17/21 0628 98.3 F (36.8 C)     Temp Source 06/17/21 0628 Oral     SpO2 06/17/21 0628 96  %     Weight 06/17/21 0629 208 lb (94.3 kg)     Height 06/17/21 0629 6' (1.829 m)     Head Circumference --      Peak Flow --      Pain Score --      Pain Loc --      Pain Edu? --      Excl. in [place]? --     Most recent vital signs: Vitals:   06/17/21 0749 06/17/21 0830  BP: 134/89 (!) 149/96  Pulse: 81 80  Resp:  14  Temp:    SpO2:  97%     General: Awake, no distress.  Pleasant.  Laying in bed, no distress watching television.  Good use of right hand.  Fully alert and well oriented. CV:  Good peripheral perfusion.  Normal heart tones Resp:  Normal effort.  Clear bilaterally with normal work of breathing Abd:  No distention.  Other:  5 out of 5 strength in right arm and right leg.  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  No photophobia.  Pupils equal round reactive to light.  Extraocular movements normal.  No photophobia.  No meningismus   ED Results / Procedures / Treatments   Labs (all labs ordered are listed, but only abnormal results are displayed) Labs Reviewed  CBC WITH DIFFERENTIAL/PLATELET - Abnormal; Notable for the following components:      Result Value   Platelets 100 (*)    All other components within normal limits  COMPREHENSIVE METABOLIC PANEL - Abnormal; Notable for the following components:   Glucose, Bld 136 (*)    Calcium 8.4 (*)    Total Protein 6.0 (*)    Albumin 3.4 (*)    All other components within normal limits  LIPASE, BLOOD  URINALYSIS, ROUTINE W REFLEX MICROSCOPIC   RADIOLOGY  I personally interpreted the patient's CT scan of the head for gross pathology, I do not see evidence of acute intracranial hemorrhage.  Multiple chronic findings  DG Skull 1-3 Views  Result Date: 06/17/2021 CLINICAL DATA:  Evaluate shunt tubing.  Headache EXAM: SKULL - 1-3 VIEW COMPARISON:  05/29/2019 FINDINGS: Left frontal VP shunt, programmable. No shunt discontinuity seen over the head or neck. Chronic sunken right-sided calvarial bone flap. No acute  osseous finding. IMPRESSION: Intact shunt tubing over the head neck. Electronically Signed   By: Jorje Guild M.D.   On: 06/17/2021 07:37   DG Chest 1 View  Result Date: 06/17/2021 CLINICAL DATA:  Headache. EXAM: CHEST  1 VIEW COMPARISON:  10/19/2019 FINDINGS: Shunt tubing over the left chest is intact where covered. Low volume chest with mild atelectatic type opacity on the left. Artifact from EKG leads. Prominent heart size accentuated by low volumes. IMPRESSION: 1. Intact shunt tubing over the left chest. 2. Low volume chest with atelectasis. Electronically Signed   By: Jorje Guild M.D.   On: 06/17/2021 07:38   DG Cervical Spine 1 View  Result Date: 06/17/2021 CLINICAL DATA:  Headache.  Possible shunt malfunction EXAM: DG CERVICAL SPINE - 1 VIEW COMPARISON:  05/29/2019 FINDINGS: Intact shunt tubing over the left neck. No incidental finding in the frontal projection. IMPRESSION: Intact shunt tubing where covered over the left neck. Electronically Signed   By: Jorje Guild M.D.   On: 06/17/2021 07:36   DG Abd 1 View  Result Date: 06/17/2021 CLINICAL DATA:  Evaluate for shunt malfunction EXAM: ABDOMEN - 1 VIEW COMPARISON:  05/29/2019 FINDINGS: Shunt tubing traverses the left flank and loops in the pelvis. No abnormal mass effect detected around the catheter. Two left renal calculi measuring up to 7 mm. IVC filter in place. Pelvic calcifications, usually phleboliths. IMPRESSION: 1. Unremarkable shunt tubing over the abdomen. 2. Left nephrolithiasis. Electronically Signed   By: Jorje Guild M.D.   On: 06/17/2021 07:39   Noted left nephrolithiasis on x-ray.  Patient asymptomatic with regard to abdominal symptoms  CT Head Wo Contrast  Result Date: 06/17/2021 CLINICAL DATA:  Headache, new or worsening. EXAM: CT HEAD WITHOUT CONTRAST TECHNIQUE: Contiguous axial images were obtained from the base of the skull through the vertex without intravenous contrast. RADIATION DOSE REDUCTION: This exam  was performed according to the departmental dose-optimization program which includes automated exposure control, adjustment of the mA and/or kV according to patient size and/or use of iterative reconstruction technique. COMPARISON:  10/19/2019 FINDINGS: Brain: Remote infarct in the right MCA distribution with dense encephalomalacia. Prior right-sided craniotomy with sunken bone flap, unchanged. Left frontal VP shunt with tip at the left lateral ventricle. Stable ventricular volume. Stable low-density along the catheter course attributed to gliosis. Remote left parietal and temporal cortex infarct, also seen on prior. Vascular: No hyperdense vessel or unexpected calcification. Skull: Normal. Negative for fracture or focal lesion. Sinuses/Orbits: No acute finding. IMPRESSION: 1. No acute finding. 2. Remote cerebral  infarcts including the entire right MCA territory. 3. VP shunt with stable ventricular volume. Electronically Signed   By: Jorje Guild M.D.   On: 06/17/2021 07:31        PROCEDURES:  Critical Care performed: No  Procedures   MEDICATIONS ORDERED IN ED: Medications  divalproex (DEPAKOTE) DR tablet 1,000 mg (1,000 mg Oral Given 06/17/21 0749)  lacosamide (VIMPAT) tablet 200 mg (200 mg Oral Given 06/17/21 0749)  metoprolol tartrate (LOPRESSOR) tablet 25 mg (25 mg Oral Given 06/17/21 0749)  topiramate (TOPAMAX) tablet 50 mg (50 mg Oral Given 06/17/21 0749)  acetaminophen (TYLENOL) tablet 1,000 mg (1,000 mg Oral Given 06/17/21 0749)     IMPRESSION / MDM / ASSESSMENT AND PLAN / ED COURSE  I reviewed the triage vital signs and the nursing notes.                              Differential diagnosis includes, but is not limited to, intracranial hemorrhage, VP shunt malfunction, migraine, recurrent headache secondary to previous strokes, chronic headache syndrome.  Do not see evidence that would suggest infection, meningitis encephalitis, no evidence noted with regard to new neurologic  deficit.  The patient does not have a change in his typical headache as far as where his symptoms are occurring but seem worse today than typical.  Do not see clear evidence of suggest new deficit, or symptoms that would be concerning for acute ophthalmologic pathology such as glaucoma, optic neuritis, etc.  In addition no findings to support acute thrombosis.  Suspect exacerbation of chronic underlying headache syndrome  Blood pressure somewhat elevated, but also has not had his metoprolol this morning which we will provide him.  Patient's presentation is most consistent with exacerbation of chronic illness.  The patient is on the cardiac monitor to evaluate for evidence of arrhythmia and/or significant heart rate changes.  Labs interpreted notable for fairly unremarkable chemistry panel, slightly reduced calcium and albumin  CBC reviewed, normal white count, very minimal thrombocytopenia  Clinical Course as of 06/17/21 0902  Thu Jun 17, 2021  0857 Sister reports patient is DNR with a goal to limit hospitalizations. The symptoms he described are consistent with his prior headaches. No report of change speech, new weakness, etc.  [MQ]  [phone] Sister suggests that [employer] wheelchair Lucianne Lei is generally how he transports.  [MQ]    Clinical Course User Index [MQ] Delman Kitten, MD   ----------------------------------------- 7:54 AM on 06/17/2021 ----------------------------------------- Nurse updated me, patient reporting his headache and symptoms have gone away completely at this time.  Fentanyl discontinued  Patient now asymptomatic   Discussed with patient and his sister, both comfortable with plan to return to his healthcare facility. Return precautions and treatment recommendations and follow-up discussed with the patient who is agreeable with the plan.   FINAL CLINICAL IMPRESSION(S) / ED DIAGNOSES   Final diagnoses:  Chronic mixed headache syndrome     Rx / DC Orders   ED  Discharge Orders     None        Note:  This document was prepared using Dragon voice recognition software and may include unintentional dictation errors.   Delman Kitten, MD 06/17/21 [phone]

## 2021-06-17 NOTE — ED Provider Triage Note (Signed)
Emergency Medicine Provider Triage Evaluation Note  Patrick Franklin , a 51 y.o. male  was evaluated in triage.  Pt complains of headache and blurry vision in his right eye.  Review of Systems  Positive: Headache, right eye blurry vision Negative: Vomiting, chest pain, shortness of breath, abdominal pain  Physical Exam  BP (!) 152/106 (BP Location: Right Arm)   Pulse 82   Temp 98.3 F (36.8 C) (Oral)   Resp 16   Ht 1.829 m (6')   Wt 94.3 kg   SpO2 96%   BMI 28.21 kg/m  Gen:   Awake, no distress.  Answering questions without difficulty. Resp:  Normal effort  MSK:   Moves extremities without difficulty   Medical Decision Making  Medically screening exam initiated at 6:41 AM.  Appropriate orders placed.  Rinaldo Ratel was informed that the remainder of the evaluation will be completed by another provider, this initial triage assessment does not replace that evaluation, and the importance of remaining in the ED until their evaluation is complete.  Based on his history of prior craniotomy and VP shunt and the new symptoms he is reporting, I ordered a head CT as well as shunt series.  I also ordered urinalysis, comprehensive metabolic panel, CBC with differential, and lipase.  Patient has no focal neurological deficits concerning for CVA and code stroke does not need to be activated at this time.   Loleta Rose, MD 06/17/21 [phone]

## 2021-06-17 NOTE — ED Triage Notes (Addendum)
Pt coming from [facility]. States he woke up this morning with a headache. And blurry vision to his right eye. No other symptoms. BS:122, Hx stroke with left side deficits.

## 2021-07-17 IMAGING — CT CT ANGIO NECK
2 of 7 series · 8 of 33 positions shown · IV contrast (omnipaque)
Comparison: Prior CT from 06/23/2017.

CLINICAL DATA: Code stroke.  Old.

EXAM:
CT ANGIOGRAPHY HEAD AND NECK
TECHNIQUE: Multidetector CT imaging of the head and neck was performed using
the standard protocol during bolus administration of intravenous
contrast. Multiplanar CT image reconstructions and MIPs were
obtained to evaluate the vascular anatomy. Carotid stenosis
measurements (when applicable) are obtained utilizing NASCET
criteria, using the distal internal carotid diameter as the
denominator.
CONTRAST:  75mL OMNIPAQUE IOHEXOL 350 MG/ML SOLN

[Series 5: cta neck · axial · 0.47mm/px · z∈[-215,-103]mm · 2 of 168 slices shown]
[im 56/168  soft-tissue]
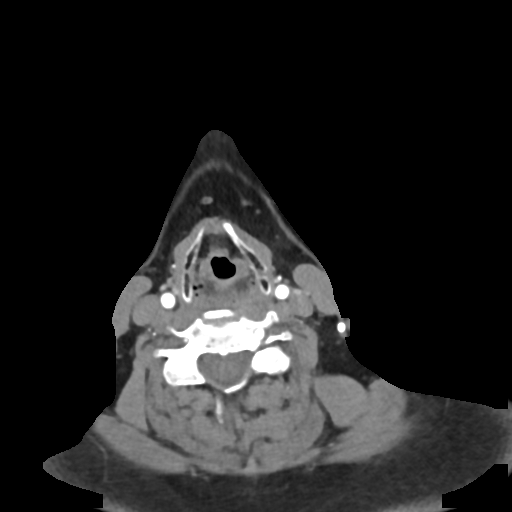
[im 112/168  soft-tissue]
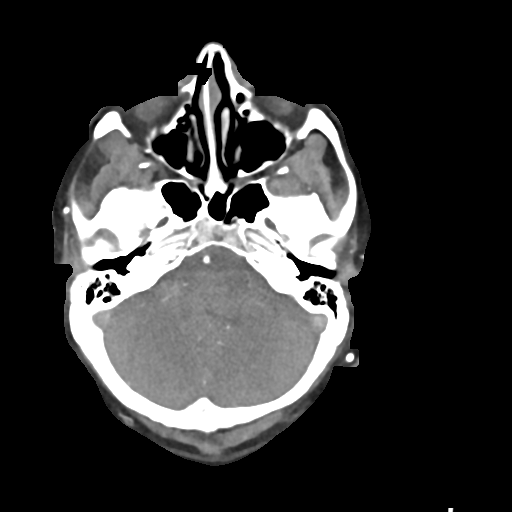

[Series 7: cta neck axial · axial · 0.39mm/px · z∈[-277,-38]mm · 6 of 334 slices shown]
[im 48/334  soft-tissue]
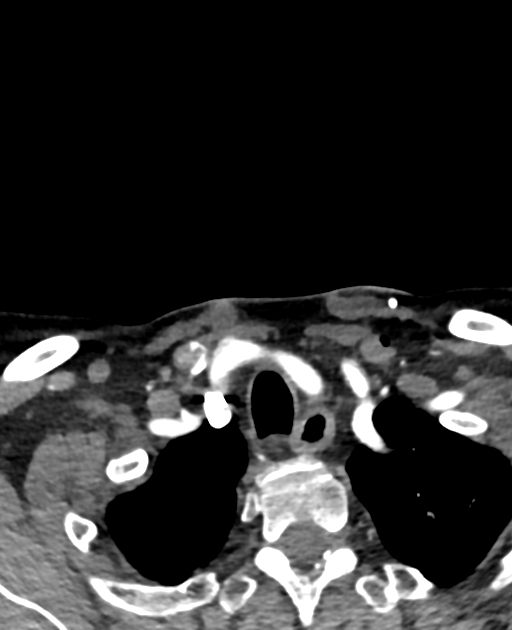
[im 96/334  bone]
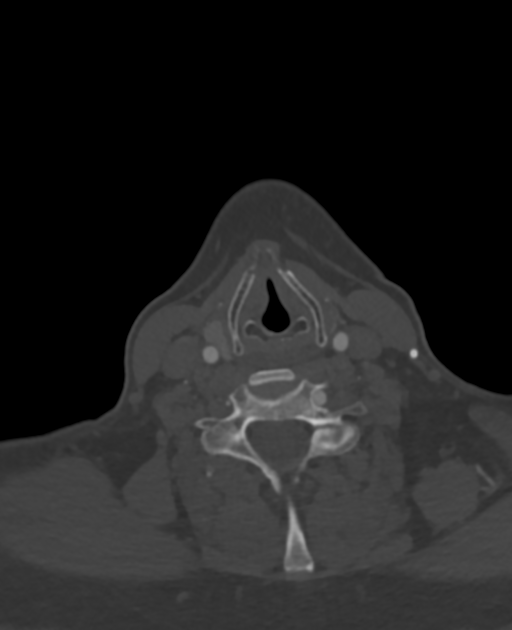
[im 143/334  soft-tissue]
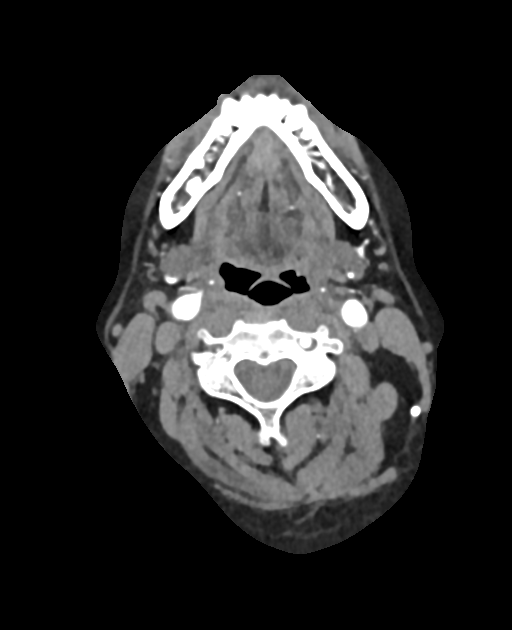
[im 191/334  bone]
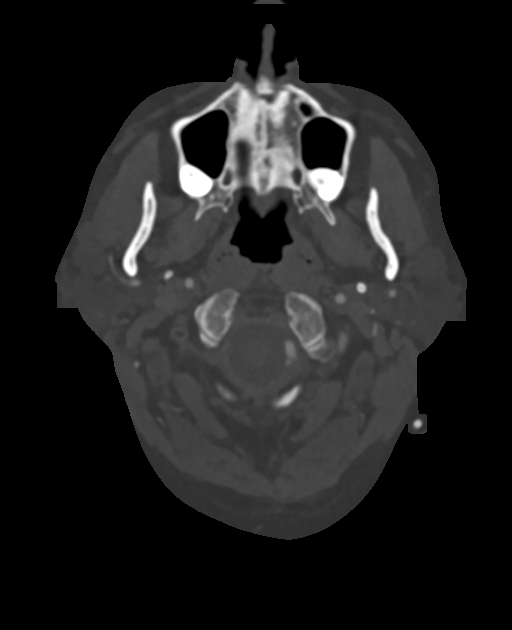
[im 238/334  soft-tissue]
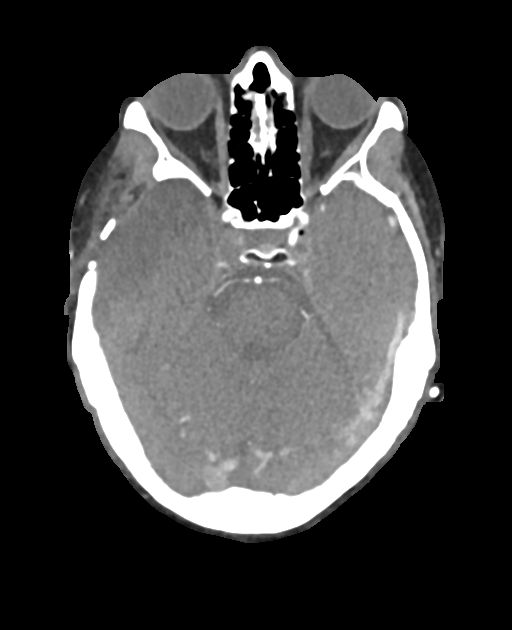
[im 286/334  bone]
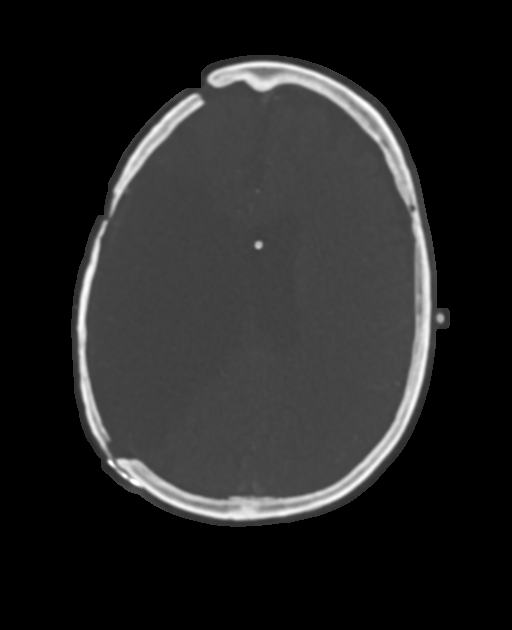

[8 of 33 positions shown; findings below may reference images not displayed]

FINDINGS: CT HEAD FINDINGS

Brain: Extensive encephalomalacia related to chronic right MCA
territory infarcts seen throughout the right cerebral hemisphere,
stable from previous. Postoperative changes from overlying right
craniotomy. A left frontal approach VP shunt catheter in place with
tip terminating near the septum pellucidum. Overall, ventricular
size and morphology is relatively similar to previous without
hydrocephalus. Ex vacuo dilatation of the right lateral ventricle
related to the chronic right frontal encephalomalacia. Dystrophic
calcification at the frontal horn of the right lateral ventricle
again noted.

No acute intracranial hemorrhage. No definite or convincing acute
large vessel territory infarct. No mass lesion or extra-axial fluid
collection. Chronic 12 mm left-to-right shift of the septum
pellucidum related to the right cerebral encephalomalacia noted,
stable.

Vascular: No hyperdense vessel. Mild calcified atherosclerosis at
the skull base.

Skull: Prior right hemi craniotomy. Left frontal burr hole with
shunt catheter in place. Remote burr holes seen at the bilateral
parietal calvarium related to previous shunt catheters. Scalp soft
tissues demonstrate no acute finding.

Sinuses: Paranasal sinuses are clear.  No mastoid effusion.

Orbits: Left gaze noted.

Aspects equals 10.

Review of the MIP images confirms the above findings

CTA NECK FINDINGS

Aortic arch: Visualized aortic arch of normal caliber with normal
branch pattern. Minimal plaque about the origin of the great vessels
without hemodynamically significant stenosis. Visualized subclavian
arteries widely patent.

Right carotid system: Right common and internal carotid arteries
widely patent without stenosis, dissection, or occlusion. No
significant atheromatous narrowing about the right carotid
bifurcation. Right ICA tortuous at the level of the skull base.

Left carotid system: Left common carotid artery widely patent from
its origin to the bifurcation without stenosis. No significant
atheromatous narrowing about the left bifurcation. Focal tortuosity
of the mid left ICA with associated market vessel irregularity and
severe high-grade stenosis is seen, concerning for arterial
dissection (series 8, images 128-117). A radiologic string sign is
present. No frank intraluminal thrombus or raised dissection flap
identified. Finding is age indeterminate. Distally, left ICA
otherwise patent to the skull base without additional stenosis or
other vascular abnormality.

Vertebral arteries: Left vertebral artery dominant and arises from
the left subclavian artery. Right vertebral artery arises directly
from the aortic arch, and is seen coursing posteriorly to the
esophagus along a similar course of an aberrant right subclavian
artery (series 7, image 317). Right vertebral is diffusely
hypoplastic, and is patent within the neck, but nearly occludes at
the level of the skull base. Dominant left vertebral artery widely
patent without stenosis, dissection, or occlusion.

Skeleton: No acute osseous finding. No discrete lytic or blastic
osseous lesions.

Other neck: No other acute soft tissue abnormality within the neck.

Upper chest: Visualized upper chest demonstrates no acute finding.
Partially visualized lungs are grossly clear.

Review of the MIP images confirms the above findings

CTA HEAD FINDINGS

Anterior circulation: Petrous, cavernous, and supraclinoid segments
widely patent without hemodynamically significant stenosis. ICA
termini well perfused. A1 segments widely patent. Normal anterior
communicating artery complex. Anterior cerebral arteries patent to
their distal aspects without stenosis.

Left M1 widely patent. Normal left MCA bifurcation. Left MCA
branches well perfused to their distal aspects.

Right MCA hypoplastic and attenuated, consistent with right MCA
territory infarct. Negative right MCA bifurcation. No right-sided
large vessel occlusion.

Posterior circulation: Focal non stenotic plaque noted within the
left V4 segment beyond the takeoff of the left PICA. Left V4 segment
otherwise widely patent to the vertebrobasilar junction. Left PICA
patent. Right vertebral markedly hypoplastic as it courses into the
cranial vault, contributing little to the posterior circulation.
Right PICA patent. Basilar patent to its distal aspect without
stenosis. Superior cerebral arteries patent bilaterally. Both of the
posterior cerebral arteries primarily supplied via the basilar and
are well perfused to their distal aspects.

Venous sinuses: Grossly patent allowing for timing the contrast
bolus.

Anatomic variants: Dominant left vertebral artery with diffusely
hypoplastic right vertebral artery. No intracranial aneurysm.

Review of the MIP images confirms the above findings
IMPRESSION: CT HEAD IMPRESSION:

1. No acute intracranial abnormality.
2. Aspects equals 10.
3. Chronic encephalomalacia throughout the right MCA territory,
consistent with chronic right MCA territory infarct.
4. Left frontal approach VP shunt catheter in place with tip
terminating in the left lateral ventricle. Stable ventricular size
and morphology without hydrocephalus.

CTA HEAD AND NECK IMPRESSION:

1. Negative CTA for emergent large vessel occlusion.
2. Short-segment vessel irregularity and high-grade near occlusive
stenosis involving the mid left ICA, concerning for arterial
dissection, age indeterminate. No frank intraluminal thrombus or
raised dissection flap identified.
3. No other hemodynamically significant or correctable stenosis
identified about the major arterial vasculature of the head and
neck.
4. Diffusely hypoplastic and attenuated right MCA, consistent with
chronic right MCA territory infarct.

Critical Value/emergent results were called by telephone at the time
of interpretation on 10/01/2018 at [DATE] to Dr. ENTAILE MOKAA , who
verbally acknowledged these results.

## 2021-07-17 IMAGING — DX DG CHEST 1V PORT
1 series · 1 of 1 positions shown · non-contrast
Comparison: None.

CLINICAL DATA: Altered mental status

EXAM:
PORTABLE CHEST 1 VIEW

[chest ap]
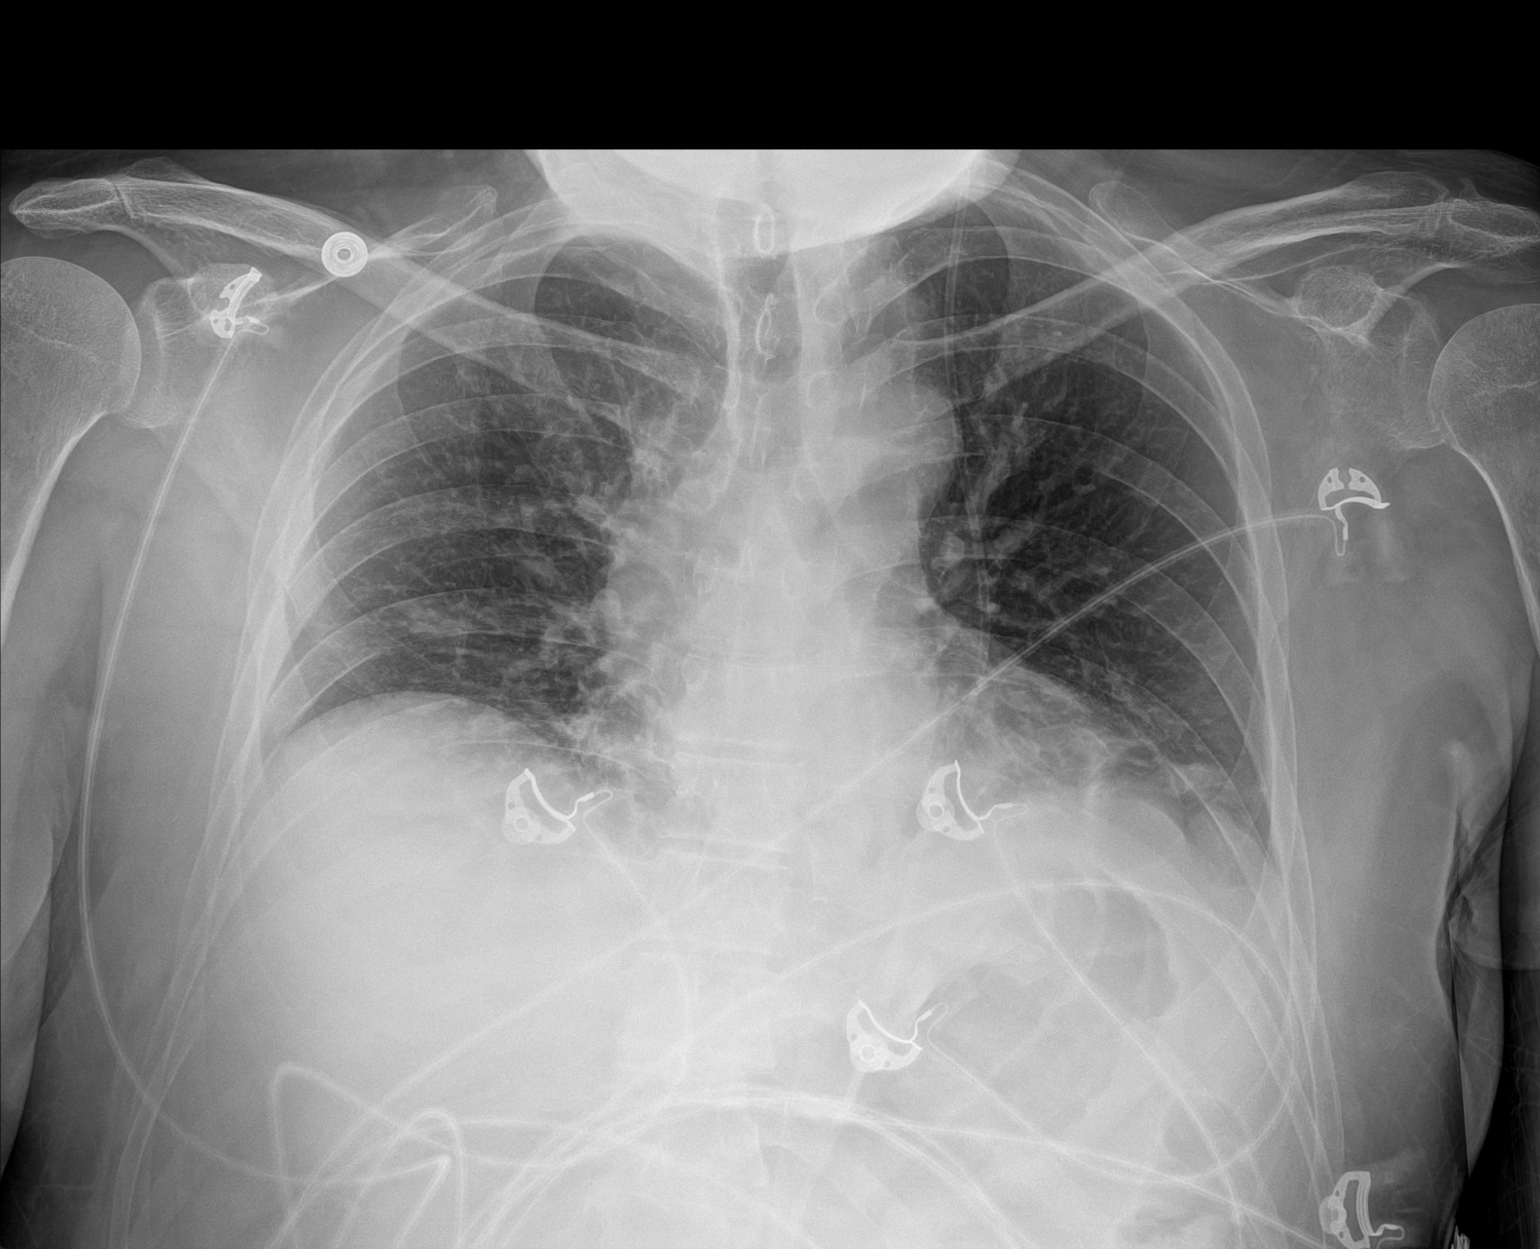

[1 of 1 positions shown; findings below may reference images not displayed]

FINDINGS: Low lung volumes. Left base scarring or atelectasis. Right lung
clear. Heart is normal size. No effusions or acute bony abnormality.
IMPRESSION: Left base scarring or atelectasis.  Low lung volumes.

## 2021-08-18 ENCOUNTER — Encounter: Payer: Self-pay | Admitting: Urology

## 2021-08-18 ENCOUNTER — Ambulatory Visit (INDEPENDENT_AMBULATORY_CARE_PROVIDER_SITE_OTHER): Payer: Medicare Other | Admitting: Urology

## 2021-08-18 VITALS — BP 121/86 | HR 73

## 2021-08-18 DIAGNOSIS — Z87898 Personal history of other specified conditions: Secondary | ICD-10-CM

## 2021-08-18 DIAGNOSIS — N3281 Overactive bladder: Secondary | ICD-10-CM | POA: Diagnosis not present

## 2021-08-18 DIAGNOSIS — N138 Other obstructive and reflux uropathy: Secondary | ICD-10-CM

## 2021-08-18 DIAGNOSIS — N401 Enlarged prostate with lower urinary tract symptoms: Secondary | ICD-10-CM | POA: Diagnosis not present

## 2021-08-18 DIAGNOSIS — N200 Calculus of kidney: Secondary | ICD-10-CM | POA: Diagnosis not present

## 2021-08-18 LAB — BLADDER SCAN AMB NON-IMAGING

## 2021-08-18 NOTE — Progress Notes (Signed)
08/18/21 12:52 PM   Rinaldo Ratel 1970/12/24 [id number]  CC: Nephrolithiasis, urinary symptoms/incontinence  HPI: Extremely comorbid 51 year old male here today with his sister who provides much of the history.  I actually saw him in August 2019 for nonobstructing left-sided stones.  He has a number of comorbidities including history of stroke, DVT, IVC filter, VP shunt, and seizure disorder.  His bladder is managed with spontaneous voiding.  Since his stroke in 2020 he has been incontinent of urine with urgency and frequency.  He reportedly had an in and out catheterization x2 in the last few weeks at his facility for difficulty urinating, but unclear what bladder scan was at that time or if he was truly in retention.  At some point a Foley catheter was placed for a few days which she did not tolerate well.  After this it sounds like he got a urinary tract infection which was treated with antibiotics.  He drinks water and ginger ale during the day.  He denies any changes from his baseline urination at this time with urgency, frequency, and urge incontinence into a depends at this time.  Bladder scan today is normal at 160 mL, he denies the urge to void.  He denies any dysuria or gross hematuria.   He had a KUB at an ER visit in May that showed a left-sided renal stone as well.  He denies any flank pain at this time.  PMH: Past Medical History:  Diagnosis Date   Aorto-iliac atherosclerosis 08/29/2017   By CT   Asthma    Cardiomegaly 04/13/2016   Cerebral embolism with cerebral infarction 10/03/2018   DVT (deep venous thrombosis) (HCC)    Dysphagia following other cerebrovascular disease    Elevated serum creatinine    Essential hypertension 05/15/2017   GERD without esophagitis    Headache    Hemiparesis affecting left side as late effect of stroke 08/09/2017   History of ischemic right MCA stroke 2017   History of left MCA stroke 2020   History of renal stone 08/22/2017   Stone  analysis - calcium stone (08/2017) 8 non obstructing L kidney by CT - will refer to urology (08/2017)   Homonymous bilateral field defects    Hyperlipidemia 05/15/2017   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   MDD (major depressive disorder) 05/15/2017   Neurogenic bladder    Presence of cerebrospinal fluid drainage device    Pulmonary embolism 01/13/2016   Seizure disorder 08/09/2017   Spastic hemiplegia affecting nondominant side 03/26/2018   Subluxation of shoulder joint 04/13/2016   left   Thrombocytopenia 10/01/2018   UTI (urinary tract infection)     Surgical History: Past Surgical History:  Procedure Laterality Date   CRANIECTOMY  2017   CRANIOPLASTY  2018   IVC FILTER INSERTION  2017   PEG TUBE PLACEMENT  2017   VENTRICULOPERITONEAL SHUNT  2018    Family History: Family History  Problem Relation Age of Onset   Stroke Maternal Uncle    Stroke Maternal Uncle     Social History:  reports that he has never smoked. He has quit using smokeless tobacco.  His smokeless tobacco use included chew. He reports that he does not currently use alcohol. He reports that he does not use drugs.  Physical Exam: BP 121/86 (BP Location: Right Arm, Patient Position: Sitting, Cuff Size: Large)   Pulse 73    Constitutional: Flat affect, conversational Cardiovascular: No clubbing, cyanosis, or edema. Respiratory: Normal respiratory effort, no increased  work of breathing. GI: Abdomen is soft, nontender, nondistended, no abdominal masses GU: Circumcised phallus with patent meatus, mild erythema around the tip of the penis but no lesions   Laboratory Data: Reviewed  Pertinent Imaging: I have personally viewed and interpreted the KUB showing a 7 mm left midpole renal stone.  Assessment & Plan:   Very comorbid 51 year old male with history of VP shunt, seizures, and strokes here for nephrolithiasis incidentally seen on KUB, and urinary urgency/frequency/urge incontinence that has  been present since his most recent stroke in 2020.  Regarding the nephrolithiasis, we reviewed options including surveillance, shockwave lithotripsy, or ureteroscopy.  If this remains asymptomatic at this time I recommended surveillance, especially in the setting of his numerous comorbidities, and we will plan for yearly KUB imaging.  Return precautions discussed regarding left-sided flank pain or gross hematuria.  In terms of his urinary symptoms, we discussed the overlap between BPH, UTIs, and OAB, especially in the setting of prior stroke and his other comorbidities, and the difficulty to tease out exact etiology of urinary symptoms.  With his history of reported retention, I am hesitant to start with an OAB medication, and recommended starting with Flomax 0.4 mg nightly and risk and benefits were discussed.  I also recommended 5 days of Keflex for any possible residual UTI with his multiple catheters over the last few weeks, and difficulty obtaining a urine sample.  -Trial of Flomax 0.4 mg nightly -Keflex 500 mg twice daily x5 days -Yearly KUB for stone surveillance -RTC 1-2 months PVR and symptom check, consider OAB medication like Myrbetriq if persistent OAB symptoms at that time  I spent 65 total minutes on the day of the encounter including pre-visit review of the medical record, face-to-face time with the patient, and post visit ordering of labs/imaging/tests.  Legrand Rams, MD 08/18/2021  [facility] [address] [place], [place] [address] [phone]

## 2021-09-08 ENCOUNTER — Ambulatory Visit: Payer: Self-pay | Admitting: Urology

## 2021-10-04 ENCOUNTER — Ambulatory Visit (INDEPENDENT_AMBULATORY_CARE_PROVIDER_SITE_OTHER): Payer: Medicare Other | Admitting: Neurology

## 2021-10-04 ENCOUNTER — Encounter: Payer: Self-pay | Admitting: Neurology

## 2021-10-04 VITALS — BP 131/91 | HR 97

## 2021-10-04 DIAGNOSIS — F0390 Unspecified dementia without behavioral disturbance: Secondary | ICD-10-CM | POA: Diagnosis not present

## 2021-10-04 DIAGNOSIS — I639 Cerebral infarction, unspecified: Secondary | ICD-10-CM | POA: Diagnosis not present

## 2021-10-04 DIAGNOSIS — R519 Headache, unspecified: Secondary | ICD-10-CM

## 2021-10-04 DIAGNOSIS — G40209 Localization-related (focal) (partial) symptomatic epilepsy and epileptic syndromes with complex partial seizures, not intractable, without status epilepticus: Secondary | ICD-10-CM

## 2021-10-04 MED ORDER — TOPIRAMATE 50 MG PO TABS
ORAL_TABLET | ORAL | 3 refills | Status: DC
Start: 2021-10-04 — End: 2022-05-19

## 2021-10-04 NOTE — Patient Instructions (Signed)
Good to see you.  Increase Topiramate 50mg : take 1 and 1/2 tablets daily. Start reducing Tylenol to 2-3 times a week otherwise headaches will be harder to treat  2. Continue all your other medications  3. Follow-up in 6 months, call for any changes  Seizure Precautions: 1. If medication has been prescribed for you to prevent seizures, take it exactly as directed.  Do not stop taking the medicine without talking to your doctor first, even if you have not had a seizure in a long time.   2. Avoid activities in which a seizure would cause danger to yourself or to others.  Don't operate dangerous machinery, swim alone, or climb in high or dangerous places, such as on ladders, roofs, or girders.  Do not drive unless your doctor says you may.  3. If you have any warning that you may have a seizure, lay down in a safe place where you can't hurt yourself.    4.  No driving for 6 months from last seizure, as per [facility].   Please refer to the following link on the Epile[employer] of [employer]'s website for more information: http://www.epilepsyfoundation.org/answerplace/Social/driving/drivingu.cfm   5.  Maintain good sleep hygiene.   6.  Contact your doctor if you have any problems that may be related to the medicine you are taking.  7.  Call 911 and bring the patient back to the ED if:        A.  The seizure lasts longer than 5 minutes.       B.  The patient doesn't awaken shortly after the seizure  C.  The patient has new problems such as difficulty seeing, speaking or moving  D.  The patient was injured during the seizure  E.  The patient has a temperature over 102 F (39C)  F.  The patient vomited and now is having trouble breathing

## 2021-10-04 NOTE — Progress Notes (Signed)
NEUROLOGY FOLLOW UP OFFICE NOTE  Patrick Franklin [id number] 11-Mar-1970  HISTORY OF PRESENT ILLNESS: I had the pleasure of seeing Patrick Franklin in follow-up in the neurology clinic on 10/04/2021.  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 He is again accompanied by his sister Patrick Franklin who helps supplement the history today.  Records and images were personally reviewed where available.  No seizures since 2020, he is on Lacosamide 200mg  BID, Depakote 1000mg  BID without side effects. Since his last visit, he was brought to the ER on 06/15/21 for headache. He states the headaches are still there but not as bad. He takes Tylenol daily. He describes a pain behind his right eye, like a pressure with dizziness. He has pain on the left side around the surgical bed and back of his head. Sometimes there is tenderness. He notes ringing in both ears. There is a sensation of air pressure on both ears, worse on left when he gets off an airplane. He is not sleeping well, he is on Trazodone. He has spastic contractures on the left elbow and wrist, causing pain, however Botox did not seem to help before. is happy to report he worked with PT and took 12 steps with his hemiwalker. He is on Topiramate 50mg  qhs for headache prophylaxis. He is also on Cymbalta 90mg  daily and Sertaline 50mg  daily.    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 He and his sister report that he had a stroke in December 2017, then has had 3 seizures since then. The first occurred summer 2018 with no prior warning, he woke up with sore muscles, tongue bite and urinary incontinence. The second seizure occurred in February 2019 in his sleep where his wife reported extremities were extended with tongue  bite. The most recent one was last 04/23/17 witnessed by his family. His sister reports they were eating then he slumped forward and arms stiffened with shaking for 2 minutes, left arm was drawn up. He was unresponsive with head turn to the left. After 2-3 minutes, he would calm down, then start having movements but able to answer their questions. He had a lot of vomiting after both seizures. He reports a lot of pressure behind his eyes when he had the seizure. He was brought to [facility] ER where CBC and CMP were unremarkable. I personally reviewed head CT without contrast which showed severe right MCA encephalomalacia, left parietal approach shunt catheter tip in left lateral ventricle, no hydrocephalus. There was a hyperdense focus in the right lateral ventricle, ex vacuo rightward midline shift. He was continued on Vimpat 100mg  BID. He denies any missed medications or sleep deprivation, but there has been a lot of stress with moving to live with his sister and brother-in-law away from his wife. They have a sitter with him during the day while his family is working.    He has pressure behind his eyes on a daily basis. Eye exam reported as normal. He has some neck pain. He has residual weakness on the left side, denies any numbness/tingling or pain. He denies any olfactory/gustatory hallucinations, deja vu, rising epigastric sensation, myoclonic jerks. Mood is okay. His sister has noticed he stares off at times, but replies when called. Family fills his pillbox for him. He is also taking Ritalin  prescribed at rehab because he was having attention difficulties and needed prompting to initiate activities, not progressing with therapy. They feel this has been very helpful for him, his brother-in-law noticed a difference in interaction when he ran out of medication, he was less interactive. He states he does not take it all the time. There is a family history of stroke. No family history of seizures.   Epilepsy Risk  Factors:  Complete right MCA territory encephalomalacia s/p right hemicraniectomy. Otherwise he had a normal birth and early development.  There is no history of febrile convulsions, CNS infections such as meningitis/encephalitis, or family history of seizures.  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     PAST MEDICAL HISTORY: Past Medical History:  Diagnosis Date   Aorto-iliac atherosclerosis 08/29/2017   By CT   Asthma    Cardiomegaly 04/13/2016   Cerebral embolism with cerebral infarction 10/03/2018   DVT (deep venous thrombosis) (HCC)    Dysphagia following other cerebrovascular disease    Elevated serum creatinine    Essential hypertension 05/15/2017   GERD without esophagitis    Headache    Hemiparesis affecting left side as late effect of stroke 08/09/2017   History of ischemic right MCA stroke 2017   History of left MCA stroke 2020   History of renal stone 08/22/2017   Stone analysis - calcium stone (08/2017) 8 non obstructing L kidney by CT - will refer to urology (08/2017)   Homonymous bilateral field defects    Hyperlipidemia 05/15/2017   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     aspirin EC 81 MG tablet Take 81 mg by mouth daily.     atorvastatin (LIPITOR) 80 MG tablet TAKE 1 TABLET BY MOUTH ONCE DAILY AT  BEDTIME 90 tablet 2   Baclofen 5 MG TABS      divalproex (DEPAKOTE) 500 MG DR tablet Take 2 tablets (1,000 mg total) by mouth every 12 (twelve) hours. 360 tablet 3   DULoxetine (CYMBALTA) 30 MG capsule Take 1 capsule every night (Take with 60mg  capsule for total of 90mg  every night) 90 capsule 3   DULoxetine (CYMBALTA) 60 MG capsule Take 1 capsule every night (take with 30mg  capsule for total of 90mg  every night) 90 capsule 3   lacosamide (VIMPAT) 200 MG TABS tablet Take 1 tablet (200 mg total) by mouth 2 (two) times daily. 180 tablet 3   lidocaine (XYLOCAINE) 1 % (with preservative) injection SMARTSIG:Rectally     loperamide (IMODIUM) 2 MG capsule Take by mouth.     Melatonin 3 MG CAPS Take 1 capsule by mouth at bedtime.     Melatonin 5 MG CAPS Take 1 capsule by mouth at bedtime.     metoprolol tartrate (LOPRESSOR) 25 MG tablet SMARTSIG:Tablet(s) By Mouth     mineral oil-hydrophilic petrolatum (AQUAPHOR) ointment Apply  as directed to affected area as directed  Apply  to left ear topically every day shift related to [place] TISSUE (Z48.817)     Multiple Vitamins-Minerals (MULTI COMPLETE/IRON) TABS      mupirocin ointment (BACTROBAN) 2 % SMARTSIG:1 Application Topical 2-3 Times Daily     Propylene Glycol (SYSTANE BALANCE) 0.6 % SOLN Apply to eye.     Sennosides (SENNA) 8.6 MG CAPS Take 2 capsules by mouth daily.     sertraline (ZOLOFT) 50 MG tablet Take 1 tablet by mouth daily.     topiramate (TOPAMAX) 50 MG tablet Take 1 tablet daily 90 tablet 3   White Petrolatum-Mineral Oil (LUBRIFRESH P.M. OP) Apply to eye.     No current facility-administered medications on file prior to visit.    ALLERGIES: Allergies  Allergen Reactions   Midazolam Other (See Comments)    Drops blood pressure and heart rate Other reaction(s): Other (See Comments) Drops blood pressure and heart rate   Midazolam Hcl     FAMILY HISTORY: Family  History  Problem Relation Age of Onset   Stroke Maternal Uncle    Stroke Maternal Uncle     SOCIAL HISTORY: Social History   Socioeconomic History   Marital status: Divorced    Spouse name: Not on file   Number of children: Not on file   Years of education: 14   Highest education level: Associate degree: academic program  Occupational History   Occupation: Disability  Tobacco Use   Smoking status: Never   Smokeless tobacco: Former    Types: Nurse, children's Use: Never used  Substance and Sexual Activity   Alcohol use: Not Currently   Drug use: Never   Sexual activity: Not on file  Other Topics Concern   Not on file  Social History Narrative   ** Merged History Encounter **       Single. 4 children.  Once worked as an Barrister's clerk.  Enjoys reading.  [facility] Right Handed   Social Determinants of Health   Financial Resource Strain: Not on file  Food Insecurity: Not on file  Transportation Needs: Not on file  Physical Activity: Not on file  Stress: Not on file  Social Connections: Not on file  Intimate Partner Violence: Not on file     PHYSICAL EXAM: Vitals:   10/04/21 1543  BP: (!) 131/91  Pulse: 97  SpO2: 98%   General: No acute distress Head:  s/p craniotomy on right side Skin/Extremities: No rash, no edema Neurological Exam: alert and awake. Mild dysarthria and aphasia. Fund of knowledge is reduced. Attention and concentration are normal.   Cranial nerves: Pupils equal, round. Extraocular movements intact with no nystagmus. Visual fields full.  Left facial droop. Motor: Increased tone on left with 0/5 left UE and LE, 5/5 on right. Finger to nose testing intact on right. Gait not tested.   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 Discussion  in the hospital with family was that further embolic workup would not be pursued after goals of care discussion. Continue daily aspirin, control of vascular risk factors. No seizures since June 2020, continue Lacosamide 200mg  BID and Depakote 1000mg  BID. He is reporting more headaches and will increase Topiramate to 75mg  qhs and start reducing Tylenol intake to 2-3 a  week. He is on a daily aspirin, statin. He remains seizure-free since June 2020 on Lacosamide 200mg  BID and Depakote 1000mg  BID. Continue all other medications. He does not drive. Follow-up in 6 months, call for any changes.    Thank you for allowing me to participate in his care.  Please do not hesitate to call for any questions or concerns.    , M.D.   CC: Dr. 

## 2021-10-19 ENCOUNTER — Ambulatory Visit: Payer: Medicare Other | Admitting: Urology

## 2021-12-10 ENCOUNTER — Encounter: Payer: Self-pay | Admitting: Emergency Medicine

## 2021-12-10 ENCOUNTER — Other Ambulatory Visit: Payer: Self-pay

## 2021-12-10 ENCOUNTER — Emergency Department
Admission: EM | Admit: 2021-12-10 | Discharge: 2021-12-10 | Disposition: A | Discharge status: Skilled Nursing Facility | Payer: Medicare Other | Attending: Emergency Medicine | Admitting: Emergency Medicine

## 2021-12-10 DIAGNOSIS — R451 Restlessness and agitation: Secondary | ICD-10-CM | POA: Insufficient documentation

## 2021-12-10 DIAGNOSIS — F4325 Adjustment disorder with mixed disturbance of emotions and conduct: Secondary | ICD-10-CM | POA: Insufficient documentation

## 2021-12-10 DIAGNOSIS — I69354 Hemiplegia and hemiparesis following cerebral infarction affecting left non-dominant side: Secondary | ICD-10-CM | POA: Insufficient documentation

## 2021-12-10 DIAGNOSIS — G40909 Epilepsy, unspecified, not intractable, without status epilepticus: Secondary | ICD-10-CM | POA: Insufficient documentation

## 2021-12-10 DIAGNOSIS — R45851 Suicidal ideations: Secondary | ICD-10-CM | POA: Insufficient documentation

## 2021-12-10 DIAGNOSIS — I10 Essential (primary) hypertension: Secondary | ICD-10-CM | POA: Diagnosis not present

## 2021-12-10 LAB — SALICYLATE LEVEL: Salicylate Lvl: <7 mg/dL — ABNORMAL LOW (ref 7.0–30.0)

## 2021-12-10 LAB — COMPREHENSIVE METABOLIC PANEL
ALT: 19 U/L (ref 0–44)
AST: 20 U/L (ref 15–41)
Albumin: 3.7 g/dL (ref 3.5–5.0)
Alkaline Phosphatase: 89 U/L (ref 38–126)
Anion gap: 7 (ref 5–15)
BUN: 14 mg/dL (ref 6–20)
CO2: 25 mmol/L (ref 22–32)
Calcium: 8.9 mg/dL (ref 8.9–10.3)
Chloride: 109 mmol/L (ref 98–111)
Creatinine, Ser: 0.82 mg/dL (ref 0.61–1.24)
GFR, Estimated: >60 mL/min (ref >60)
Glucose, Bld: 94 mg/dL (ref 70–99)
Potassium: 4.5 mmol/L (ref 3.5–5.1)
Sodium: 141 mmol/L (ref 135–145)
Total Bilirubin: 0.7 mg/dL (ref 0.3–1.2)
Total Protein: 6.6 g/dL (ref 6.5–8.1)

## 2021-12-10 LAB — CBC
HCT: 48.5 % (ref 39.0–52.0)
Hemoglobin: 15.5 g/dL (ref 13.0–17.0)
MCH: 28.5 pg (ref 26.0–34.0)
MCHC: 32 g/dL (ref 30.0–36.0)
MCV: 89.3 fL (ref 80.0–100.0)
Platelets: 95 10*3/uL — ABNORMAL LOW (ref 150–400)
RBC: 5.43 MIL/uL (ref 4.22–5.81)
RDW: 14.9 % (ref 11.5–15.5)
WBC: 5.6 10*3/uL (ref 4.0–10.5)
nRBC: 0 % (ref 0.0–0.2)

## 2021-12-10 LAB — ETHANOL: Alcohol, Ethyl (B): <10 mg/dL (ref <10)

## 2021-12-10 LAB — ACETAMINOPHEN LEVEL: Acetaminophen (Tylenol), Serum: <10 ug/mL — ABNORMAL LOW (ref 10–30)

## 2021-12-10 MED ORDER — ACETAMINOPHEN 325 MG PO TABS: 650.0000 mg | ORAL_TABLET | ORAL | Status: DC | PRN
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
## 2021-12-10 NOTE — ED Notes (Signed)
Attempted to call report to [facility] [phone]) w/ out answer at this time.
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
## 2021-12-10 NOTE — ED Notes (Signed)
Pt unable to sign discharge papers- pt gives verbal consent for discharge and states he usually does go back by ambulance.

## 2021-12-10 NOTE — ED Notes (Signed)
Called [facility] for transport to [facility] health Care  [phone]
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
## 2021-12-10 NOTE — Discharge Instructions (Signed)
Continue taking all medications as usual.

## 2021-12-10 NOTE — ED Notes (Signed)
Pt given nighttime snack. 

## 2021-12-10 NOTE — ED Notes (Signed)
Signature pad not working.  Patient verbalized understanding.

## 2022-01-31 ENCOUNTER — Encounter: Payer: Self-pay | Admitting: Internal Medicine

## 2022-01-31 ENCOUNTER — Inpatient Hospital Stay: Payer: Medicare Other | Attending: Internal Medicine | Admitting: Internal Medicine

## 2022-01-31 ENCOUNTER — Other Ambulatory Visit: Payer: Self-pay

## 2022-01-31 ENCOUNTER — Inpatient Hospital Stay: Payer: Medicare Other

## 2022-01-31 VITALS — BP 113/78 | HR 144 | Temp 96.6°F | Resp 18

## 2022-01-31 DIAGNOSIS — D61818 Other pancytopenia: Secondary | ICD-10-CM | POA: Diagnosis present

## 2022-01-31 DIAGNOSIS — I10 Essential (primary) hypertension: Secondary | ICD-10-CM | POA: Diagnosis not present

## 2022-01-31 DIAGNOSIS — Z87891 Personal history of nicotine dependence: Secondary | ICD-10-CM | POA: Insufficient documentation

## 2022-01-31 DIAGNOSIS — Z8673 Personal history of transient ischemic attack (TIA), and cerebral infarction without residual deficits: Secondary | ICD-10-CM | POA: Insufficient documentation

## 2022-01-31 DIAGNOSIS — D696 Thrombocytopenia, unspecified: Secondary | ICD-10-CM

## 2022-01-31 LAB — CBC WITH DIFFERENTIAL/PLATELET
Abs Immature Granulocytes: 0.07 10*3/uL (ref 0.00–0.07)
Basophils Absolute: 0 10*3/uL (ref 0.0–0.1)
Basophils Relative: 0 %
Eosinophils Absolute: 0.2 10*3/uL (ref 0.0–0.5)
Eosinophils Relative: 2 %
HCT: 52.6 % — ABNORMAL HIGH (ref 39.0–52.0)
Hemoglobin: 17.1 g/dL — ABNORMAL HIGH (ref 13.0–17.0)
Immature Granulocytes: 1 %
Lymphocytes Relative: 34 %
Lymphs Abs: 2.5 10*3/uL (ref 0.7–4.0)
MCH: 29.4 pg (ref 26.0–34.0)
MCHC: 32.5 g/dL (ref 30.0–36.0)
MCV: 90.4 fL (ref 80.0–100.0)
Monocytes Absolute: 0.7 10*3/uL (ref 0.1–1.0)
Monocytes Relative: 10 %
Neutro Abs: 3.8 10*3/uL (ref 1.7–7.7)
Neutrophils Relative %: 53 %
Platelets: 110 10*3/uL — ABNORMAL LOW (ref 150–400)
RBC: 5.82 MIL/uL — ABNORMAL HIGH (ref 4.22–5.81)
RDW: 14.1 % (ref 11.5–15.5)
WBC: 7.3 10*3/uL (ref 4.0–10.5)
nRBC: 0 % (ref 0.0–0.2)

## 2022-01-31 LAB — COMPREHENSIVE METABOLIC PANEL
ALT: 17 U/L (ref 0–44)
AST: 24 U/L (ref 15–41)
Albumin: 3.7 g/dL (ref 3.5–5.0)
Alkaline Phosphatase: 98 U/L (ref 38–126)
Anion gap: 9 (ref 5–15)
BUN: 18 mg/dL (ref 6–20)
CO2: 25 mmol/L (ref 22–32)
Calcium: 8.7 mg/dL — ABNORMAL LOW (ref 8.9–10.3)
Chloride: 108 mmol/L (ref 98–111)
Creatinine, Ser: 0.86 mg/dL (ref 0.61–1.24)
GFR, Estimated: >60 mL/min (ref >60)
Glucose, Bld: 112 mg/dL — ABNORMAL HIGH (ref 70–99)
Potassium: 4 mmol/L (ref 3.5–5.1)
Sodium: 142 mmol/L (ref 135–145)
Total Bilirubin: 0.5 mg/dL (ref 0.3–1.2)
Total Protein: 6.4 g/dL — ABNORMAL LOW (ref 6.5–8.1)

## 2022-01-31 LAB — HIV ANTIBODY (ROUTINE TESTING W REFLEX): HIV Screen 4th Generation wRfx: NONREACTIVE

## 2022-01-31 LAB — TECHNOLOGIST SMEAR REVIEW: Plt Morphology: DECREASED

## 2022-01-31 LAB — HEPATITIS B SURFACE ANTIGEN: Hepatitis B Surface Ag: NONREACTIVE

## 2022-01-31 LAB — HEPATITIS B CORE ANTIBODY, IGM: Hep B C IgM: NONREACTIVE

## 2022-01-31 LAB — VITAMIN B12: Vitamin B-12: 625 pg/mL (ref 180–914)

## 2022-01-31 LAB — HEPATITIS C ANTIBODY: HCV Ab: NONREACTIVE

## 2022-01-31 LAB — LACTATE DEHYDROGENASE: LDH: 130 U/L (ref 98–192)

## 2022-01-31 LAB — FOLATE: Folate: 13.4 ng/mL (ref >5.9)

## 2022-01-31 NOTE — Assessment & Plan Note (Addendum)
#  THROMBOCYTOPENIA- [90s hemoglobin- 15; WBC- wnl]   The etiology is unclear. ? Depakote. I reviewed with the patient and family the multiple causes of thrombocytopenia include-benign causes like liver disease/hypersplenism; vitamin deficiencies--including B 12 folic acid; viral infections-HIV/hepatitis; autoimmune causes; medications etc. I also reviewed malignant causes include: leukemias-MDS/lymphomas  and rare infiltration of the bone marrow with carcinomas.  However very low clinical suspicion of any acute process.Discussed the possible need for a bone marrow biopsy if above work-up is unrevealing; all labs are progressively getting worse.  Also discussed regarding imaging with ultrasound.  # Recommend blood work-CBC CMP LDH hepatitis panel;HIV; iron studies ferritin; reticulocyte count review of peripheral smear; B12 folate acid.  Ultrasound liver.  # Stroke left side weakness [2104 & 2020]/ seizures- depakote/vimpat/Topomax- asprin- STABLE.   Thank you Dr. Kellie Simmering for allowing me to participate in the care of your pleasant patient. Please do not hesitate to contact me with questions or concerns in the interim.  The above plan of care was discussed with patient and his sister in detail.   # DISPOSITION: # labs today- ordered # follow up in 3 weeks; MD; No labs; [place] prior- Dr.B

## 2022-01-31 NOTE — Progress Notes (Signed)
New patient evaluation.   HR 144 today

## 2022-01-31 NOTE — Progress Notes (Signed)
[facility] NOTE  Patient Care Team: Maryella Shivers, MD as PCP - General (Family Medicine) Cameron Sprang, MD as Consulting Physician (Neurology)  CHIEF COMPLAINTS/PURPOSE OF CONSULTATION: Pancytopenia   HEMATOLOGY HISTORY   Latest Reference Range & Units 04/23/17 17:30 10/01/18 18:50 10/02/18 03:33 10/04/18 06:39 10/15/18 09:36 03/07/19 07:37 10/19/19 06:24 06/17/21 06:42 12/10/21 15:39  Platelets 150 - 400 K/uL 173 82 (L) 70 (L) 68 (L) 113 (L) 77 (L) 104 (L) 100 (L) 95 (L)  (L): Data is abnormally low  # PANCYTOPENIA [platelets-  WBC-  ANC-; Hb- MCV- HIV/Hepatitis: Alcohol; CT: [place]:   HISTORY OF PRESENTING ILLNESS: In a wheelchair.  History of stroke.  Accompanied by son.  Patrick Franklin 52 y.o.  male pleasant patient history of stroke right-sided weakness was been referred to [place] for further evaluation of thrombocytopenia.  Patient noted to have incidental low blood counts since 2020.  Most recent platelet count around 95 or so.  Denies any gum bleeds or nosebleeds.  No prior history of any hematologic disorder/or prior bone marrow biopsies.  Alcohol: no  Hepatitis:no HIV: no Auto-immune disease: no  Herbal medications/ new medications: no Hx of malignancy:no  Review of Systems  Constitutional:  Negative for chills, diaphoresis, fever, malaise/fatigue and weight loss.  HENT:  Negative for nosebleeds and sore throat.   Eyes:  Negative for double vision.  Respiratory:  Negative for cough, hemoptysis, sputum production, shortness of breath and wheezing.   Cardiovascular:  Negative for chest pain, palpitations, orthopnea and leg swelling.  Gastrointestinal:  Negative for abdominal pain, blood in stool, constipation, diarrhea, heartburn, melena, nausea and vomiting.  Genitourinary:  Negative for dysuria, frequency and urgency.  Musculoskeletal:  Negative for back pain and joint pain.  Skin: Negative.  Negative for itching and rash.  Neurological:   Positive for focal weakness. Negative for dizziness, tingling, weakness and headaches.  Endo/Heme/Allergies:  Does not bruise/bleed easily.  Psychiatric/Behavioral:  Negative for depression. The patient is not nervous/anxious and does not have insomnia.    MEDICAL HISTORY:  Past Medical History:  Diagnosis Date   Aorto-iliac atherosclerosis 08/29/2017   By CT   Asthma    Cardiomegaly 04/13/2016   Cerebral embolism with cerebral infarction 10/03/2018   DVT (deep venous thrombosis) (HCC)    Dysphagia following other cerebrovascular disease    Elevated serum creatinine    Essential hypertension 05/15/2017   GERD without esophagitis    Headache    Hemiparesis affecting left side as late effect of stroke 08/09/2017   History of ischemic right MCA stroke 2017   History of left MCA stroke 2020   History of renal stone 08/22/2017   Stone analysis - calcium stone (08/2017) 8 non obstructing L kidney by CT - will refer to urology (08/2017)   Homonymous bilateral field defects    Hyperlipidemia 05/15/2017   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   MDD (major depressive disorder) 05/15/2017   Neurogenic bladder    Presence of cerebrospinal fluid drainage device    Pulmonary embolism 01/13/2016   Seizure disorder 08/09/2017   Spastic hemiplegia affecting nondominant side 03/26/2018   Subluxation of shoulder joint 04/13/2016   left   Thrombocytopenia 10/01/2018   UTI (urinary tract infection)     SURGICAL HISTORY: Past Surgical History:  Procedure Laterality Date   CRANIECTOMY  2017   CRANIOPLASTY  2018   IVC FILTER INSERTION  2017   PEG TUBE PLACEMENT  2017   VENTRICULOPERITONEAL SHUNT  2018  SOCIAL HISTORY: Social History   Socioeconomic History   Marital status: Divorced    Spouse name: Not on file   Number of children: Not on file   Years of education: 14   Highest education level: Associate degree: academic program  Occupational History   Occupation: Disability  Tobacco  Use   Smoking status: Former    Types: Cigarettes    Quit date: 01/21/2003    Years since quitting: 19.0   Smokeless tobacco: Former    Types: Nurse, children's Use: Every day  Substance and Sexual Activity   Alcohol use: Not Currently   Drug use: Never   Sexual activity: Not on file  Other Topics Concern   Not on file  Social History Narrative   ** Merged History Encounter **       Single. 4 children.  Once worked as an Barrister's clerk.  Enjoys reading.  [facility]   Social Determinants of Health   Financial Resource Strain: Not on file  Food Insecurity: Not on file  Transportation Needs: No Transportation Needs (01/31/2022)   PRAPARE - Hydrologist (Medical): No    [place] of Transportation (Non-Medical): No  Physical Activity: Not on file  Stress: Not on file  Social Connections: Not on file  Intimate Partner Violence: Not on file    FAMILY HISTORY: Family History  Problem Relation Age of Onset   Stroke Maternal Uncle    Stroke Maternal Uncle    Cancer Neg Hx     ALLERGIES:  is allergic to midazolam and midazolam hcl.  MEDICATIONS:  Current Outpatient Medications  Medication Sig Dispense Refill   acetaminophen (TYLENOL) 325 MG tablet Take 650 mg by mouth 3 (three) times daily.     aspirin EC 81 MG tablet Take 81 mg by mouth daily.     atorvastatin (LIPITOR) 80 MG tablet TAKE 1 TABLET BY MOUTH ONCE DAILY AT BEDTIME 90 tablet 2   Baclofen 5 MG TABS      divalproex (DEPAKOTE) 500 MG DR tablet Take 2 tablets (1,000 mg total) by mouth every 12 (twelve) hours. 360 tablet 3   DULoxetine (CYMBALTA) 30 MG capsule Take 1 capsule every night (Take with 60mg  capsule for total of 90mg  every night) 90 capsule 3   DULoxetine (CYMBALTA) 60 MG capsule Take 1 capsule every night (take with 30mg  capsule for total of 90mg  every night) 90 capsule 3   lacosamide (VIMPAT) 200 MG TABS tablet Take 1 tablet (200 mg  total) by mouth 2 (two) times daily. 180 tablet 3   lidocaine (XYLOCAINE) 1 % (with preservative) injection SMARTSIG:Rectally     loperamide (IMODIUM) 2 MG capsule Take by mouth.     metoprolol tartrate (LOPRESSOR) 25 MG tablet SMARTSIG:Tablet(s) By Mouth     mineral oil-hydrophilic petrolatum (AQUAPHOR) ointment Apply  as directed to affected area as directed  Apply to left ear topically every day shift related to [place] TISSUE (Z48.817)     Multiple Vitamins-Minerals (MULTI COMPLETE/IRON) TABS      mupirocin ointment (BACTROBAN) 2 % SMARTSIG:1 Application Topical 2-3 Times Daily     Propylene Glycol (SYSTANE BALANCE) 0.6 % SOLN Apply to eye.     Sennosides (SENNA) 8.6 MG CAPS Take 2 capsules by mouth daily.     sertraline (ZOLOFT) 50 MG tablet Take 1 tablet by mouth daily.  tamsulosin (FLOMAX) 0.4 MG CAPS capsule Take 0.4 mg by mouth daily.     topiramate (TOPAMAX) 25 MG tablet Take 25 mg by mouth daily.     topiramate (TOPAMAX) 50 MG tablet Take 1 and 1/2 tablets daily 135 tablet 3   White Petrolatum-Mineral Oil (LUBRIFRESH P.M. OP) Apply to eye.     Melatonin 3 MG CAPS Take 1 capsule by mouth at bedtime. (Patient not taking: Reported on 01/31/2022)     Melatonin 5 MG CAPS Take 1 capsule by mouth at bedtime. (Patient not taking: Reported on 01/31/2022)     No current facility-administered medications for this visit.     PHYSICAL EXAMINATION:   Vitals:   01/31/22 1400  BP: 113/78  Pulse: (!) 144  Resp: 18  Temp: (!) 96.6 F (35.9 C)  SpO2: 99%   Filed Weights   Physical Exam Vitals and nursing note reviewed.  HENT:     Head: Normocephalic and atraumatic.     Mouth/Throat:     Pharynx: Oropharynx is clear.  Eyes:     Extraocular Movements: Extraocular movements intact.     Pupils: Pupils are equal, round, and reactive to light.  Cardiovascular:     Rate and Rhythm: Normal rate and regular rhythm.   Pulmonary:     Comments: Decreased breath sounds bilaterally.  Abdominal:     Palpations: Abdomen is soft.  Musculoskeletal:        General: Normal range of motion.     Cervical back: Normal range of motion.  Skin:    General: Skin is warm.  Neurological:     General: No focal deficit present.     Mental Status: He is alert and oriented to person, place, and time.  Psychiatric:        Behavior: Behavior normal.        Judgment: Judgment normal.      LABORATORY DATA:  I have reviewed the data as listed Lab Results  Component Value Date   WBC 7.3 01/31/2022   HGB 17.1 (H) 01/31/2022   HCT 52.6 (H) 01/31/2022   MCV 90.4 01/31/2022   PLT 110 (L) 01/31/2022   Recent Labs    06/17/21 0642 12/10/21 1539 01/31/22 1530  NA 141 141 142  K 3.8 4.5 4.0  CL 111 109 108  CO2 24 25 25   GLUCOSE 136* 94 112*  BUN 11 14 18   CREATININE 0.84 0.82 0.86  CALCIUM 8.4* 8.9 8.7*  GFRNONAA >60 >60 >60  PROT 6.0* 6.6 6.4*  ALBUMIN 3.4* 3.7 3.7  AST 22 20 24   ALT 19 19 17   ALKPHOS 95 89 98  BILITOT 0.5 0.7 0.5     No results found.  ASSESSMENT & PLAN:   Thrombocytopenia # THROMBOCYTOPENIA- [90s hemoglobin- 15; WBC- wnl]   The etiology is unclear. ? Depakote. I reviewed with the patient and family the multiple causes of thrombocytopenia include-benign causes like liver disease/hypersplenism; vitamin deficiencies--including B 12 folic acid; viral infections-HIV/hepatitis; autoimmune causes; medications etc. I also reviewed malignant causes include: leukemias-MDS/lymphomas  and rare infiltration of the bone marrow with carcinomas.  However very low clinical suspicion of any acute process.Discussed the possible need for a bone marrow biopsy if above work-up is unrevealing; all labs are progressively getting worse.  Also discussed regarding imaging with ultrasound.  # Recommend blood work-CBC CMP LDH hepatitis panel;HIV; iron studies ferritin; reticulocyte count review of peripheral smear;  B12 folate acid.  Ultrasound liver.  # Stroke left side weakness [2104 &  2020]/ seizures- depakote/vimpat/Topomax- asprin- STABLE.   Thank you Dr. Kellie Simmering for allowing me to participate in the care of your pleasant patient. Please do not hesitate to contact me with questions or concerns in the interim.  The above plan of care was discussed with patient and his sister in detail.   # DISPOSITION: # labs today- ordered # follow up in 3 weeks; MD; No labs; [place] prior- Dr.B  All questions were answered. The patient knows to call the clinic with any problems, questions or concerns.   Cammie Sickle, MD 02/14/2022 2:49 PM

## 2022-02-07 ENCOUNTER — Encounter: Payer: Self-pay | Admitting: Neurology

## 2022-02-14 ENCOUNTER — Ambulatory Visit
Admission: RE | Admit: 2022-02-14 | Discharge: 2022-02-14 | Disposition: A | Discharge status: Home / Self Care | Payer: Medicare Other | Source: Ambulatory Visit | Attending: Internal Medicine | Admitting: Internal Medicine

## 2022-02-14 DIAGNOSIS — D696 Thrombocytopenia, unspecified: Secondary | ICD-10-CM | POA: Diagnosis not present

## 2022-02-23 ENCOUNTER — Inpatient Hospital Stay: Payer: Medicare Other | Admitting: Internal Medicine

## 2022-02-23 ENCOUNTER — Ambulatory Visit: Payer: Medicare Other | Admitting: Internal Medicine

## 2022-03-22 ENCOUNTER — Inpatient Hospital Stay: Payer: Medicare Other | Attending: Internal Medicine | Admitting: Internal Medicine

## 2022-03-22 ENCOUNTER — Encounter: Payer: Self-pay | Admitting: Internal Medicine

## 2022-03-22 DIAGNOSIS — D696 Thrombocytopenia, unspecified: Secondary | ICD-10-CM | POA: Diagnosis not present

## 2022-03-22 DIAGNOSIS — Z8673 Personal history of transient ischemic attack (TIA), and cerebral infarction without residual deficits: Secondary | ICD-10-CM | POA: Diagnosis not present

## 2022-03-22 NOTE — Addendum Note (Signed)
Addended by: Vanice Sarah on: 03/22/2022 03:45 PM   Modules accepted: Orders

## 2022-03-22 NOTE — Assessment & Plan Note (Signed)
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 JAN 2024- platelets > 100.  January [id number] B 12 folic acid; viral infections-HIV/hepatitis-negative Jan 2024 ultrasound negative for splenomegaly/or cirrhosis.  Platelets at 110 no contraindication to aspirin.  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  Discussed that could consider treatment options of platelets 50 or less.  Recommend continued surveillance every 6 months labs.  # Incidental-February 2024 ultrasound increased hepatic parenchymal echogenicity suggestive of steatosis.-Suggestive of fatty liver.  Likely secondary to obesity.  Defer to patient's PCP.  # Stroke left side weakness [2104 & 2020]/ seizures- depakote/vimpat/Topomax- asprin- stable.   # DISPOSITION: # follow up in 6 months- MD; labs- cbc/cmp;  Dr.B

## 2022-03-22 NOTE — Progress Notes (Signed)
I connected with [name] on 03/22/22 at  3:15 PM EST by video enabled telemedicine visit and verified that I am speaking with the correct person using two identifiers.  I discussed the limitations, risks, security and privacy concerns of performing an evaluation and management service by telemedicine and the availability of in-person appointments. I also discussed with the patient that there may be a patient responsible charge related to this service. The patient expressed understanding and agreed to proceed.    Other persons participating in the visit and their role in the encounter: RN/medical reconciliation Patient's location: home Provider's location: office  Oncology History   No history exists.   Chief Complaint: Thrombocytopenia   History of present illness:Patrick Franklin 52 y.o.  male with history of pleasant patient history of stroke right-sided weakness is here for follow-up of thrombocytopenia.  Reviewed blood work.  Patient has intermittent dizziness.  No falls.  No bleeding.    Hx of constipation-relieved with Senokot.  Observation/objective: Alert & oriented x 3. In No acute distress.  Patient accompanied by family.  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 JAN 2024- platelets > 100.  January 123XX123 B 12 folic acid; viral infections-HIV/hepatitis-negative Jan 2024 ultrasound negative for splenomegaly/or cirrhosis.  Platelets at 110 no contraindication to aspirin.  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  Discussed that could consider treatment options of platelets 50 or less.  Recommend continued surveillance every 6 months labs.  # Incidental-February 2024 ultrasound increased hepatic parenchymal echogenicity suggestive of steatosis.-Suggestive of fatty liver.  Likely secondary to obesity.  Defer to patient's PCP.  # Stroke left side weakness [2104 &  2020]/ seizures- depakote/vimpat/Topomax- asprin- stable.   # DISPOSITION: # follow up in 6 months- MD; labs- cbc/cmp;  Dr.B  Follow-up instructions:  I discussed the assessment and treatment plan with the patient.  The patient was provided an opportunity to ask questions and all were answered.  The patient agreed with the plan and demonstrated understanding of instructions.  The patient was advised to call back or seek an in person evaluation if the symptoms worsen or if the condition fails to improve as anticipated.  Dr. Charlaine Dalton [place] at [facility] 03/22/2022 3:32 PM

## 2022-03-22 NOTE — Progress Notes (Signed)
Patient verified using two identifiers for virtual visit via telephone today.  Patient denies new problems/concerns today.   

## 2022-04-01 ENCOUNTER — Encounter: Payer: Self-pay | Admitting: Neurology

## 2022-04-18 ENCOUNTER — Ambulatory Visit (INDEPENDENT_AMBULATORY_CARE_PROVIDER_SITE_OTHER): Payer: 59 | Admitting: Neurology

## 2022-04-18 ENCOUNTER — Encounter: Payer: Self-pay | Admitting: Neurology

## 2022-04-18 VITALS — BP 98/66 | HR 78

## 2022-04-18 DIAGNOSIS — R251 Tremor, unspecified: Secondary | ICD-10-CM | POA: Diagnosis not present

## 2022-04-18 DIAGNOSIS — R4189 Other symptoms and signs involving cognitive functions and awareness: Secondary | ICD-10-CM | POA: Diagnosis not present

## 2022-04-18 DIAGNOSIS — I639 Cerebral infarction, unspecified: Secondary | ICD-10-CM

## 2022-04-18 DIAGNOSIS — G40209 Localization-related (focal) (partial) symptomatic epilepsy and epileptic syndromes with complex partial seizures, not intractable, without status epilepticus: Secondary | ICD-10-CM | POA: Diagnosis not present

## 2022-04-18 DIAGNOSIS — F0390 Unspecified dementia without behavioral disturbance: Secondary | ICD-10-CM

## 2022-04-18 DIAGNOSIS — R4689 Other symptoms and signs involving appearance and behavior: Secondary | ICD-10-CM
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
## 2022-04-18 NOTE — Patient Instructions (Signed)
Good to see you.  Schedule head CT without contrast at [facility]  2. Give increase in Sertraline dose a few more weeks. We may plan to further increase Topamax dose in the future  3. Continue all your other medications  4. Follow-up in 3 months, call for any changes

## 2022-04-20 ENCOUNTER — Telehealth: Payer: Self-pay

## 2022-04-20 NOTE — Telephone Encounter (Signed)
Pt sister called an informed that [facility] was scheduled for 4/4 at 1:30 he needs to be there at 1pm. She will be out of town that week she is going to call central scheduling will call to reschedule

## 2022-04-21 ENCOUNTER — Ambulatory Visit: Payer: Medicare Other | Admitting: Neurology

## 2022-04-28 ENCOUNTER — Other Ambulatory Visit: Payer: Medicare Other

## 2022-05-05 ENCOUNTER — Telehealth: Payer: Self-pay | Admitting: Neurology

## 2022-05-05 ENCOUNTER — Ambulatory Visit
Admission: RE | Admit: 2022-05-05 | Discharge: 2022-05-05 | Disposition: A | Discharge status: Home / Self Care | Payer: Medicare Other | Source: Ambulatory Visit | Attending: Neurology | Admitting: Neurology

## 2022-05-05 DIAGNOSIS — R4189 Other symptoms and signs involving cognitive functions and awareness: Secondary | ICD-10-CM | POA: Insufficient documentation

## 2022-05-05 DIAGNOSIS — G40209 Localization-related (focal) (partial) symptomatic epilepsy and epileptic syndromes with complex partial seizures, not intractable, without status epilepticus: Secondary | ICD-10-CM | POA: Insufficient documentation

## 2022-05-05 DIAGNOSIS — R4689 Other symptoms and signs involving appearance and behavior: Secondary | ICD-10-CM | POA: Diagnosis present

## 2022-05-05 DIAGNOSIS — I639 Cerebral infarction, unspecified: Secondary | ICD-10-CM | POA: Insufficient documentation

## 2022-05-05 DIAGNOSIS — F0390 Unspecified dementia without behavioral disturbance: Secondary | ICD-10-CM | POA: Diagnosis present

## 2022-05-05 DIAGNOSIS — R251 Tremor, unspecified: Secondary | ICD-10-CM | POA: Diagnosis present

## 2022-05-05 DIAGNOSIS — R9089 Other abnormal findings on diagnostic imaging of central nervous system: Secondary | ICD-10-CM

## 2022-05-05 NOTE — Telephone Encounter (Signed)
I took a call from radiology on this patient who sees Dr. Karel Jarvis. They called with a critical result of CT head performed today (05/05/22). Patient has history of right MCA stroke and hemicraniotomy. Per radiology, patient's flap is significantly more sunken than previous and that frontal lobe was being pinched. They recommended stat consult to NSGY.  This will be placed today. Patient is currently in radiology, so will be contacted about result when available.  Jacquelyne Balint, MD [facility] Neurology

## 2022-05-05 NOTE — Telephone Encounter (Signed)
Pt sister called informed that patient's flap is significantly more sunken than previous and that frontal lobe was being pinched. They recommended stat consult to NSGY. Surgery consult ordered and faxed

## 2022-05-05 NOTE — Addendum Note (Signed)
Addended by: Dimas Chyle on: 05/05/2022 03:54 PM   Modules accepted: Orders

## 2022-05-09 ENCOUNTER — Encounter: Payer: Self-pay | Admitting: Neurology

## 2022-05-16 ENCOUNTER — Other Ambulatory Visit: Payer: Self-pay | Admitting: Neurosurgery

## 2022-05-20 NOTE — Pre-Procedure Instructions (Addendum)
Surgical Instructions    Your procedure is scheduled on May 24, 2022.  Report to [facility] Main Entrance "A" at 8:00 A.M., then check in with the Admitting office.  Call this number if you have problems the morning of surgery:  [phone]  If you have any questions prior to your surgery date call [phone]: Open Monday-Friday 8am-4pm If you experience any cold or flu symptoms such as cough, fever, chills, shortness of breath, etc. between now and your scheduled surgery, please notify us at the above number.     Remember:  Do not eat or drink after midnight the night before your surgery     Take these medicines the morning of surgery with A SIP OF WATER:  Baclofen   divalproex (DEPAKOTE)   DULoxetine (CYMBALTA)   lacosamide (VIMPAT)   metoprolol tartrate (LOPRESSOR)   Polyethyl Glycol-Propyl Glycol (SYSTANE) eye drops  sertraline (ZOLOFT)   tamsulosin (FLOMAX)   topiramate (TOPAMAX)    May take these medicines IF NEEDED:  ipratropium-albuterol (DUONEB) nebulizer   As of today, STOP taking any Aspirin (unless otherwise instructed by your surgeon) Aleve, Naproxen, Ibuprofen, Motrin, Advil, Goody's, BC's, all herbal medications, fish oil, and all vitamins.                     Do NOT Smoke (Tobacco/Vaping) for 24 hours prior to your procedure.  If you use a CPAP at night, you may bring your mask/headgear for your overnight stay.   Contacts, glasses, piercing's, hearing aid's, dentures or partials may not be worn into surgery, please bring cases for these belongings.    For patients admitted to the hospital, discharge time will be determined by your treatment team.   Patients discharged the day of surgery will not be allowed to drive home, and someone needs to stay with them for 24 hours.  SURGICAL WAITING ROOM VISITATION Patients having surgery or a procedure may have no more than 2 support people in the waiting area - these visitors may rotate.   Children under the  age of 29 must have an adult with them who is not the patient. If the patient needs to stay at the hospital during part of their recovery, the visitor guidelines for inpatient rooms apply. Pre-op nurse will coordinate an appropriate time for 1 support person to accompany patient in pre-op.  This support person may not rotate.   Please refer to the [facility] website for the visitor guidelines for Inpatients (after your surgery is over and you are in a regular room).    Special instructions:   [facility]- Preparing For Surgery  Before surgery, you can play an important role. Because skin is not sterile, your skin needs to be as free of germs as possible. You can reduce the number of germs on your skin by washing with CHG (chlorahexidine gluconate) Soap before surgery.  CHG is an antiseptic cleaner which kills germs and bonds with the skin to continue killing germs even after washing.    Oral Hygiene is also important to reduce your risk of infection.  Remember - BRUSH YOUR TEETH THE MORNING OF SURGERY WITH YOUR REGULAR TOOTHPASTE  Please do not use if you have an allergy to CHG or antibacterial soaps. If your skin becomes reddened/irritated stop using the CHG.  Do not shave (including legs and underarms) for at least 48 hours prior to first CHG shower. It is OK to shave your face.  Please follow these instructions carefully.   Shower  the NIGHT BEFORE SURGERY and the MORNING OF SURGERY  If you chose to wash your hair, wash your hair first as usual with your normal shampoo.  After you shampoo, rinse your hair and body thoroughly to remove the shampoo.  Use CHG Soap as you would any other liquid soap. You can apply CHG directly to the skin and wash gently with a scrungie or a clean washcloth.   Apply the CHG Soap to your body ONLY FROM THE NECK DOWN.  Do not use on open wounds or open sores. Avoid contact with your eyes, ears, mouth and genitals (private parts). Wash Face and genitals  (private parts)  with your normal soap.   Wash thoroughly, paying special attention to the area where your surgery will be performed.  Thoroughly rinse your body with warm water from the neck down.  DO NOT shower/wash with your normal soap after using and rinsing off the CHG Soap.  Pat yourself dry with a CLEAN TOWEL.  Wear CLEAN PAJAMAS to bed the night before surgery  Place CLEAN SHEETS on your bed the night before your surgery  DO NOT SLEEP WITH PETS.   Day of Surgery: Take a shower with CHG soap. Do not wear jewelry or makeup Do not wear lotions, powders, perfumes/colognes, or deodorant. Do not shave 48 hours prior to surgery.  Men may shave face and neck. Do not bring valuables to the hospital.  [facility] is not responsible for any belongings or valuables. Do not wear nail polish, gel polish, artificial nails, or any other type of covering on natural nails (fingers and toes) If you have artificial nails or gel coating that need to be removed by a nail salon, please have this removed prior to surgery. Artificial nails or gel coating may interfere with anesthesia's ability to adequately monitor your vital signs.  Wear Clean/Comfortable clothing the morning of surgery Remember to brush your teeth WITH YOUR REGULAR TOOTHPASTE.   Please read over the following fact sheets that you were given.    If you received a COVID test during your pre-op visit  it is requested that you wear a mask when out in public, stay away from anyone that may not be feeling well and notify your surgeon if you develop symptoms. If you have been in contact with anyone that has tested positive in the last 10 days please notify you surgeon.

## 2022-05-23 ENCOUNTER — Other Ambulatory Visit: Payer: Self-pay

## 2022-05-23 ENCOUNTER — Encounter (HOSPITAL_COMMUNITY)
Admission: RE | Admit: 2022-05-23 | Discharge: 2022-05-23 | Disposition: A | Discharge status: Home / Self Care | Payer: Medicare Other | Source: Ambulatory Visit | Attending: Neurosurgery | Admitting: Neurosurgery

## 2022-05-23 ENCOUNTER — Encounter (HOSPITAL_COMMUNITY): Payer: Self-pay

## 2022-05-23 VITALS — BP 111/78 | HR 62 | Temp 98.0°F | Resp 18 | Ht 70.0 in | Wt 183.0 lb

## 2022-05-23 DIAGNOSIS — Z01812 Encounter for preprocedural laboratory examination: Secondary | ICD-10-CM | POA: Insufficient documentation

## 2022-05-23 DIAGNOSIS — I10 Essential (primary) hypertension: Secondary | ICD-10-CM | POA: Insufficient documentation

## 2022-05-23 DIAGNOSIS — Z8673 Personal history of transient ischemic attack (TIA), and cerebral infarction without residual deficits: Secondary | ICD-10-CM | POA: Insufficient documentation

## 2022-05-23 DIAGNOSIS — D696 Thrombocytopenia, unspecified: Secondary | ICD-10-CM | POA: Insufficient documentation

## 2022-05-23 DIAGNOSIS — Z01818 Encounter for other preprocedural examination: Secondary | ICD-10-CM

## 2022-05-23 LAB — BASIC METABOLIC PANEL
Anion gap: 7 (ref 5–15)
BUN: 14 mg/dL (ref 6–20)
CO2: 26 mmol/L (ref 22–32)
Calcium: 8.7 mg/dL — ABNORMAL LOW (ref 8.9–10.3)
Chloride: 109 mmol/L (ref 98–111)
Creatinine, Ser: 0.89 mg/dL (ref 0.61–1.24)
GFR, Estimated: >60 mL/min (ref >60)
Glucose, Bld: 99 mg/dL (ref 70–99)
Potassium: 3.8 mmol/L (ref 3.5–5.1)
Sodium: 142 mmol/L (ref 135–145)

## 2022-05-23 LAB — CBC
HCT: 49 % (ref 39.0–52.0)
Hemoglobin: 15.3 g/dL (ref 13.0–17.0)
MCH: 30.2 pg (ref 26.0–34.0)
MCHC: 31.2 g/dL (ref 30.0–36.0)
MCV: 96.8 fL (ref 80.0–100.0)
Platelets: 81 10*3/uL — ABNORMAL LOW (ref 150–400)
RBC: 5.06 MIL/uL (ref 4.22–5.81)
RDW: 14 % (ref 11.5–15.5)
WBC: 4.9 10*3/uL (ref 4.0–10.5)
nRBC: 0 % (ref 0.0–0.2)

## 2022-05-23 LAB — SURGICAL PCR SCREEN
MRSA, PCR: NEGATIVE
Staphylococcus aureus: NEGATIVE

## 2022-05-23 NOTE — Progress Notes (Signed)
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  He is followed by neurologist Dr. Karel Jarvis.  Last seen 04/18/2022 and noted to have significant recent behavioral changes as well as new right resting tremor with cogwheeling.  He was continued on current medications, lacosamide, divalproex, topiramate, sertraline, 81 mg ASA.  Reportedly no seizures since 2020.  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  Labs reviewed, thrombocytopenia with platelets 81k (baseline ~100k), otherwise unremarkable.  EKG 01/31/2022: Sinus tachycardia.  Rate 137.  Possible LAE.  Septal infarct, age undetermined.  TTE 10/02/2018:  1. The left ventricle has normal systolic function, with an ejection  fraction of 55-60%. The cavity size was normal. Left ventricular diastolic  Doppler parameters are consistent with impaired relaxation. No evidence of  left ventricular regional wall  motion abnormalities.   2. The right ventricle has normal systolic function. The cavity was  normal. There is no increase in right ventricular wall thickness.   3. The aortic valve is tricuspid. No stenosis of the aortic valve.   4. The aortic root is normal in size and structure.   5. The interatrial septum was not well visualized.   6. The mitral valve was not well visualized. No evidence of mitral valve  stenosis. No significant mitral regurgitation noted.   7. The tricuspid valve is not well visualized. Tricuspid valve  regurgitation was not assessed by color flow Doppler.   8. The IVC was not visualized. No complete TR doppler jet so unable to  estimate PA systolic pressure.   9. Technically difficult study with very poor images.      Zannie Cove [facility] Short  Stay Center/Anesthesiology Phone [phone] 05/23/2022 11:26 AM]

## 2022-05-23 NOTE — Anesthesia Preprocedure Evaluation (Signed)
Anesthesia Evaluation  Patient identified by MRN, date of birth, ID band Patient awake    Reviewed: Allergy & Precautions, NPO status , Patient's Chart, lab work & pertinent test results  Airway Mallampati: IV  TM Distance: >3 FB Neck ROM: Full    Dental  (+) Teeth Intact, Dental Advisory Given   Pulmonary asthma , former smoker, PE (2017)   Pulmonary exam normal breath sounds clear to auscultation       Cardiovascular hypertension (132/71 preop), Pt. on medications and Pt. on home beta blockers + DVT  Normal cardiovascular exam Rhythm:Regular Rate:Normal  Echo 2020  1. The left ventricle has normal systolic function, with an ejection  fraction of 55-60%. The cavity size was normal. Left ventricular diastolic  Doppler parameters are consistent with impaired relaxation. No evidence of  left ventricular regional wall  motion abnormalities.   2. The right ventricle has normal systolic function. The cavity was  normal. There is no increase in right ventricular wall thickness.   3. The aortic valve is tricuspid. No stenosis of the aortic valve.   4. The aortic root is normal in size and structure.   5. The interatrial septum was not well visualized.   6. The mitral valve was not well visualized. No evidence of mitral valve  stenosis. No significant mitral regurgitation noted.   7. The tricuspid valve is not well visualized. Tricuspid valve  regurgitation was not assessed by color flow Doppler.   8. The IVC was not visualized. No complete TR doppler jet so unable to  estimate PA systolic pressure.   9. Technically difficult study with very poor images.     Neuro/Psych  Headaches, Seizures -, Well Controlled,  PSYCHIATRIC DISORDERS  Depression   Dementia CVA 2020 Skull defect  Spastic hemiplegia  large right MCA 2017 stroke with subsequent seizures, then a left MCA stroke secondary to left mid-ICA short segment high grade  stenosis.  He is followed by neurologist Dr. Karel Jarvis.  Last seen 04/18/2022 and noted to have significant recent behavioral changes as well as new right resting tremor with cogwheeling.  He was continued on current medications, lacosamide, divalproex, topiramate, sertraline, 81 mg ASA.  Reportedly no seizures since 2020. CVA, Residual Symptoms    GI/Hepatic Neg liver ROS,GERD  Controlled,,  Endo/Other  negative endocrine ROS    Renal/GU negative Renal ROS  negative genitourinary   Musculoskeletal negative musculoskeletal ROS (+)    Abdominal   Peds  Hematology  (+) Blood dyscrasia ITP last plt 81   Anesthesia Other Findings   Reproductive/Obstetrics negative OB ROS                             Anesthesia Physical Anesthesia Plan  ASA: 3  Anesthesia Plan: General   Post-op Pain Management: Ofirmev IV (intra-op)* and Dilaudid IV   Induction: Intravenous  PONV Risk Score and Plan: 3 and Ondansetron, Dexamethasone, Midazolam and Treatment may vary due to age or medical condition  Airway Management Planned: Oral ETT  Additional Equipment: None  Intra-op Plan:   Post-operative Plan: Extubation in OR  Informed Consent: I have reviewed the patients History and Physical, chart, labs and discussed the procedure including the risks, benefits and alternatives for the proposed anesthesia with the patient or authorized representative who has indicated his/her understanding and acceptance.     Dental advisory given  Plan Discussed with: CRNA  Anesthesia Plan Comments: (  )  Anesthesia Quick Evaluation  

## 2022-05-23 NOTE — Progress Notes (Signed)
PCP - Dr. Charlott Rakes, [facility] Cardiologist - Denies Neurologist: Dr. Karel Jarvis  PPM/ICD - No pacemaker or defib.  Patient has VP shunt, IVC filter and Peg tube  Chest x-ray - 06/17/2021 EKG - 01/31/2022 Stress Test -  ECHO - 10/02/2018 Cardiac Cath -   Sleep Study - Denies CPAP - Denies  Non-diabetic  Blood Thinner Instructions: Denies Aspirin Instructions: Last Dose 05/17/2022  ERAS Protcol -No, NPO PRE-SURGERY Ensure or G2-   COVID TEST- n/a  Anesthesia review: Yes. HTN, cardiomegaly,CVA, hemiparesis left side, asthma, seizure disorder  Patient denies shortness of breath, fever, cough and chest pain at PAT appointment   All instructions explained to the patient, with a verbal understanding of the material. Patient agrees to go over the instructions while at home for a better understanding. Patient also instructed to self quarantine after being tested for COVID-19. The opportunity to ask questions was provided.

## 2022-05-24 ENCOUNTER — Inpatient Hospital Stay (HOSPITAL_COMMUNITY): Payer: Medicare Other

## 2022-05-24 ENCOUNTER — Encounter (HOSPITAL_COMMUNITY): Admission: RE | Disposition: E | Payer: Self-pay | Source: Home / Self Care | Attending: Neurosurgery

## 2022-05-24 ENCOUNTER — Encounter (HOSPITAL_COMMUNITY): Payer: Self-pay | Admitting: Neurosurgery

## 2022-05-24 ENCOUNTER — Other Ambulatory Visit: Payer: Self-pay

## 2022-05-24 ENCOUNTER — Inpatient Hospital Stay (HOSPITAL_COMMUNITY): Payer: Medicare Other | Admitting: Physician Assistant

## 2022-05-24 ENCOUNTER — Inpatient Hospital Stay (HOSPITAL_COMMUNITY)
Admission: RE | Admit: 2022-05-24 | Discharge: 2022-06-25 | DRG: 907 | Disposition: E | Payer: Medicare Other | Attending: Neurosurgery | Admitting: Neurosurgery

## 2022-05-24 DIAGNOSIS — Z86711 Personal history of pulmonary embolism: Secondary | ICD-10-CM

## 2022-05-24 DIAGNOSIS — T86838 Other complications of bone graft: Secondary | ICD-10-CM | POA: Diagnosis present

## 2022-05-24 DIAGNOSIS — J188 Other pneumonia, unspecified organism: Secondary | ICD-10-CM | POA: Diagnosis not present

## 2022-05-24 DIAGNOSIS — R251 Tremor, unspecified: Secondary | ICD-10-CM | POA: Diagnosis present

## 2022-05-24 DIAGNOSIS — Z87442 Personal history of urinary calculi: Secondary | ICD-10-CM

## 2022-05-24 DIAGNOSIS — D696 Thrombocytopenia, unspecified: Secondary | ICD-10-CM | POA: Diagnosis present

## 2022-05-24 DIAGNOSIS — G40501 Epileptic seizures related to external causes, not intractable, with status epilepticus: Secondary | ICD-10-CM | POA: Diagnosis not present

## 2022-05-24 DIAGNOSIS — R579 Shock, unspecified: Secondary | ICD-10-CM

## 2022-05-24 DIAGNOSIS — I69398 Other sequelae of cerebral infarction: Secondary | ICD-10-CM

## 2022-05-24 DIAGNOSIS — Z87891 Personal history of nicotine dependence: Secondary | ICD-10-CM

## 2022-05-24 DIAGNOSIS — G9340 Encephalopathy, unspecified: Secondary | ICD-10-CM | POA: Diagnosis not present

## 2022-05-24 DIAGNOSIS — R569 Unspecified convulsions: Secondary | ICD-10-CM | POA: Diagnosis not present

## 2022-05-24 DIAGNOSIS — J9601 Acute respiratory failure with hypoxia: Secondary | ICD-10-CM

## 2022-05-24 DIAGNOSIS — E781 Pure hyperglyceridemia: Secondary | ICD-10-CM | POA: Diagnosis present

## 2022-05-24 DIAGNOSIS — R519 Headache, unspecified: Secondary | ICD-10-CM | POA: Diagnosis present

## 2022-05-24 DIAGNOSIS — D62 Acute posthemorrhagic anemia: Secondary | ICD-10-CM | POA: Diagnosis not present

## 2022-05-24 DIAGNOSIS — J45909 Unspecified asthma, uncomplicated: Secondary | ICD-10-CM

## 2022-05-24 DIAGNOSIS — Z9889 Other specified postprocedural states: Principal | ICD-10-CM

## 2022-05-24 DIAGNOSIS — I69354 Hemiplegia and hemiparesis following cerebral infarction affecting left non-dominant side: Secondary | ICD-10-CM | POA: Diagnosis not present

## 2022-05-24 DIAGNOSIS — T84328A Displacement of other bone devices, implants and grafts, initial encounter: Secondary | ICD-10-CM | POA: Diagnosis present

## 2022-05-24 DIAGNOSIS — Z993 Dependence on wheelchair: Secondary | ICD-10-CM

## 2022-05-24 DIAGNOSIS — Z823 Family history of stroke: Secondary | ICD-10-CM

## 2022-05-24 DIAGNOSIS — G9349 Other encephalopathy: Secondary | ICD-10-CM | POA: Diagnosis not present

## 2022-05-24 DIAGNOSIS — Y95 Nosocomial condition: Secondary | ICD-10-CM | POA: Diagnosis present

## 2022-05-24 DIAGNOSIS — N170 Acute kidney failure with tubular necrosis: Secondary | ICD-10-CM | POA: Diagnosis not present

## 2022-05-24 DIAGNOSIS — K068 Other specified disorders of gingiva and edentulous alveolar ridge: Secondary | ICD-10-CM | POA: Diagnosis not present

## 2022-05-24 DIAGNOSIS — I69891 Dysphagia following other cerebrovascular disease: Secondary | ICD-10-CM

## 2022-05-24 DIAGNOSIS — M952 Other acquired deformity of head: Secondary | ICD-10-CM | POA: Diagnosis present

## 2022-05-24 DIAGNOSIS — I6389 Other cerebral infarction: Secondary | ICD-10-CM | POA: Diagnosis not present

## 2022-05-24 DIAGNOSIS — E872 Acidosis, unspecified: Secondary | ICD-10-CM | POA: Diagnosis not present

## 2022-05-24 DIAGNOSIS — Z515 Encounter for palliative care: Secondary | ICD-10-CM

## 2022-05-24 DIAGNOSIS — Z86718 Personal history of other venous thrombosis and embolism: Secondary | ICD-10-CM

## 2022-05-24 DIAGNOSIS — Z888 Allergy status to other drugs, medicaments and biological substances status: Secondary | ICD-10-CM

## 2022-05-24 DIAGNOSIS — G919 Hydrocephalus, unspecified: Secondary | ICD-10-CM | POA: Diagnosis not present

## 2022-05-24 DIAGNOSIS — Z8673 Personal history of transient ischemic attack (TIA), and cerebral infarction without residual deficits: Secondary | ICD-10-CM | POA: Diagnosis not present

## 2022-05-24 DIAGNOSIS — Z982 Presence of cerebrospinal fluid drainage device: Secondary | ICD-10-CM | POA: Diagnosis not present

## 2022-05-24 DIAGNOSIS — H53469 Homonymous bilateral field defects, unspecified side: Secondary | ICD-10-CM | POA: Diagnosis present

## 2022-05-24 DIAGNOSIS — Z4541 Encounter for adjustment and management of cerebrospinal fluid drainage device: Secondary | ICD-10-CM

## 2022-05-24 DIAGNOSIS — R6521 Severe sepsis with septic shock: Secondary | ICD-10-CM | POA: Diagnosis not present

## 2022-05-24 DIAGNOSIS — E876 Hypokalemia: Secondary | ICD-10-CM | POA: Diagnosis present

## 2022-05-24 DIAGNOSIS — H5704 Mydriasis: Secondary | ICD-10-CM | POA: Diagnosis not present

## 2022-05-24 DIAGNOSIS — I69392 Facial weakness following cerebral infarction: Secondary | ICD-10-CM

## 2022-05-24 DIAGNOSIS — I499 Cardiac arrhythmia, unspecified: Secondary | ICD-10-CM | POA: Diagnosis not present

## 2022-05-24 DIAGNOSIS — A419 Sepsis, unspecified organism: Secondary | ICD-10-CM

## 2022-05-24 DIAGNOSIS — G936 Cerebral edema: Secondary | ICD-10-CM | POA: Diagnosis present

## 2022-05-24 DIAGNOSIS — I708 Atherosclerosis of other arteries: Secondary | ICD-10-CM | POA: Diagnosis present

## 2022-05-24 DIAGNOSIS — Z66 Do not resuscitate: Secondary | ICD-10-CM | POA: Diagnosis not present

## 2022-05-24 DIAGNOSIS — Z7189 Other specified counseling: Secondary | ICD-10-CM | POA: Diagnosis not present

## 2022-05-24 DIAGNOSIS — I10 Essential (primary) hypertension: Secondary | ICD-10-CM

## 2022-05-24 DIAGNOSIS — R601 Generalized edema: Secondary | ICD-10-CM | POA: Diagnosis not present

## 2022-05-24 DIAGNOSIS — K219 Gastro-esophageal reflux disease without esophagitis: Secondary | ICD-10-CM | POA: Diagnosis present

## 2022-05-24 DIAGNOSIS — F0150 Vascular dementia without behavioral disturbance: Secondary | ICD-10-CM | POA: Diagnosis present

## 2022-05-24 DIAGNOSIS — N319 Neuromuscular dysfunction of bladder, unspecified: Secondary | ICD-10-CM | POA: Diagnosis present

## 2022-05-24 DIAGNOSIS — N5089 Other specified disorders of the male genital organs: Secondary | ICD-10-CM | POA: Diagnosis not present

## 2022-05-24 DIAGNOSIS — G9389 Other specified disorders of brain: Secondary | ICD-10-CM | POA: Diagnosis not present

## 2022-05-24 DIAGNOSIS — Z79899 Other long term (current) drug therapy: Secondary | ICD-10-CM

## 2022-05-24 DIAGNOSIS — G40901 Epilepsy, unspecified, not intractable, with status epilepticus: Secondary | ICD-10-CM

## 2022-05-24 DIAGNOSIS — J690 Pneumonitis due to inhalation of food and vomit: Secondary | ICD-10-CM | POA: Diagnosis not present

## 2022-05-24 DIAGNOSIS — R6520 Severe sepsis without septic shock: Secondary | ICD-10-CM | POA: Diagnosis not present

## 2022-05-24 DIAGNOSIS — I952 Hypotension due to drugs: Secondary | ICD-10-CM | POA: Diagnosis not present

## 2022-05-24 HISTORY — PX: CRANIOPLASTY: SHX1407

## 2022-05-24 LAB — POCT I-STAT 7, (LYTES, BLD GAS, ICA,H+H)
Acid-base deficit: 8 mmol/L — ABNORMAL HIGH (ref 0.0–2.0)
Bicarbonate: 17.7 mmol/L — ABNORMAL LOW (ref 20.0–28.0)
Calcium, Ion: 1.14 mmol/L — ABNORMAL LOW (ref 1.15–1.40)
HCT: 36 % — ABNORMAL LOW (ref 39.0–52.0)
Hemoglobin: 12.2 g/dL — ABNORMAL LOW (ref 13.0–17.0)
O2 Saturation: 85 %
Patient temperature: 36.4
Potassium: 3.7 mmol/L (ref 3.5–5.1)
Sodium: 142 mmol/L (ref 135–145)
TCO2: 19 mmol/L — ABNORMAL LOW (ref 22–32)
pCO2 arterial: 36.9 mmHg (ref 32–48)
pH, Arterial: 7.287 — ABNORMAL LOW (ref 7.35–7.45)
pO2, Arterial: 54 mmHg — ABNORMAL LOW (ref 83–108)

## 2022-05-24 LAB — CBC WITH DIFFERENTIAL/PLATELET
Abs Immature Granulocytes: 0.2 10*3/uL — ABNORMAL HIGH (ref 0.00–0.07)
Basophils Absolute: 0 10*3/uL (ref 0.0–0.1)
Basophils Relative: 0 %
Eosinophils Absolute: 0 10*3/uL (ref 0.0–0.5)
Eosinophils Relative: 0 %
HCT: 40.9 % (ref 39.0–52.0)
Hemoglobin: 13 g/dL (ref 13.0–17.0)
Immature Granulocytes: 1 %
Lymphocytes Relative: 18 %
Lymphs Abs: 3.3 10*3/uL (ref 0.7–4.0)
MCH: 30.7 pg (ref 26.0–34.0)
MCHC: 31.8 g/dL (ref 30.0–36.0)
MCV: 96.5 fL (ref 80.0–100.0)
Monocytes Absolute: 1.9 10*3/uL — ABNORMAL HIGH (ref 0.1–1.0)
Monocytes Relative: 10 %
Neutro Abs: 13.4 10*3/uL — ABNORMAL HIGH (ref 1.7–7.7)
Neutrophils Relative %: 71 %
Platelets: 188 10*3/uL (ref 150–400)
RBC: 4.24 MIL/uL (ref 4.22–5.81)
RDW: 13.7 % (ref 11.5–15.5)
WBC: 18.9 10*3/uL — ABNORMAL HIGH (ref 4.0–10.5)
nRBC: 0 % (ref 0.0–0.2)

## 2022-05-24 LAB — GLUCOSE, CAPILLARY
Glucose-Capillary: 126 mg/dL — ABNORMAL HIGH (ref 70–99)
Glucose-Capillary: 187 mg/dL — ABNORMAL HIGH (ref 70–99)

## 2022-05-24 LAB — LACTIC ACID, PLASMA
Lactic Acid, Venous: 5.9 mmol/L (ref 0.5–1.9)
Lactic Acid, Venous: 4.9 mmol/L (ref 0.5–1.9)

## 2022-05-24 LAB — ABO/RH: ABO/RH(D): O POS

## 2022-05-24 SURGERY — CRANIOPLASTY
Anesthesia: General | Laterality: Right

## 2022-05-24 MED ORDER — LEVETIRACETAM IN NACL 500 MG/100ML IV SOLN
500.0000 mg | INTRAVENOUS | Status: AC
  Administered 2022-05-24: 500 mg via INTRAVENOUS
  Filled 2022-05-24: qty 100

## 2022-05-24 MED ORDER — MIDAZOLAM HCL 2 MG/2ML IJ SOLN
INTRAMUSCULAR | Status: AC
  Filled 2022-05-24: qty 2

## 2022-05-24 MED ORDER — DULOXETINE HCL 60 MG PO CPEP: 60.0000 mg | ORAL_CAPSULE | Freq: Every day | ORAL | Status: DC

## 2022-05-24 MED ORDER — DEXAMETHASONE SODIUM PHOSPHATE 10 MG/ML IJ SOLN
INTRAMUSCULAR | Status: DC | PRN
  Administered 2022-05-24: 10 mg via INTRAVENOUS

## 2022-05-24 MED ORDER — FENTANYL CITRATE (PF) 100 MCG/2ML IJ SOLN
INTRAMUSCULAR | Status: DC | PRN
  Administered 2022-05-24: 25 ug via INTRAVENOUS
  Administered 2022-05-24: 50 ug via INTRAVENOUS
  Administered 2022-05-24: 25 ug via INTRAVENOUS
  Administered 2022-05-24: 50 ug via INTRAVENOUS

## 2022-05-24 MED ORDER — LACTATED RINGERS IV BOLUS
1000.0000 mL | Freq: Once | INTRAVENOUS | Status: AC
  Administered 2022-05-24: 1000 mL via INTRAVENOUS

## 2022-05-24 MED ORDER — SODIUM CHLORIDE 0.9 % IV SOLN: INTRAVENOUS | Status: DC

## 2022-05-24 MED ORDER — ACETAMINOPHEN 500 MG PO TABS
1000.0000 mg | ORAL_TABLET | Freq: Once | ORAL | Status: AC
  Administered 2022-05-24: 1000 mg via ORAL
  Filled 2022-05-24: qty 2

## 2022-05-24 MED ORDER — LACTATED RINGERS IV SOLN: INTRAVENOUS | Status: DC | PRN

## 2022-05-24 MED ORDER — VASOPRESSIN 20 UNITS/100 ML INFUSION FOR SHOCK
0.0000 [IU]/min | INTRAVENOUS | Status: DC
  Administered 2022-05-24 – 2022-05-25 (×2): 0.03 [IU]/min via INTRAVENOUS
  Filled 2022-05-24 (×2): qty 100

## 2022-05-24 MED ORDER — LACTATED RINGERS IV SOLN: INTRAVENOUS | Status: DC

## 2022-05-24 MED ORDER — CEFAZOLIN SODIUM-DEXTROSE 2-4 GM/100ML-% IV SOLN
2.0000 g | Freq: Three times a day (TID) | INTRAVENOUS | Status: AC
  Administered 2022-05-24 – 2022-05-25 (×2): 2 g via INTRAVENOUS
  Filled 2022-05-24 (×2): qty 100

## 2022-05-24 MED ORDER — ETOMIDATE 2 MG/ML IV SOLN
INTRAVENOUS | Status: AC
  Filled 2022-05-24: qty 20

## 2022-05-24 MED ORDER — THROMBIN 20000 UNITS EX SOLR
CUTANEOUS | Status: AC
  Filled 2022-05-24: qty 20000

## 2022-05-24 MED ORDER — TOPIRAMATE 25 MG PO TABS: 50.0000 mg | ORAL_TABLET | Freq: Every day | ORAL | Status: DC

## 2022-05-24 MED ORDER — OXYCODONE HCL 5 MG/5ML PO SOLN: 5.0000 mg | Freq: Once | ORAL | Status: DC | PRN

## 2022-05-24 MED ORDER — 0.9 % SODIUM CHLORIDE (POUR BTL) OPTIME
TOPICAL | Status: DC | PRN
  Administered 2022-05-24: 3000 mL

## 2022-05-24 MED ORDER — SODIUM CHLORIDE 0.9 % IV SOLN
250.0000 mL | INTRAVENOUS | Status: DC
  Administered 2022-05-24: 250 mL via INTRAVENOUS

## 2022-05-24 MED ORDER — VANCOMYCIN HCL 1000 MG IV SOLR
INTRAVENOUS | Status: AC
  Filled 2022-05-24: qty 20

## 2022-05-24 MED ORDER — POLYETHYLENE GLYCOL 3350 17 G PO PACK
17.0000 g | PACK | Freq: Two times a day (BID) | ORAL | Status: DC
  Administered 2022-05-24 – 2022-05-30 (×6): 17 g
  Filled 2022-05-24 (×7): qty 1

## 2022-05-24 MED ORDER — PHENYLEPHRINE 80 MCG/ML (10ML) SYRINGE FOR IV PUSH (FOR BLOOD PRESSURE SUPPORT)
80.0000 ug | PREFILLED_SYRINGE | Freq: Once | INTRAVENOUS | Status: AC | PRN
  Administered 2022-05-24: 80 ug via INTRAVENOUS

## 2022-05-24 MED ORDER — MELATONIN 5 MG PO TABS: 10.0000 mg | ORAL_TABLET | Freq: Every day | ORAL | Status: DC

## 2022-05-24 MED ORDER — IPRATROPIUM-ALBUTEROL 0.5-2.5 (3) MG/3ML IN SOLN
3.0000 mL | RESPIRATORY_TRACT | Status: DC | PRN
  Administered 2022-06-02: 3 mL via RESPIRATORY_TRACT
  Filled 2022-05-24: qty 3

## 2022-05-24 MED ORDER — VANCOMYCIN HCL 500 MG IV SOLR
INTRAVENOUS | Status: DC | PRN
  Administered 2022-05-24: 1000 mg via TOPICAL

## 2022-05-24 MED ORDER — LORAZEPAM 2 MG/ML IJ SOLN
INTRAMUSCULAR | Status: AC
  Filled 2022-05-24: qty 1

## 2022-05-24 MED ORDER — FENTANYL CITRATE (PF) 250 MCG/5ML IJ SOLN
INTRAMUSCULAR | Status: AC
  Filled 2022-05-24: qty 5

## 2022-05-24 MED ORDER — ACETAMINOPHEN 650 MG RE SUPP
650.0000 mg | RECTAL | Status: DC | PRN
  Administered 2022-05-26: 650 mg via RECTAL
  Filled 2022-05-24: qty 1

## 2022-05-24 MED ORDER — ADULT MULTIVITAMIN W/MINERALS CH
1.0000 | ORAL_TABLET | Freq: Every day | ORAL | Status: DC
  Administered 2022-05-24 – 2022-06-06 (×14): 1
  Filled 2022-05-24 (×14): qty 1

## 2022-05-24 MED ORDER — POLYETHYL GLYCOL-PROPYL GLYCOL 0.4-0.3 % OP SOLN: 1.0000 [drp] | Freq: Three times a day (TID) | OPHTHALMIC | Status: DC

## 2022-05-24 MED ORDER — LEVETIRACETAM IN NACL 1000 MG/100ML IV SOLN
1000.0000 mg | Freq: Two times a day (BID) | INTRAVENOUS | Status: DC
  Administered 2022-05-25 – 2022-05-27 (×5): 1000 mg via INTRAVENOUS
  Filled 2022-05-24 (×5): qty 100

## 2022-05-24 MED ORDER — NOREPINEPHRINE 4 MG/250ML-% IV SOLN
INTRAVENOUS | Status: AC
  Filled 2022-05-24: qty 250

## 2022-05-24 MED ORDER — SENNOSIDES 8.8 MG/5ML PO SYRP: 8.8000 mg | ORAL_SOLUTION | Freq: Two times a day (BID) | ORAL | Status: DC | PRN

## 2022-05-24 MED ORDER — ORAL CARE MOUTH RINSE
15.0000 mL | OROMUCOSAL | Status: DC
  Administered 2022-05-24 – 2022-06-06 (×154): 15 mL via OROMUCOSAL

## 2022-05-24 MED ORDER — LEVETIRACETAM IN NACL 500 MG/100ML IV SOLN: 500.0000 mg | Freq: Two times a day (BID) | INTRAVENOUS | Status: DC

## 2022-05-24 MED ORDER — TAMSULOSIN HCL 0.4 MG PO CAPS: 0.4000 mg | ORAL_CAPSULE | Freq: Every day | ORAL | Status: DC

## 2022-05-24 MED ORDER — TRAZODONE HCL 50 MG PO TABS: 150.0000 mg | ORAL_TABLET | Freq: Every day | ORAL | Status: DC

## 2022-05-24 MED ORDER — ORAL CARE MOUTH RINSE: 15.0000 mL | Freq: Once | OROMUCOSAL | Status: AC

## 2022-05-24 MED ORDER — ONDANSETRON HCL 4 MG/2ML IJ SOLN: 4.0000 mg | Freq: Once | INTRAMUSCULAR | Status: DC | PRN

## 2022-05-24 MED ORDER — PROPOFOL 10 MG/ML IV BOLUS
INTRAVENOUS | Status: DC | PRN
  Administered 2022-05-24: 70 mg via INTRAVENOUS
  Administered 2022-05-24: 20 mg via INTRAVENOUS

## 2022-05-24 MED ORDER — ATORVASTATIN CALCIUM 80 MG PO TABS
80.0000 mg | ORAL_TABLET | Freq: Every day | ORAL | Status: DC
  Administered 2022-05-24 – 2022-06-06 (×14): 80 mg
  Filled 2022-05-24 (×14): qty 1

## 2022-05-24 MED ORDER — HYDROMORPHONE HCL 1 MG/ML IJ SOLN: 0.2500 mg | INTRAMUSCULAR | Status: DC | PRN

## 2022-05-24 MED ORDER — ONDANSETRON HCL 4 MG PO TABS: 4.0000 mg | ORAL_TABLET | ORAL | Status: DC | PRN

## 2022-05-24 MED ORDER — ROCURONIUM BROMIDE 10 MG/ML (PF) SYRINGE
PREFILLED_SYRINGE | INTRAVENOUS | Status: AC
  Filled 2022-05-24: qty 10

## 2022-05-24 MED ORDER — HYDROCODONE-ACETAMINOPHEN 5-325 MG PO TABS
1.0000 | ORAL_TABLET | ORAL | Status: DC | PRN
  Administered 2022-05-25 – 2022-06-02 (×2): 1 via ORAL
  Filled 2022-05-24 (×3): qty 1

## 2022-05-24 MED ORDER — PANTOPRAZOLE SODIUM 40 MG IV SOLR
40.0000 mg | Freq: Every day | INTRAVENOUS | Status: DC
  Administered 2022-05-24 – 2022-06-06 (×14): 40 mg via INTRAVENOUS
  Filled 2022-05-24 (×14): qty 10

## 2022-05-24 MED ORDER — ETOMIDATE 2 MG/ML IV SOLN
20.0000 mg | Freq: Once | INTRAVENOUS | Status: AC
  Administered 2022-05-24: 20 mg via INTRAVENOUS

## 2022-05-24 MED ORDER — ROCURONIUM BROMIDE 50 MG/5ML IV SOLN
100.0000 mg | Freq: Once | INTRAVENOUS | Status: AC
  Administered 2022-05-24: 100 mg via INTRAVENOUS

## 2022-05-24 MED ORDER — THROMBIN 20000 UNITS EX SOLR
CUTANEOUS | Status: DC | PRN
  Administered 2022-05-24: 20 mL via TOPICAL

## 2022-05-24 MED ORDER — ACETAMINOPHEN 325 MG PO TABS
650.0000 mg | ORAL_TABLET | ORAL | Status: DC | PRN
  Administered 2022-05-26 (×3): 650 mg via ORAL
  Filled 2022-05-24 (×5): qty 2

## 2022-05-24 MED ORDER — ROCURONIUM BROMIDE 100 MG/10ML IV SOLN
INTRAVENOUS | Status: DC | PRN
  Administered 2022-05-24: 20 mg via INTRAVENOUS
  Administered 2022-05-24: 50 mg via INTRAVENOUS

## 2022-05-24 MED ORDER — SUGAMMADEX SODIUM 200 MG/2ML IV SOLN
INTRAVENOUS | Status: DC | PRN
  Administered 2022-05-24: 175 mg via INTRAVENOUS

## 2022-05-24 MED ORDER — TOPIRAMATE 25 MG PO TABS
75.0000 mg | ORAL_TABLET | Freq: Every day | ORAL | Status: DC
  Administered 2022-05-24 – 2022-05-27 (×4): 75 mg
  Filled 2022-05-24 (×4): qty 3

## 2022-05-24 MED ORDER — AMISULPRIDE (ANTIEMETIC) 5 MG/2ML IV SOLN: 10.0000 mg | Freq: Once | INTRAVENOUS | Status: DC | PRN

## 2022-05-24 MED ORDER — PHENYLEPHRINE HCL-NACL 20-0.9 MG/250ML-% IV SOLN
INTRAVENOUS | Status: DC | PRN
  Administered 2022-05-24: 20 ug/min via INTRAVENOUS

## 2022-05-24 MED ORDER — SODIUM CHLORIDE 0.9 % IV SOLN
60.0000 mg/kg | Freq: Once | INTRAVENOUS | Status: DC
  Filled 2022-05-24: qty 49.8

## 2022-05-24 MED ORDER — HEMOSTATIC AGENTS (NO CHARGE) OPTIME
TOPICAL | Status: DC | PRN
  Administered 2022-05-24: 1 via TOPICAL

## 2022-05-24 MED ORDER — METOPROLOL TARTRATE 50 MG PO TABS: 50.0000 mg | ORAL_TABLET | Freq: Two times a day (BID) | ORAL | Status: DC

## 2022-05-24 MED ORDER — PHENYLEPHRINE 80 MCG/ML (10ML) SYRINGE FOR IV PUSH (FOR BLOOD PRESSURE SUPPORT)
PREFILLED_SYRINGE | INTRAVENOUS | Status: DC | PRN
  Administered 2022-05-24: 160 ug via INTRAVENOUS
  Administered 2022-05-24 (×2): 80 ug via INTRAVENOUS
  Administered 2022-05-24: 240 ug via INTRAVENOUS

## 2022-05-24 MED ORDER — PHENYLEPHRINE HCL-NACL 20-0.9 MG/250ML-% IV SOLN: 25.0000 ug/min | INTRAVENOUS | Status: DC

## 2022-05-24 MED ORDER — THROMBIN 5000 UNITS EX SOLR
OROMUCOSAL | Status: DC | PRN
  Administered 2022-05-24 (×2): 5 mL via TOPICAL

## 2022-05-24 MED ORDER — VALPROIC ACID 250 MG/5ML PO SOLN: 1000.0000 mg | Freq: Two times a day (BID) | ORAL | Status: DC

## 2022-05-24 MED ORDER — THROMBIN 5000 UNITS EX SOLR
CUTANEOUS | Status: AC
  Filled 2022-05-24: qty 5000

## 2022-05-24 MED ORDER — PROPOFOL 1000 MG/100ML IV EMUL
5.0000 ug/kg/min | INTRAVENOUS | Status: DC
  Administered 2022-05-24: 5 ug/kg/min via INTRAVENOUS
  Administered 2022-05-24 – 2022-05-25 (×5): 60 ug/kg/min via INTRAVENOUS
  Filled 2022-05-24 (×6): qty 100

## 2022-05-24 MED ORDER — RINGERS IV SOLN: INTRAVENOUS | Status: DC

## 2022-05-24 MED ORDER — LACOSAMIDE 50 MG PO TABS
200.0000 mg | ORAL_TABLET | Freq: Two times a day (BID) | ORAL | Status: DC
  Administered 2022-05-24 – 2022-06-06 (×28): 200 mg
  Filled 2022-05-24 (×28): qty 4

## 2022-05-24 MED ORDER — LIDOCAINE-EPINEPHRINE 1 %-1:100000 IJ SOLN
INTRAMUSCULAR | Status: AC
  Filled 2022-05-24: qty 1

## 2022-05-24 MED ORDER — PROMETHAZINE HCL 25 MG PO TABS: 12.5000 mg | ORAL_TABLET | ORAL | Status: DC | PRN

## 2022-05-24 MED ORDER — OXYCODONE HCL 5 MG PO TABS: 5.0000 mg | ORAL_TABLET | Freq: Once | ORAL | Status: DC | PRN

## 2022-05-24 MED ORDER — DULOXETINE HCL 30 MG PO CPEP: 30.0000 mg | ORAL_CAPSULE | Freq: Every day | ORAL | Status: DC

## 2022-05-24 MED ORDER — VALPROIC ACID 250 MG/5ML PO SOLN
1000.0000 mg | Freq: Two times a day (BID) | ORAL | Status: DC
  Administered 2022-05-24 – 2022-06-07 (×28): 1000 mg
  Filled 2022-05-24 (×31): qty 20

## 2022-05-24 MED ORDER — SODIUM CHLORIDE 0.9 % IV SOLN
4500.0000 mg | Freq: Once | INTRAVENOUS | Status: AC
  Administered 2022-05-24: 4500 mg via INTRAVENOUS
  Filled 2022-05-24: qty 45

## 2022-05-24 MED ORDER — ONDANSETRON HCL 4 MG/2ML IJ SOLN
INTRAMUSCULAR | Status: DC | PRN
  Administered 2022-05-24: 4 mg via INTRAVENOUS

## 2022-05-24 MED ORDER — TOPIRAMATE 25 MG PO TABS: 25.0000 mg | ORAL_TABLET | Freq: Every day | ORAL | Status: DC

## 2022-05-24 MED ORDER — ALBUMIN HUMAN 5 % IV SOLN: INTRAVENOUS | Status: DC | PRN

## 2022-05-24 MED ORDER — SERTRALINE HCL 50 MG PO TABS
100.0000 mg | ORAL_TABLET | Freq: Every day | ORAL | Status: DC
  Administered 2022-05-25 – 2022-06-06 (×13): 100 mg
  Filled 2022-05-24 (×14): qty 2

## 2022-05-24 MED ORDER — CHLORHEXIDINE GLUCONATE CLOTH 2 % EX PADS
6.0000 | MEDICATED_PAD | Freq: Once | CUTANEOUS | Status: DC
  Administered 2022-05-24: 6 via TOPICAL

## 2022-05-24 MED ORDER — FENTANYL CITRATE PF 50 MCG/ML IJ SOSY
PREFILLED_SYRINGE | INTRAMUSCULAR | Status: AC
  Filled 2022-05-24: qty 2

## 2022-05-24 MED ORDER — NOREPINEPHRINE 4 MG/250ML-% IV SOLN
0.0000 ug/min | INTRAVENOUS | Status: DC
  Administered 2022-05-24: 10 ug/min via INTRAVENOUS
  Administered 2022-05-24: 22 ug/min via INTRAVENOUS
  Administered 2022-05-25: 7 ug/min via INTRAVENOUS
  Administered 2022-05-25: 12 ug/min via INTRAVENOUS
  Filled 2022-05-24 (×3): qty 250
  Filled 2022-05-24: qty 500

## 2022-05-24 MED ORDER — CHLORHEXIDINE GLUCONATE CLOTH 2 % EX PADS
6.0000 | MEDICATED_PAD | Freq: Every day | CUTANEOUS | Status: DC
  Administered 2022-05-25 – 2022-06-02 (×9): 6 via TOPICAL

## 2022-05-24 MED ORDER — SODIUM CHLORIDE 0.9 % IV BOLUS
1000.0000 mL | Freq: Once | INTRAVENOUS | Status: AC
  Administered 2022-05-24: 1000 mL via INTRAVENOUS

## 2022-05-24 MED ORDER — ORAL CARE MOUTH RINSE: 15.0000 mL | OROMUCOSAL | Status: DC | PRN

## 2022-05-24 MED ORDER — ONDANSETRON HCL 4 MG/2ML IJ SOLN: 4.0000 mg | INTRAMUSCULAR | Status: DC | PRN

## 2022-05-24 MED ORDER — CEFAZOLIN SODIUM-DEXTROSE 2-4 GM/100ML-% IV SOLN
2.0000 g | INTRAVENOUS | Status: AC
  Administered 2022-05-24: 2 g via INTRAVENOUS
  Filled 2022-05-24: qty 100

## 2022-05-24 MED ORDER — LORAZEPAM 2 MG/ML IJ SOLN
1.0000 mg | INTRAMUSCULAR | Status: DC | PRN
  Administered 2022-05-24: 2 mg via INTRAVENOUS
  Filled 2022-05-24 (×2): qty 1

## 2022-05-24 MED ORDER — LORAZEPAM 2 MG/ML IJ SOLN
2.0000 mg | Freq: Once | INTRAMUSCULAR | Status: AC
  Administered 2022-05-24: 2 mg via INTRAVENOUS
  Filled 2022-05-24: qty 1

## 2022-05-24 MED ORDER — EPHEDRINE SULFATE-NACL 50-0.9 MG/10ML-% IV SOSY
PREFILLED_SYRINGE | INTRAVENOUS | Status: DC | PRN
  Administered 2022-05-24: 5 mg via INTRAVENOUS
  Administered 2022-05-24: 10 mg via INTRAVENOUS
  Administered 2022-05-24: 5 mg via INTRAVENOUS
  Administered 2022-05-24: 10 mg via INTRAVENOUS

## 2022-05-24 MED ORDER — BISACODYL 10 MG RE SUPP: 10.0000 mg | Freq: Every day | RECTAL | Status: DC | PRN

## 2022-05-24 MED ORDER — BACLOFEN 5 MG HALF TABLET
5.0000 mg | ORAL_TABLET | Freq: Three times a day (TID) | ORAL | Status: DC
  Administered 2022-05-24: 5 mg
  Filled 2022-05-24 (×2): qty 1

## 2022-05-24 MED ORDER — LIDOCAINE 2% (20 MG/ML) 5 ML SYRINGE
INTRAMUSCULAR | Status: DC | PRN
  Administered 2022-05-24: 20 mg via INTRAVENOUS

## 2022-05-24 MED ORDER — DIVALPROEX SODIUM 500 MG PO DR TAB: 1000.0000 mg | DELAYED_RELEASE_TABLET | Freq: Two times a day (BID) | ORAL | Status: DC

## 2022-05-24 MED ORDER — CHLORHEXIDINE GLUCONATE CLOTH 2 % EX PADS: 6.0000 | MEDICATED_PAD | Freq: Once | CUTANEOUS | Status: DC

## 2022-05-24 MED ORDER — FENTANYL 2500MCG IN NS 250ML (10MCG/ML) PREMIX INFUSION
50.0000 ug/h | INTRAVENOUS | Status: DC
  Administered 2022-05-24 – 2022-05-25 (×2): 100 ug/h via INTRAVENOUS
  Administered 2022-05-26: 75 ug/h via INTRAVENOUS
  Filled 2022-05-24 (×3): qty 250

## 2022-05-24 MED ORDER — HYDROMORPHONE HCL 1 MG/ML IJ SOLN: 0.5000 mg | INTRAMUSCULAR | Status: DC | PRN

## 2022-05-24 MED ORDER — LABETALOL HCL 5 MG/ML IV SOLN: 10.0000 mg | INTRAVENOUS | Status: DC | PRN

## 2022-05-24 MED ORDER — PHENYLEPHRINE HCL-NACL 20-0.9 MG/250ML-% IV SOLN: 0.0000 ug/min | INTRAVENOUS | Status: DC

## 2022-05-24 MED ORDER — MENTHOL 3 MG MT LOZG: 1.0000 | LOZENGE | OROMUCOSAL | Status: DC | PRN

## 2022-05-24 MED ORDER — CHLORHEXIDINE GLUCONATE 0.12 % MT SOLN
15.0000 mL | Freq: Once | OROMUCOSAL | Status: AC
  Administered 2022-05-24: 15 mL via OROMUCOSAL
  Filled 2022-05-24: qty 15

## 2022-05-24 SURGICAL SUPPLY — 62 items
ADH SKN CLS APL DERMABOND .7 (GAUZE/BANDAGES/DRESSINGS)
BAG COUNTER SPONGE SURGICOUNT (BAG) ×1 IMPLANT
BAG DECANTER FOR FLEXI CONT (MISCELLANEOUS) ×1 IMPLANT
BAG SPNG CNTER NS LX DISP (BAG) ×1
BAND INSRT 18 STRL LF DISP RB (MISCELLANEOUS) ×2
BAND RUBBER #18 3X1/16 STRL (MISCELLANEOUS) ×2 IMPLANT
BNDG COHESIVE 4X5 TAN STRL (GAUZE/BANDAGES/DRESSINGS) IMPLANT
BUR ACORN 6.0 PRECISION (BURR) IMPLANT
CABLE BIPOLOR RESECTION CORD (MISCELLANEOUS) ×1 IMPLANT
CANISTER SUCT 3000ML PPV (MISCELLANEOUS) ×1 IMPLANT
CLIP RANEY DISP (INSTRUMENTS) ×3 IMPLANT
COVER BACK TABLE 60X90IN (DRAPES) IMPLANT
DERMABOND ADVANCED .7 DNX12 (GAUZE/BANDAGES/DRESSINGS) IMPLANT
DRAIN CHANNEL 10M FLAT 3/4 FLT (DRAIN) IMPLANT
DRAPE NEUROLOGICAL W/INCISE (DRAPES) ×1 IMPLANT
DRAPE SURG 17X23 STRL (DRAPES) IMPLANT
DRAPE WARM FLUID 44X44 (DRAPES) ×1 IMPLANT
ELECT CAUTERY BLADE 6.4 (BLADE) ×1 IMPLANT
ELECT REM PT RETURN 9FT ADLT (ELECTROSURGICAL) ×1
ELECTRODE REM PT RTRN 9FT ADLT (ELECTROSURGICAL) ×1 IMPLANT
EVACUATOR SILICONE 100CC (DRAIN) IMPLANT
GAUZE 4X4 16PLY ~~LOC~~+RFID DBL (SPONGE) ×1 IMPLANT
GAUZE SPONGE 4X4 12PLY STRL (GAUZE/BANDAGES/DRESSINGS) ×1 IMPLANT
GLOVE ECLIPSE 9.0 STRL (GLOVE) ×1 IMPLANT
GLOVE EXAM NITRILE XL STR (GLOVE) IMPLANT
GOWN STRL REUS W/ TWL LRG LVL3 (GOWN DISPOSABLE) IMPLANT
GOWN STRL REUS W/ TWL XL LVL3 (GOWN DISPOSABLE) IMPLANT
GOWN STRL REUS W/TWL 2XL LVL3 (GOWN DISPOSABLE) IMPLANT
GOWN STRL REUS W/TWL LRG LVL3 (GOWN DISPOSABLE)
GOWN STRL REUS W/TWL XL LVL3 (GOWN DISPOSABLE)
HEMOSTAT POWDER KIT SURGIFOAM (HEMOSTASIS) IMPLANT
HEMOSTAT SURGICEL 2X14 (HEMOSTASIS) ×1 IMPLANT
KIT BASIN OR (CUSTOM PROCEDURE TRAY) ×1 IMPLANT
KIT TURNOVER KIT B (KITS) ×1 IMPLANT
MESH DYNAMIC MALL LG 1.7X120 (Mesh General) IMPLANT
NDL HYPO 25X1 1.5 SAFETY (NEEDLE) ×1 IMPLANT
NEEDLE HYPO 25X1 1.5 SAFETY (NEEDLE) ×1 IMPLANT
NS IRRIG 1000ML POUR BTL (IV SOLUTION) ×1 IMPLANT
PACK BATTERY CMF DISP FOR DVR (ORTHOPEDIC DISPOSABLE SUPPLIES) IMPLANT
PACK CRANIOTOMY CUSTOM (CUSTOM PROCEDURE TRAY) IMPLANT
PACK LAMINECTOMY NEURO (CUSTOM PROCEDURE TRAY) ×1 IMPLANT
PAD ARMBOARD 7.5X6 YLW CONV (MISCELLANEOUS) ×3 IMPLANT
PIN MAYFIELD SKULL DISP (PIN) IMPLANT
PLATE CRANIAL CMF SM 6H (Plate) IMPLANT
SCREW UNIII AXS SD 1.5X4 (Screw) IMPLANT
SET CRAINOPLASTY (SET/KITS/TRAYS/PACK) ×1 IMPLANT
SOL ELECTROSURG ANTI STICK (MISCELLANEOUS) ×1
SOLUTION ELECTROSURG ANTI STCK (MISCELLANEOUS) ×1 IMPLANT
SPONGE SURGIFOAM ABS GEL 100 (HEMOSTASIS) ×1 IMPLANT
SPONGE T-LAP 4X18 ~~LOC~~+RFID (SPONGE) IMPLANT
STAPLER VISISTAT 35W (STAPLE) ×1 IMPLANT
STOCKINETTE 6  STRL (DRAPES) ×1
STOCKINETTE 6 STRL (DRAPES) IMPLANT
SUT NURALON 4 0 TR CR/8 (SUTURE) IMPLANT
SUT VIC AB 2-0 CT1 18 (SUTURE) IMPLANT
SUT VIC AB 2-0 CT2 18 VCP726D (SUTURE) ×1 IMPLANT
SYR CONTROL 10ML LL (SYRINGE) ×1 IMPLANT
TOWEL GREEN STERILE (TOWEL DISPOSABLE) ×1 IMPLANT
TOWEL GREEN STERILE FF (TOWEL DISPOSABLE) ×1 IMPLANT
TRAY FOLEY MTR SLVR 16FR STAT (SET/KITS/TRAYS/PACK) IMPLANT
UNDERPAD 30X36 HEAVY ABSORB (UNDERPADS AND DIAPERS) IMPLANT
WATER STERILE IRR 1000ML POUR (IV SOLUTION) ×1 IMPLANT

## 2022-05-24 NOTE — Op Note (Signed)
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  Asst. Surgeon: None  Anesthesia: General  Indication: 51 year old male remotely status post right-sided decompressive craniectomy by another physician with subsequent cranioplasty also by another physician.  Patient presents with an obvious cranial defect with increasing headaches.  The patient's cranioplasty flap has dislodged from the anchoring screws and has sunken tremendously into his right hemisphere.  Plan is for reexploration and revision.  Operative note: After induction of anesthesia, patient positioned supine with his head turned toward the left.  Patient's right scalp was prepped and draped sterilely.  His frontotemporoparietal incision was reopened.  The scalp was mobilized carefully dissecting the underlying cranial flap free.  The cranial flap was then mobilized and dissected from the underlying pseudo dura which had formed beneath the bone flap.  There is a small violation into the subdural space which was collecting some blood but there was no active bleeding.  I placed a subdural drain which will be removed after surgery.  The bone flap had significantly regressed in size lead using large gaps.  I reapproximated the bone frontally and secured this in place with multiple cranial plates and strips for cranioplasty mesh.  This was anchored in the skull with multiple 4 mm screws.  After the bone was adequately anchored I placed a epidural drain in the potential space between the pseudo dura and the bone flap.  This as well as the subdural drain was exited through the the scalp.  A large piece of mesh was then placed over the  temporal and parietal skull defect.  This was anchored to the calvarium and bone flap with good coverage of his cranial defect.  The wound was then copiously irrigated with antibiotic irrigation.  The galea was reapproximated with 2-0 Vicryl sutures.  And the skin was reapproximated with staples.  There were no apparent complications.  The patient tolerated the procedure well and returned to the recovery room postop.

## 2022-05-24 NOTE — Progress Notes (Signed)
Patient had a seizure at this time that lasted approximately 1 minute. MD Pool notified of seizure. New orders for STAT head CT.

## 2022-05-24 NOTE — Progress Notes (Signed)
During admission patient became unresponsive with a L sided gaze progressing to convulsions. CCM to bedside, ativan given, pt intubated, kappra given and prop started.

## 2022-05-24 NOTE — Anesthesia Procedure Notes (Signed)
Procedure Name: Intubation Date/Time: 05/24/2022 10:35 AM  Performed by: Marny Lowenstein, CRNAPre-anesthesia Checklist: Patient identified, Emergency Drugs available, Suction available and Patient being monitored Patient Re-evaluated:Patient Re-evaluated prior to induction Oxygen Delivery Method: Circle system utilized Preoxygenation: Pre-oxygenation with 100% oxygen Induction Type: IV induction Ventilation: Mask ventilation without difficulty Laryngoscope Size: Glidescope and 4 Grade View: Grade I Tube type: Oral Tube size: 8.0 mm Number of attempts: 1 Airway Equipment and Method: Rigid stylet and Patient positioned with wedge pillow Placement Confirmation: ETT inserted through vocal cords under direct vision, positive ETCO2 and breath sounds checked- equal and bilateral Secured at: 24 cm Tube secured with: Tape Dental Injury: Teeth and Oropharynx as per pre-operative assessment

## 2022-05-24 NOTE — Progress Notes (Signed)
eLink Physician-Brief Progress Note Patient Name: Patrick Franklin DOB: 06-08-1970 MRN: [id number]   Date of Service  05/24/2022  HPI/Events of Note  Patient is s/p cranioplasty with tachycardia and hypotension of as yet unclear etiology, he has had some bleeding into JP drains but it is not clear if it is hemodynamically significant bleeding, he is getting a fluid bolus currently, post-op pain may also be an issue with the tachycardia. Patient is currently getting high dose Levophed via an IO.  eICU Interventions  Stat CBC, arterial ? Central line for BP monitoring and vasopressor delivery access, will add vasopressin and Phenylephrine to the MAR, Fentanyl gtt for post-op analgesia and Propofol sparing, given his hypotension. Keppra load for seizures.        Migdalia Dk 05/24/2022, 8:36 PM

## 2022-05-24 NOTE — H&P (Signed)
Patrick Franklin is an 52 y.o. male.   Chief Complaint: Headache HPI: 52 year old male status post right middle cerebral artery infarction requiring emergent decompressive craniectomy by an outside Careers adviser.  Patient is subsequent underwent cranioplasty.  He is also status post left-sided VP shunt.  The patient has noted progressive headaches worsening over the past 6 months.  The patient notes that his skull flap has sunken over time.  Workup demonstrates evidence of failure of his cranioplasty with subsidence of his cranial flap.  Patient presents now for cranioplasty revision.  Past Medical History:  Diagnosis Date   Aorto-iliac atherosclerosis 08/29/2017   By CT   Asthma    Cardiomegaly 04/13/2016   Cerebral embolism with cerebral infarction 10/03/2018   DVT (deep venous thrombosis) (HCC)    Dysphagia following other cerebrovascular disease    Elevated serum creatinine    Essential hypertension 05/15/2017   GERD without esophagitis    Headache    Hemiparesis affecting left side as late effect of stroke 08/09/2017   History of ischemic right MCA stroke 2017   History of left MCA stroke 2020   History of renal stone 08/22/2017   Stone analysis - calcium stone (08/2017) 8 non obstructing L kidney by CT - will refer to urology (08/2017)   Homonymous bilateral field defects    Hyperlipidemia 05/15/2017   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   MDD (major depressive disorder) 05/15/2017   Neurogenic bladder    Presence of cerebrospinal fluid drainage device    Pulmonary embolism 01/13/2016   Seizure disorder 08/09/2017   Spastic hemiplegia affecting nondominant side 03/26/2018   Subluxation of shoulder joint 04/13/2016   left   Thrombocytopenia 10/01/2018   UTI (urinary tract infection)     Past Surgical History:  Procedure Laterality Date   CRANIECTOMY  2017   CRANIOPLASTY  2018   IVC FILTER INSERTION  2017   PEG TUBE PLACEMENT  2017   VENTRICULOPERITONEAL SHUNT   2018    Family History  Problem Relation Age of Onset   Stroke Maternal Uncle    Stroke Maternal Uncle    Cancer Neg Hx    Social History:  reports that he quit smoking about 19 years ago. His smoking use included cigarettes. He has quit using smokeless tobacco.  His smokeless tobacco use included chew. He reports that he does not currently use alcohol. He reports that he does not use drugs.  Allergies:  Allergies  Allergen Reactions   Midazolam Other (See Comments)    Drops blood pressure and heart rate    Medications Prior to Admission  Medication Sig Dispense Refill   atorvastatin (LIPITOR) 80 MG tablet Take 80 mg by mouth at bedtime.     Baclofen 5 MG TABS Take 5 mg by mouth in the morning, at noon, and at bedtime.     bisacodyl (DULCOLAX) 10 MG suppository Place 10 mg rectally daily as needed for moderate constipation.     divalproex (DEPAKOTE) 500 MG DR tablet Take 2 tablets (1,000 mg total) by mouth every 12 (twelve) hours. 360 tablet 3   DULoxetine (CYMBALTA) 30 MG capsule Take 30 mg by mouth daily. Take 1 capsule every morning (Take with 60mg  capsule for total of 90mg )     DULoxetine (CYMBALTA) 60 MG capsule Take 60 mg by mouth daily. Take 1 capsule every morning (Take with 30mg  capsule for total of 90mg )     ipratropium-albuterol (DUONEB) 0.5-2.5 (3) MG/3ML SOLN Take 3 mLs by nebulization every 4 (  four) hours as needed (shortness of breath or wheezing).     lacosamide (VIMPAT) 200 MG TABS tablet Take 1 tablet (200 mg total) by mouth 2 (two) times daily. 180 tablet 3   Melatonin 10 MG TABS Take 10 mg by mouth at bedtime.     menthol-cetylpyridinium (CEPACOL) 3 MG lozenge Take 1 lozenge by mouth every 2 (two) hours as needed for sore throat.     metoprolol tartrate (LOPRESSOR) 50 MG tablet Take 50 mg by mouth 2 (two) times daily.     Multiple Vitamin (MULTIVITAMIN WITH MINERALS) TABS tablet Take 1 tablet by mouth daily.     Polyethyl Glycol-Propyl Glycol (SYSTANE) 0.4-0.3 %  SOLN Place 1 drop into both eyes in the morning, at noon, in the evening, and at bedtime.     polyethylene glycol (MIRALAX / GLYCOLAX) 17 g packet Take 17 g by mouth 2 (two) times daily.     Sennosides (SENNA) 8.6 MG CAPS Take 2 capsules by mouth every 12 (twelve) hours as needed (constipation).     sertraline (ZOLOFT) 100 MG tablet Take 100 mg by mouth daily.     tamsulosin (FLOMAX) 0.4 MG CAPS capsule Take 0.4 mg by mouth daily.     topiramate (TOPAMAX) 25 MG tablet Take 25 mg by mouth daily. Give 50mg  tablet with a 25mg  tablet for total dose 75mg      topiramate (TOPAMAX) 50 MG tablet Take 50 mg by mouth daily. Give 50mg  tablet with a 25mg  tablet for total dose 75mg      traZODone (DESYREL) 150 MG tablet Take 150 mg by mouth at bedtime.     aspirin EC 81 MG tablet Take 81 mg by mouth daily. (Patient not taking: Reported on 05/19/2022)      Results for orders placed or performed during the hospital encounter of 05/24/22 (from the past 48 hour(s))  ABO/Rh     Status: None (Preliminary result)   Collection Time: 05/24/22  9:17 AM  Result Value Ref Range   ABO/RH(D) PENDING    No results found.  Pertinent items noted in HPI and remainder of comprehensive ROS otherwise negative.  Blood pressure 132/71, pulse (!) 56, temperature 97.7 F (36.5 C), resp. rate 18, height 5\' 10"  (1.778 m), weight 83 kg, SpO2 97 %.  Patient is awake and alert.  He is oriented and reasonably appropriate.  His speech is halting but fairly fluent.  His content is good.  Judgment and insight appear intact.  Cranial nerve function with chronic left facial weakness and his tongue protrudes to the left.  Motor examination extremities reveals spastic hemiplegia of his left upper extremity with 3/5 strength which is spastic in his left lower extremity.  Right side is normal.  Examination of his head reveals well-healed surgical scar.  The patient's cranial defect is significant with a sunken bone flap.  Patient with significant  tenderness around the edges of the failed cranioplasty.  Examination of the oropharynx, nasopharynx and external auditory canals are clear.  Chest and abdomen are benign.  Extremities are free from injury deformity. Assessment/Plan Status post right cranioplasty failure.  Plan revision cranioplasty with revision plating.  Risks and benefits of been explained.  Patient wishes to proceed.  Kathaleen Maser Grayson White 05/24/2022, 9:38 AM

## 2022-05-24 NOTE — Procedures (Signed)
Arterial Catheter Insertion Procedure Note  SHAMARION COOTS  [id number]  08-15-1970  Date:05/24/22  Time:9:53 PM    Provider Performing: Adela Ports    Procedure: Insertion of Arterial Line (88416) without US guidance  Indication(s) Blood pressure monitoring and/or need for frequent ABGs  Consent Risks of the procedure as well as the alternatives and risks of each were explained to the patient and/or caregiver.  Consent for the procedure was obtained and is signed in the bedside chart  Anesthesia None   Time Out Verified patient identification, verified procedure, site/side was marked, verified correct patient position, special equipment/implants available, medications/allergies/relevant history reviewed, required imaging and test results available.   Sterile Technique Maximal sterile technique including full sterile barrier drape, hand hygiene, sterile gown, sterile gloves, mask, hair covering, sterile ultrasound probe cover (if used).   Procedure Description Area of catheter insertion was cleaned with chlorhexidine and draped in sterile fashion. Without real-time ultrasound guidance an arterial catheter was placed into the right radial artery.  Appropriate arterial tracings confirmed on monitor.     Complications/Tolerance None; patient tolerated the procedure well.   EBL Minimal   Specimen(s) None

## 2022-05-24 NOTE — Progress Notes (Signed)
MD Pool at bedside. Notified of patient's altered mental status and increased output from JP drains. Per MD, continue to monitor patient, no new orders at this time.

## 2022-05-24 NOTE — Progress Notes (Signed)
Pt given ativan for seizures. Noted head shaking and right arm shaking. Elink MD notified and ativan ordered. Propofol dose increased.

## 2022-05-24 NOTE — Progress Notes (Signed)
Postoperative patient emerged from anesthesia.  He had a strong left gaze preference.  He was moving his right side to commands.  Shortly afterward the patient began having generalized seizure activity.  This persisted despite Ativan and intraoperative Keppra.  The patient was intubated for airway control and resuscitation.  He has remained hypotensive on pressors.  He is oxygenating reasonably well.  His follow-up head CT scan demonstrates good appearance of his cranioplasty.  There is expected extensive pneumocephalus and some blood underneath the cranioplasty flap without any mass effect.  There is no evidence of left cerebral injury.  Patient with postoperative cerebral irritability secondary to pressure shifts and traction injury with resultant status epilepticus.  He is now intubated.  He is on propofol and receiving Keppra.  No obvious seizures ongoing.  He remains quite tachycardic with soft blood pressure.  I am worried the patient is significantly volume depleted.  I think it is possible that his intraoperative blood loss was underestimated, although certainly has some acidosis secondary to his seizure.  At this point I think he could use a fluid bolus to try to support his blood pressure and improve his tachycardia.  Continue ventilatory support and sedation for now.  I would hope that his brain irritability will diminish over the next 24 hours.  Hopefully at that point we can remove sedation and work on ventilatory weaning.  Plan to check an overnight head CT scan to rule out further hemorrhage or other new structural problem.

## 2022-05-24 NOTE — Brief Op Note (Signed)
05/24/2022  12:47 PM  PATIENT:  Rinaldo Ratel  52 y.o. male  PRE-OPERATIVE DIAGNOSIS:  Skull defect  POST-OPERATIVE DIAGNOSIS:  Skull defect  PROCEDURE:  Procedure(s): Right Revision Cranioplasty (Right)  SURGEON:  Surgeon(s) and Role:    Julio Sicks, MD - Primary  PHYSICIAN ASSISTANT:   ASSISTANTS:    ANESTHESIA:   general  EBL:  450 mL   BLOOD ADMINISTERED:none  DRAINS: (10mm) Blake drain(s) in the both epidural and subdural space    LOCAL MEDICATIONS USED:  MARCAINE     SPECIMEN:  No Specimen  DISPOSITION OF SPECIMEN:  N/A  COUNTS:  YES  TOURNIQUET:  * No tourniquets in log *  DICTATION: .Dragon Dictation  PLAN OF CARE: Admit to inpatient   PATIENT DISPOSITION:  PACU - hemodynamically stable.   Delay start of Pharmacological VTE agent (>24hrs) due to surgical blood loss or risk of bleeding: yes

## 2022-05-24 NOTE — Progress Notes (Signed)
eLink Physician-Brief Progress Note Patient Name: Patrick Franklin DOB: [birth year] MRN: [id number]   Date of Service  05/24/2022  HPI/Events of Note  Patient with persistent tachycardia despite weaning Levophed, Hemoglobin 13 gm / dl.  eICU Interventions  12 lead EKG to r/o atrial flutter.        Migdalia Dk 05/24/2022, 11:48 PM

## 2022-05-24 NOTE — Procedures (Signed)
Intubation Procedure Note  BOLDEN HAGERMAN  [id number]  09/24/1970  Date:05/24/22  Time:3:49 PM   Provider Performing:Ainslie Mazurek    Procedure: Intubation (31500)  Indication(s) Respiratory Failure  Consent Unable to obtain consent due to emergent nature of procedure.   Anesthesia Etomidate and Rocuronium   Time Out Verified patient identification, verified procedure, site/side was marked, verified correct patient position, special equipment/implants available, medications/allergies/relevant history reviewed, required imaging and test results available.   Sterile Technique Usual hand hygeine, masks, and gloves were used   Procedure Description Patient positioned in bed supine.  Sedation given as noted above.  Patient was intubated with endotracheal tube using  MAC  4 .  View was Grade 2 only posterior commissure .  Number of attempts was 1.  Colorimetric CO2 detector was consistent with tracheal placement.   Complications/Tolerance None; patient tolerated the procedure well. Chest X-ray is ordered to verify placement.   EBL Minimal   Specimen(s) None

## 2022-05-24 NOTE — Progress Notes (Signed)
eLink Physician-Brief Progress Note Patient Name: ARCHER MOIST DOB: 05-20-1970 MRN: [id number]   Date of Service  05/24/2022  HPI/Events of Note  Patient with seizures.  eICU Interventions  Propofol titrated up, PRN iv Ativan ordered. Ground crew requested to come and place a central line.        Migdalia Dk 05/24/2022, 9:54 PM

## 2022-05-24 NOTE — Transfer of Care (Signed)
Immediate Anesthesia Transfer of Care Note  Patient: Patrick Franklin  Procedure(s) Performed: Right Revision Cranioplasty (Right)  Patient Location: PACU  Anesthesia Type:General  Level of Consciousness: drowsy  Airway & Oxygen Therapy: Patient Spontanous Breathing and Patient connected to face mask oxygen  Post-op Assessment: Report given to RN and Post -op Vital signs reviewed and stable  Post vital signs: Reviewed and stable  Last Vitals:  Vitals Value Taken Time  BP 84/66 05/24/22 1332  Temp 36.1 C 05/24/22 1305  Pulse 91 05/24/22 1334  Resp 14 05/24/22 1334  SpO2 100 % 05/24/22 1334  Vitals shown include unvalidated device data.  Last Pain:  Vitals:   05/24/22 0919  PainSc: 0-No pain         Complications: No notable events documented.

## 2022-05-24 NOTE — Consult Note (Signed)
NAME:  Patrick Franklin, MRN:  [id number], DOB:  01-24-1971, LOS: 0 ADMISSION DATE:  05/24/2022, CONSULTATION DATE: 05/24/2022 REFERRING MD: Dr. Julio Sicks, CHIEF COMPLAINT: Altered mental status  History of Present Illness:  52 year old male with hypertension, hyperlipidemia and prior right MCA stroke, complicated with malignant cerebral edema requiring decompressive craniectomy, underwent cranioplasty and VP shunt, who presented with increasing headache, noted to have cranioplasty failure, today he underwent cranioplasty again, upon arrival to ICU patient was not responding, he had forced left gaze deviation, he became hypotensive, hypoxic and started gurgling.  PCCM was consulted for help evaluation medical management  Pertinent  Medical History   Past Medical History:  Diagnosis Date   Aorto-iliac atherosclerosis 08/29/2017   By CT   Asthma    Cardiomegaly 04/13/2016   Cerebral embolism with cerebral infarction 10/03/2018   DVT (deep venous thrombosis) (HCC)    Dysphagia following other cerebrovascular disease    Elevated serum creatinine    Essential hypertension 05/15/2017   GERD without esophagitis    Headache    Hemiparesis affecting left side as late effect of stroke 08/09/2017   History of ischemic right MCA stroke 2017   History of left MCA stroke 2020   History of renal stone 08/22/2017   Stone analysis - calcium stone (08/2017) 8 non obstructing L kidney by CT - will refer to urology (08/2017)   Homonymous bilateral field defects    Hyperlipidemia 05/15/2017   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   MDD (major depressive disorder) 05/15/2017   Neurogenic bladder    Presence of cerebrospinal fluid drainage device    Pulmonary embolism 01/13/2016   Seizure disorder 08/09/2017   Spastic hemiplegia affecting nondominant side 03/26/2018   Subluxation of shoulder joint 04/13/2016   left   Thrombocytopenia 10/01/2018   UTI (urinary tract infection)       Significant Hospital Events: Including procedures, antibiotic start and stop dates in addition to other pertinent events     Interim History / Subjective:  Consulted  Objective   Blood pressure 97/76, pulse 95, temperature (!) 97.5 F (36.4 C), resp. rate 19, height 5\' 10"  (1.778 m), weight 83 kg, SpO2 99 %.        Intake/Output Summary (Last 24 hours) at 05/24/2022 1550 Last data filed at 05/24/2022 1339 Gross per 24 hour  Intake 2050 ml  Output 1150 ml  Net 900 ml   Filed Weights   05/24/22 0854  Weight: 83 kg    Examination: Physical exam: General: Crtitically ill-appearing male, lying in the bed, unresponsive HEENT: Post  cranioplasty, eyes anicteric.  Post left gaze deviation Neuro: Eyes open, not following commands, pupils bilateral reactive to light, no gag or cough Chest: Coarse breath sounds, no wheezes or rhonchi Heart: Tachycardic, regular rhythm, no murmurs or gallops Abdomen: Soft, nondistended, bowel sounds present Skin: No rash  Labs and images were reviewed  Resolved Hospital Problem list     Assessment & Plan:  Status epilepticus Acute encephalopathy due to recurrent seizures Patient was noted to be actively seizing upon arrival to ICU He was given 2 mg of Ativan Received loading dose of Keppra Continue Keppra, Topamax and Depakote Continue seizure precautions  Acute hypoxic respiratory failure Possible aspiration pneumonitis During seizures patient was noted to be hypoxic, he started gurgling His x-ray chest showed no acute abnormalities He may have acute pneumonitis likely due to aspiration Continue lung protective ventilation VAP prevention bundle in place PAD protocol with propofol and as needed fentanyl,  RASS goal -2  Medication induced hypotension Patient became hypotensive after initiation of sedation Currently on Levophed, monitor with MAP goal 65  Prior right MCA stroke s/p craniectomy and now with  cranioplasty Hydrocephalus s/p VP shunt Postop management defer to neurosurgery  Best Practice (right click and "Reselect all SmartList Selections" daily)   Diet/type: NPO DVT prophylaxis: SCD GI prophylaxis: PPI Lines: NA Foley:  Yes, still needed Code Status:  full code Last date of multidisciplinary goals of care discussion [4/30: Patient's family was updated at bedside]  Labs   CBC: Recent Labs  Lab 05/23/22 0954  WBC 4.9  HGB 15.3  HCT 49.0  MCV 96.8  PLT 81*    Basic Metabolic Panel: Recent Labs  Lab 05/23/22 0954  NA 142  K 3.8  CL 109  CO2 26  GLUCOSE 99  BUN 14  CREATININE 0.89  CALCIUM 8.7*   GFR: Estimated Creatinine Clearance: 102.5 mL/min (by C-G formula based on SCr of 0.89 mg/dL). Recent Labs  Lab 05/23/22 0954  WBC 4.9    Liver Function Tests: No results for input(s): "AST", "ALT", "ALKPHOS", "BILITOT", "PROT", "ALBUMIN" in the last 168 hours. No results for input(s): "LIPASE", "AMYLASE" in the last 168 hours. No results for input(s): "AMMONIA" in the last 168 hours.  ABG    Component Value Date/Time   TCO2 28 10/01/2018 1850     Coagulation Profile: No results for input(s): "INR", "PROTIME" in the last 168 hours.  Cardiac Enzymes: No results for input(s): "CKTOTAL", "CKMB", "CKMBINDEX", "TROPONINI" in the last 168 hours.  HbA1C: Hgb A1c MFr Bld  Date/Time Value Ref Range Status  10/02/2018 03:33 AM 5.2 4.8 - 5.6 % Final    Comment:    (NOTE)         Prediabetes: 5.7 - 6.4         Diabetes: >6.4         Glycemic control for adults with diabetes: <7.0     CBG: Recent Labs  Lab 05/24/22 1316 05/24/22 1437  GLUCAP 126* 187*    Review of Systems:   Unable to obtain as patient is intubated and sedated  Past Medical History:  He,  has a past medical history of Aorto-iliac atherosclerosis (08/29/2017), Asthma, Cardiomegaly (04/13/2016), Cerebral embolism with cerebral infarction (10/03/2018), DVT (deep venous thrombosis)  (HCC), Dysphagia following other cerebrovascular disease, Elevated serum creatinine, Essential hypertension (05/15/2017), GERD without esophagitis, Headache, Hemiparesis affecting left side as late effect of stroke (08/09/2017), History of ischemic right MCA stroke (2017), History of left MCA stroke (2020), History of renal stone (08/22/2017), Homonymous bilateral field defects, Hyperlipidemia (05/15/2017), 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   Surgical History:   Past Surgical History:  Procedure Laterality Date   CRANIECTOMY  2017   CRANIOPLASTY  2018   IVC FILTER INSERTION  2017   PEG TUBE PLACEMENT  2017   VENTRICULOPERITONEAL SHUNT  2018     Social History:   reports that he quit smoking about 19 years ago. His smoking use included cigarettes. He has quit using smokeless tobacco.  His smokeless tobacco use included chew. He reports that he does not currently use alcohol. He reports that he does not use drugs.   Family History:  His family history includes Stroke in his maternal uncle  and maternal uncle. There is no history of Cancer.   Allergies Allergies  Allergen Reactions   Midazolam Other (See Comments)    Drops blood pressure and heart rate     Home Medications  Prior to Admission medications   Medication Sig Start Date End Date Taking? Authorizing Provider  atorvastatin (LIPITOR) 80 MG tablet Take 80 mg by mouth at bedtime.   Yes [provider]  Baclofen 5 MG TABS Take 5 mg by mouth in the morning, at noon, and at bedtime. 09/14/19  Yes [provider]  bisacodyl (DULCOLAX) 10 MG suppository Place  10 mg rectally daily as needed for moderate constipation.   Yes [provider]  divalproex (DEPAKOTE) 500 MG DR tablet Take 2 tablets (1,000 mg total) by mouth every 12 (twelve) hours. 02/11/20  Yes Van Clines, MD  DULoxetine (CYMBALTA) 30 MG capsule Take 30 mg by mouth daily. Take 1 capsule every morning (Take with 60mg  capsule for total of 90mg )   Yes [provider]  DULoxetine (CYMBALTA) 60 MG capsule Take 60 mg by mouth daily. Take 1 capsule every morning (Take with 30mg  capsule for total of 90mg )   Yes [provider]  ipratropium-albuterol (DUONEB) 0.5-2.5 (3) MG/3ML SOLN Take 3 mLs by nebulization every 4 (four) hours as needed (shortness of breath or wheezing).   Yes [provider]  lacosamide (VIMPAT) 200 MG TABS tablet Take 1 tablet (200 mg total) by mouth 2 (two) times daily. 08/14/20  Yes Van Clines, MD  Melatonin 10 MG TABS Take 10 mg by mouth at bedtime. 07/21/21  Yes [provider]  menthol-cetylpyridinium (CEPACOL) 3 MG lozenge Take 1 lozenge by mouth every 2 (two) hours as needed for sore throat.   Yes [provider]  metoprolol tartrate (LOPRESSOR) 50 MG tablet Take 50 mg by mouth 2 (two) times daily. 10/22/19  Yes [provider]  Multiple Vitamin (MULTIVITAMIN WITH MINERALS) TABS tablet Take 1 tablet by mouth daily.   Yes [provider]  Polyethyl Glycol-Propyl Glycol (SYSTANE) 0.4-0.3 % SOLN Place 1 drop into both eyes in the morning, at noon, in the evening, and at bedtime.   Yes [provider]  polyethylene glycol (MIRALAX / GLYCOLAX) 17 g packet Take 17 g by mouth 2 (two) times daily.   Yes [provider]  Sennosides (SENNA) 8.6 MG CAPS Take 2 capsules by mouth every 12 (twelve) hours as needed (constipation). 06/23/21  Yes [provider]  sertraline (ZOLOFT) 100 MG tablet Take 100 mg by mouth daily. 03/25/21  Yes [provider]  tamsulosin (FLOMAX) 0.4 MG  CAPS capsule Take 0.4 mg by mouth daily. 11/27/21  Yes [provider]  topiramate (TOPAMAX) 25 MG tablet Take 25 mg by mouth daily. Give 50mg  tablet with a 25mg  tablet for total dose 75mg    Yes [provider]  topiramate (TOPAMAX) 50 MG tablet Take 50 mg by mouth daily. Give 50mg  tablet with a 25mg  tablet for total dose 75mg    Yes [provider]  traZODone (DESYREL) 150 MG tablet Take 150 mg by mouth at bedtime.   Yes [provider]  aspirin EC 81 MG tablet Take 81 mg by mouth daily. Patient not taking: Reported on 05/19/2022    [provider]     Critical care time:     This patient is critically ill with multiple organ system failure which requires frequent high complexity decision making, assessment, support, evaluation, and titration of  therapies. This was completed through the application of advanced monitoring technologies and extensive interpretation of multiple databases.  During this encounter critical care time was devoted to patient care services described in this note for 44 minutes.    Cheri Fowler, MD [facility] See Amion for pager If no response to pager, please call [phone] until 7pm After 7pm, Please call E-link [phone]

## 2022-05-24 NOTE — Progress Notes (Signed)
Received automated USGPIV consult for vasopressor. Arrived to patient's room. At this time the nurse is awaiting team for CVC line placement. Tomasita Morrow, RN [facility]

## 2022-05-25 ENCOUNTER — Encounter (HOSPITAL_COMMUNITY): Payer: Self-pay | Admitting: Neurosurgery

## 2022-05-25 ENCOUNTER — Inpatient Hospital Stay (HOSPITAL_COMMUNITY): Payer: Medicare Other

## 2022-05-25 DIAGNOSIS — R579 Shock, unspecified: Secondary | ICD-10-CM | POA: Diagnosis not present

## 2022-05-25 DIAGNOSIS — G40901 Epilepsy, unspecified, not intractable, with status epilepticus: Secondary | ICD-10-CM | POA: Diagnosis not present

## 2022-05-25 DIAGNOSIS — Z9889 Other specified postprocedural states: Secondary | ICD-10-CM | POA: Diagnosis not present

## 2022-05-25 DIAGNOSIS — J9601 Acute respiratory failure with hypoxia: Secondary | ICD-10-CM | POA: Diagnosis not present

## 2022-05-25 DIAGNOSIS — I6389 Other cerebral infarction: Secondary | ICD-10-CM | POA: Diagnosis not present

## 2022-05-25 LAB — BASIC METABOLIC PANEL
Anion gap: 12 (ref 5–15)
Anion gap: 10 (ref 5–15)
BUN: 16 mg/dL (ref 6–20)
BUN: 15 mg/dL (ref 6–20)
CO2: 16 mmol/L — ABNORMAL LOW (ref 22–32)
CO2: 19 mmol/L — ABNORMAL LOW (ref 22–32)
Calcium: 7.6 mg/dL — ABNORMAL LOW (ref 8.9–10.3)
Calcium: 8.1 mg/dL — ABNORMAL LOW (ref 8.9–10.3)
Chloride: 109 mmol/L (ref 98–111)
Chloride: 111 mmol/L (ref 98–111)
Creatinine, Ser: 1.3 mg/dL — ABNORMAL HIGH (ref 0.61–1.24)
Creatinine, Ser: 0.99 mg/dL (ref 0.61–1.24)
GFR, Estimated: >60 mL/min (ref >60)
GFR, Estimated: >60 mL/min (ref >60)
Glucose, Bld: 186 mg/dL — ABNORMAL HIGH (ref 70–99)
Glucose, Bld: 148 mg/dL — ABNORMAL HIGH (ref 70–99)
Potassium: 4.6 mmol/L (ref 3.5–5.1)
Potassium: 3.9 mmol/L (ref 3.5–5.1)
Sodium: 137 mmol/L (ref 135–145)
Sodium: 140 mmol/L (ref 135–145)

## 2022-05-25 LAB — MAGNESIUM
Magnesium: 1.7 mg/dL (ref 1.7–2.4)
Magnesium: 1.6 mg/dL — ABNORMAL LOW (ref 1.7–2.4)

## 2022-05-25 LAB — POCT I-STAT 7, (LYTES, BLD GAS, ICA,H+H)
Acid-base deficit: 10 mmol/L — ABNORMAL HIGH (ref 0.0–2.0)
Acid-base deficit: 8 mmol/L — ABNORMAL HIGH (ref 0.0–2.0)
Bicarbonate: 16.4 mmol/L — ABNORMAL LOW (ref 20.0–28.0)
Bicarbonate: 16.6 mmol/L — ABNORMAL LOW (ref 20.0–28.0)
Calcium, Ion: 1.12 mmol/L — ABNORMAL LOW (ref 1.15–1.40)
Calcium, Ion: 1.13 mmol/L — ABNORMAL LOW (ref 1.15–1.40)
HCT: 30 % — ABNORMAL LOW (ref 39.0–52.0)
HCT: 25 % — ABNORMAL LOW (ref 39.0–52.0)
Hemoglobin: 10.2 g/dL — ABNORMAL LOW (ref 13.0–17.0)
Hemoglobin: 8.5 g/dL — ABNORMAL LOW (ref 13.0–17.0)
O2 Saturation: 100 %
O2 Saturation: 99 %
Patient temperature: 99.1
Patient temperature: 37.2
Potassium: 4.5 mmol/L (ref 3.5–5.1)
Potassium: 4.2 mmol/L (ref 3.5–5.1)
Sodium: 139 mmol/L (ref 135–145)
Sodium: 138 mmol/L (ref 135–145)
TCO2: 17 mmol/L — ABNORMAL LOW (ref 22–32)
TCO2: 18 mmol/L — ABNORMAL LOW (ref 22–32)
pCO2 arterial: 35.9 mmHg (ref 32–48)
pCO2 arterial: 30.2 mmHg — ABNORMAL LOW (ref 32–48)
pH, Arterial: 7.269 — ABNORMAL LOW (ref 7.35–7.45)
pH, Arterial: 7.35 (ref 7.35–7.45)
pO2, Arterial: 355 mmHg — ABNORMAL HIGH (ref 83–108)
pO2, Arterial: 132 mmHg — ABNORMAL HIGH (ref 83–108)

## 2022-05-25 LAB — CBC
HCT: 32.2 % — ABNORMAL LOW (ref 39.0–52.0)
Hemoglobin: 10.7 g/dL — ABNORMAL LOW (ref 13.0–17.0)
MCH: 31.3 pg (ref 26.0–34.0)
MCHC: 33.2 g/dL (ref 30.0–36.0)
MCV: 94.2 fL (ref 80.0–100.0)
Platelets: 155 10*3/uL (ref 150–400)
RBC: 3.42 MIL/uL — ABNORMAL LOW (ref 4.22–5.81)
RDW: 13.7 % (ref 11.5–15.5)
WBC: 16.9 10*3/uL — ABNORMAL HIGH (ref 4.0–10.5)
nRBC: 0 % (ref 0.0–0.2)

## 2022-05-25 LAB — GLUCOSE, CAPILLARY
Glucose-Capillary: 113 mg/dL — ABNORMAL HIGH (ref 70–99)
Glucose-Capillary: 134 mg/dL — ABNORMAL HIGH (ref 70–99)
Glucose-Capillary: 91 mg/dL (ref 70–99)
Glucose-Capillary: 94 mg/dL (ref 70–99)

## 2022-05-25 LAB — ECHOCARDIOGRAM COMPLETE
Height: 70 in
Weight: 2927.71 oz

## 2022-05-25 LAB — TRIGLYCERIDES
Triglycerides: 532 mg/dL — ABNORMAL HIGH (ref <150)
Triglycerides: 654 mg/dL — ABNORMAL HIGH (ref <150)

## 2022-05-25 LAB — PHOSPHORUS: Phosphorus: 2.9 mg/dL (ref 2.5–4.6)

## 2022-05-25 LAB — LACTIC ACID, PLASMA
Lactic Acid, Venous: 4.2 mmol/L (ref 0.5–1.9)
Lactic Acid, Venous: 2.9 mmol/L (ref 0.5–1.9)

## 2022-05-25 MED ORDER — INSULIN ASPART 100 UNIT/ML IJ SOLN
0.0000 [IU] | INTRAMUSCULAR | Status: DC
  Administered 2022-05-25 – 2022-05-26 (×3): 2 [IU] via SUBCUTANEOUS
  Administered 2022-05-27 (×2): 3 [IU] via SUBCUTANEOUS
  Administered 2022-05-27: 2 [IU] via SUBCUTANEOUS
  Administered 2022-05-27 (×3): 3 [IU] via SUBCUTANEOUS
  Administered 2022-05-28 (×2): 5 [IU] via SUBCUTANEOUS
  Administered 2022-05-28 (×3): 2 [IU] via SUBCUTANEOUS
  Administered 2022-05-28: 3 [IU] via SUBCUTANEOUS
  Administered 2022-05-29: 8 [IU] via SUBCUTANEOUS
  Administered 2022-05-29 (×3): 3 [IU] via SUBCUTANEOUS
  Administered 2022-05-29 – 2022-05-30 (×2): 5 [IU] via SUBCUTANEOUS
  Administered 2022-05-30 (×3): 3 [IU] via SUBCUTANEOUS
  Administered 2022-05-30 (×2): 5 [IU] via SUBCUTANEOUS
  Administered 2022-05-31 (×2): 3 [IU] via SUBCUTANEOUS
  Administered 2022-05-31: 5 [IU] via SUBCUTANEOUS
  Administered 2022-05-31: 3 [IU] via SUBCUTANEOUS
  Administered 2022-05-31: 8 [IU] via SUBCUTANEOUS
  Administered 2022-05-31: 2 [IU] via SUBCUTANEOUS
  Administered 2022-06-01: 11 [IU] via SUBCUTANEOUS
  Administered 2022-06-01: 8 [IU] via SUBCUTANEOUS
  Administered 2022-06-01: 2 [IU] via SUBCUTANEOUS
  Administered 2022-06-01: 11 [IU] via SUBCUTANEOUS
  Administered 2022-06-01: 3 [IU] via SUBCUTANEOUS
  Administered 2022-06-01: 5 [IU] via SUBCUTANEOUS
  Administered 2022-06-01: 11 [IU] via SUBCUTANEOUS
  Administered 2022-06-02: 3 [IU] via SUBCUTANEOUS
  Administered 2022-06-02: 2 [IU] via SUBCUTANEOUS
  Administered 2022-06-02: 3 [IU] via SUBCUTANEOUS
  Administered 2022-06-02: 8 [IU] via SUBCUTANEOUS
  Administered 2022-06-02 – 2022-06-03 (×5): 3 [IU] via SUBCUTANEOUS
  Administered 2022-06-03 – 2022-06-04 (×3): 2 [IU] via SUBCUTANEOUS
  Administered 2022-06-04 (×2): 3 [IU] via SUBCUTANEOUS
  Administered 2022-06-05 – 2022-06-07 (×9): 2 [IU] via SUBCUTANEOUS
  Administered 2022-06-07: 3 [IU] via SUBCUTANEOUS

## 2022-05-25 MED ORDER — LACTATED RINGERS IV BOLUS
500.0000 mL | Freq: Once | INTRAVENOUS | Status: AC
  Administered 2022-05-25: 500 mL via INTRAVENOUS

## 2022-05-25 MED ORDER — LACTATED RINGERS IV BOLUS
1000.0000 mL | Freq: Once | INTRAVENOUS | Status: AC
  Administered 2022-05-25: 1000 mL via INTRAVENOUS

## 2022-05-25 MED ORDER — CALCIUM GLUCONATE-NACL 2-0.675 GM/100ML-% IV SOLN
2.0000 g | Freq: Once | INTRAVENOUS | Status: AC
  Administered 2022-05-25: 2000 mg via INTRAVENOUS
  Filled 2022-05-25: qty 100

## 2022-05-25 MED ORDER — MAGNESIUM SULFATE 2 GM/50ML IV SOLN
2.0000 g | Freq: Once | INTRAVENOUS | Status: AC
  Administered 2022-05-26: 2 g via INTRAVENOUS
  Filled 2022-05-25: qty 50

## 2022-05-25 MED ORDER — PROSOURCE TF20 ENFIT COMPATIBL EN LIQD
60.0000 mL | Freq: Two times a day (BID) | ENTERAL | Status: DC
  Administered 2022-05-25 – 2022-06-06 (×26): 60 mL
  Filled 2022-05-25 (×27): qty 60

## 2022-05-25 MED ORDER — LACTATED RINGERS IV BOLUS
1000.0000 mL | Freq: Once | INTRAVENOUS | Status: AC
  Administered 2022-05-29: 1000 mL via INTRAVENOUS

## 2022-05-25 MED ORDER — MIDAZOLAM-SODIUM CHLORIDE 100-0.9 MG/100ML-% IV SOLN
5.0000 mg/h | INTRAVENOUS | Status: DC
  Administered 2022-05-25 – 2022-05-26 (×3): 10 mg/h via INTRAVENOUS
  Filled 2022-05-25 (×3): qty 100

## 2022-05-25 MED ORDER — OSMOLITE 1.5 CAL PO LIQD
1000.0000 mL | ORAL | Status: DC
  Administered 2022-05-25 – 2022-06-06 (×12): 1000 mL
  Filled 2022-05-25: qty 1000

## 2022-05-25 NOTE — Procedures (Signed)
Central Venous Catheter Insertion Procedure Note  Patrick Franklin  [id number]  Oct 06, 1970  Date:05/25/22  Time:12:56 AM   Provider Performing:Janvi Ammar W Mikey Bussing   Procedure: Insertion of Non-tunneled Central Venous [phone]) with US guidance (56213)   Indication(s) Medication administration  Consent Risks of the procedure as well as the alternatives and risks of each were explained to the patient and/or caregiver.  Consent for the procedure was obtained and is signed in the bedside chart  Anesthesia Topical only with 1% lidocaine   Timeout Verified patient identification, verified procedure, site/side was marked, verified correct patient position, special equipment/implants available, medications/allergies/relevant history reviewed, required imaging and test results available.  Sterile Technique Maximal sterile technique including full sterile barrier drape, hand hygiene, sterile gown, sterile gloves, mask, hair covering, sterile ultrasound probe cover (if used).  Procedure Description Area of catheter insertion was cleaned with chlorhexidine and draped in sterile fashion.  With real-time ultrasound guidance a central venous catheter was placed into the right internal jugular vein. Nonpulsatile blood flow and easy flushing noted in all ports.  The catheter was sutured in place and sterile dressing applied.  Complications/Tolerance None; patient tolerated the procedure well. Chest X-ray is ordered to verify placement for internal jugular or subclavian cannulation.   Chest x-ray is not ordered for femoral cannulation.  EBL Minimal  Specimen(s) None    Joneen Roach, AGACNP-BC [facility]  See Amion for personal pager PCCM on call pager [phone] until 7pm. Please call Elink 7p-7a. [phone]  05/25/2022 12:56 AM

## 2022-05-25 NOTE — Progress Notes (Signed)
NAME:  Patrick Franklin, MRN:  [id number], DOB:  September 25, 1970, LOS: 1 ADMISSION DATE:  05/24/2022, CONSULTATION DATE: 05/24/2022 REFERRING MD: Dr. Julio Sicks, CHIEF COMPLAINT: Altered mental status  History of Present Illness:  52 year old male with hypertension, hyperlipidemia and prior right MCA stroke, complicated with malignant cerebral edema requiring decompressive craniectomy, underwent cranioplasty and VP shunt, who presented with increasing headache, noted to have cranioplasty failure, today he underwent cranioplasty again, upon arrival to ICU patient was not responding, he had forced left gaze deviation, he became hypotensive, hypoxic and started gurgling.  PCCM was consulted for help evaluation medical management  Pertinent  Medical History   Past Medical History:  Diagnosis Date   Aorto-iliac atherosclerosis 08/29/2017   By CT   Asthma    Cardiomegaly 04/13/2016   Cerebral embolism with cerebral infarction 10/03/2018   DVT (deep venous thrombosis) (HCC)    Dysphagia following other cerebrovascular disease    Elevated serum creatinine    Essential hypertension 05/15/2017   GERD without esophagitis    Headache    Hemiparesis affecting left side as late effect of stroke 08/09/2017   History of ischemic right MCA stroke 2017   History of left MCA stroke 2020   History of renal stone 08/22/2017   Stone analysis - calcium stone (08/2017) 8 non obstructing L kidney by CT - will refer to urology (08/2017)   Homonymous bilateral field defects    Hyperlipidemia 05/15/2017   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   MDD (major depressive disorder) 05/15/2017   Neurogenic bladder    Presence of cerebrospinal fluid drainage device    Pulmonary embolism 01/13/2016   Seizure disorder 08/09/2017   Spastic hemiplegia affecting nondominant side 03/26/2018   Subluxation of shoulder joint 04/13/2016   left   Thrombocytopenia 10/01/2018   UTI (urinary tract infection)       Significant Hospital Events: Including procedures, antibiotic start and stop dates in addition to other pertinent events     Interim History / Subjective:  Overnight patient had few more seizures, propofol was uptitrated that led to hypotension requiring vasopressor support Remain afebrile but tachycardic  Objective   Blood pressure 95/82, pulse (!) 141, temperature 99.5 F (37.5 C), resp. rate 16, height 5\' 10"  (1.778 m), weight 83 kg, SpO2 100 %.    Vent Mode: PRVC FiO2 (%):  [40 %-100 %] 40 % Set Rate:  [16 bmp] 16 bmp Vt Set:  [580 mL] 580 mL PEEP:  [5 cmH20] 5 cmH20 Plateau Pressure:  [12 cmH20-17 cmH20] 14 cmH20   Intake/Output Summary (Last 24 hours) at 05/25/2022 0942 Last data filed at 05/25/2022 0700 Gross per 24 hour  Intake 5983.26 ml  Output 2625 ml  Net 3358.26 ml   Filed Weights   05/24/22 0854  Weight: 83 kg    Examination:   Physical exam: General: Crtitically ill-appearing male, orally intubated HEENT: S/p cranioplasty, eyes anicteric.  ETT and OGT in place Neuro: Sedated, not following commands.  Eyes are closed.  Pupils 3 mm bilateral reactive to light.  Positive cough and gag Chest: Coarse breath sounds, no wheezes or rhonchi Heart: Tachycardic, regular rhythm, no murmurs or gallops Abdomen: Soft, nondistended, bowel sounds present Skin: No rash  Labs and images were reviewed  Resolved Hospital Problem list     Assessment & Plan:  Status epilepticus Acute encephalopathy due to recurrent seizures Patient was noted to be actively seizing upon arrival to ICU He had few seizures in OR Overnight he had 2  more seizures Propofol was uptitrated to 60 mics Will get continue video EEG This post loading dose of Keppra Continue Keppra, Topamax and Depakote Continue seizure precautions  Acute hypoxic respiratory failure Possible aspiration pneumonitis During seizures patient was noted to be hypoxic, he started gurgling His x-ray chest showed  no acute abnormalities He may have acute pneumonitis likely due to aspiration Will get respiratory culture Holding off on antibiotics Continue lung protective ventilation VAP prevention bundle in place PAD protocol with propofol and as needed fentanyl, RASS goal -2  Lactic acidosis due to recurrent seizures Lactate is trending down Repeat lactic is pending  Acute kidney injury Patient's serum creatinine trended up likely due to ischemic ATN from hypotension Continue aggressive IV fluid resuscitation Monitor intake and output Avoid nephrotoxic agents  Medication induced hypotension with sinus tachycardia Patient requires visit pressor support due to being on high-dose sedating medication including propofol and fentanyl Currently on Levophed and vasopressin, monitor with MAP goal 65  Prior right MCA stroke s/p craniectomy and now with cranioplasty Hydrocephalus s/p VP shunt Postop management defer to neurosurgery Monitor drain output, it will be removed per neurosurgery  Best Practice (right click and "Reselect all SmartList Selections" daily)   Diet/type: NPO start tube feeds DVT prophylaxis: SCD GI prophylaxis: PPI Lines: NA Foley:  Yes, still needed Code Status:  full code Last date of multidisciplinary goals of care discussion [4/30: Patient's family was updated at bedside]  Labs   CBC: Recent Labs  Lab 05/23/22 0954 05/24/22 1625 05/24/22 2132 05/25/22 0338 05/25/22 0500  WBC 4.9  --  18.9*  --  16.9*  NEUTROABS  --   --  13.4*  --   --   HGB 15.3 12.2* 13.0 10.2* 10.7*  HCT 49.0 36.0* 40.9 30.0* 32.2*  MCV 96.8  --  96.5  --  94.2  PLT 81*  --  188  --  155    Basic Metabolic Panel: Recent Labs  Lab 05/23/22 0954 05/24/22 1625 05/25/22 0338 05/25/22 0500  NA 142 142 139 137  K 3.8 3.7 4.5 4.6  CL 109  --   --  109  CO2 26  --   --  16*  GLUCOSE 99  --   --  186*  BUN 14  --   --  16  CREATININE 0.89  --   --  1.30*  CALCIUM 8.7*  --   --  7.6*    GFR: Estimated Creatinine Clearance: 70.2 mL/min (A) (by C-G formula based on SCr of 1.3 mg/dL (H)). Recent Labs  Lab 05/23/22 0954 05/24/22 1704 05/24/22 1906 05/24/22 2132 05/25/22 0500  WBC 4.9  --   --  18.9* 16.9*  LATICACIDVEN  --  5.9* 4.9*  --   --     Liver Function Tests: No results for input(s): "AST", "ALT", "ALKPHOS", "BILITOT", "PROT", "ALBUMIN" in the last 168 hours. No results for input(s): "LIPASE", "AMYLASE" in the last 168 hours. No results for input(s): "AMMONIA" in the last 168 hours.  ABG    Component Value Date/Time   PHART 7.269 (L) 05/25/2022 0338   PCO2ART 35.9 05/25/2022 0338   PO2ART 355 (H) 05/25/2022 0338   HCO3 16.4 (L) 05/25/2022 0338   TCO2 17 (L) 05/25/2022 0338   ACIDBASEDEF 10.0 (H) 05/25/2022 0338   O2SAT 100 05/25/2022 0338     Coagulation Profile: No results for input(s): "INR", "PROTIME" in the last 168 hours.  Cardiac Enzymes: No results for input(s): "CKTOTAL", "CKMB", "CKMBINDEX", "  TROPONINI" in the last 168 hours.  HbA1C: Hgb A1c MFr Bld  Date/Time Value Ref Range Status  10/02/2018 03:33 AM 5.2 4.8 - 5.6 % Final    Comment:    (NOTE)         Prediabetes: 5.7 - 6.4         Diabetes: >6.4         Glycemic control for adults with diabetes: <7.0     CBG: Recent Labs  Lab 05/24/22 1316 05/24/22 1437  GLUCAP 126* 187*    Critical care time:     This patient is critically ill with multiple organ system failure which requires frequent high complexity decision making, assessment, support, evaluation, and titration of therapies. This was completed through the application of advanced monitoring technologies and extensive interpretation of multiple databases.  During this encounter critical care time was devoted to patient care services described in this note for 36 minutes.    Cheri Fowler, MD [facility] See Amion for pager If no response to pager, please call [phone] until 7pm After 7pm,  Please call E-link [phone]

## 2022-05-25 NOTE — Progress Notes (Signed)
Sputum collected and sent to lab at this time °

## 2022-05-25 NOTE — Progress Notes (Signed)
Initial Nutrition Assessment  DOCUMENTATION CODES:   Not applicable  INTERVENTION:   Initiate tube feeding via OG tube: Osmolite 1.5 at 25 ml/h and increase by 10 ml every 8 hours to goal rate of 55 ml/hr (1320 ml per day) Prosource TF20 60 ml BID  Provides 2140 kcal, 122 gm protein, 1003 ml free water daily  Continue MVI with minerals   Monitor magnesium and phosphorus every 12 hours x 4 occurrences, MD to replete as needed, as pt is at risk for refeeding syndrome given hx weight loss.  NUTRITION DIAGNOSIS:   Inadequate oral intake related to inability to eat as evidenced by NPO status.  GOAL:   Patient will meet greater than or equal to 90% of their needs  MONITOR:   TF tolerance  REASON FOR ASSESSMENT:   Consult Enteral/tube feeding initiation and management  ASSESSMENT:   Pt with PMH of HTN, HLD, spastic hemiplegia of L side, and prior R MCA stroke which required decompressive craniectomy s/p cranioplasty and VP shunt now admitted with cranioplasty failure s/p revision of R fronto temporoparietal cranioplasty and developed post op seizures.    Pt sedated and intubated A-line MAP >73 on 2 pressors with sedation  Per weight hx review pt has lost 11.3 kg over the last year, 12% weight loss  04/30 - s/p revision of R frontotemporoparietal cranioplasty    Medications reviewed and include: SSI, MVI with minerals, protonix, miralax  Fentanyl  LR @ 100 ml/hr Keppra Versed Levophed @ 4 mcg Vasopressin @ 0.03 units  Labs reviewed:  TG: 654  OG tube; tip in proximal stomach  NUTRITION - FOCUSED PHYSICAL EXAM:  Deferred; RD working remotely   Diet Order:   Diet Order     None       EDUCATION NEEDS:   No education needs have been identified at this time  Skin:  Skin Assessment: Reviewed RN Assessment (head incision)  Last BM:  unknown  Height:   Ht Readings from Last 1 Encounters:  05/24/22 5\' 10"  (1.778 m)    Weight:   Wt Readings from  Last 1 Encounters:  05/24/22 83 kg    BMI:  Body mass index is 26.26 kg/m.  Estimated Nutritional Needs:   Kcal:  2100-2400  Protein:  100-125 grams  Fluid:  >2 L/day  Cammy Copa., RD, LDN, CNSC See AMiON for contact information

## 2022-05-25 NOTE — Progress Notes (Addendum)
eLink Physician-Brief Progress Note Patient Name: Patrick Franklin DOB: 03-May-1970 MRN: [id number]   Date of Service  05/25/2022  HPI/Events of Note  Notified of tachycardia HR in 140s. Stable BP. Not a new issue. Has been the same all day and in notes as well. No distress on camera.   eICU Interventions  I ordered EKG and bmp/mag Mag is low and this could contribute to high HR - replacement ordered to keep over 2  Fever also. On tylenol. All these can cause tachycardia. Also 500 cc LR over 1 hour     Intervention Category Major Interventions: Arrhythmia - evaluation and management  Malvin Morrish G Ares Tegtmeyer 05/25/2022, 11:18 PM  Addendum at 2 am - Notified now of HR being 158. Seen on camera. No distress at all. HR seems to have been an issue all day. Was off pressors at start of shift and now on 9 mic/min levophed. Low grade temp +, Is on tube feeds. On 40% fio2, no other changes. Has some mucoid ett secretions nothing major. Not sure if he had aspiration earlier but will start zosyn, give another fluid bolus and send labs including LA. Will also see if Phenylephrine helps instead of levophed because it does seem like his HR is higher after levophed was started. D?w RN.   Addendum at 6:40 am - Did a routine camera check. HR is down to 129. MAP is 70 on 80 mic of Phenylephrine. Levophed is off. Art line is positional and RN is trying to adjust it. It may need to be replaced today if it continues to be an issue. D/w RN.

## 2022-05-25 NOTE — Progress Notes (Signed)
Postop day 1.  Patient continued with some seizure activity overnight.  Propofol now prior.  Drain output is much less bloody and is consistent with CSF now.  Follow-up head CT scan with significant volume of intracranial air and some now left to right shift probably secondary to over drainage from his suction drains.  Suction drains removed today.  This should help with both the pneumocephalus and some of the low pressure symptoms and irritability is having.  Begin continuous EEG monitoring to titrate seizure control.  Overall I am pleased with the appearance of his CT scan I think things will recover as his brain reequilibrated as to its new space.

## 2022-05-25 NOTE — Procedures (Signed)
Intraosseous Needle Insertion Procedure Note    Date:05/25/22  Time:6:57 PM   Provider Performing:Quin Mathenia   Procedure: Insertion Intraosseous (13086)  Indication(s) Medication administration  Consent Unable to obtain consent due to emergent nature of procedure.  Anesthesia Topical only with 1% lidocaine   Timeout Verified patient identification, verified procedure, site/side was marked, verified correct patient position, special equipment/implants available, medications/allergies/relevant history reviewed, required imaging and test results available.  Procedure Description Area of needle insertion was cleaned with chlorhexidine. Intraosseous needle was placed into the right tibia. Bone marrow was aspirated and site easily flushed. The needle was secured in place and dressing applied.  Complications/Tolerance None; patient tolerated the procedure well.  EBL Minimal

## 2022-05-25 NOTE — Progress Notes (Signed)
  Echocardiogram 2D Echocardiogram has been performed.  Patrick Franklin 05/25/2022, 2:17 PM

## 2022-05-25 NOTE — Progress Notes (Signed)
LTM EEG hooked up and running - no initial skin breakdown - push button tested - [facility] monitoring.  

## 2022-05-25 DEATH — deceased

## 2022-05-26 ENCOUNTER — Inpatient Hospital Stay (HOSPITAL_COMMUNITY): Payer: Medicare Other

## 2022-05-26 DIAGNOSIS — J9601 Acute respiratory failure with hypoxia: Secondary | ICD-10-CM | POA: Diagnosis not present

## 2022-05-26 DIAGNOSIS — R569 Unspecified convulsions: Secondary | ICD-10-CM | POA: Diagnosis not present

## 2022-05-26 DIAGNOSIS — Z9889 Other specified postprocedural states: Secondary | ICD-10-CM | POA: Diagnosis not present

## 2022-05-26 DIAGNOSIS — R579 Shock, unspecified: Secondary | ICD-10-CM | POA: Diagnosis not present

## 2022-05-26 LAB — BPAM RBC: Unit Type and Rh: 5100

## 2022-05-26 LAB — CBC WITH DIFFERENTIAL/PLATELET
Abs Immature Granulocytes: 0.09 10*3/uL — ABNORMAL HIGH (ref 0.00–0.07)
Basophils Absolute: 0 10*3/uL (ref 0.0–0.1)
Basophils Relative: 0 %
Eosinophils Absolute: 0 10*3/uL (ref 0.0–0.5)
Eosinophils Relative: 0 %
HCT: 24.3 % — ABNORMAL LOW (ref 39.0–52.0)
Hemoglobin: 8.2 g/dL — ABNORMAL LOW (ref 13.0–17.0)
Immature Granulocytes: 1 %
Lymphocytes Relative: 35 %
Lymphs Abs: 3.7 10*3/uL (ref 0.7–4.0)
MCH: 31.3 pg (ref 26.0–34.0)
MCHC: 33.7 g/dL (ref 30.0–36.0)
MCV: 92.7 fL (ref 80.0–100.0)
Monocytes Absolute: 1.7 10*3/uL — ABNORMAL HIGH (ref 0.1–1.0)
Monocytes Relative: 16 %
Neutro Abs: 5 10*3/uL (ref 1.7–7.7)
Neutrophils Relative %: 48 %
Platelets: 114 10*3/uL — ABNORMAL LOW (ref 150–400)
RBC: 2.62 MIL/uL — ABNORMAL LOW (ref 4.22–5.81)
RDW: 14.3 % (ref 11.5–15.5)
WBC: 10.5 10*3/uL (ref 4.0–10.5)
nRBC: 0 % (ref 0.0–0.2)

## 2022-05-26 LAB — GLUCOSE, CAPILLARY
Glucose-Capillary: 117 mg/dL — ABNORMAL HIGH (ref 70–99)
Glucose-Capillary: 121 mg/dL — ABNORMAL HIGH (ref 70–99)
Glucose-Capillary: 76 mg/dL (ref 70–99)
Glucose-Capillary: 114 mg/dL — ABNORMAL HIGH (ref 70–99)
Glucose-Capillary: 123 mg/dL — ABNORMAL HIGH (ref 70–99)
Glucose-Capillary: 111 mg/dL — ABNORMAL HIGH (ref 70–99)

## 2022-05-26 LAB — POCT I-STAT 7, (LYTES, BLD GAS, ICA,H+H)
Acid-base deficit: 5 mmol/L — ABNORMAL HIGH (ref 0.0–2.0)
Acid-base deficit: 3 mmol/L — ABNORMAL HIGH (ref 0.0–2.0)
Bicarbonate: 19.3 mmol/L — ABNORMAL LOW (ref 20.0–28.0)
Bicarbonate: 22.4 mmol/L (ref 20.0–28.0)
Calcium, Ion: 1.24 mmol/L (ref 1.15–1.40)
Calcium, Ion: 1.14 mmol/L — ABNORMAL LOW (ref 1.15–1.40)
HCT: 22 % — ABNORMAL LOW (ref 39.0–52.0)
HCT: 19 % — ABNORMAL LOW (ref 39.0–52.0)
Hemoglobin: 7.5 g/dL — ABNORMAL LOW (ref 13.0–17.0)
Hemoglobin: 6.5 g/dL — CL (ref 13.0–17.0)
O2 Saturation: 94 %
O2 Saturation: 93 %
Patient temperature: 38.2
Potassium: 3.6 mmol/L (ref 3.5–5.1)
Potassium: 3.3 mmol/L — ABNORMAL LOW (ref 3.5–5.1)
Sodium: 141 mmol/L (ref 135–145)
Sodium: 146 mmol/L — ABNORMAL HIGH (ref 135–145)
TCO2: 20 mmol/L — ABNORMAL LOW (ref 22–32)
TCO2: 24 mmol/L (ref 22–32)
pCO2 arterial: 35 mmHg (ref 32–48)
pCO2 arterial: 38.3 mmHg (ref 32–48)
pH, Arterial: 7.355 (ref 7.35–7.45)
pH, Arterial: 7.375 (ref 7.35–7.45)
pO2, Arterial: 77 mmHg — ABNORMAL LOW (ref 83–108)
pO2, Arterial: 67 mmHg — ABNORMAL LOW (ref 83–108)

## 2022-05-26 LAB — HEMOGLOBIN AND HEMATOCRIT, BLOOD
HCT: 23.4 % — ABNORMAL LOW (ref 39.0–52.0)
Hemoglobin: 7.8 g/dL — ABNORMAL LOW (ref 13.0–17.0)

## 2022-05-26 LAB — LACTIC ACID, PLASMA
Lactic Acid, Venous: 2.6 mmol/L (ref 0.5–1.9)
Lactic Acid, Venous: 2.8 mmol/L (ref 0.5–1.9)
Lactic Acid, Venous: 3.7 mmol/L (ref 0.5–1.9)
Lactic Acid, Venous: 3.5 mmol/L (ref 0.5–1.9)

## 2022-05-26 LAB — CULTURE, RESPIRATORY W GRAM STAIN

## 2022-05-26 LAB — PROCALCITONIN: Procalcitonin: 2.2 ng/mL

## 2022-05-26 LAB — MAGNESIUM: Magnesium: 2.6 mg/dL — ABNORMAL HIGH (ref 1.7–2.4)

## 2022-05-26 LAB — PHOSPHORUS: Phosphorus: 1.3 mg/dL — ABNORMAL LOW (ref 2.5–4.6)

## 2022-05-26 LAB — TYPE AND SCREEN
ABO/RH(D): O POS
Unit division: 0

## 2022-05-26 LAB — PREPARE RBC (CROSSMATCH)

## 2022-05-26 MED ORDER — VANCOMYCIN HCL 2000 MG/400ML IV SOLN: 2000.0000 mg | Freq: Two times a day (BID) | INTRAVENOUS | Status: DC

## 2022-05-26 MED ORDER — VANCOMYCIN HCL 2000 MG/400ML IV SOLN
2000.0000 mg | Freq: Once | INTRAVENOUS | Status: AC
  Administered 2022-05-26: 2000 mg via INTRAVENOUS
  Filled 2022-05-26: qty 400

## 2022-05-26 MED ORDER — SODIUM BICARBONATE 8.4 % IV SOLN: 100.0000 meq | Freq: Once | INTRAVENOUS | Status: AC

## 2022-05-26 MED ORDER — ALBUMIN HUMAN 5 % IV SOLN: 12.5000 g | Freq: Once | INTRAVENOUS | Status: AC

## 2022-05-26 MED ORDER — POTASSIUM PHOSPHATES 15 MMOLE/5ML IV SOLN
45.0000 mmol | Freq: Once | INTRAVENOUS | Status: AC
  Administered 2022-05-26: 45 mmol via INTRAVENOUS
  Filled 2022-05-26: qty 15

## 2022-05-26 MED ORDER — ALBUMIN HUMAN 5 % IV SOLN
25.0000 g | Freq: Once | INTRAVENOUS | Status: AC
  Administered 2022-05-26: 25 g via INTRAVENOUS
  Filled 2022-05-26: qty 500

## 2022-05-26 MED ORDER — SODIUM CHLORIDE 0.9 % IV SOLN
25.0000 mg | Freq: Four times a day (QID) | INTRAVENOUS | Status: AC
  Administered 2022-05-26 – 2022-05-27 (×4): 25 mg via INTRAVENOUS
  Filled 2022-05-26 (×4): qty 1

## 2022-05-26 MED ORDER — SODIUM CHLORIDE 0.9 % IV SOLN: INTRAVENOUS | Status: DC | PRN

## 2022-05-26 MED ORDER — ALBUMIN HUMAN 5 % IV SOLN
INTRAVENOUS | Status: AC
  Filled 2022-05-26: qty 250

## 2022-05-26 MED ORDER — SODIUM CHLORIDE 0.9 % IV SOLN
3.0000 g | Freq: Four times a day (QID) | INTRAVENOUS | Status: DC
  Administered 2022-05-26 – 2022-05-31 (×20): 3 g via INTRAVENOUS
  Filled 2022-05-26 (×20): qty 8

## 2022-05-26 MED ORDER — SODIUM BICARBONATE 8.4 % IV SOLN
100.0000 meq | Freq: Once | INTRAVENOUS | Status: AC
  Administered 2022-05-26: 100 meq via INTRAVENOUS

## 2022-05-26 MED ORDER — LACTATED RINGERS IV BOLUS
1000.0000 mL | Freq: Once | INTRAVENOUS | Status: AC
  Administered 2022-05-26: 1000 mL via INTRAVENOUS

## 2022-05-26 MED ORDER — CALCIUM GLUCONATE-NACL 1-0.675 GM/50ML-% IV SOLN
1.0000 g | Freq: Once | INTRAVENOUS | Status: AC
  Administered 2022-05-26: 1000 mg via INTRAVENOUS
  Filled 2022-05-26: qty 50

## 2022-05-26 MED ORDER — VANCOMYCIN HCL 1250 MG/250ML IV SOLN
1250.0000 mg | Freq: Two times a day (BID) | INTRAVENOUS | Status: DC
  Administered 2022-05-27 – 2022-05-29 (×6): 1250 mg via INTRAVENOUS
  Filled 2022-05-26 (×6): qty 250

## 2022-05-26 MED ORDER — PIPERACILLIN-TAZOBACTAM 3.375 G IVPB
3.3750 g | Freq: Three times a day (TID) | INTRAVENOUS | Status: DC
  Administered 2022-05-26: 3.375 g via INTRAVENOUS
  Filled 2022-05-26: qty 50

## 2022-05-26 MED ORDER — PHENYLEPHRINE CONCENTRATED 100MG/250ML (0.4 MG/ML) INFUSION SIMPLE
0.0000 ug/min | INTRAVENOUS | Status: DC
  Administered 2022-05-26: 180 ug/min via INTRAVENOUS
  Administered 2022-05-27: 150 ug/min via INTRAVENOUS
  Administered 2022-05-27: 370 ug/min via INTRAVENOUS
  Filled 2022-05-26 (×6): qty 250

## 2022-05-26 MED ORDER — SODIUM BICARBONATE 8.4 % IV SOLN
INTRAVENOUS | Status: AC
  Filled 2022-05-26: qty 100

## 2022-05-26 MED ORDER — PHENYLEPHRINE HCL-NACL 20-0.9 MG/250ML-% IV SOLN
INTRAVENOUS | Status: AC
  Filled 2022-05-26: qty 250

## 2022-05-26 MED ORDER — SODIUM CHLORIDE 0.9% IV SOLUTION: Freq: Once | INTRAVENOUS | Status: AC

## 2022-05-26 MED ORDER — PHENYLEPHRINE HCL-NACL 20-0.9 MG/250ML-% IV SOLN
0.0000 ug/min | INTRAVENOUS | Status: DC
  Administered 2022-05-26: 130 ug/min via INTRAVENOUS
  Administered 2022-05-26: 20 ug/min via INTRAVENOUS
  Administered 2022-05-26: 140 ug/min via INTRAVENOUS
  Administered 2022-05-26: 100 ug/min via INTRAVENOUS
  Filled 2022-05-26 (×4): qty 250

## 2022-05-26 MED ORDER — LACTATED RINGERS IV BOLUS
500.0000 mL | Freq: Once | INTRAVENOUS | Status: AC
  Administered 2022-05-26: 500 mL via INTRAVENOUS

## 2022-05-26 NOTE — Progress Notes (Signed)
vLTM maintenance  All impedances below 10kohms.  No skin breakdown noted at all skin sites 

## 2022-05-26 NOTE — Progress Notes (Addendum)
NAME:  Patrick Franklin, MRN:  [id number], DOB:  06-19-1970, LOS: 2 ADMISSION DATE:  05/24/2022, CONSULTATION DATE: 05/24/2022 REFERRING MD: Dr. Julio Sicks, CHIEF COMPLAINT: Altered mental status  History of Present Illness:  52 year old male with hypertension, hyperlipidemia and prior right MCA stroke, complicated with malignant cerebral edema requiring decompressive craniectomy, underwent cranioplasty and VP shunt, who presented with increasing headache, noted to have cranioplasty failure, today he underwent cranioplasty again, upon arrival to ICU patient was not responding, he had forced left gaze deviation, he became hypotensive, hypoxic and started gurgling.  PCCM was consulted for help evaluation medical management  Pertinent  Medical History   Past Medical History:  Diagnosis Date   Aorto-iliac atherosclerosis 08/29/2017   By CT   Asthma    Cardiomegaly 04/13/2016   Cerebral embolism with cerebral infarction 10/03/2018   DVT (deep venous thrombosis) (HCC)    Dysphagia following other cerebrovascular disease    Elevated serum creatinine    Essential hypertension 05/15/2017   GERD without esophagitis    Headache    Hemiparesis affecting left side as late effect of stroke 08/09/2017   History of ischemic right MCA stroke 2017   History of left MCA stroke 2020   History of renal stone 08/22/2017   Stone analysis - calcium stone (08/2017) 8 non obstructing L kidney by CT - will refer to urology (08/2017)   Homonymous bilateral field defects    Hyperlipidemia 05/15/2017   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   MDD (major depressive disorder) 05/15/2017   Neurogenic bladder    Presence of cerebrospinal fluid drainage device    Pulmonary embolism 01/13/2016   Seizure disorder 08/09/2017   Spastic hemiplegia affecting nondominant side 03/26/2018   Subluxation of shoulder joint 04/13/2016   left   Thrombocytopenia 10/01/2018   UTI (urinary tract infection)       Significant Hospital Events: Including procedures, antibiotic start and stop dates in addition to other pertinent events     Interim History / Subjective:  Patient spiked fever overnight with Tmax 100.8 Remained tachycardic Again became hypotensive requiring reinitiation of vasopressor support His hemoglobin dropped down to 7.5 On EEG, read pending  Objective   Blood pressure 103/81, pulse (!) 121, temperature 99.3 F (37.4 C), resp. rate 16, height 5\' 10"  (1.778 m), weight 89.2 kg, SpO2 98 %. CVP:  [8 mmHg-17 mmHg] 12 mmHg  Vent Mode: PRVC FiO2 (%):  [40 %] 40 % Set Rate:  [16 bmp] 16 bmp Vt Set:  [580 mL] 580 mL PEEP:  [5 cmH20] 5 cmH20 Plateau Pressure:  [14 cmH20-15 cmH20] 15 cmH20   Intake/Output Summary (Last 24 hours) at 05/26/2022 0842 Last data filed at 05/26/2022 0700 Gross per 24 hour  Intake 5740.99 ml  Output 1985 ml  Net 3755.99 ml   Filed Weights   05/24/22 0854 05/26/22 0500  Weight: 83 kg 89.2 kg    Examination: Physical exam: General: Crtitically ill-appearing male, orally intubated HEENT: S/p cranioplasty, eyes anicteric.  ETT and OGT in place Neuro: Sedated, not following commands.  Eyes are closed.  Pupils 3 mm bilateral reactive to light Chest: Coarse breath sounds, no wheezes or rhonchi Heart: Tachycardic, regular rhythm, no murmurs or gallops Abdomen: Soft, nondistended, bowel sounds present Skin: No rash  Labs and images were reviewed  Resolved Hospital Problem list     Assessment & Plan:  Status epilepticus Acute encephalopathy due to recurrent seizures Propofol induced hypertriglyceridemia Patient was noted to be actively seizing upon arrival to  ICU and night before NICU He is on the ED which remains completely flat consistent with diffuse encephalopathy likely in the setting of deep sedation Propofol was stopped yesterday due to hypertriglyceridemia He remains on midazolam at 10 mg/h EEG read is pending Will decrease midazolam  to 5 mg/h, while he is on EEG and plan to stop it if no recurrence of seizure Continue Keppra, Topamax and Depakote Continue seizure precautions  Acute hypoxic respiratory failure Possible aspiration pneumonia During seizures patient was noted to be hypoxic, he started gurgling He spiked fever overnight remained tachycardic and still requiring vasopressor support Started on Zosyn overnight, follow-up respiratory culture Will switch Zosyn to Unasyn for possible aspiration pneumonia Continue lung protective ventilation VAP prevention bundle in place PAD protocol with midazolam and as needed fentanyl, RASS goal -1  Lactic acidosis due to recurrent seizures Persistent lactic acidosis, lactate went up overnight to 2.8 Trend lactate  Acute kidney injury Patient's serum creatinine trended up likely due to ischemic ATN from hypotension Serum creatinine improved back to baseline Continue gentle IV fluid resuscitation Monitor intake and output Avoid nephrotoxic agents  Medication induced hypotension with sinus tachycardia Patient requires visit pressor support due to being on high-dose sedating medication including propofol and fentanyl Currently on Levophed and vasopressin, monitor with MAP goal 65  Prior right MCA stroke s/p craniectomy and now with cranioplasty Hydrocephalus s/p VP shunt Postop management defer to neurosurgery Monitor drain output, it will be removed per neurosurgery  Hiccups Patient started with hiccups since yesterday Will start Thorazine to see if that helps  Acute blood loss anemia perioperatively Patient hemoglobin dropped down to 7.5 from 13 upon admission, part of this is dilutional in part due to blood loss Will give 1 unit of PRBC Monitor H&H and transfuse if less than 8  Best Practice (right click and "Reselect all SmartList Selections" daily)   Diet/type: NPO start tube feeds DVT prophylaxis: SCD GI prophylaxis: PPI Lines: NA Foley:  Yes, still  needed Code Status:  full code Last date of multidisciplinary goals of care discussion [4/30: Patient's family was updated at bedside]  Labs   CBC: Recent Labs  Lab 05/23/22 0954 05/24/22 1625 05/24/22 2132 05/25/22 0338 05/25/22 0500 05/25/22 1038 05/26/22 0201 05/26/22 0218  WBC 4.9  --  18.9*  --  16.9*  --  10.5  --   NEUTROABS  --   --  13.4*  --   --   --  5.0  --   HGB 15.3   < > 13.0 10.2* 10.7* 8.5* 8.2* 7.5*  HCT 49.0   < > 40.9 30.0* 32.2* 25.0* 24.3* 22.0*  MCV 96.8  --  96.5  --  94.2  --  92.7  --   PLT 81*  --  188  --  155  --  114*  --    < > = values in this interval not displayed.    Basic Metabolic Panel: Recent Labs  Lab 05/23/22 0954 05/24/22 1625 05/25/22 0338 05/25/22 0500 05/25/22 1038 05/25/22 1449 05/25/22 2209 05/26/22 0201 05/26/22 0218  NA 142   < > 139 137 138  --  140  --  141  K 3.8   < > 4.5 4.6 4.2  --  3.9  --  3.6  CL 109  --   --  109  --   --  111  --   --   CO2 26  --   --  16*  --   --  19*  --   --   GLUCOSE 99  --   --  186*  --   --  148*  --   --   BUN 14  --   --  16  --   --  15  --   --   CREATININE 0.89  --   --  1.30*  --   --  0.99  --   --   CALCIUM 8.7*  --   --  7.6*  --   --  8.1*  --   --   MG  --   --   --   --   --  1.7 1.6* 2.6*  --   PHOS  --   --   --   --   --  2.9  --  1.3*  --    < > = values in this interval not displayed.   GFR: Estimated Creatinine Clearance: 100.4 mL/min (by C-G formula based on SCr of 0.99 mg/dL). Recent Labs  Lab 05/23/22 0954 05/24/22 1704 05/24/22 2132 05/25/22 0500 05/25/22 1021 05/25/22 1301 05/26/22 0201 05/26/22 0508  WBC 4.9  --  18.9* 16.9*  --   --  10.5  --   LATICACIDVEN  --    < >  --   --  4.2* 2.9* 2.6* 2.8*   < > = values in this interval not displayed.    Liver Function Tests: No results for input(s): "AST", "ALT", "ALKPHOS", "BILITOT", "PROT", "ALBUMIN" in the last 168 hours. No results for input(s): "LIPASE", "AMYLASE" in the last 168 hours. No  results for input(s): "AMMONIA" in the last 168 hours.  ABG    Component Value Date/Time   PHART 7.355 05/26/2022 0218   PCO2ART 35.0 05/26/2022 0218   PO2ART 77 (L) 05/26/2022 0218   HCO3 19.3 (L) 05/26/2022 0218   TCO2 20 (L) 05/26/2022 0218   ACIDBASEDEF 5.0 (H) 05/26/2022 0218   O2SAT 94 05/26/2022 0218     Coagulation Profile: No results for input(s): "INR", "PROTIME" in the last 168 hours.  Cardiac Enzymes: No results for input(s): "CKTOTAL", "CKMB", "CKMBINDEX", "TROPONINI" in the last 168 hours.  HbA1C: Hgb A1c MFr Bld  Date/Time Value Ref Range Status  10/02/2018 03:33 AM 5.2 4.8 - 5.6 % Final    Comment:    (NOTE)         Prediabetes: 5.7 - 6.4         Diabetes: >6.4         Glycemic control for adults with diabetes: <7.0     CBG: Recent Labs  Lab 05/25/22 1554 05/25/22 1951 05/25/22 2353 05/26/22 0335 05/26/22 0809  GLUCAP 113* 91 94 117* 121*    Critical care time:     This patient is critically ill with multiple organ system failure which requires frequent high complexity decision making, assessment, support, evaluation, and titration of therapies. This was completed through the application of advanced monitoring technologies and extensive interpretation of multiple databases.  During this encounter critical care time was devoted to patient care services described in this note for 35 minutes.    Cheri Fowler, MD [facility] See Amion for pager If no response to pager, please call [phone] until 7pm After 7pm, Please call E-link [phone]

## 2022-05-26 NOTE — Progress Notes (Signed)
Nutrition Follow-up  DOCUMENTATION CODES:   Not applicable  INTERVENTION:   Tube feeding via OG tube: Osmolite 1.5 advancing by 10 ml every 8 hours to goal rate of 55 ml/hr (1320 ml per day) Prosource TF20 60 ml BID  Provides 2140 kcal, 122 gm protein, 1003 ml free water daily  Continue MVI with minerals   Monitor magnesium and phosphorus every 12 hours x 4 occurrences, MD to replete as needed, as pt is at risk for refeeding syndrome given hx weight loss.  Continue to replete phosphorus   NUTRITION DIAGNOSIS:   Inadequate oral intake related to inability to eat as evidenced by NPO status. Ongoing   GOAL:   Patient will meet greater than or equal to 90% of their needs Progressing towards goal   MONITOR:   TF tolerance  REASON FOR ASSESSMENT:   Consult Enteral/tube feeding initiation and management  ASSESSMENT:   Pt with PMH of HTN, HLD, spastic hemiplegia of L side, and prior R MCA stroke which required decompressive craniectomy s/p cranioplasty and VP shunt now admitted with cranioplasty failure s/p revision of R fronto temporoparietal cranioplasty and developed post op seizures.    Pt sedated and intubated Remains on pressor with deep sedation. Per Neurosurgery may begin to lighten sedation.  + fever, required blood transfusion   Per weight hx review pt has lost 11.3 kg over the last year, 12% weight loss  04/30 - s/p revision of R frontotemporoparietal cranioplasty    Medications reviewed and include: SSI, MVI with minerals, protonix, miralax  Fentanyl  LR @ 100 ml/hr Keppra Versed Neo-synephrine @ 100 mcg  Levophed off Vasopressin off 45 mmol Kphos x 1 IV  Labs reviewed:  PO4 2.9 --> 1.3  TG: 654 (5/1 - prop off)  OG tube; tip in proximal stomach  NUTRITION - FOCUSED PHYSICAL EXAM:  Deferred; RD working remotely   Diet Order:   Diet Order     None       EDUCATION NEEDS:   No education needs have been identified at this  time  Skin:  Skin Assessment: Reviewed RN Assessment (head incision)  Last BM:  unknown  Height:   Ht Readings from Last 1 Encounters:  05/24/22 5\' 10"  (1.778 m)    Weight:   Wt Readings from Last 1 Encounters:  05/26/22 89.2 kg    BMI:  Body mass index is 28.22 kg/m.  Estimated Nutritional Needs:   Kcal:  2100-2400  Protein:  100-125 grams  Fluid:  >2 L/day  Cammy Copa., RD, LDN, CNSC See AMiON for contact information

## 2022-05-26 NOTE — Progress Notes (Signed)
Pharmacy Antibiotic Note  Patrick Franklin is a 52 y.o. male admitted on 05/24/2022 with sepsis.  Pharmacy has been consulted for Vancomycin dosing. Pt also on Unasyn.  Pt loaded with 2000mg  IV vancomycin today ~1240  Plan: Vancomycin 1250 mg IV Q 12 hrs. Goal AUC 400-550. Expected AUC: 480 SCr used: 0.99 Will f/u renal function, micro data, and pt's clinical condition Vanc levels prn   Height: 5\' 10"  (177.8 cm) Weight: 89.2 kg (196 lb 10.4 oz) IBW/kg (Calculated) : 73  Temp (24hrs), Avg:100 F (37.8 C), Min:99.1 F (37.3 C), Max:101.3 F (38.5 C)  Recent Labs  Lab 05/23/22 0954 05/24/22 1704 05/24/22 1906 05/24/22 2132 05/25/22 0500 05/25/22 1021 05/25/22 1301 05/25/22 2209 05/26/22 0201 05/26/22 0508  WBC 4.9  --   --  18.9* 16.9*  --   --   --  10.5  --   CREATININE 0.89  --   --   --  1.30*  --   --  0.99  --   --   LATICACIDVEN  --    < > 4.9*  --   --  4.2* 2.9*  --  2.6* 2.8*   < > = values in this interval not displayed.    Estimated Creatinine Clearance: 100.4 mL/min (by C-G formula based on SCr of 0.99 mg/dL).    No Active Allergies  Antimicrobials this admission: 4/30 Cefazolin >> 5/1 5/2 Zosyn x 1 5/2 Vanc >>  5/2 Unasyn >>  Microbiology results: 5/1 Trach asp:  MRSA PCR:   Thank you for allowing pharmacy to be a part of this patient's care.  Christoper Fabian, PharmD, BCPS Please see amion for complete clinical pharmacist phone list 05/26/2022 4:05 PM

## 2022-05-26 NOTE — Progress Notes (Signed)
Patient is off sedation, started getting this hypotensive and septic with increasing vasopressor requirement His heart rate remained in the 150s, spiking fever Respiratory secretions are thick and tenacious Respiratory culture showing no growth  Will broaden his antibiotics to vancomycin and IV Unasyn Will give him 2 A of bicarbonate 1 g of calcium Repeat lactate, procalcitonin and ABG   Additional critical care time 20 minutes    Cheri Fowler, MD [facility] See Amion for pager If no response to pager, please call [phone] until 7pm After 7pm, Please call E-link [phone]

## 2022-05-26 NOTE — Procedures (Signed)
Patient Name: Patrick Franklin  MRN: [id number]  Epilepsy Attending: Charlsie Quest  Referring Physician/Provider: Cheri Fowler, MD  Duration: 5/1/02024 1010 to 05/26/2022 1010  Patient history: 52 year old male with hypertension, hyperlipidemia and prior right MCA stroke, complicated with malignant cerebral edema requiring decompressive craniectomy, underwent cranioplasty and VP shunt, who presented with increasing headache, noted to have cranioplasty failure, today he underwent cranioplasty again, upon arrival to ICU patient was not responding, he had forced left gaze deviation, he became hypotensive, hypoxic and started gurgling. EEG to evaluate for seizure  Level of alertness:  lethargic /sedated  AEDs during EEG study: LCM, TPM, VPA, LEV, Versed  Technical aspects: This EEG study was done with scalp electrodes positioned according to the 10-20 International system of electrode placement. Electrical activity was reviewed with band pass filter of 1-70Hz , sensitivity of 7 uV/mm, display speed of 91mm/sec with a 60Hz  notched filter applied as appropriate. EEG data were recorded continuously and digitally stored.  Video monitoring was available and reviewed as appropriate.  Description: No clear posterior dominant rhythm was seen. Sleep was characterized by sleep spindles (12 to 14 Hz), maximal frontocentral region. EEG showed continuous generalized and lateralized right hemisphere low amplitude 2-3hz  delta slowing. Hyperventilation and photic stimulation were not performed.     ABNORMALITY - Continuous slow, generalized and lateralized right hemisphere  IMPRESSION: This study is suggestive of cortical dysfunction arising from right hemisphere likely secondary to underlying structural abnormality. Additionally there is severe diffuse encephalopathy, nonspecific etiology but could be secondary to sedation. No seizures or epileptiform discharges were seen throughout the recording.  [name] Annabelle Harman

## 2022-05-26 NOTE — Progress Notes (Signed)
  Transition of Care [facility]) Screening Note   Patient Details  Name: Patrick Franklin Date of Birth: Sep 25, 1970   Transition of Care [facility]) CM/SW Contact:    Mearl Latin, LCSW Phone Number: 05/26/2022, 11:52 AM    Transition of Care Department [facility]) has reviewed patient from [facility]. We will continue to monitor patient advancement through interdisciplinary progression rounds. If new patient transition needs arise, please place a TOC consult.

## 2022-05-26 NOTE — Progress Notes (Signed)
No events or problems overnight.  No evidence of any current seizure activity on EEG.  Low-grade fever.  Remains tachycardic.  Blood pressure still requiring support.  Hematocrit down to 22 this morning.  Other labs unremarkable.  The patient is intubated and sedated.  His pupils are small and symmetric with minimal reactivity.  He has corneals and weak cough and gag.  No movement of his extremities to deep pain.  I believe the patient's brain looks less irritable on the EEG.  I think we may safely start to withdraw sedation.  I am concerned about the patient's anemia.  I wonder if his tachycardia and hypotension will improve with transfusion.  I will defer this to CCM.

## 2022-05-26 NOTE — Procedures (Signed)
Arterial Catheter Insertion Procedure Note  ELHADJ GIRTON  [id number]  1970-05-02  Date:05/26/22  Time:3:50 PM    Provider Performing: Cheri Fowler    Procedure: Insertion of Arterial Line (86578) with US guidance (46962)   Indication(s) Blood pressure monitoring and/or need for frequent ABGs  Consent Risks of the procedure as well as the alternatives and risks of each were explained to the patient and/or caregiver.  Consent for the procedure was obtained and is signed in the bedside chart  Anesthesia None   Time Out Verified patient identification, verified procedure, site/side was marked, verified correct patient position, special equipment/implants available, medications/allergies/relevant history reviewed, required imaging and test results available.   Sterile Technique Maximal sterile technique including full sterile barrier drape, hand hygiene, sterile gown, sterile gloves, mask, hair covering, sterile ultrasound probe cover (if used).   Procedure Description Area of catheter insertion was cleaned with chlorhexidine and draped in sterile fashion. With real-time ultrasound guidance an arterial catheter was placed into the left  Axillary  artery.  Appropriate arterial tracings confirmed on monitor.     Complications/Tolerance None; patient tolerated the procedure well.   EBL Minimal   Specimen(s) None

## 2022-05-27 ENCOUNTER — Inpatient Hospital Stay (HOSPITAL_COMMUNITY): Payer: Medicare Other

## 2022-05-27 DIAGNOSIS — R579 Shock, unspecified: Secondary | ICD-10-CM | POA: Diagnosis not present

## 2022-05-27 DIAGNOSIS — R569 Unspecified convulsions: Secondary | ICD-10-CM | POA: Diagnosis not present

## 2022-05-27 DIAGNOSIS — Z9889 Other specified postprocedural states: Secondary | ICD-10-CM | POA: Diagnosis not present

## 2022-05-27 DIAGNOSIS — G40901 Epilepsy, unspecified, not intractable, with status epilepticus: Secondary | ICD-10-CM | POA: Diagnosis not present

## 2022-05-27 LAB — MAGNESIUM: Magnesium: 2 mg/dL (ref 1.7–2.4)

## 2022-05-27 LAB — AMMONIA: Ammonia: 38 umol/L — ABNORMAL HIGH (ref 9–35)

## 2022-05-27 LAB — CBC
HCT: 28.1 % — ABNORMAL LOW (ref 39.0–52.0)
Hemoglobin: 9.7 g/dL — ABNORMAL LOW (ref 13.0–17.0)
MCH: 31.6 pg (ref 26.0–34.0)
MCHC: 34.5 g/dL (ref 30.0–36.0)
MCV: 91.5 fL (ref 80.0–100.0)
Platelets: 46 10*3/uL — ABNORMAL LOW (ref 150–400)
RBC: 3.07 MIL/uL — ABNORMAL LOW (ref 4.22–5.81)
RDW: 15.5 % (ref 11.5–15.5)
WBC: 3.7 10*3/uL — ABNORMAL LOW (ref 4.0–10.5)
nRBC: 0.5 % — ABNORMAL HIGH (ref 0.0–0.2)

## 2022-05-27 LAB — BASIC METABOLIC PANEL
Anion gap: 7 (ref 5–15)
BUN: 18 mg/dL (ref 6–20)
CO2: 23 mmol/L (ref 22–32)
Calcium: 7.3 mg/dL — ABNORMAL LOW (ref 8.9–10.3)
Chloride: 115 mmol/L — ABNORMAL HIGH (ref 98–111)
Creatinine, Ser: 0.88 mg/dL (ref 0.61–1.24)
GFR, Estimated: >60 mL/min (ref >60)
Glucose, Bld: 184 mg/dL — ABNORMAL HIGH (ref 70–99)
Potassium: 3.6 mmol/L (ref 3.5–5.1)
Sodium: 145 mmol/L (ref 135–145)

## 2022-05-27 LAB — TRIGLYCERIDES
Triglycerides: 91 mg/dL (ref <150)
Triglycerides: 146 mg/dL (ref <150)

## 2022-05-27 LAB — GLUCOSE, CAPILLARY
Glucose-Capillary: 155 mg/dL — ABNORMAL HIGH (ref 70–99)
Glucose-Capillary: 174 mg/dL — ABNORMAL HIGH (ref 70–99)
Glucose-Capillary: 142 mg/dL — ABNORMAL HIGH (ref 70–99)
Glucose-Capillary: 151 mg/dL — ABNORMAL HIGH (ref 70–99)
Glucose-Capillary: 195 mg/dL — ABNORMAL HIGH (ref 70–99)
Glucose-Capillary: 167 mg/dL — ABNORMAL HIGH (ref 70–99)

## 2022-05-27 LAB — TYPE AND SCREEN
Antibody Screen: NEGATIVE
Unit division: 0

## 2022-05-27 LAB — POCT I-STAT 7, (LYTES, BLD GAS, ICA,H+H)
Acid-base deficit: 3 mmol/L — ABNORMAL HIGH (ref 0.0–2.0)
Bicarbonate: 22.2 mmol/L (ref 20.0–28.0)
Calcium, Ion: 1.2 mmol/L (ref 1.15–1.40)
HCT: 20 % — ABNORMAL LOW (ref 39.0–52.0)
Hemoglobin: 6.8 g/dL — CL (ref 13.0–17.0)
O2 Saturation: 99 %
Potassium: 3.5 mmol/L (ref 3.5–5.1)
Sodium: 147 mmol/L — ABNORMAL HIGH (ref 135–145)
TCO2: 23 mmol/L (ref 22–32)
pCO2 arterial: 41.5 mmHg (ref 32–48)
pH, Arterial: 7.337 — ABNORMAL LOW (ref 7.35–7.45)
pO2, Arterial: 127 mmHg — ABNORMAL HIGH (ref 83–108)

## 2022-05-27 LAB — PREPARE RBC (CROSSMATCH)

## 2022-05-27 LAB — PHOSPHORUS: Phosphorus: 2 mg/dL — ABNORMAL LOW (ref 2.5–4.6)

## 2022-05-27 LAB — VALPROIC ACID LEVEL: Valproic Acid Lvl: 72 ug/mL (ref 50.0–100.0)

## 2022-05-27 LAB — BPAM RBC
Blood Product Expiration Date: 202406012359
Unit Type and Rh: 5100

## 2022-05-27 MED ORDER — NOREPINEPHRINE 4 MG/250ML-% IV SOLN
0.0000 ug/min | INTRAVENOUS | Status: DC
  Administered 2022-05-27 – 2022-05-28 (×2): 2 ug/min via INTRAVENOUS
  Filled 2022-05-27 (×4): qty 250

## 2022-05-27 MED ORDER — LEVETIRACETAM IN NACL 1000 MG/100ML IV SOLN
1000.0000 mg | INTRAVENOUS | Status: AC
  Administered 2022-05-27: 1000 mg via INTRAVENOUS
  Filled 2022-05-27: qty 100

## 2022-05-27 MED ORDER — CLONAZEPAM 0.25 MG PO TBDP: 0.5000 mg | ORAL_TABLET | Freq: Two times a day (BID) | ORAL | Status: DC

## 2022-05-27 MED ORDER — POTASSIUM PHOSPHATES 15 MMOLE/5ML IV SOLN
30.0000 mmol | Freq: Once | INTRAVENOUS | Status: AC
  Administered 2022-05-27: 30 mmol via INTRAVENOUS
  Filled 2022-05-27: qty 10

## 2022-05-27 MED ORDER — CLONAZEPAM 0.5 MG PO TABS
0.5000 mg | ORAL_TABLET | Freq: Two times a day (BID) | ORAL | Status: DC
  Administered 2022-05-27 – 2022-05-30 (×7): 0.5 mg
  Filled 2022-05-27 (×7): qty 1

## 2022-05-27 MED ORDER — PROCHLORPERAZINE EDISYLATE 10 MG/2ML IJ SOLN
10.0000 mg | Freq: Four times a day (QID) | INTRAMUSCULAR | Status: AC
  Administered 2022-05-27 – 2022-05-28 (×4): 10 mg via INTRAVENOUS
  Filled 2022-05-27 (×4): qty 2

## 2022-05-27 MED ORDER — TOPIRAMATE 25 MG PO TABS
100.0000 mg | ORAL_TABLET | Freq: Every day | ORAL | Status: DC
  Administered 2022-05-28 – 2022-05-31 (×4): 100 mg
  Filled 2022-05-27 (×4): qty 4

## 2022-05-27 MED ORDER — SENNOSIDES 8.8 MG/5ML PO SYRP
8.8000 mg | ORAL_SOLUTION | Freq: Two times a day (BID) | ORAL | Status: DC | PRN
  Administered 2022-05-27 – 2022-05-30 (×2): 8.8 mg
  Filled 2022-05-27 (×2): qty 5

## 2022-05-27 MED ORDER — LEVETIRACETAM IN NACL 1500 MG/100ML IV SOLN
1500.0000 mg | Freq: Two times a day (BID) | INTRAVENOUS | Status: DC
  Administered 2022-05-27 – 2022-05-29 (×4): 1500 mg via INTRAVENOUS
  Filled 2022-05-27 (×4): qty 100

## 2022-05-27 MED ORDER — PROPOFOL 1000 MG/100ML IV EMUL
5.0000 ug/kg/min | INTRAVENOUS | Status: DC
  Administered 2022-05-27: 5 ug/kg/min via INTRAVENOUS
  Administered 2022-05-27 – 2022-05-29 (×9): 40 ug/kg/min via INTRAVENOUS
  Filled 2022-05-27 (×18): qty 100

## 2022-05-27 MED ORDER — SODIUM CHLORIDE 0.9% IV SOLUTION: Freq: Once | INTRAVENOUS | Status: AC

## 2022-05-27 NOTE — Progress Notes (Signed)
NAME:  Patrick Franklin, MRN:  [id number], DOB:  1970/04/02, LOS: 3 ADMISSION DATE:  05/23/2022, CONSULTATION DATE: 05/05/2022 REFERRING MD: Dr. Julio Sicks, CHIEF COMPLAINT: Altered mental status  History of Present Illness:  52 year old male with hypertension, hyperlipidemia and prior right MCA stroke, complicated with malignant cerebral edema requiring decompressive craniectomy, underwent cranioplasty and VP shunt, who presented with increasing headache, noted to have cranioplasty failure, today he underwent cranioplasty again, upon arrival to ICU patient was not responding, he had forced left gaze deviation, he became hypotensive, hypoxic and started gurgling.  PCCM was consulted for help evaluation medical management  Pertinent  Medical History   Past Medical History:  Diagnosis Date   Aorto-iliac atherosclerosis 08/29/2017   By CT   Asthma    Cardiomegaly 04/13/2016   Cerebral embolism with cerebral infarction 10/03/2018   DVT (deep venous thrombosis) (HCC)    Dysphagia following other cerebrovascular disease    Elevated serum creatinine    Essential hypertension 05/15/2017   GERD without esophagitis    Headache    Hemiparesis affecting left side as late effect of stroke 08/09/2017   History of ischemic right MCA stroke 2017   History of left MCA stroke 2020   History of renal stone 08/22/2017   Stone analysis - calcium stone (08/2017) 8 non obstructing L kidney by CT - will refer to urology (08/2017)   Homonymous bilateral field defects    Hyperlipidemia 05/15/2017   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   MDD (major depressive disorder) 05/15/2017   Neurogenic bladder    Presence of cerebrospinal fluid drainage device    Pulmonary embolism 01/13/2016   Seizure disorder 08/09/2017   Spastic hemiplegia affecting nondominant side 03/26/2018   Subluxation of shoulder joint 04/13/2016   left   Thrombocytopenia 10/01/2018   UTI (urinary tract infection)       Significant Hospital Events: Including procedures, antibiotic start and stop dates in addition to other pertinent events     Interim History / Subjective:  Patient spiked fever overnight with Tmax 100.4 Remained tachycardic but heart rate is between 110-120 Still requiring high-dose vasopressor support with phenylephrine His hemoglobin dropped down to 6.8 again, no signs of active bleeding On EEG, read pending  Objective   Blood pressure 117/66, pulse (!) 103, temperature 97.9 F (36.6 C), resp. rate 16, height 5\' 10"  (1.778 m), weight 89.2 kg, SpO2 100 %. CVP:  [14 mmHg-19 mmHg] 17 mmHg  Vent Mode: PRVC FiO2 (%):  [40 %-100 %] 60 % Set Rate:  [16 bmp-20 bmp] 20 bmp Vt Set:  [580 mL] 580 mL PEEP:  [5 cmH20-10 cmH20] 10 cmH20 Plateau Pressure:  [15 cmH20-23 cmH20] 22 cmH20   Intake/Output Summary (Last 24 hours) at 05/27/2022 0742 Last data filed at 05/27/2022 0600 Gross per 24 hour  Intake 6551.01 ml  Output 1360 ml  Net 5191.01 ml   Filed Weights   05/01/2022 0854 05/26/22 0500  Weight: 83 kg 89.2 kg    Examination:   Physical exam: General: Crtitically ill-appearing male, orally intubated HEENT: [place]/AT, eyes anicteric.  ETT and OGT in place Neuro: Eyes closed, does not open, not following commands, pupils 3 mm nonreactive to light, absent cough and gag Chest: Used air entry at the bases bilaterally, no wheezes or rhonchi Heart: Tachycardic, regular rhythm, no murmurs or gallops Abdomen: Soft, nondistended, bowel sounds present Skin: No rash 1  Labs and images were reviewed  Resolved Hospital Problem list     Assessment & Plan:  Status epilepticus Acute encephalopathy due to recurrent seizures Propofol induced hypertriglyceridemia Patient was noted to be actively seizing upon arrival to ICU and night before NICU EEG showed diffuse encephalopathy, no active seizures, read is pending for today Off all sedation including propofol and Versed Continue Keppra,  Topamax and Depakote Continue seizure precautions Today's neurological exam is worse and despite being off sedation, with absent cough and gag and pupillary reflexes.  Will get stat CT head  Acute hypoxic respiratory failure Severe sepsis with septic shock due to bilateral multifocal pneumonia During seizures patient was noted to be hypoxic, he started gurgling Continued to spike fever, remained tachycardic Requiring high-dose vasopressor support Antibiotics were broadened to Unasyn and vancomycin with improvement in tachycardia His oxygenation has improved on the ventilator with FiO2 titrated down to 50% from 100% yesterday Cultures remain negative Titrate vasopressors for map goal 65 Continue lung protective ventilation VAP prevention bundle in place The patient was also found 0  Lactic acidosis due to recurrent seizures and septic shock Persistent lactic acidosis, lactate went up overnight to 3.7, now creatinine back to 3.5 Trend lactate  Acute kidney injury, improved Patient's serum creatinine trended up likely due to ischemic ATN from hypotension The fluid was discontinued overnight Monitor intake and output Avoid nephrotoxic agents  Prior right MCA stroke s/p craniectomy and now with cranioplasty Hydrocephalus s/p VP shunt Postop management defer to neurosurgery Drain was removed yesterday per neurosurgery  Hiccups Patient continues to have hiccups despite receiving despite receiving 4 doses of Thorazine Will give Compazine to see if that helps  Acute blood loss anemia perioperatively Patient hemoglobin dropped down to 7.5 from 13 upon admission, part of this is dilutional in part due to blood loss No signs of active bleeding, hemoglobin continues to drop Will give him 2 units of PRBCs Monitor H&H and transfuse if less than 8  Best Practice (right click and "Reselect all SmartList Selections" daily)   Diet/type: NPO start tube feeds DVT prophylaxis: SCD GI  prophylaxis: PPI Lines: NA Foley:  Yes, still needed Code Status:  full code Last date of multidisciplinary goals of care discussion [4/30: Patient's family was updated at bedside]  Labs   CBC: Recent Labs  Lab 05/23/22 0954 05/23/2022 1625 05/17/2022 2132 05/25/22 0338 05/25/22 0500 05/25/22 1038 05/26/22 0201 05/26/22 0218 05/26/22 1606 05/26/22 1830 05/27/22 0735  WBC 4.9  --  18.9*  --  16.9*  --  10.5  --   --   --   --   NEUTROABS  --   --  13.4*  --   --   --  5.0  --   --   --   --   HGB 15.3   < > 13.0   < > 10.7*   < > 8.2* 7.5* 6.5* 7.8* 6.8*  HCT 49.0   < > 40.9   < > 32.2*   < > 24.3* 22.0* 19.0* 23.4* 20.0*  MCV 96.8  --  96.5  --  94.2  --  92.7  --   --   --   --   PLT 81*  --  188  --  155  --  114*  --   --   --   --    < > = values in this interval not displayed.    Basic Metabolic Panel: Recent Labs  Lab 05/23/22 0954 05/14/2022 1625 05/25/22 0500 05/25/22 1038 05/25/22 1449 05/25/22 2209 05/26/22 0201 05/26/22 9811 05/26/22 1606 05/27/22 9147  05/27/22 0735  NA 142   < > 137   < >  --  140  --  141 146* 145 147*  K 3.8   < > 4.6   < >  --  3.9  --  3.6 3.3* 3.6 3.5  CL 109  --  109  --   --  111  --   --   --  115*  --   CO2 26  --  16*  --   --  19*  --   --   --  23  --   GLUCOSE 99  --  186*  --   --  148*  --   --   --  184*  --   BUN 14  --  16  --   --  15  --   --   --  18  --   CREATININE 0.89  --  1.30*  --   --  0.99  --   --   --  0.88  --   CALCIUM 8.7*  --  7.6*  --   --  8.1*  --   --   --  7.3*  --   MG  --   --   --   --  1.7 1.6* 2.6*  --   --  2.0  --   PHOS  --   --   --   --  2.9  --  1.3*  --   --  2.0*  --    < > = values in this interval not displayed.   GFR: Estimated Creatinine Clearance: 112.9 mL/min (by C-G formula based on SCr of 0.88 mg/dL). Recent Labs  Lab 05/23/22 0954 05/10/2022 1704 05/23/2022 2132 05/25/22 0500 05/25/22 1021 05/26/22 0201 05/26/22 0508 05/26/22 1627 05/26/22 1834  PROCALCITON  --   --    --   --   --   --   --  2.20  --   WBC 4.9  --  18.9* 16.9*  --  10.5  --   --   --   LATICACIDVEN  --    < >  --   --    < > 2.6* 2.8* 3.7* 3.5*   < > = values in this interval not displayed.    Liver Function Tests: No results for input(s): "AST", "ALT", "ALKPHOS", "BILITOT", "PROT", "ALBUMIN" in the last 168 hours. No results for input(s): "LIPASE", "AMYLASE" in the last 168 hours. No results for input(s): "AMMONIA" in the last 168 hours.  ABG    Component Value Date/Time   PHART 7.337 (L) 05/27/2022 0735   PCO2ART 41.5 05/27/2022 0735   PO2ART 127 (H) 05/27/2022 0735   HCO3 22.2 05/27/2022 0735   TCO2 23 05/27/2022 0735   ACIDBASEDEF 3.0 (H) 05/27/2022 0735   O2SAT 99 05/27/2022 0735     Coagulation Profile: No results for input(s): "INR", "PROTIME" in the last 168 hours.  Cardiac Enzymes: No results for input(s): "CKTOTAL", "CKMB", "CKMBINDEX", "TROPONINI" in the last 168 hours.  HbA1C: Hgb A1c MFr Bld  Date/Time Value Ref Range Status  10/02/2018 03:33 AM 5.2 4.8 - 5.6 % Final    Comment:    (NOTE)         Prediabetes: 5.7 - 6.4         Diabetes: >6.4         Glycemic control for adults with diabetes: <7.0     CBG: Recent  Labs  Lab 05/26/22 1108 05/26/22 1606 05/26/22 1932 05/26/22 2329 05/27/22 0347  GLUCAP 76 114* 123* 111* 155*    Critical care time:     This patient is critically ill with multiple organ system failure which requires frequent high complexity decision making, assessment, support, evaluation, and titration of therapies. This was completed through the application of advanced monitoring technologies and extensive interpretation of multiple databases.  During this encounter critical care time was devoted to patient care services described in this note for 35 minutes.    Cheri Fowler, MD [facility] See Amion for pager If no response to pager, please call [phone] until 7pm After 7pm, Please call E-link  [phone]

## 2022-05-27 NOTE — Progress Notes (Signed)
Neurosurgery  I increased Strata valve setting from 1 to 1.5 based on CT which showed decreased ventricle size, increased subdural collections

## 2022-05-27 NOTE — Progress Notes (Signed)
Pt transported to CT on vent and back to 4N27 without complications.

## 2022-05-27 NOTE — Consult Note (Signed)
Neurology Consultation  Reason for Consult: Seizures  Referring Physician: Dr. Merrily Pew   CC: headaches  History is obtained from:medical record  HPI: Patrick Franklin is a 52 y.o. male with past medical history of R MCA infarct s/p decompressive hemicraniectomy, s/p cranioplasty and VP shunt, HTN, HLD, DVT/PE, Seizures disorder, neurogenic bladder, depression who was admitted on 4/30 for progressive headaches that have worsened over the last 6 months with notable sunken flap requiring cranioplasty revision. He is currently intubated on no sedation and LTM. Sedation was turned off yesterday.  LTM today with "As sedation was weaned, EEG showed evidence of epileptogenicity arising from left occipital region. Gradually after around 2251 on 05/26/2022, patient was noted to have seizures without clinical signs arising from left occipital region, average 3 seizures per hour, lasting about 1 to 1.5 minutes each. "  Neurology asked to assist   ROS:  Unable to obtain due to altered mental status.   Past Medical History:  Diagnosis Date   Aorto-iliac atherosclerosis 08/29/2017   By CT   Asthma    Cardiomegaly 04/13/2016   Cerebral embolism with cerebral infarction 10/03/2018   DVT (deep venous thrombosis) (HCC)    Dysphagia following other cerebrovascular disease    Elevated serum creatinine    Essential hypertension 05/15/2017   GERD without esophagitis    Headache    Hemiparesis affecting left side as late effect of stroke 08/09/2017   History of ischemic right MCA stroke 2017   History of left MCA stroke 2020   History of renal stone 08/22/2017   Stone analysis - calcium stone (08/2017) 8 non obstructing L kidney by CT - will refer to urology (08/2017)   Homonymous bilateral field defects    Hyperlipidemia 05/15/2017   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 His smoking use included cigarettes. He has quit using smokeless tobacco.  His smokeless tobacco use included chew. He reports that he does not currently use alcohol. He reports that he does not use drugs.  Medications  Current Facility-Administered Medications:    0.9 %  sodium chloride infusion, , Intravenous, PRN, Cheri Fowler, MD, Last Rate: 10 mL/hr at 05/26/22 1236, New Bag at 05/26/22 1236   acetaminophen (TYLENOL) tablet 650 mg, 650 mg, Oral, Q4H PRN, 650 mg at 05/26/22 2330 **OR** acetaminophen (TYLENOL) suppository 650 mg, 650 mg, Rectal, Q4H PRN, Julio Sicks, MD, 650 mg at 05/26/22 0118   Ampicillin-Sulbactam (UNASYN) 3 g in sodium chloride 0.9 % 100 mL IVPB, 3 g, Intravenous, Q6H, Chand, Sudham, MD, Last Rate: 200 mL/hr at 05/27/22 0900, Infusion Verify at 05/27/22 0900   atorvastatin (LIPITOR) tablet 80 mg, 80 mg, Per Tube, QHS, Julio Sicks, MD, 80 mg at 05/26/22 2142   bisacodyl (DULCOLAX) suppository 10 mg, 10 mg, Rectal, Daily PRN, Julio Sicks, MD   Chlorhexidine Gluconate Cloth 2 % PADS 6 each, 6 each, Topical, Daily,  Julio Sicks, MD, 6 each at 05/26/22 2017   feeding supplement (OSMOLITE 1.5 CAL) liquid 1,000 mL, 1,000 mL, Per Tube, Continuous, Cheri Fowler, MD, Last Rate: 55 mL/hr at 05/27/22 1012, Restarted at 05/27/22 1012   feeding supplement (PROSource TF20) liquid 60 mL, 60 mL, Per Tube, BID, Cheri Fowler, MD, 60 mL at 05/27/22 1047   fentaNYL in NS (40mcg/ml) infusion-PREMIX, 50-200 mcg/hr, Intravenous, Continuous, Cheri Fowler,  MD, Stopped at 05/27/22 0535   HYDROcodone-acetaminophen (NORCO/VICODIN) 5-325 MG per tablet 1 tablet, 1 tablet, Oral, Q4H PRN, Julio Sicks, MD, 1 tablet at 05/25/22 2242   insulin aspart (novoLOG) injection 0-15 Units, 0-15 Units, Subcutaneous, Q4H, Cheri Fowler, MD, 3 Units at 05/27/22 0805   ipratropium-albuterol (DUONEB) 0.5-2.5 (3) MG/3ML nebulizer solution 3 mL, 3 mL, Nebulization, Q4H PRN, Julio Sicks, MD   lacosamide (VIMPAT) tablet 200 mg, 200 mg, Per Tube, BID, Julio Sicks, MD, 200 mg at 05/27/22 1046   lactated ringers bolus 1,000 mL, 1,000 mL, Intravenous, Once, Duayne Cal, NP   levETIRAcetam (KEPPRA) IVPB 1000 mg/100 mL premix, 1,000 mg, Intravenous, Q12H, Cheri Fowler, MD, Last Rate: 400 mL/hr at 05/27/22 0419, 1,000 mg at 05/27/22 0419   LORazepam (ATIVAN) injection 1-2 mg, 1-2 mg, Intravenous, Q5 min PRN, Migdalia Dk, MD, 2 mg at 05/02/2022 2156   menthol-cetylpyridinium (CEPACOL) lozenge 3 mg, 1 lozenge, Oral, Q2H PRN, Julio Sicks, MD   multivitamin with minerals tablet 1 tablet, 1 tablet, Per Tube, Daily, Julio Sicks, MD, 1 tablet at 05/27/22 1046   norepinephrine (LEVOPHED) 4mg  in (0.016 mg/mL) premix infusion, 0-40 mcg/min, Intravenous, Titrated, Paliwal, Aditya, MD, Last Rate: 7.5 mL/hr at 05/27/22 0900, 2 mcg/min at 05/27/22 0900   ondansetron (ZOFRAN) tablet 4 mg, 4 mg, Oral, Q4H PRN **OR** ondansetron (ZOFRAN) injection 4 mg, 4 mg, Intravenous, Q4H PRN, Julio Sicks, MD   Oral care mouth rinse, 15 mL, Mouth Rinse, Q2H, Pool, Sherilyn Cooter, MD, 15 mL at 05/27/22 0805   Oral care mouth rinse, 15 mL, Mouth Rinse, PRN, Julio Sicks, MD   pantoprazole (PROTONIX) injection 40 mg, 40 mg, Intravenous, QHS, Pool, Sherilyn Cooter, MD, 40 mg at 05/26/22 2142   phenylephrine CONCENTRATED 100mg  in sodium chloride 0.9% (0.4mg /mL) premix infusion, 0-400 mcg/min, Intravenous, Titrated, Francena Hanly, RPH, Last Rate: 22.5 mL/hr at 05/27/22 1020, 150 mcg/min at 05/27/22 1020    polyethylene glycol (MIRALAX / GLYCOLAX) packet 17 g, 17 g, Per Tube, BID, Julio Sicks, MD, 17 g at 05/26/22 2142   potassium PHOSPHATE 30 mmol in dextrose 5 % 500 mL infusion, 30 mmol, Intravenous, Once, Paliwal, Aditya, MD, Last Rate: 85 mL/hr at 05/27/22 0900, Infusion Verify at 05/27/22 0900   prochlorperazine (COMPAZINE) injection 10 mg, 10 mg, Intravenous, Q6H, Chand, Sudham, MD, 10 mg at 05/27/22 1100   promethazine (PHENERGAN) tablet 12.5-25 mg, 12.5-25 mg, Oral, Q4H PRN, Julio Sicks, MD   propofol (DIPRIVAN) 1000 MG/100ML infusion, 5-80 mcg/kg/min, Intravenous, Titrated, Chand, Sudham, MD   sennosides (SENOKOT) 8.8 MG/5ML syrup 8.8 mg, 8.8 mg, Oral, Q12H PRN, Julio Sicks, MD   sertraline (ZOLOFT) tablet 100 mg, 100 mg, Per Tube, Daily, Pool, Sherilyn Cooter, MD, 100 mg at 05/27/22 1046   topiramate (TOPAMAX) tablet 75 mg, 75 mg, Per Tube, Daily, Francena Hanly, RPH, 75 mg at 05/27/22 1046   valproic acid (DEPAKENE) 250 MG/5ML solution 1,000 mg, 1,000 mg, Per Tube, Q12H, Francena Hanly, RPH, 1,000 mg at 05/27/22 0419   vancomycin (VANCOREADY) IVPB 1250 mg/250 mL, 1,250 mg,  Intravenous, Q12H, Titus Mould, RPH, Stopped at 05/27/22 0145   Exam: Current vital signs: BP 101/62   Pulse (!) 130   Temp 98.2 F (36.8 C) (Esophageal)   Resp 17   Ht 5\' 10"  (1.778 m)   Wt 89.2 kg   SpO2 100%   BMI 28.22 kg/m  Vital signs in last 24 hours: Temp:  [97.7 F (36.5 C)-101.3 F (38.5 C)] 98.2 F (36.8 C) (05/03 1043) Pulse Rate:  [101-165] 130 (05/03 1043) Resp:  [0-37] 17 (05/03 1043) BP: (69-181)/(50-87) 101/62 (05/03 0900) SpO2:  [86 %-100 %] 100 % (05/03 1043) Arterial Line BP: (65-117)/(48-80) 117/69 (05/03 1043) FiO2 (%):  [40 %-100 %] 50 % (05/03 1043)  GENERAL: critically ill, on ventilator in NAD HEENT: - Normocephalic and atraumatic, dry mm, ET tube, OG tube  LUNGS - Clear to auscultation bilaterally with no wheezes CV - S1S2 RRR, no m/r/g, equal pulses bilaterally. ABDOMEN -  Soft, nontender, nondistended with normoactive BS Ext: warm, well perfused, intact peripheral pulses, mild edema  NEURO:  Mental Status:  On ventilator with no sedation. Eyes are closed, does not open eyes to voice or painful stimulation. Does not follow commands, no response to painful stimuli. PERRL, gaze fluctuates, doe snot track, weak corneal reflexes, (+) cough and gag. No response to painful stimuli in extremities.  He had nystagmus that was disorganized with at times rightward gaze and then rerturn to midline and then roving movement.   Labs I have reviewed labs in epic and the results pertinent to this consultation are:  CBC    Component Value Date/Time   WBC 10.5 05/26/2022 0201   RBC 2.62 (L) 05/26/2022 0201   HGB 6.8 (LL) 05/27/2022 0735   HCT 20.0 (L) 05/27/2022 0735   PLT 114 (L) 05/26/2022 0201   MCV 92.7 05/26/2022 0201   MCH 31.3 05/26/2022 0201   MCHC 33.7 05/26/2022 0201   RDW 14.3 05/26/2022 0201   LYMPHSABS 3.7 05/26/2022 0201   MONOABS 1.7 (H) 05/26/2022 0201   EOSABS 0.0 05/26/2022 0201   BASOSABS 0.0 05/26/2022 0201    CMP     Component Value Date/Time   NA 147 (H) 05/27/2022 0735   K 3.5 05/27/2022 0735   CL 115 (H) 05/27/2022 0546   CO2 23 05/27/2022 0546   GLUCOSE 184 (H) 05/27/2022 0546   BUN 18 05/27/2022 0546   CREATININE 0.88 05/27/2022 0546   CALCIUM 7.3 (L) 05/27/2022 0546   PROT 6.4 (L) 01/31/2022 1530   ALBUMIN 3.7 01/31/2022 1530   AST 24 01/31/2022 1530   ALT 17 01/31/2022 1530   ALKPHOS 98 01/31/2022 1530   BILITOT 0.5 01/31/2022 1530   GFRNONAA >60 05/27/2022 0546   GFRAA >60 10/19/2019 1558    Lipid Panel     Component Value Date/Time   CHOL 117 10/02/2018 0333   TRIG 91 05/27/2022 0546   HDL 32 (L) 10/02/2018 0333   CHOLHDL 3.7 10/02/2018 0333   VLDL 26 10/02/2018 0333   LDLCALC 59 10/02/2018 0333    Imaging I have reviewed the images obtained:  CT-head  4/30: Status post revision of the previously seen right  cranioplasty with multicompartmental hemorrhage underlying the cranioplasty measuring up to approximately 2.0 cm in thickness. 2. Extensive pneumocephalus overlying both cerebral hemispheres, right worse than left, with drains in place overlying the right cerebral hemisphere. No midline shift.  CT head 5/1:   Large amount of right hemispheric pneumocephalus status post revision of right cranioplasty. 2.  Decreased amount of hemorrhage within the right hemispheric collection, now with 6 mm of rightward midline shift.  CT head 5/3: Interval increase in the volume of extra-axial fluid around the right cerebral convexity, as well as along the falx, with a hematocrit level along the posterior falx. There is likely both an epidural and a subdural hematoma along the right cerebral convexity. The epidural hematoma abutting the craniotomy flap measures up to 1.4 cm and is new from prior. The subdural hematoma is seen along the falx and abutting the site of presumed epidural hematoma measures up to 2.9 cm with a hematocrit level. Interval increase in the degree of leftward midline shift (decreased rightward midline shift). 2. Unchanged subdural hematoma along the left cerebral convexity 3. Left frontal approach ventriculostomy catheter with the tip terminating at the margin of the left lateral ventricle. The ventriculostomy catheter appears slightly retracted compared to prior exam. Interval decrease in size of the ventricular system compared to prior exam. 4. Interval increase in soft tissue swelling/focal soft tissue thickening along the right frontal scalp with an associated locule of gas. Correlate with physical exam to exclude the possibility of infection.  Assessment:  52 year old male admitted for cranioplasty revision on 4/30, with decreased sedation patient with frequent seizures without clinical signs arising from left occipital region  IMPRESSION Post op Status epilepticus   Recommendations: -  Seizure precautions - Start propofol gtt - will likely need to be suppressed, AEDs adjusted. - Check VPA and ammonia level today  - Increase Keppra to 1.5mg  BID  - Increase Topamax to 100 mg daily  - Start clonazepam 0.5mg  BID  - Continue Depakote 1000mg  BID - Continue LTM  - Neurology will continue to follow   Plan d/w care team at bedside  Discussed also in detail with sister, brother in law.  Discussed with Dr. Thalia Party DNP, ACNPC-AG  [facility]   Attending Neurohospitalist Addendum Patient seen and examined with APP/Resident. Agree with the history and physical as documented above. Agree with the plan as documented, which I helped formulate. I have independently reviewed the chart, obtained history, review of systems and examined the patient.I have personally reviewed pertinent head/neck/spine imaging (CT/MRI). Please feel free to call with any questions.  -- Milon Dikes, MD Neurologist [facility] Pager: [phone]    CRITICAL CARE ATTESTATION Performed by: Milon Dikes, MD Total critical care time: 40 minutes Critical care time was exclusive of separately billable procedures and treating other patients and/or supervising APPs/Residents/Students Critical care was necessary to treat or prevent imminent or life-threatening deterioration. This patient is critically ill and at significant risk for neurological worsening and/or death and care requires constant monitoring. Critical care was time spent personally by me on the following activities: development of treatment plan with patient and/or surrogate as well as nursing, discussions with consultants, evaluation of patient's response to treatment, examination of patient, obtaining history from patient or surrogate, ordering and performing treatments and interventions, ordering and review of laboratory studies, ordering and review of radiographic studies, pulse oximetry, re-evaluation of  patient's condition, participation in multidisciplinary rounds and medical decision making of high complexity in the care of this patient.

## 2022-05-27 NOTE — Progress Notes (Signed)
Subjective: EEG showing status epilepticus  Objective: Vital signs in last 24 hours: Temp:  [97.7 F (36.5 C)-101.3 F (38.5 C)] 98.2 F (36.8 C) (05/03 1043) Pulse Rate:  [101-160] 130 (05/03 1043) Resp:  [0-37] 17 (05/03 1043) BP: (69-181)/(50-87) 101/62 (05/03 0900) SpO2:  [86 %-100 %] 100 % (05/03 1043) Arterial Line BP: (65-117)/(48-80) 117/69 (05/03 1043) FiO2 (%):  [50 %-100 %] 50 % (05/03 1043)  Intake/Output from previous day: 05/02 0701 - 05/03 0700 In: 6662.6 [I.V.:2922.3; Blood:315; NG/GT:1320; IV Piggyback:2105.3] Out: 1510 [Urine:1510] Intake/Output this shift: Total I/O In: 352.1 [I.V.:106.5; Blood:30; NG/GT:55; IV Piggyback:160.6] Out: -  On phenylephrine, levophed drip Intubated Eyes closed Pupils 2 mm , reactive to light No movement to stimulus  Lab Results: Recent Labs    05/25/22 0500 05/25/22 1038 05/26/22 0201 05/26/22 0218 05/26/22 1830 05/27/22 0735  WBC 16.9*  --  10.5  --   --   --   HGB 10.7*   < > 8.2*   < > 7.8* 6.8*  HCT 32.2*   < > 24.3*   < > 23.4* 20.0*  PLT 155  --  114*  --   --   --    < > = values in this interval not displayed.   BMET Recent Labs    05/25/22 2209 05/26/22 0218 05/27/22 0546 05/27/22 0735  NA 140   < > 145 147*  K 3.9   < > 3.6 3.5  CL 111  --  115*  --   CO2 19*  --  23  --   GLUCOSE 148*  --  184*  --   BUN 15  --  18  --   CREATININE 0.99  --  0.88  --   CALCIUM 8.1*  --  7.3*  --    < > = values in this interval not displayed.    Studies/Results: CT HEAD WO CONTRAST ( )  Result Date: 05/27/2022 CLINICAL DATA:  Stroke follow-up EXAM: CT HEAD WITHOUT CONTRAST TECHNIQUE: Contiguous axial images were obtained from the base of the skull through the vertex without intravenous contrast. RADIATION DOSE REDUCTION: This exam was performed according to the departmental dose-optimization program which includes automated exposure control, adjustment of the mA and/or kV according to patient size and/or use  of iterative reconstruction technique. COMPARISON:  CT head 05/08/2022 FINDINGS: Brain: Redemonstrated are postsurgical changes from a decompressive right hemicraniectomy. There is a left frontal approach ventriculostomy catheter with the tip terminating at the margin of the left lateral ventricle. The shunt catheter appears slightly retracted compared to prior exam dated 05/17/2022. There is interval decrease in size of the ventricular system compared to prior exam. There is persistent, but decreased pneumocephalus. There is interval increase in the volume of extra-axial fluid around the right cerebral convexity, as well as along the falx. Previously, the parafalcine component measured up to 4 mm, now measures up to 7 mm, when measured in a similar location. There is a hematocrit level along the posterior falx (series 3, image 23). There is an epidural hematoma along the right cerebral convexity (immediately adjacent to the craniotomy flap) that measures up to 1.4 cm (series 3, image 22). There is also a subdural hematoma along the right frontoparietal convexity that measures up to 2.9 cm with a hematocrit level. Subdural hematoma along the left cerebral convexity is grossly unchanged measuring up to 3 mm. There is interval increase in the degree of leftward midline shift. There was previously 1.7 cm rightward midline  shift now there is a proximally 6 mm rightward midline shift. Vascular: No hyperdense vessel or unexpected calcification. Skull: Status post right hemicraniectomy. Compared to prior exam there is interval increase in soft tissue swelling/focal soft tissue thickening along the right frontal scalp (series 5, image 48). Sinuses/Orbits: Trace right mastoid effusion. No middle ear effusion. Paranasal sinuses are notable for an air-fluid level in the right sphenoid sinus. Orbits are unremarkable. Other: None. IMPRESSION: 1. Interval increase in the volume of extra-axial fluid around the right cerebral  convexity, as well as along the falx, with a hematocrit level along the posterior falx. There is likely both an epidural and a subdural hematoma along the right cerebral convexity. The epidural hematoma abutting the craniotomy flap measures up to 1.4 cm and is new from prior. The subdural hematoma is seen along the falx and abutting the site of presumed epidural hematoma measures up to 2.9 cm with a hematocrit level. Interval increase in the degree of leftward midline shift (decreased rightward midline shift). 2. Unchanged subdural hematoma along the left cerebral convexity measuring up to 3 mm. 3. Left frontal approach ventriculostomy catheter with the tip terminating at the margin of the left lateral ventricle. The ventriculostomy catheter appears slightly retracted compared to prior exam. Interval decrease in size of the ventricular system compared to prior exam. 4. Interval increase in soft tissue swelling/focal soft tissue thickening along the right frontal scalp with an associated locule of gas. Correlate with physical exam to exclude the possibility of infection. These results will be called to the ordering clinician or representative by the Radiologist Assistant, and communication documented in the PACS or [employer]. Electronically Signed   By: Lorenza Cambridge M.D.   On: 05/27/2022 10:22   DG CHEST PORT 1 VIEW  Result Date: 05/26/2022 CLINICAL DATA:  Acute hypercapnic respiratory failure EXAM: PORTABLE CHEST 1 VIEW COMPARISON:  Portable exam 1654 hours compared to 05/25/2022 FINDINGS: Tip of endotracheal tube projects 4.7 cm above carina. Nasogastric tube coiled in proximal stomach. RIGHT jugular central venous catheter tip projects over SVC. Normal heart size, mediastinal contours, and pulmonary vascularity. Bibasilar atelectasis greater on LEFT. Remaining lungs clear. No pleural effusion or pneumothorax. IMPRESSION: Mild bibasilar atelectasis greater on LEFT. Electronically Signed   By: Ulyses Southward  M.D.   On: 05/26/2022 18:00   Overnight EEG with video  Result Date: 05/26/2022 Charlsie Quest, MD     05/26/2022 10:47 AM Patient Name: Patrick Franklin MRN: [id number] Epilepsy Attending: Charlsie Quest Referring Physician/Provider: Cheri Fowler, MD Duration: 5/1/02024 1010 to 05/26/2022 1010 Patient history: 52 year old male with hypertension, hyperlipidemia and prior right MCA stroke, complicated with malignant cerebral edema requiring decompressive craniectomy, underwent cranioplasty and VP shunt, who presented with increasing headache, noted to have cranioplasty failure, today he underwent cranioplasty again, upon arrival to ICU patient was not responding, he had forced left gaze deviation, he became hypotensive, hypoxic and started gurgling. EEG to evaluate for seizure Level of alertness:  lethargic /sedated AEDs during EEG study: LCM, TPM, VPA, LEV, Versed Technical aspects: This EEG study was done with scalp electrodes positioned according to the 10-20 International system of electrode placement. Electrical activity was reviewed with band pass filter of 1-70Hz , sensitivity of 7 uV/mm, display speed of 64mm/sec with a 60Hz  notched filter applied as appropriate. EEG data were recorded continuously and digitally stored.  Video monitoring was available and reviewed as appropriate. Description: No clear posterior dominant rhythm was seen. Sleep was characterized by sleep spindles (12  to 14 Hz), maximal frontocentral region. EEG showed continuous generalized and lateralized right hemisphere low amplitude 2-3hz  delta slowing. Hyperventilation and photic stimulation were not performed.   ABNORMALITY - Continuous slow, generalized and lateralized right hemisphere IMPRESSION: This study is suggestive of cortical dysfunction arising from right hemisphere likely secondary to underlying structural abnormality. Additionally there is severe diffuse encephalopathy, nonspecific etiology but could be secondary to  sedation. No seizures or epileptiform discharges were seen throughout the recording. Charlsie Quest   ECHOCARDIOGRAM COMPLETE  Result Date: 05/25/2022    ECHOCARDIOGRAM REPORT   Patient Name:   Patrick Franklin Date of Exam: 05/25/2022 Medical Rec #:  [id number]          Height:       70.0 in Accession #:    [id number]         Weight:       183.0 lb Date of Birth:  1971/01/08         BSA:          2.010 m Patient Age:    51 years           BP:           124/74 mmHg Patient Gender: M                  HR:           141 bpm. Exam Location:  Inpatient Procedure: 2D Echo, Cardiac Doppler and Color Doppler Indications:    Stroke  History:        Patient has prior history of Echocardiogram examinations, most                 recent 10/02/2018. Stroke, Arrythmias:Tachycardia; Risk                 Factors:Hypertension and HLD.  Sonographer:    Lucy Antigua Referring Phys: [id number] VHQION CHAND  Sonographer Comments: Technically difficult study due to poor echo windows and echo performed with patient supine and on artificial respirator. Image acquisition challenging due to patient body habitus. TDS Patient is vented and in tachy. IMPRESSIONS  1. Technically difficult study.  2. Left ventricular ejection fraction, by estimation, is 70 to 75%. The left ventricle has hyperdynamic function. Left ventricular endocardial border not optimally defined to evaluate regional wall motion. Left ventricular diastolic parameters are indeterminate.  3. Right ventricular systolic function is hyperdynamic. The right ventricular size is normal.  4. A small pericardial effusion is present. The pericardial effusion is anterior to the right ventricle.  5. The mitral valve was not well visualized. No evidence of mitral valve regurgitation.  6. The aortic valve was not well visualized. Aortic valve regurgitation is not visualized. FINDINGS  Left Ventricle: Left ventricular ejection fraction, by estimation, is 70 to 75%. The left ventricle has  hyperdynamic function. Left ventricular endocardial border not optimally defined to evaluate regional wall motion. The left ventricular internal cavity size was small. Suboptimal image quality limits for assessment of left ventricular hypertrophy. Left ventricular diastolic parameters are indeterminate. Right Ventricle: The right ventricular size is normal. Right vetricular wall thickness was not well visualized. Right ventricular systolic function is hyperdynamic. Left Atrium: Left atrial size was not well visualized. Right Atrium: Right atrial size was not well visualized. Pericardium: A small pericardial effusion is present. The pericardial effusion is anterior to the right ventricle. Mitral Valve: The mitral valve was not well visualized. No evidence of mitral valve regurgitation. Tricuspid Valve: The tricuspid  valve is not well visualized. Tricuspid valve regurgitation is not demonstrated. Aortic Valve: The aortic valve was not well visualized. Aortic valve regurgitation is not visualized. Pulmonic Valve: The pulmonic valve was not well visualized. Pulmonic valve regurgitation is not visualized. Aorta: The ascending aorta was not well visualized and the aortic root was not well visualized. IAS/Shunts: The interatrial septum was not well visualized.  LEFT VENTRICLE PLAX 2D LVOT diam:     2.30 cm LVOT Area:     4.15 cm   SHUNTS Systemic Diam: 2.30 cm Riley Lam MD Electronically signed by Riley Lam MD Signature Date/Time: 05/25/2022/3:55:48 PM    Final     Assessment/Plan: S/p revision cranioplasty  LOS: 3 days  - postoperative status epilepticus, will likely require burst suppresion - CT head this morning was reviewed.  Complex postoperative changes related to highly compliant brain and brain fluid dynamics.  He clearly will need time to reequilibrate - unclear if shunt setting should be changed-- he was in good equilibrium with this setting preoperatively.  With increase in cranial  cavity space postoperatively, it is possible a higher setting with less drainage may be beneficial. - no surgical intervention indicated    Bedelia Person 05/27/2022, 11:24 AM

## 2022-05-27 NOTE — Progress Notes (Addendum)
eLink Physician-Brief Progress Note Patient Name: ASAPH GLECKLER DOB: Oct 02, 1970 MRN: [id number]   Date of Service  05/27/2022  HPI/Events of Note  52 year old male that presented with progressive headaches in the setting of known MCA infarction andcranioplasty reduced hemorrhage, becoming more hypotensive and febrile lactate.  CVP is 17.  Phenylephrine at 375 mcg.  A-line and cuff similar but not a perfect correlation  eICU Interventions  Continue to utilize the arterial line for monitoring, waveform appears quite appropriate.  Discontinue continuous IV fluids given elevated CVP.  Maintain phenylephrine, add on norepinephrine.   6644 - Phos 2.0, K+ 3.6; kPhos ordered   Intervention Category Intermediate Interventions: Hypotension - evaluation and management  Denys Salinger 05/27/2022, 4:23 AM

## 2022-05-27 NOTE — Procedures (Signed)
Patient Name: Patrick Franklin  MRN: [id number]  Epilepsy Attending: Charlsie Quest  Referring Physician/Provider: Cheri Fowler, MD  Duration: 5/2/02024 1010 to 05/27/2022 1100   Patient history: 52 year old male with hypertension, hyperlipidemia and prior right MCA stroke, complicated with malignant cerebral edema requiring decompressive craniectomy, underwent cranioplasty and VP shunt, who presented with increasing headache, noted to have cranioplasty failure, today he underwent cranioplasty again, upon arrival to ICU patient was not responding, he had forced left gaze deviation, he became hypotensive, hypoxic and started gurgling. EEG to evaluate for seizure   Level of alertness:  lethargic /sedated   AEDs during EEG study: LCM, TPM, VPA, LEV   Technical aspects: This EEG study was done with scalp electrodes positioned according to the 10-20 International system of electrode placement. Electrical activity was reviewed with band pass filter of 1-70Hz , sensitivity of 7 uV/mm, display speed of 57mm/sec with a 60Hz  notched filter applied as appropriate. EEG data were recorded continuously and digitally stored.  Video monitoring was available and reviewed as appropriate.   Description: EEG initially showed continuous generalized and lateralized right hemisphere low amplitude 2-3hz  delta slowing. After around 1630 on 05/26/2022, eeg showed polyspikes arising from left occipital region.  Gradually, the frequency of spikes worsened and were noted with fluctuating frequency of 0.5 to 4 Hz.  After around 2030 on 05/26/2022, the spikes were noted in runs lasting 3 to 8 seconds.  Gradually after around 2251, seizures without clinical signs were noted arising from left occipital region.  During seizure, EEG showed polyspikes 10 left occipital region which also involved all of left hemisphere as well as right occipital region and evolved in morphology and frequency.  Average 3 seizures were noted per hour, lasting  about 1 to 1.5-minute each.  In between seizures, patient was noted to have brief ictal-interictal rhythmic discharges arising from left occipital region lasting 3 to 8 seconds.  Hyperventilation and photic stimulation were not performed.      EEG was disconnected between  05/27/2022 0920 to 1003 for testing.    ABNORMALITY -Seizures without clinical signs, left occipital region -Brief ictal-interictal rhythmic discharges, left occipital region - Continuous slow, generalized and lateralized right hemisphere   IMPRESSION: This study was initially suggestive of severe diffuse encephalopathy, likely related to sedation.  As sedation was weaned, EEG showed evidence of epileptogenicity arising from left occipital region.  Gradually after around 2251 on 05/26/2022, patient was noted to have seizures without clinical signs arising from left occipital region, average 3 seizures per hour, lasting about 1 to 1.5 minutes each.   Dr Merrily Pew was notified.   Patrick Franklin

## 2022-05-27 NOTE — Anesthesia Postprocedure Evaluation (Signed)
Anesthesia Post Note  Patient: Patrick Franklin  Procedure(s) Performed: Right Revision Cranioplasty (Right)     Patient location during evaluation: PACU Anesthesia Type: General Level of consciousness: awake and alert Pain management: pain level controlled Vital Signs Assessment: post-procedure vital signs reviewed and stable Respiratory status: spontaneous breathing, nonlabored ventilation, respiratory function stable and patient connected to nasal cannula oxygen Cardiovascular status: blood pressure returned to baseline and stable Postop Assessment: no apparent nausea or vomiting Anesthesia complication: See below. Comments: Pt seized in PACU. Resolved within minutes. No medications administered for seizure. Pt to CT for urgent head CT.   No notable events documented.  Last Vitals:  Vitals:   05/27/22 0900 05/27/22 1004  BP: 101/62   Pulse: (!) 110   Resp: 20   Temp: 36.6 C   SpO2: 100% 99%    Last Pain:  Vitals:   05/27/22 0800  TempSrc: Esophageal  PainSc:                  Stephenia Vogan L Cherene Dobbins

## 2022-05-28 DIAGNOSIS — M952 Other acquired deformity of head: Secondary | ICD-10-CM

## 2022-05-28 DIAGNOSIS — G40901 Epilepsy, unspecified, not intractable, with status epilepticus: Secondary | ICD-10-CM | POA: Diagnosis not present

## 2022-05-28 DIAGNOSIS — Z9889 Other specified postprocedural states: Secondary | ICD-10-CM | POA: Diagnosis not present

## 2022-05-28 DIAGNOSIS — R579 Shock, unspecified: Secondary | ICD-10-CM | POA: Diagnosis not present

## 2022-05-28 DIAGNOSIS — R569 Unspecified convulsions: Secondary | ICD-10-CM | POA: Diagnosis not present

## 2022-05-28 LAB — CBC WITH DIFFERENTIAL/PLATELET
Abs Immature Granulocytes: 0.09 10*3/uL — ABNORMAL HIGH (ref 0.00–0.07)
Basophils Absolute: 0 10*3/uL (ref 0.0–0.1)
Basophils Relative: 0 %
Eosinophils Absolute: 0.2 10*3/uL (ref 0.0–0.5)
Eosinophils Relative: 4 %
HCT: 29.4 % — ABNORMAL LOW (ref 39.0–52.0)
Hemoglobin: 10.2 g/dL — ABNORMAL LOW (ref 13.0–17.0)
Immature Granulocytes: 2 %
Lymphocytes Relative: 26 %
Lymphs Abs: 1.3 10*3/uL (ref 0.7–4.0)
MCH: 31.4 pg (ref 26.0–34.0)
MCHC: 34.7 g/dL (ref 30.0–36.0)
MCV: 90.5 fL (ref 80.0–100.0)
Monocytes Absolute: 0.6 10*3/uL (ref 0.1–1.0)
Monocytes Relative: 12 %
Neutro Abs: 2.9 10*3/uL (ref 1.7–7.7)
Neutrophils Relative %: 56 %
Platelets: 51 10*3/uL — ABNORMAL LOW (ref 150–400)
RBC: 3.25 MIL/uL — ABNORMAL LOW (ref 4.22–5.81)
RDW: 15.5 % (ref 11.5–15.5)
WBC: 5.1 10*3/uL (ref 4.0–10.5)
nRBC: 0.6 % — ABNORMAL HIGH (ref 0.0–0.2)

## 2022-05-28 LAB — GLUCOSE, CAPILLARY
Glucose-Capillary: 128 mg/dL — ABNORMAL HIGH (ref 70–99)
Glucose-Capillary: 125 mg/dL — ABNORMAL HIGH (ref 70–99)
Glucose-Capillary: 205 mg/dL — ABNORMAL HIGH (ref 70–99)
Glucose-Capillary: 206 mg/dL — ABNORMAL HIGH (ref 70–99)
Glucose-Capillary: 128 mg/dL — ABNORMAL HIGH (ref 70–99)
Glucose-Capillary: 188 mg/dL — ABNORMAL HIGH (ref 70–99)

## 2022-05-28 LAB — COMPREHENSIVE METABOLIC PANEL
ALT: 11 U/L (ref 0–44)
AST: 19 U/L (ref 15–41)
Albumin: 1.8 g/dL — ABNORMAL LOW (ref 3.5–5.0)
Alkaline Phosphatase: 55 U/L (ref 38–126)
Anion gap: 9 (ref 5–15)
BUN: 18 mg/dL (ref 6–20)
CO2: 23 mmol/L (ref 22–32)
Calcium: 7.6 mg/dL — ABNORMAL LOW (ref 8.9–10.3)
Chloride: 112 mmol/L — ABNORMAL HIGH (ref 98–111)
Creatinine, Ser: 0.77 mg/dL (ref 0.61–1.24)
GFR, Estimated: >60 mL/min (ref >60)
Glucose, Bld: 141 mg/dL — ABNORMAL HIGH (ref 70–99)
Potassium: 3.4 mmol/L — ABNORMAL LOW (ref 3.5–5.1)
Sodium: 144 mmol/L (ref 135–145)
Total Bilirubin: 0.5 mg/dL (ref 0.3–1.2)
Total Protein: 4 g/dL — ABNORMAL LOW (ref 6.5–8.1)

## 2022-05-28 LAB — TYPE AND SCREEN: Unit division: 0

## 2022-05-28 LAB — BPAM RBC
Blood Product Expiration Date: 202405302359
Blood Product Expiration Date: 202406032359
ISSUE DATE / TIME: 202405020907
ISSUE DATE / TIME: 202405031014
ISSUE DATE / TIME: 202405031157
Unit Type and Rh: 5100

## 2022-05-28 LAB — CULTURE, RESPIRATORY W GRAM STAIN: Gram Stain: NONE SEEN

## 2022-05-28 LAB — PHOSPHORUS: Phosphorus: 2 mg/dL — ABNORMAL LOW (ref 2.5–4.6)

## 2022-05-28 LAB — MAGNESIUM: Magnesium: 1.9 mg/dL (ref 1.7–2.4)

## 2022-05-28 LAB — TRIGLYCERIDES: Triglycerides: 138 mg/dL (ref <150)

## 2022-05-28 MED ORDER — RINGERS IV SOLN: INTRAVENOUS | Status: DC

## 2022-05-28 MED ORDER — MAGNESIUM SULFATE 2 GM/50ML IV SOLN
2.0000 g | Freq: Once | INTRAVENOUS | Status: AC
  Administered 2022-05-28: 2 g via INTRAVENOUS
  Filled 2022-05-28: qty 50

## 2022-05-28 MED ORDER — LACTATED RINGERS IV SOLN: INTRAVENOUS | Status: DC

## 2022-05-28 MED ORDER — POTASSIUM CHLORIDE 20 MEQ PO PACK
20.0000 meq | PACK | Freq: Once | ORAL | Status: AC
  Administered 2022-05-28: 20 meq
  Filled 2022-05-28: qty 1

## 2022-05-28 MED ORDER — POTASSIUM PHOSPHATES 15 MMOLE/5ML IV SOLN
30.0000 mmol | Freq: Once | INTRAVENOUS | Status: AC
  Administered 2022-05-28: 30 mmol via INTRAVENOUS
  Filled 2022-05-28: qty 10

## 2022-05-28 MED ORDER — POTASSIUM PHOSPHATES 15 MMOLE/5ML IV SOLN
15.0000 mmol | Freq: Once | INTRAVENOUS | Status: DC
  Filled 2022-05-28: qty 5

## 2022-05-28 MED ORDER — MIDAZOLAM-SODIUM CHLORIDE 100-0.9 MG/100ML-% IV SOLN
10.0000 mg/h | INTRAVENOUS | Status: DC
  Administered 2022-05-28 – 2022-05-31 (×8): 10 mg/h via INTRAVENOUS
  Filled 2022-05-28 (×8): qty 100

## 2022-05-28 NOTE — Progress Notes (Signed)
NAME:  Patrick Franklin, MRN:  [id number], DOB:  September 09, 1970, LOS: 4 ADMISSION DATE:  04/29/2022, CONSULTATION DATE: 05/03/2022 REFERRING MD: Dr. Julio Sicks, CHIEF COMPLAINT: Altered mental status  History of Present Illness:  52 year old male with hypertension, hyperlipidemia and prior right MCA stroke, complicated with malignant cerebral edema requiring decompressive craniectomy, underwent cranioplasty and VP shunt, who presented with increasing headache, noted to have cranioplasty failure, today he underwent cranioplasty again, upon arrival to ICU patient was not responding, he had forced left gaze deviation, he became hypotensive, hypoxic and started gurgling.  PCCM was consulted for help evaluation medical management  Pertinent  Medical History   Past Medical History:  Diagnosis Date   Aorto-iliac atherosclerosis 08/29/2017   By CT   Asthma    Cardiomegaly 04/13/2016   Cerebral embolism with cerebral infarction 10/03/2018   DVT (deep venous thrombosis) (HCC)    Dysphagia following other cerebrovascular disease    Elevated serum creatinine    Essential hypertension 05/15/2017   GERD without esophagitis    Headache    Hemiparesis affecting left side as late effect of stroke 08/09/2017   History of ischemic right MCA stroke 2017   History of left MCA stroke 2020   History of renal stone 08/22/2017   Stone analysis - calcium stone (08/2017) 8 non obstructing L kidney by CT - will refer to urology (08/2017)   Homonymous bilateral field defects    Hyperlipidemia 05/15/2017   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   MDD (major depressive disorder) 05/15/2017   Neurogenic bladder    Presence of cerebrospinal fluid drainage device    Pulmonary embolism 01/13/2016   Seizure disorder 08/09/2017   Spastic hemiplegia affecting nondominant side 03/26/2018   Subluxation of shoulder joint 04/13/2016   left   Thrombocytopenia 10/01/2018   UTI (urinary tract infection)       Significant Hospital Events: Including procedures, antibiotic start and stop dates in addition to other pertinent events     Interim History / Subjective:  Patient became afebrile His heart rate has improved but still elevated btw 100 -110 Vasopressor requirement is improving, currently on 1 mic of Levophed Hemoglobin remained stable after total 3 units of PRBC transfusion EEG yesterday showed recurrence of active seizures, had to be resedated with propofol  Objective   Blood pressure 102/68, pulse (!) 107, temperature 98.2 F (36.8 C), temperature source Oral, resp. rate 20, height 5\' 10"  (1.778 m), weight 89.2 kg, SpO2 100 %. CVP:  [4 mmHg-52 mmHg] 13 mmHg  Vent Mode: PRVC FiO2 (%):  [40 %-50 %] 40 % Set Rate:  [20 bmp] 20 bmp Vt Set:  [580 mL] 580 mL PEEP:  [8 cmH20] 8 cmH20 Plateau Pressure:  [18 cmH20-20 cmH20] 20 cmH20   Intake/Output Summary (Last 24 hours) at 05/28/2022 0818 Last data filed at 05/28/2022 0600 Gross per 24 hour  Intake 3900.31 ml  Output 1535 ml  Net 2365.31 ml   Filed Weights   05/05/2022 0854 05/26/22 0500  Weight: 83 kg 89.2 kg    Examination: Physical exam: General: Crtitically ill-appearing male, orally intubated HEENT: S/p cranioplasty, eyes anicteric.  ETT and OGT in place Neuro: Sedated, not following commands.  Eyes are closed.  Pupils 3 mm bilateral reactive to light, weak cough and gag Chest: Reduced air entry at the bases bilaterally, no wheezes or rhonchi Heart: Tachycardic, regular rhythm, no murmurs or gallops Abdomen: Soft, nondistended, bowel sounds present Skin: No rash  Labs and images were reviewed  Resolved  Hospital Problem list   Acute kidney injury  Assessment & Plan:  Status epilepticus Acute encephalopathy due to recurrent seizures Propofol induced hypertriglyceridemia Patient started seizing again after stopping sedation He was put back on propofol yesterday EEG reading is pending Neurology was  consulted Keppra was increased to 1.5 g every 12h Topamax was increased to 100 milligram daily Klonopin was added twice daily Continue Depakote Ammonia level is 38 EEG showed multiple spikes, high chances of recurrence of seizure Neurology recommended increasing propofol to keep him in burst suppression for 48 hours Continue seizure precautions Head CT showed postop changes  Acute hypoxic respiratory failure Severe sepsis with septic shock due to bilateral multifocal pneumonia Patient became afebrile, remained tachycardic Vasopressor requirement is improving after initiation of antibiotics, currently on 1 mic of Levophed Continue vancomycin and Unasyn Cultures remain negative Titrate vasopressors for map goal 65 Continue lung protective ventilation VAP prevention bundle in place  Lactic acidosis due to recurrent seizures and septic shock Persistent lactic acidosis, lactate went up overnight to 3.7, now creatinine back to 3.5 Trend lactate  Prior right MCA stroke s/p craniectomy and now with cranioplasty Hydrocephalus s/p VP shunt Postop management defer to neurosurgery Shunt setting was changed yesterday by neurosurgery  Hiccups Patient continues to have hiccups despite Thorazine and Compazine Closely monitor  Acute blood loss anemia perioperatively Patient hemoglobin dropped down to 7.5 from 13 upon admission, part of this is dilutional in part due to blood loss No signs of active bleeding Hemoglobin has stabilized after 3 units of PRBCs total Monitor H&H and transfuse if less than 8  Thrombocytopenia critical illness Platelet count dropped down to 46 Watch for signs of bleeding  Hypokalemia/hypophosphatemia Continue aggressive electrolyte supplement and monitor  Best Practice (right click and "Reselect all SmartList Selections" daily)   Diet/type: NPO start tube feeds DVT prophylaxis: SCD GI prophylaxis: PPI Lines: NA Foley:  Yes, still needed Code Status:   full code Last date of multidisciplinary goals of care discussion [5/4: Patient's sister was updated at bedside]  Labs   CBC: Recent Labs  Lab 05/02/2022 2132 05/25/22 0338 05/25/22 0500 05/25/22 1038 05/26/22 0201 05/26/22 0218 05/26/22 1606 05/26/22 1830 05/27/22 0735 05/27/22 1620 05/28/22 0635  WBC 18.9*  --  16.9*  --  10.5  --   --   --   --  3.7* 5.1  NEUTROABS 13.4*  --   --   --  5.0  --   --   --   --   --  2.9  HGB 13.0   < > 10.7*   < > 8.2*   < > 6.5* 7.8* 6.8* 9.7* 10.2*  HCT 40.9   < > 32.2*   < > 24.3*   < > 19.0* 23.4* 20.0* 28.1* 29.4*  MCV 96.5  --  94.2  --  92.7  --   --   --   --  91.5 90.5  PLT 188  --  155  --  114*  --   --   --   --  46* 51*   < > = values in this interval not displayed.    Basic Metabolic Panel: Recent Labs  Lab 05/23/22 0954 05/09/2022 1625 05/25/22 0500 05/25/22 1038 05/25/22 1449 05/25/22 2209 05/26/22 0201 05/26/22 0218 05/26/22 1606 05/27/22 0546 05/27/22 0735 05/28/22 0635  NA 142   < > 137   < >  --  140  --  141 146* 145 147* 144  K 3.8   < >  4.6   < >  --  3.9  --  3.6 3.3* 3.6 3.5 3.4*  CL 109  --  109  --   --  111  --   --   --  115*  --  112*  CO2 26  --  16*  --   --  19*  --   --   --  23  --  23  GLUCOSE 99  --  186*  --   --  148*  --   --   --  184*  --  141*  BUN 14  --  16  --   --  15  --   --   --  18  --  18  CREATININE 0.89  --  1.30*  --   --  0.99  --   --   --  0.88  --  0.77  CALCIUM 8.7*  --  7.6*  --   --  8.1*  --   --   --  7.3*  --  7.6*  MG  --   --   --   --  1.7 1.6* 2.6*  --   --  2.0  --  1.9  PHOS  --   --   --   --  2.9  --  1.3*  --   --  2.0*  --  2.0*   < > = values in this interval not displayed.   GFR: Estimated Creatinine Clearance: 124.2 mL/min (by C-G formula based on SCr of 0.77 mg/dL). Recent Labs  Lab 05/25/22 0500 05/25/22 1021 05/26/22 0201 05/26/22 0508 05/26/22 1627 05/26/22 1834 05/27/22 1620 05/28/22 0635  PROCALCITON  --   --   --   --  2.20  --   --   --    WBC 16.9*  --  10.5  --   --   --  3.7* 5.1  LATICACIDVEN  --    < > 2.6* 2.8* 3.7* 3.5*  --   --    < > = values in this interval not displayed.    Liver Function Tests: Recent Labs  Lab 05/28/22 0635  AST 19  ALT 11  ALKPHOS 55  BILITOT 0.5  PROT 4.0*  ALBUMIN 1.8*   No results for input(s): "LIPASE", "AMYLASE" in the last 168 hours. Recent Labs  Lab 05/27/22 1620  AMMONIA 38*    ABG    Component Value Date/Time   PHART 7.337 (L) 05/27/2022 0735   PCO2ART 41.5 05/27/2022 0735   PO2ART 127 (H) 05/27/2022 0735   HCO3 22.2 05/27/2022 0735   TCO2 23 05/27/2022 0735   ACIDBASEDEF 3.0 (H) 05/27/2022 0735   O2SAT 99 05/27/2022 0735     Coagulation Profile: No results for input(s): "INR", "PROTIME" in the last 168 hours.  Cardiac Enzymes: No results for input(s): "CKTOTAL", "CKMB", "CKMBINDEX", "TROPONINI" in the last 168 hours.  HbA1C: Hgb A1c MFr Bld  Date/Time Value Ref Range Status  10/02/2018 03:33 AM 5.2 4.8 - 5.6 % Final    Comment:    (NOTE)         Prediabetes: 5.7 - 6.4         Diabetes: >6.4         Glycemic control for adults with diabetes: <7.0     CBG: Recent Labs  Lab 05/27/22 1527 05/27/22 1914 05/27/22 2310 05/28/22 0358 05/28/22 0744  GLUCAP 151* 195* 167* 128* 125*  Critical care time:     This patient is critically ill with multiple organ system failure which requires frequent high complexity decision making, assessment, support, evaluation, and titration of therapies. This was completed through the application of advanced monitoring technologies and extensive interpretation of multiple databases.  During this encounter critical care time was devoted to patient care services described in this note for 34 minutes.    Cheri Fowler, MD  Pulmonary Critical Care See Amion for pager If no response to pager, please call [phone] until 7pm After 7pm, Please call E-link [phone]

## 2022-05-28 NOTE — Plan of Care (Signed)
EEG appears better after adding benzos. Will continue to follow.

## 2022-05-28 NOTE — Procedures (Addendum)
Patient Name: Patrick Franklin  MRN: [id number]  Epilepsy Attending: Charlsie Quest  Referring Physician/Provider: Cheri Fowler, MD  Duration: 05/27/2022 1100 to 05/28/2022 1100   Patient history: 52 year old male with hypertension, hyperlipidemia and prior right MCA stroke, complicated with malignant cerebral edema requiring decompressive craniectomy, underwent cranioplasty and VP shunt, who presented with increasing headache, noted to have cranioplasty failure, today he underwent cranioplasty again, upon arrival to ICU patient was not responding, he had forced left gaze deviation, he became hypotensive, hypoxic and started gurgling. EEG to evaluate for seizure   Level of alertness:  lethargic /sedated   AEDs during EEG study: LCM, TPM, VPA, LEV, Clonazepam, propofol   Technical aspects: This EEG study was done with scalp electrodes positioned according to the 10-20 International system of electrode placement. Electrical activity was reviewed with band pass filter of 1-70Hz , sensitivity of 7 uV/mm, display speed of 74mm/sec with a 60Hz  notched filter applied as appropriate. EEG data were recorded continuously and digitally stored.  Video monitoring was available and reviewed as appropriate.   Description: EEG showed bursts suppression pattern with bursts of generalized 3-6hz  theta-delta slowing admixed with polyspikes in left occipital region lasting 1-4 seconds alternating with 1-3 seconds of generalized eeg suppression   ABNORMALITY - Burst suppression, generalized - Polyspikes, left occipital region    IMPRESSION: This study showed evidence of epileptogenicity arising from left occipital region with increased risk of seizures. Additionally there was profound diffuse encephalopathy, likely related to sedation.     Leeon Makar Annabelle Harman

## 2022-05-28 NOTE — Progress Notes (Signed)
Neurology Progress Note   S:// No family at the bedside.  RN at the bedside. Patient remains intubated on 40 mcg IV propofol, and on LTM.  Patient is also on Levophed drip.  Systolic blood pressure goal 100-160 Official read of LTM is pending for this morning, LTM showing spikes, will increase propofol to 60 mcg.  RN aware.  Critical care aware Neurological exam is unchanged   O:// Current vital signs: BP 102/68   Pulse (!) 107   Temp 98.2 F (36.8 C) (Oral)   Resp 20   Ht 5\' 10"  (1.778 m)   Wt 89.2 kg   SpO2 100%   BMI 28.22 kg/m  Vital signs in last 24 hours: Temp:  [96.4 F (35.8 C)-98.6 F (37 C)] 98.2 F (36.8 C) (05/04 0400) Pulse Rate:  [81-130] 107 (05/04 0800) Resp:  [0-23] 20 (05/04 0800) BP: (95-140)/(63-83) 102/68 (05/04 0800) SpO2:  [96 %-100 %] 100 % (05/04 0807) Arterial Line BP: (95-153)/(59-95) 95/60 (05/04 0800) FiO2 (%):  [40 %-50 %] 40 % (05/04 0807)  GENERAL: Critically ill, male sedated and intubated no apparent distress HEENT: - Normocephalic and atraumatic, dry mm, ET tube, OG tube LUNGS - Clear to auscultation bilaterally with no wheezes CV - S1S2 RRR, no m/r/g, equal pulses bilaterally. ABDOMEN - Soft, nontender, nondistended with normoactive BS Ext: warm, well perfused, intact peripheral pulses, 3+ edema in all extremities  NEURO:  Mental Status: AA&Ox Intubated and sedated on propofol.  Eyes are closed, does not open eyes to voice or painful stimuli.  He does not follow commands and minimal response to painful stimuli.  PERRL-1 mm and sluggish.  On eye-opening eyes are disconjugate, and at times a rightward gaze, roving at times, with nystagmus, eyes do return to midline, he does not track.  We corneal reflexes bilaterally, weak cough no gag.  Very minimal response to painful stimuli in all 4 extremities, does have 4-5 beats of clonus in bilateral lower extremities   Medications  Current Facility-Administered Medications:    0.9 %  sodium  chloride infusion, , Intravenous, PRN, Cheri Fowler, MD, Last Rate: 10 mL/hr at 05/26/22 1236, New Bag at 05/26/22 1236   acetaminophen (TYLENOL) tablet 650 mg, 650 mg, Oral, Q4H PRN, 650 mg at 05/26/22 2330 **OR** acetaminophen (TYLENOL) suppository 650 mg, 650 mg, Rectal, Q4H PRN, Julio Sicks, MD, 650 mg at 05/26/22 0118   Ampicillin-Sulbactam (UNASYN) 3 g in sodium chloride 0.9 % 100 mL IVPB, 3 g, Intravenous, Q6H, Chand, Sudham, MD, Last Rate: 200 mL/hr at 05/28/22 0820, 3 g at 05/28/22 0820   atorvastatin (LIPITOR) tablet 80 mg, 80 mg, Per Tube, QHS, Julio Sicks, MD, 80 mg at 05/27/22 2218   bisacodyl (DULCOLAX) suppository 10 mg, 10 mg, Rectal, Daily PRN, Julio Sicks, MD   Chlorhexidine Gluconate Cloth 2 % PADS 6 each, 6 each, Topical, Daily, Pool, Sherilyn Cooter, MD, 6 each at 05/27/22 1614   clonazePAM (KLONOPIN) tablet 0.5 mg, 0.5 mg, Per Tube, BID, Gevena Mart A, NP, 0.5 mg at 05/27/22 2218   feeding supplement (OSMOLITE 1.5 CAL) liquid 1,000 mL, 1,000 mL, Per Tube, Continuous, Chand, Garnet Sierras, MD, Last Rate: 55 mL/hr at 05/28/22 0600, Infusion Verify at 05/28/22 0600   feeding supplement (PROSource TF20) liquid 60 mL, 60 mL, Per Tube, BID, Chand, Sudham, MD, 60 mL at 05/27/22 2218   fentaNYL in NS (64mcg/ml) infusion-PREMIX, 50-200 mcg/hr, Intravenous, Continuous, Cheri Fowler, MD, Stopped at 05/27/22 0535   HYDROcodone-acetaminophen (NORCO/VICODIN) 5-325 MG per tablet  1 tablet, 1 tablet, Oral, Q4H PRN, Julio Sicks, MD, 1 tablet at 05/25/22 2242   insulin aspart (novoLOG) injection 0-15 Units, 0-15 Units, Subcutaneous, Q4H, Cheri Fowler, MD, 2 Units at 05/28/22 5409   ipratropium-albuterol (DUONEB) 0.5-2.5 (3) MG/3ML nebulizer solution 3 mL, 3 mL, Nebulization, Q4H PRN, Julio Sicks, MD   lacosamide (VIMPAT) tablet 200 mg, 200 mg, Per Tube, BID, Julio Sicks, MD, 200 mg at 05/27/22 2218   lactated ringers bolus 1,000 mL, 1,000 mL, Intravenous, Once, Duayne Cal, NP   levETIRAcetam  (KEPPRA) IVPB 1500 mg/ 100 mL premix, 1,500 mg, Intravenous, Q12H, Milon Dikes, MD, Stopped at 05/28/22 0410   LORazepam (ATIVAN) injection 1-2 mg, 1-2 mg, Intravenous, Q5 min PRN, Migdalia Dk, MD, 2 mg at 05/09/2022 2156   magnesium sulfate IVPB 2 g 50 mL, 2 g, Intravenous, Once, Cheri Fowler, MD   menthol-cetylpyridinium (CEPACOL) lozenge 3 mg, 1 lozenge, Oral, Q2H PRN, Julio Sicks, MD   multivitamin with minerals tablet 1 tablet, 1 tablet, Per Tube, Daily, Julio Sicks, MD, 1 tablet at 05/27/22 1046   norepinephrine (LEVOPHED) 4mg  in (0.016 mg/mL) premix infusion, 0-40 mcg/min, Intravenous, Titrated, Paliwal, Aditya, MD, Stopped at 05/28/22 0402   ondansetron (ZOFRAN) tablet 4 mg, 4 mg, Oral, Q4H PRN **OR** ondansetron (ZOFRAN) injection 4 mg, 4 mg, Intravenous, Q4H PRN, Julio Sicks, MD   Oral care mouth rinse, 15 mL, Mouth Rinse, Q2H, Pool, Sherilyn Cooter, MD, 15 mL at 05/28/22 0609   Oral care mouth rinse, 15 mL, Mouth Rinse, PRN, Julio Sicks, MD   pantoprazole (PROTONIX) injection 40 mg, 40 mg, Intravenous, QHS, Pool, Sherilyn Cooter, MD, 40 mg at 05/27/22 2218   polyethylene glycol (MIRALAX / GLYCOLAX) packet 17 g, 17 g, Per Tube, BID, Julio Sicks, MD, 17 g at 05/27/22 2218   potassium chloride (KLOR-CON) packet 20 mEq, 20 mEq, Per Tube, Once, Jardin, Carla G, RPH   potassium PHOSPHATE 30 mmol in dextrose 5 % 500 mL infusion, 30 mmol, Intravenous, Once, Cheri Fowler, MD   promethazine (PHENERGAN) tablet 12.5-25 mg, 12.5-25 mg, Oral, Q4H PRN, Julio Sicks, MD   propofol (DIPRIVAN) 1000 MG/100ML infusion, 5-80 mcg/kg/min, Intravenous, Titrated, Milon Dikes, MD, Last Rate: 21.4 mL/hr at 05/28/22 0813, 40 mcg/kg/min at 05/28/22 0813   ringers solution, , Intravenous, Continuous, Cheri Fowler, MD   sennosides (SENOKOT) 8.8 MG/5ML syrup 8.8 mg, 8.8 mg, Per Tube, Q12H PRN, Francena Hanly, RPH, 8.8 mg at 05/27/22 1435   sertraline (ZOLOFT) tablet 100 mg, 100 mg, Per Tube, Daily, Pool, Sherilyn Cooter, MD, 100  mg at 05/27/22 1046   topiramate (TOPAMAX) tablet 100 mg, 100 mg, Per Tube, Daily, Milon Dikes, MD   valproic acid (DEPAKENE) 250 MG/5ML solution 1,000 mg, 1,000 mg, Per Tube, Q12H, Francena Hanly, RPH, 1,000 mg at 05/28/22 0353   vancomycin (VANCOREADY) IVPB 1250 mg/250 mL, 1,250 mg, Intravenous, Q12H, Titus Mould, RPH, Stopped at 05/28/22 0113 Labs CBC    Component Value Date/Time   WBC 5.1 05/28/2022 0635   RBC 3.25 (L) 05/28/2022 0635   HGB 10.2 (L) 05/28/2022 0635   HCT 29.4 (L) 05/28/2022 0635   PLT 51 (L) 05/28/2022 0635   MCV 90.5 05/28/2022 0635   MCH 31.4 05/28/2022 0635   MCHC 34.7 05/28/2022 0635   RDW 15.5 05/28/2022 0635   LYMPHSABS 1.3 05/28/2022 0635   MONOABS 0.6 05/28/2022 0635   EOSABS 0.2 05/28/2022 0635   BASOSABS 0.0 05/28/2022 0635    CMP     Component Value  Date/Time   NA 144 05/28/2022 0635   K 3.4 (L) 05/28/2022 0635   CL 112 (H) 05/28/2022 0635   CO2 23 05/28/2022 0635   GLUCOSE 141 (H) 05/28/2022 0635   BUN 18 05/28/2022 0635   CREATININE 0.77 05/28/2022 0635   CALCIUM 7.6 (L) 05/28/2022 0635   PROT 4.0 (L) 05/28/2022 0635   ALBUMIN 1.8 (L) 05/28/2022 0635   AST 19 05/28/2022 0635   ALT 11 05/28/2022 0635   ALKPHOS 55 05/28/2022 0635   BILITOT 0.5 05/28/2022 0635   GFRNONAA >60 05/28/2022 0635   GFRAA >60 10/19/2019 1558     Lipid Panel     Component Value Date/Time   CHOL 117 10/02/2018 0333   TRIG 138 05/28/2022 0636   HDL 32 (L) 10/02/2018 0333   CHOLHDL 3.7 10/02/2018 0333   VLDL 26 10/02/2018 0333   LDLCALC 59 10/02/2018 0333     Imaging I have reviewed images in epic and the results pertinent to this consultation are:  CT-scan of the brain 4/30: Status post revision of the previously seen right cranioplasty with multicompartmental hemorrhage underlying the cranioplasty measuring up to approximately 2.0 cm in thickness. 2. Extensive pneumocephalus overlying both cerebral hemispheres, right worse than left, with  drains in place overlying the right cerebral hemisphere. No midline shift.   CT head 5/1:   Large amount of right hemispheric pneumocephalus status post revision of right cranioplasty. 2. Decreased amount of hemorrhage within the right hemispheric collection, now with 6 mm of rightward midline shift.   CT head 5/3: Interval increase in the volume of extra-axial fluid around the right cerebral convexity, as well as along the falx, with a hematocrit level along the posterior falx. There is likely both an epidural and a subdural hematoma along the right cerebral convexity. The epidural hematoma abutting the craniotomy flap measures up to 1.4 cm and is new from prior. The subdural hematoma is seen along the falx and abutting the site of presumed epidural hematoma measures up to 2.9 cm with a hematocrit level. Interval increase in the degree of leftward midline shift (decreased rightward midline shift). 2. Unchanged subdural hematoma along the left cerebral convexity 3. Left frontal approach ventriculostomy catheter with the tip terminating at the margin of the left lateral ventricle. The ventriculostomy catheter appears slightly retracted compared to prior exam. Interval decrease in size of the ventricular system compared to prior exam. 4. Interval increase in soft tissue swelling/focal soft tissue thickening along the right frontal scalp with an associated locule of gas. Correlate with physical exam to exclude the possibility of infection.   Labs:  VPA 72 Ammonia 38 Phos 2.0 Mg 1.9 K3.4  LTM 5/3-5/4:  - Burst suppression, generalized - Polyspikes, left occipital region  This study showed evidence of epileptogenicity arising from left occipital region with increased risk of seizures. Additionally there was profound diffuse encephalopathy, likely related to sedation.   Assessment:  52 year old male admitted for cranioplasty revision on 4/30, with decreased sedation patient with frequent  seizures without clinical signs arising from left occipital region  Overnight LTM with mild burst suppression pattern and polyspikes in the next occipital region without seizures. Would aim for complete burst suppression for at least 24 to 48 hours before minimizing sedation since minimizing sedation's led to frequent seizures the first time.  Impression: Post op Status epilepticus   Recommendations: - Seizure precautions -Increase propofol gtt from 40 mcg to 60 mcg-for burst suppression.  Reviewed LTM with the epileptologist.  Ideal suppression pattern not reached.  Will reduce propofol down to 40 and add Versed at 10 mg/h.  Will make adjustments based on the EEG leads. -Continue Keppra to 1.5mg  BID, Topamax to 100 mg daily, Depakote 1000mg  BID -Continue clonazepam 0.5mg  BID - Continue LTM  - Neurology will continue to follow    Gevena Mart DNP, ACNPC-AG  [facility]  Attending Neurohospitalist Addendum Patient seen and examined with APP/Resident. Agree with the history and physical as documented above. Agree with the plan as documented, which I helped formulate. I have independently reviewed the chart, obtained history, review of systems and examined the patient.I have personally reviewed pertinent head/neck/spine imaging (CT/MRI). Sedated exam. Changes to medications including drips made as above Plan discussed in detail with the patient's sister at bedside Plan also discussed in detail with Dr. Merrily Pew and Dr. Wynetta Emery Please feel free to call with any questions.  -- Milon Dikes, MD Neurologist [facility] Pager: [phone]    CRITICAL CARE ATTESTATION Performed by: Milon Dikes, MD Total critical care time: 48 minutes Critical care time was exclusive of separately billable procedures and treating other patients and/or supervising APPs/Residents/Students Critical care was necessary to treat or prevent imminent or life-threatening deterioration. This  patient is critically ill and at significant risk for neurological worsening and/or death and care requires constant monitoring. Critical care was time spent personally by me on the following activities: development of treatment plan with patient and/or surrogate as well as nursing, discussions with consultants, evaluation of patient's response to treatment, examination of patient, obtaining history from patient or surrogate, ordering and performing treatments and interventions, ordering and review of laboratory studies, ordering and review of radiographic studies, pulse oximetry, re-evaluation of patient's condition, participation in multidisciplinary rounds and medical decision making of high complexity in the care of this patient.

## 2022-05-28 NOTE — Progress Notes (Signed)
Subjective: Patient reports remains intubated  Objective: Vital signs in last 24 hours: Temp:  [96.4 F (35.8 C)-98.6 F (37 C)] 98.2 F (36.8 C) (05/04 0400) Pulse Rate:  [81-130] 107 (05/04 0800) Resp:  [0-23] 20 (05/04 0800) BP: (95-140)/(62-83) 102/68 (05/04 0800) SpO2:  [96 %-100 %] 100 % (05/04 0807) Arterial Line BP: (95-153)/(58-95) 95/60 (05/04 0800) FiO2 (%):  [40 %-50 %] 40 % (05/04 0807)  Intake/Output from previous day: 05/03 0701 - 05/04 0700 In: 4013.1 [I.V.:859.3; Blood:707.5; NG/GT:1185.3; IV Piggyback:1261.1] Out: 1585 [Urine:1585] Intake/Output this shift: No intake/output data recorded.  Pupils are equal minimal response to noxious stimuli no change in exam  Lab Results: Recent Labs    05/27/22 1620 05/28/22 0635  WBC 3.7* 5.1  HGB 9.7* 10.2*  HCT 28.1* 29.4*  PLT 46* 51*   BMET Recent Labs    05/27/22 0546 05/27/22 0735 05/28/22 0635  NA 145 147* 144  K 3.6 3.5 3.4*  CL 115*  --  112*  CO2 23  --  23  GLUCOSE 184*  --  141*  BUN 18  --  18  CREATININE 0.88  --  0.77  CALCIUM 7.3*  --  7.6*    Studies/Results: CT HEAD WO CONTRAST ( )  Result Date: 05/27/2022 CLINICAL DATA:  Stroke follow-up EXAM: CT HEAD WITHOUT CONTRAST TECHNIQUE: Contiguous axial images were obtained from the base of the skull through the vertex without intravenous contrast. RADIATION DOSE REDUCTION: This exam was performed according to the departmental dose-optimization program which includes automated exposure control, adjustment of the mA and/or kV according to patient size and/or use of iterative reconstruction technique. COMPARISON:  CT head 05/13/2022 FINDINGS: Brain: Redemonstrated are postsurgical changes from a decompressive right hemicraniectomy. There is a left frontal approach ventriculostomy catheter with the tip terminating at the margin of the left lateral ventricle. The shunt catheter appears slightly retracted compared to prior exam dated 05/11/2022. There is  interval decrease in size of the ventricular system compared to prior exam. There is persistent, but decreased pneumocephalus. There is interval increase in the volume of extra-axial fluid around the right cerebral convexity, as well as along the falx. Previously, the parafalcine component measured up to 4 mm, now measures up to 7 mm, when measured in a similar location. There is a hematocrit level along the posterior falx (series 3, image 23). There is an epidural hematoma along the right cerebral convexity (immediately adjacent to the craniotomy flap) that measures up to 1.4 cm (series 3, image 22). There is also a subdural hematoma along the right frontoparietal convexity that measures up to 2.9 cm with a hematocrit level. Subdural hematoma along the left cerebral convexity is grossly unchanged measuring up to 3 mm. There is interval increase in the degree of leftward midline shift. There was previously 1.7 cm rightward midline shift now there is a proximally 6 mm rightward midline shift. Vascular: No hyperdense vessel or unexpected calcification. Skull: Status post right hemicraniectomy. Compared to prior exam there is interval increase in soft tissue swelling/focal soft tissue thickening along the right frontal scalp (series 5, image 48). Sinuses/Orbits: Trace right mastoid effusion. No middle ear effusion. Paranasal sinuses are notable for an air-fluid level in the right sphenoid sinus. Orbits are unremarkable. Other: None. IMPRESSION: 1. Interval increase in the volume of extra-axial fluid around the right cerebral convexity, as well as along the falx, with a hematocrit level along the posterior falx. There is likely both an epidural and a subdural hematoma along the  right cerebral convexity. The epidural hematoma abutting the craniotomy flap measures up to 1.4 cm and is new from prior. The subdural hematoma is seen along the falx and abutting the site of presumed epidural hematoma measures up to 2.9 cm with  a hematocrit level. Interval increase in the degree of leftward midline shift (decreased rightward midline shift). 2. Unchanged subdural hematoma along the left cerebral convexity measuring up to 3 mm. 3. Left frontal approach ventriculostomy catheter with the tip terminating at the margin of the left lateral ventricle. The ventriculostomy catheter appears slightly retracted compared to prior exam. Interval decrease in size of the ventricular system compared to prior exam. 4. Interval increase in soft tissue swelling/focal soft tissue thickening along the right frontal scalp with an associated locule of gas. Correlate with physical exam to exclude the possibility of infection. These results will be called to the ordering clinician or representative by the Radiologist Assistant, and communication documented in the PACS or [employer]. Electronically Signed   By: Lorenza Cambridge M.D.   On: 05/27/2022 10:22   DG CHEST PORT 1 VIEW  Result Date: 05/26/2022 CLINICAL DATA:  Acute hypercapnic respiratory failure EXAM: PORTABLE CHEST 1 VIEW COMPARISON:  Portable exam 1654 hours compared to 05/25/2022 FINDINGS: Tip of endotracheal tube projects 4.7 cm above carina. Nasogastric tube coiled in proximal stomach. RIGHT jugular central venous catheter tip projects over SVC. Normal heart size, mediastinal contours, and pulmonary vascularity. Bibasilar atelectasis greater on LEFT. Remaining lungs clear. No pleural effusion or pneumothorax. IMPRESSION: Mild bibasilar atelectasis greater on LEFT. Electronically Signed   By: Ulyses Southward M.D.   On: 05/26/2022 18:00   Overnight EEG with video  Result Date: 05/26/2022 Charlsie Quest, MD     05/26/2022 10:47 AM Patient Name: JAKORY SPEDDING MRN: [id number] Epilepsy Attending: Charlsie Quest Referring Physician/Provider: Cheri Fowler, MD Duration: 5/1/02024 1010 to 05/26/2022 1010 Patient history: 52 year old male with hypertension, hyperlipidemia and prior right MCA stroke,  complicated with malignant cerebral edema requiring decompressive craniectomy, underwent cranioplasty and VP shunt, who presented with increasing headache, noted to have cranioplasty failure, today he underwent cranioplasty again, upon arrival to ICU patient was not responding, he had forced left gaze deviation, he became hypotensive, hypoxic and started gurgling. EEG to evaluate for seizure Level of alertness:  lethargic /sedated AEDs during EEG study: LCM, TPM, VPA, LEV, Versed Technical aspects: This EEG study was done with scalp electrodes positioned according to the 10-20 International system of electrode placement. Electrical activity was reviewed with band pass filter of 1-70Hz , sensitivity of 7 uV/mm, display speed of 35mm/sec with a 60Hz  notched filter applied as appropriate. EEG data were recorded continuously and digitally stored.  Video monitoring was available and reviewed as appropriate. Description: No clear posterior dominant rhythm was seen. Sleep was characterized by sleep spindles (12 to 14 Hz), maximal frontocentral region. EEG showed continuous generalized and lateralized right hemisphere low amplitude 2-3hz  delta slowing. Hyperventilation and photic stimulation were not performed.   ABNORMALITY - Continuous slow, generalized and lateralized right hemisphere IMPRESSION: This study is suggestive of cortical dysfunction arising from right hemisphere likely secondary to underlying structural abnormality. Additionally there is severe diffuse encephalopathy, nonspecific etiology but could be secondary to sedation. No seizures or epileptiform discharges were seen throughout the recording. Priyanka Annabelle Harman    Assessment/Plan: Postop day 4 for cranioplasty patient remains with minimal exam repeat CT yesterday complicated picture fluid underneath the flap and intradural and extradural however no significant right to left  mass effect there is some left-to-right mass effect but this I think is a  pressure gradient nothing I think to do differently continue to ride it out appreciate neurology's help  LOS: 4 days     Mariam Dollar 05/28/2022, 8:48 AM

## 2022-05-29 DIAGNOSIS — Z9889 Other specified postprocedural states: Secondary | ICD-10-CM | POA: Diagnosis not present

## 2022-05-29 DIAGNOSIS — M952 Other acquired deformity of head: Secondary | ICD-10-CM | POA: Diagnosis not present

## 2022-05-29 DIAGNOSIS — R569 Unspecified convulsions: Secondary | ICD-10-CM | POA: Diagnosis not present

## 2022-05-29 DIAGNOSIS — R579 Shock, unspecified: Secondary | ICD-10-CM | POA: Diagnosis not present

## 2022-05-29 DIAGNOSIS — G40901 Epilepsy, unspecified, not intractable, with status epilepticus: Secondary | ICD-10-CM | POA: Diagnosis not present

## 2022-05-29 LAB — VANCOMYCIN, PEAK: Vancomycin Pk: 39 ug/mL (ref 30–40)

## 2022-05-29 LAB — CBC
HCT: 29.1 % — ABNORMAL LOW (ref 39.0–52.0)
Hemoglobin: 9.7 g/dL — ABNORMAL LOW (ref 13.0–17.0)
MCH: 30.8 pg (ref 26.0–34.0)
MCHC: 33.3 g/dL (ref 30.0–36.0)
MCV: 92.4 fL (ref 80.0–100.0)
Platelets: 61 10*3/uL — ABNORMAL LOW (ref 150–400)
RBC: 3.15 MIL/uL — ABNORMAL LOW (ref 4.22–5.81)
RDW: 15.7 % — ABNORMAL HIGH (ref 11.5–15.5)
WBC: 5.2 10*3/uL (ref 4.0–10.5)
nRBC: 0.4 % — ABNORMAL HIGH (ref 0.0–0.2)

## 2022-05-29 LAB — PROTIME-INR
INR: 1.1 (ref 0.8–1.2)
Prothrombin Time: 13.9 seconds (ref 11.4–15.2)

## 2022-05-29 LAB — VANCOMYCIN, TROUGH: Vancomycin Tr: 15 ug/mL (ref 15–20)

## 2022-05-29 LAB — APTT: aPTT: 42 seconds — ABNORMAL HIGH (ref 24–36)

## 2022-05-29 LAB — GLUCOSE, CAPILLARY
Glucose-Capillary: 170 mg/dL — ABNORMAL HIGH (ref 70–99)
Glucose-Capillary: 167 mg/dL — ABNORMAL HIGH (ref 70–99)
Glucose-Capillary: 274 mg/dL — ABNORMAL HIGH (ref 70–99)
Glucose-Capillary: 192 mg/dL — ABNORMAL HIGH (ref 70–99)
Glucose-Capillary: 228 mg/dL — ABNORMAL HIGH (ref 70–99)

## 2022-05-29 LAB — VALPROIC ACID LEVEL: Valproic Acid Lvl: 62 ug/mL (ref 50.0–100.0)

## 2022-05-29 LAB — TRIGLYCERIDES: Triglycerides: 133 mg/dL (ref <150)

## 2022-05-29 MED ORDER — ASCORBIC ACID 500 MG/5ML PO LIQD
500.0000 mg | Freq: Once | ORAL | Status: AC
  Administered 2022-05-29: 500 mg
  Filled 2022-05-29: qty 5

## 2022-05-29 MED ORDER — LEVETIRACETAM 750 MG PO TABS
1500.0000 mg | ORAL_TABLET | Freq: Two times a day (BID) | ORAL | Status: DC
  Administered 2022-05-29 – 2022-05-30 (×2): 1500 mg
  Filled 2022-05-29 (×2): qty 2

## 2022-05-29 MED ORDER — ACETAMINOPHEN 650 MG RE SUPP: 650.0000 mg | RECTAL | Status: DC | PRN

## 2022-05-29 MED ORDER — ACETAMINOPHEN 325 MG PO TABS
650.0000 mg | ORAL_TABLET | ORAL | Status: DC | PRN
  Administered 2022-05-29 – 2022-06-07 (×8): 650 mg
  Filled 2022-05-29 (×7): qty 2

## 2022-05-29 MED ORDER — FENTANYL CITRATE PF 50 MCG/ML IJ SOSY
50.0000 ug | PREFILLED_SYRINGE | INTRAMUSCULAR | Status: DC | PRN
  Administered 2022-05-29 – 2022-06-03 (×22): 50 ug via INTRAVENOUS
  Filled 2022-05-29 (×23): qty 1

## 2022-05-29 MED ORDER — VANCOMYCIN HCL IN DEXTROSE 1-5 GM/200ML-% IV SOLN
1000.0000 mg | Freq: Two times a day (BID) | INTRAVENOUS | Status: AC
  Administered 2022-05-30 (×2): 1000 mg via INTRAVENOUS
  Filled 2022-05-29 (×2): qty 200

## 2022-05-29 NOTE — Progress Notes (Signed)
Subjective: On burst suppression  Objective: Vital signs in last 24 hours: Temp:  [97.6 F (36.4 C)-98.6 F (37 C)] 98.6 F (37 C) (05/05 0800) Pulse Rate:  [82-145] 139 (05/05 1000) Resp:  [18-22] 20 (05/05 1000) BP: (97-134)/(63-101) 111/79 (05/05 1000) SpO2:  [91 %-100 %] 91 % (05/05 1000) Arterial Line BP: (98-139)/(59-95) 123/81 (05/05 1000) FiO2 (%):  [40 %] 40 % (05/05 0817)  Intake/Output from previous day: 05/04 0701 - 05/05 0700 In: 5300.1 [I.V.:2614.8; NG/GT:1375; IV Piggyback:1310.3] Out: 3420 [Urine:3420] Intake/Output this shift: Total I/O In: 402.6 [I.V.:247.4; NG/GT:55; IV Piggyback:100.2] Out: 175 [Urine:175]  On propofol and Versed gtts, burst suppresion No movement to stimulus Pupils 2 mm, sluggishly reactive bilaterally Incision c/d  Lab Results: Recent Labs    05/28/22 0635 05/29/22 0236  WBC 5.1 5.2  HGB 10.2* 9.7*  HCT 29.4* 29.1*  PLT 51* 61*   BMET Recent Labs    05/27/22 0546 05/27/22 0735 05/28/22 0635  NA 145 147* 144  K 3.6 3.5 3.4*  CL 115*  --  112*  CO2 23  --  23  GLUCOSE 184*  --  141*  BUN 18  --  18  CREATININE 0.88  --  0.77  CALCIUM 7.3*  --  7.6*    Studies/Results: No results found.  Assessment/Plan: S/p revision cranioplasty  LOS: 5 days  -cont EEG, burst suppression - repeat CT head in am   Patrick Franklin 05/29/2022, 10:17 AM

## 2022-05-29 NOTE — Progress Notes (Signed)
At 0000 this RN noticed patient had excessive bleeding from CBG sugar check.  at 0030 RT noticed patient had excessive bleeding from gums (concern for DIC?).  CCM notified.  Orders received to check coags and CBC.  No other changes noted and VSS

## 2022-05-29 NOTE — Progress Notes (Signed)
LTM maint complete - no skin breakdown uner: Fp1 Fp2 F3 [facility] monitored, Event button test confirmed by [facility].

## 2022-05-29 NOTE — Progress Notes (Signed)
Pharmacy Antibiotic Note  Patrick Franklin is a 52 y.o. male admitted on 05/23/2022 with sepsis.  Pharmacy has been consulted for Vancomycin dosing. Pt also on Unasyn.  Vancomycin peak level: 39 mcg/mL Vancomycin trough level: 15 mcg/mL Calculated AUC: 640  WBC 5.2, Tmax 99.1 SCr 0.77, stable from 5/4  Plan: Decrease Vancomycin to 1000mg  IV q12h (eAUC ~511) through 5/6     > Goal AUC 400-550 Continue Unasyn 3g IV q6h through 5/8  Monitor daily CBC, temp, SCr, and for clinical signs of improvement   Height: 5\' 10"  (177.8 cm) Weight: 89.2 kg (196 lb 10.4 oz) IBW/kg (Calculated) : 73  Temp (24hrs), Avg:98.3 F (36.8 C), Min:97.6 F (36.4 C), Max:99.1 F (37.3 C)  Recent Labs  Lab 05/23/22 0954 05/16/2022 1704 05/25/22 0500 05/25/22 1021 05/25/22 1301 05/25/22 2209 05/26/22 0201 05/26/22 0508 05/26/22 1627 05/26/22 1834 05/27/22 0546 05/27/22 1620 05/28/22 0635 05/29/22 0236 05/29/22 0237 05/29/22 1118  WBC 4.9   < > 16.9*  --   --   --  10.5  --   --   --   --  3.7* 5.1 5.2  --   --   CREATININE 0.89  --  1.30*  --   --  0.99  --   --   --   --  0.88  --  0.77  --   --   --   LATICACIDVEN  --    < >  --    < > 2.9*  --  2.6* 2.8* 3.7* 3.5*  --   --   --   --   --   --   VANCOTROUGH  --   --   --   --   --   --   --   --   --   --   --   --   --   --   --  15  VANCOPEAK  --   --   --   --   --   --   --   --   --   --   --   --   --   --  39  --    < > = values in this interval not displayed.     Estimated Creatinine Clearance: 124.2 mL/min (by C-G formula based on SCr of 0.77 mg/dL).    No Active Allergies  Antimicrobials this admission: 4/30 Cefazolin >> 5/1 5/2 Zosyn x 1 5/2 Vanc >> (5/6) 5/2 Unasyn >> (5/8)  Microbiology results: 5/1 Trach asp: normal respiratory flora  Thank you for allowing pharmacy to be a part of this patient's care.  Wilburn Cornelia, PharmD, BCPS Clinical Pharmacist 05/29/2022 1:40 PM   Please refer to [facility] for pharmacy phone  number

## 2022-05-29 NOTE — Progress Notes (Signed)
eLink Physician-Brief Progress Note Patient Name: VERN FITHEN DOB: December 02, 1970 MRN: [id number]   Date of Service  05/29/2022  HPI/Events of Note  Nurse notes bleeding from puncture sites and bloody resp secretions with suctioning.  eICU Interventions  Check coags Follow hgb and plt count     Intervention Category Intermediate Interventions: Other:  Henry Russel, P 05/29/2022, 12:57 AM

## 2022-05-29 NOTE — Procedures (Signed)
Patient Name: JAHSIAH SIEGLER  MRN: [id number]  Epilepsy Attending: Charlsie Quest  Referring Physician/Provider: Cheri Fowler, MD  Duration: 05/28/2022 1100 to 05/29/2022 1100   Patient history: 52 year old male with hypertension, hyperlipidemia and prior right MCA stroke, complicated with malignant cerebral edema requiring decompressive craniectomy, underwent cranioplasty and VP shunt, who presented with increasing headache, noted to have cranioplasty failure, today he underwent cranioplasty again, upon arrival to ICU patient was not responding, he had forced left gaze deviation, he became hypotensive, hypoxic and started gurgling. EEG to evaluate for seizure   Level of alertness:  lethargic /sedated   AEDs during EEG study: LCM, TPM, VPA, LEV, Clonazepam, propofol, versed   Technical aspects: This EEG study was done with scalp electrodes positioned according to the 10-20 International system of electrode placement. Electrical activity was reviewed with band pass filter of 1-70Hz , sensitivity of 7 uV/mm, display speed of 19mm/sec with a 60Hz  notched filter applied as appropriate. EEG data were recorded continuously and digitally stored.  Video monitoring was available and reviewed as appropriate.   Description: EEG showed bursts suppression pattern with bursts of generalized 3-6hz  theta-delta slowing admixed with polyspikes in left occipital region lasting 1-4 seconds alternating with 3-5 seconds of generalized eeg suppression   ABNORMALITY - Burst suppression, generalized - Polyspikes, left occipital region    IMPRESSION: This study showed evidence of epileptogenicity arising from left occipital region. Additionally there was profound diffuse encephalopathy, likely related to sedation.  No seizures were noted.   Venida Tsukamoto Annabelle Harman

## 2022-05-29 NOTE — Progress Notes (Signed)
Neurology Progress Note   S:// Family is at the bedside.  RN at the bedside.Dr. Garnet Sierras at the bedside. He is tahy in the HR 140's.  LTM with burst suppression and poly spikes. Will not adjust medications today, continue to allow for burst suppression. He is on 10mg  IV Versed, and IV propofol  Continue LTM.   O:// Current vital signs: BP 120/89   Pulse (!) 141   Temp 98.6 F (37 C) (Axillary)   Resp 20   Ht 5\' 10"  (1.778 m)   Wt 89.2 kg   SpO2 100%   BMI 28.22 kg/m  Vital signs in last 24 hours: Temp:  [97.6 F (36.4 C)-98.6 F (37 C)] 98.6 F (37 C) (05/05 0800) Pulse Rate:  [82-145] 141 (05/05 0800) Resp:  [18-22] 20 (05/05 0800) BP: (97-134)/(63-101) 120/89 (05/05 0800) SpO2:  [99 %-100 %] 100 % (05/05 0817) Arterial Line BP: (93-139)/(59-95) 129/90 (05/05 0800) FiO2 (%):  [40 %] 40 % (05/05 0817)  GENERAL: Critically ill, male sedated and intubated no apparent distress HEENT: - Normocephalic and atraumatic, dry mm, ET tube, OG tube LUNGS - Clear to auscultation bilaterally with no wheezes CV - S1S2 RRR, no m/r/g, equal pulses bilaterally. ABDOMEN - Soft, nontender, nondistended with normoactive BS Ext: warm, well perfused, intact peripheral pulses, 3+ edema in all extremities  NEURO:  Mental Status: AA&Ox Intubated and sedated on propofol.  Eyes are closed, does not open eyes to voice or painful stimuli.  He does not follow commands and minimal response to painful stimuli.  PERRL-1 mm and sluggish.  On eye-opening eyes are disconjugate, and at times a rightward gaze, roving at times, with nystagmus, eyes do return to midline, he does not track.  No  corneal reflexes bilaterally, no cough no gag. No response to noxious stimuli in all 4 extremities.     Medications  Current Facility-Administered Medications:    0.9 %  sodium chloride infusion, , Intravenous, PRN, Cheri Fowler, MD, Last Rate: 10 mL/hr at 05/26/22 1236, New Bag at 05/26/22 1236   acetaminophen  (TYLENOL) tablet 650 mg, 650 mg, Oral, Q4H PRN, 650 mg at 05/26/22 2330 **OR** acetaminophen (TYLENOL) suppository 650 mg, 650 mg, Rectal, Q4H PRN, Julio Sicks, MD, 650 mg at 05/26/22 0118   Ampicillin-Sulbactam (UNASYN) 3 g in sodium chloride 0.9 % 100 mL IVPB, 3 g, Intravenous, Q6H, Chand, Sudham, MD, Stopped at 05/29/22 0827   ascorbic acid (VITAMIN C) liquid 500 mg, 500 mg, Per Tube, Once, Chand, Garnet Sierras, MD   atorvastatin (LIPITOR) tablet 80 mg, 80 mg, Per Tube, QHS, Pool, Sherilyn Cooter, MD, 80 mg at 05/28/22 2105   bisacodyl (DULCOLAX) suppository 10 mg, 10 mg, Rectal, Daily PRN, Julio Sicks, MD   Chlorhexidine Gluconate Cloth 2 % PADS 6 each, 6 each, Topical, Daily, Pool, Sherilyn Cooter, MD, 6 each at 05/28/22 2134   clonazePAM (KLONOPIN) tablet 0.5 mg, 0.5 mg, Per Tube, BID, Gevena Mart A, NP, 0.5 mg at 05/29/22 0904   feeding supplement (OSMOLITE 1.5 CAL) liquid 1,000 mL, 1,000 mL, Per Tube, Continuous, Chand, Garnet Sierras, MD, Last Rate: 55 mL/hr at 05/29/22 0904, 1,000 mL at 05/29/22 0904   feeding supplement (PROSource TF20) liquid 60 mL, 60 mL, Per Tube, BID, Chand, Sudham, MD, 60 mL at 05/29/22 0904   fentaNYL (SUBLIMAZE) injection 50 mcg, 50 mcg, Intravenous, Q3H PRN, Cheri Fowler, MD, 50 mcg at 05/29/22 0903   fentaNYL in NS (51mcg/ml) infusion-PREMIX, 50-200 mcg/hr, Intravenous, Continuous, Cheri Fowler, MD, Stopped at 05/27/22  1610   HYDROcodone-acetaminophen (NORCO/VICODIN) 5-325 MG per tablet 1 tablet, 1 tablet, Oral, Q4H PRN, Julio Sicks, MD, 1 tablet at 05/25/22 2242   insulin aspart (novoLOG) injection 0-15 Units, 0-15 Units, Subcutaneous, Q4H, Chand, Garnet Sierras, MD, 3 Units at 05/29/22 0755   ipratropium-albuterol (DUONEB) 0.5-2.5 (3) MG/3ML nebulizer solution 3 mL, 3 mL, Nebulization, Q4H PRN, Julio Sicks, MD   lacosamide (VIMPAT) tablet 200 mg, 200 mg, Per Tube, BID, Pool, Sherilyn Cooter, MD, 200 mg at 05/28/22 2105   lactated ringers bolus 1,000 mL, 1,000 mL, Intravenous, Once, Duayne Cal, NP   lactated ringers infusion, , Intravenous, Continuous, [place], Garnet Sierras, MD, Last Rate: 100 mL/hr at 05/29/22 0931, New Bag at 05/29/22 0931   levETIRAcetam (KEPPRA) IVPB 1500 mg/ 100 mL premix, 1,500 mg, Intravenous, Q12H, Milon Dikes, MD, Stopped at 05/29/22 9604   LORazepam (ATIVAN) injection 1-2 mg, 1-2 mg, Intravenous, Q5 min PRN, Migdalia Dk, MD, 2 mg at 05/16/2022 2156   menthol-cetylpyridinium (CEPACOL) lozenge 3 mg, 1 lozenge, Oral, Q2H PRN, Julio Sicks, MD   midazolam (VERSED) 100 mg/100 mL (1 mg/mL) premix infusion, 10 mg/hr, Intravenous, Continuous, Milon Dikes, MD, Last Rate: 10 mL/hr at 05/29/22 0900, 10 mg/hr at 05/29/22 0900   multivitamin with minerals tablet 1 tablet, 1 tablet, Per Tube, Daily, Julio Sicks, MD, 1 tablet at 05/29/22 0904   norepinephrine (LEVOPHED) 4mg  in (0.016 mg/mL) premix infusion, 0-40 mcg/min, Intravenous, Titrated, Paliwal, Aditya, MD, Stopped at 05/28/22 0402   ondansetron (ZOFRAN) tablet 4 mg, 4 mg, Oral, Q4H PRN **OR** ondansetron (ZOFRAN) injection 4 mg, 4 mg, Intravenous, Q4H PRN, Julio Sicks, MD   Oral care mouth rinse, 15 mL, Mouth Rinse, Q2H, Pool, Sherilyn Cooter, MD, 15 mL at 05/29/22 0757   Oral care mouth rinse, 15 mL, Mouth Rinse, PRN, Julio Sicks, MD   pantoprazole (PROTONIX) injection 40 mg, 40 mg, Intravenous, QHS, Pool, Sherilyn Cooter, MD, 40 mg at 05/28/22 2105   polyethylene glycol (MIRALAX / GLYCOLAX) packet 17 g, 17 g, Per Tube, BID, Pool, Sherilyn Cooter, MD, 17 g at 05/27/22 2218   promethazine (PHENERGAN) tablet 12.5-25 mg, 12.5-25 mg, Oral, Q4H PRN, Julio Sicks, MD   propofol (DIPRIVAN) 1000 MG/100ML infusion, 5-80 mcg/kg/min, Intravenous, Titrated, Milon Dikes, MD, Last Rate: 21.4 mL/hr at 05/29/22 0934, 40 mcg/kg/min at 05/29/22 0934   sennosides (SENOKOT) 8.8 MG/5ML syrup 8.8 mg, 8.8 mg, Per Tube, Q12H PRN, Francena Hanly, RPH, 8.8 mg at 05/27/22 1435   sertraline (ZOLOFT) tablet 100 mg, 100 mg, Per Tube, Daily, Pool, Sherilyn Cooter, MD, 100 mg at  05/29/22 0904   topiramate (TOPAMAX) tablet 100 mg, 100 mg, Per Tube, Daily, Milon Dikes, MD, 100 mg at 05/29/22 0904   valproic acid (DEPAKENE) 250 MG/5ML solution 1,000 mg, 1,000 mg, Per Tube, Q12H, Francena Hanly, RPH, 1,000 mg at 05/29/22 0319   vancomycin (VANCOREADY) IVPB 1250 mg/250 mL, 1,250 mg, Intravenous, Q12H, Titus Mould, RPH, Stopped at 05/29/22 0128 Labs CBC    Component Value Date/Time   WBC 5.2 05/29/2022 0236   RBC 3.15 (L) 05/29/2022 0236   HGB 9.7 (L) 05/29/2022 0236   HCT 29.1 (L) 05/29/2022 0236   PLT 61 (L) 05/29/2022 0236   MCV 92.4 05/29/2022 0236   MCH 30.8 05/29/2022 0236   MCHC 33.3 05/29/2022 0236   RDW 15.7 (H) 05/29/2022 0236   LYMPHSABS 1.3 05/28/2022 0635   MONOABS 0.6 05/28/2022 0635   EOSABS 0.2 05/28/2022 0635   BASOSABS 0.0 05/28/2022 0635    CMP  Component Value Date/Time   NA 144 05/28/2022 0635   K 3.4 (L) 05/28/2022 0635   CL 112 (H) 05/28/2022 0635   CO2 23 05/28/2022 0635   GLUCOSE 141 (H) 05/28/2022 0635   BUN 18 05/28/2022 0635   CREATININE 0.77 05/28/2022 0635   CALCIUM 7.6 (L) 05/28/2022 0635   PROT 4.0 (L) 05/28/2022 0635   ALBUMIN 1.8 (L) 05/28/2022 0635   AST 19 05/28/2022 0635   ALT 11 05/28/2022 0635   ALKPHOS 55 05/28/2022 0635   BILITOT 0.5 05/28/2022 0635   GFRNONAA >60 05/28/2022 0635   GFRAA >60 10/19/2019 1558     Lipid Panel     Component Value Date/Time   CHOL 117 10/02/2018 0333   TRIG 133 05/29/2022 0237   HDL 32 (L) 10/02/2018 0333   CHOLHDL 3.7 10/02/2018 0333   VLDL 26 10/02/2018 0333   LDLCALC 59 10/02/2018 0333     Imaging I have reviewed images in epic and the results pertinent to this consultation are:  CT-scan of the brain 4/30: Status post revision of the previously seen right cranioplasty with multicompartmental hemorrhage underlying the cranioplasty measuring up to approximately 2.0 cm in thickness. 2. Extensive pneumocephalus overlying both cerebral hemispheres, right  worse than left, with drains in place overlying the right cerebral hemisphere. No midline shift.   CT head 5/1:   Large amount of right hemispheric pneumocephalus status post revision of right cranioplasty. 2. Decreased amount of hemorrhage within the right hemispheric collection, now with 6 mm of rightward midline shift.   CT head 5/3: Interval increase in the volume of extra-axial fluid around the right cerebral convexity, as well as along the falx, with a hematocrit level along the posterior falx. There is likely both an epidural and a subdural hematoma along the right cerebral convexity. The epidural hematoma abutting the craniotomy flap measures up to 1.4 cm and is new from prior. The subdural hematoma is seen along the falx and abutting the site of presumed epidural hematoma measures up to 2.9 cm with a hematocrit level. Interval increase in the degree of leftward midline shift (decreased rightward midline shift). 2. Unchanged subdural hematoma along the left cerebral convexity 3. Left frontal approach ventriculostomy catheter with the tip terminating at the margin of the left lateral ventricle. The ventriculostomy catheter appears slightly retracted compared to prior exam. Interval decrease in size of the ventricular system compared to prior exam. 4. Interval increase in soft tissue swelling/focal soft tissue thickening along the right frontal scalp with an associated locule of gas. Correlate with physical exam to exclude the possibility of infection.   Labs:  VPA 72 Ammonia 38 Phos 2.0 Mg 1.9 K3.4  LTM 5/4-5/5 ABNORMALITY - Burst suppression, generalized - Polyspikes, left occipital region    IMPRESSION: This study showed evidence of epileptogenicity arising from left occipital region. Additionally there was profound diffuse encephalopathy, likely related to sedation.  No seizures were noted.  Assessment:  52 year old male admitted for cranioplasty revision on 4/30, with  decreased sedation patient with frequent seizures without clinical signs arising from left occipital region  1 night ago, overnight LTM with mild burst suppression pattern and polyspikes in the next occipital region without seizures.  Last night, good but suppression.  Will keep but suppressed for another 24 hours before starting to wean off sedation starting Monday morning.  Impression: Post op Status epilepticus   Recommendations: - Seizure precautions -Propofol and Versed for burst suppression at current doses. -Continue Keppra to 1.5mg   BID, Topamax to 100 mg daily, Depakote 1000mg  BID -Continue clonazepam 0.5mg  BID - Continue LTM  - Neurology will continue to follow   Gevena Mart DNP, ACNPC-AG  [facility]  Attending Neurohospitalist Addendum Patient seen and examined with APP/Resident. Agree with the history and physical as documented above. Agree with the plan as documented, which I helped formulate. I have independently reviewed the chart, obtained history, review of systems and examined the patient.I have personally reviewed pertinent head/neck/spine imaging (CT/MRI). It was discussed with patient's sister and daughter at bedside.  Plan also discussed with Dr. Merrily Pew Please feel free to call with any questions.  -- Milon Dikes, MD Neurologist [facility] Pager: [phone]  CRITICAL CARE ATTESTATION Performed by: Milon Dikes, MD Total critical care time: 36 minutes Critical care time was exclusive of separately billable procedures and treating other patients and/or supervising APPs/Residents/Students Critical care was necessary to treat or prevent imminent or life-threatening deterioration. This patient is critically ill and at significant risk for neurological worsening and/or death and care requires constant monitoring. Critical care was time spent personally by me on the following activities: development of treatment plan with patient and/or  surrogate as well as nursing, discussions with consultants, evaluation of patient's response to treatment, examination of patient, obtaining history from patient or surrogate, ordering and performing treatments and interventions, ordering and review of laboratory studies, ordering and review of radiographic studies, pulse oximetry, re-evaluation of patient's condition, participation in multidisciplinary rounds and medical decision making of high complexity in the care of this patient.

## 2022-05-29 NOTE — Progress Notes (Signed)
NAME:  Patrick Franklin, MRN:  [id number], DOB:  1971/01/25, LOS: 5 ADMISSION DATE:  05/15/2022, CONSULTATION DATE: 05/06/2022 REFERRING MD: Dr. Julio Sicks, CHIEF COMPLAINT: Altered mental status  History of Present Illness:  52 year old male with hypertension, hyperlipidemia and prior right MCA stroke, complicated with malignant cerebral edema requiring decompressive craniectomy, underwent cranioplasty and VP shunt, who presented with increasing headache, noted to have cranioplasty failure, today he underwent cranioplasty again, upon arrival to ICU patient was not responding, he had forced left gaze deviation, he became hypotensive, hypoxic and started gurgling.  PCCM was consulted for help evaluation medical management  Pertinent  Medical History   Past Medical History:  Diagnosis Date   Aorto-iliac atherosclerosis 08/29/2017   By CT   Asthma    Cardiomegaly 04/13/2016   Cerebral embolism with cerebral infarction 10/03/2018   DVT (deep venous thrombosis) (HCC)    Dysphagia following other cerebrovascular disease    Elevated serum creatinine    Essential hypertension 05/15/2017   GERD without esophagitis    Headache    Hemiparesis affecting left side as late effect of stroke 08/09/2017   History of ischemic right MCA stroke 2017   History of left MCA stroke 2020   History of renal stone 08/22/2017   Stone analysis - calcium stone (08/2017) 8 non obstructing L kidney by CT - will refer to urology (08/2017)   Homonymous bilateral field defects    Hyperlipidemia 05/15/2017   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   MDD (major depressive disorder) 05/15/2017   Neurogenic bladder    Presence of cerebrospinal fluid drainage device    Pulmonary embolism 01/13/2016   Seizure disorder 08/09/2017   Spastic hemiplegia affecting nondominant side 03/26/2018   Subluxation of shoulder joint 04/13/2016   left   Thrombocytopenia 10/01/2018   UTI (urinary tract infection)       Significant Hospital Events: Including procedures, antibiotic start and stop dates in addition to other pertinent events     Interim History / Subjective:  Patient became afebrile He again became tachycardic with heart rate in 140s for last few hours Came off of vasopressor support this morning Hemoglobin remained stable after total 3 units of PRBC transfusion Started oozing from gums and IV site EEG showed burst suppression  Objective   Blood pressure 120/89, pulse (!) 141, temperature 98.6 F (37 C), temperature source Axillary, resp. rate 20, height 5\' 10"  (1.778 m), weight 89.2 kg, SpO2 100 %. CVP:  [6 mmHg-13 mmHg] 11 mmHg  Vent Mode: PRVC FiO2 (%):  [40 %] 40 % Set Rate:  [20 bmp] 20 bmp Vt Set:  [580 mL] 580 mL PEEP:  [8 cmH20] 8 cmH20 Plateau Pressure:  [17 cmH20-19 cmH20] 18 cmH20   Intake/Output Summary (Last 24 hours) at 05/29/2022 0817 Last data filed at 05/29/2022 0800 Gross per 24 hour  Intake 5479.69 ml  Output 3145 ml  Net 2334.69 ml   Filed Weights   04/26/2022 0854 05/26/22 0500  Weight: 83 kg 89.2 kg    Examination:   Physical exam: General: Crtitically ill-appearing male, orally intubated HEENT: S/p cranioplasty, eyes anicteric.  ETT and OGT in place Neuro: Sedated, not following commands.  Eyes are closed.  Pupils 3 mm bilateral reactive to light Chest: Coarse breath sounds, no wheezes or rhonchi Heart: Tachycardic, regular rhythm, no murmurs or gallops Abdomen: Soft, nondistended, bowel sounds present Skin: No rash   Labs and images were reviewed  Resolved Hospital Problem list   Acute kidney injury Propofol  induced hypertriglyceridemia  Assessment & Plan:  Status epilepticus Acute encephalopathy due to recurrent seizures Patient started seizing again after stopping sedation Sedation was put back on with propofol at 40 mics and midazolam at 10 mg/h EEG showing burst suppression now Neurology is following Continue Keppra, Topamax,  Klonopin and Depakote  Ammonia level is 38 We will keep him sedated today, will try to taper off sedation tomorrow to see if there is a recurrence of seizure while he is on EEG  Acute hypoxic respiratory failure Severe sepsis with septic shock due to bilateral multifocal pneumonia Patient became afebrile Again started to get tachycardic, now heart rate in 140s He is off vasopressors Continue vancomycin to complete 5 days therapy Continue Unasyn to complete 7 days therapy Cultures remain negative Continue lung protective ventilation VAP prevention bundle in place  Lactic acidosis due to recurrent seizures and septic shock Persistent lactic acidosis, lactate went up overnight to 3.7, now creatinine back to 3.5  Prior right MCA stroke s/p craniectomy and now with cranioplasty Hydrocephalus s/p VP shunt Postop management defer to neurosurgery Shunt setting was changed by neurosurgery during this visit  Hiccups Hiccups have improved now  He has received Thorazine and Compazine Closely monitor  Acute blood loss anemia perioperatively Patient hemoglobin dropped down to 7.5 from 13 upon admission, part of this is dilutional in part due to blood loss No signs of active bleeding Hemoglobin has stabilized after 3 units of PRBCs total Monitor H&H and transfuse if less than 8  Bleeding from gums and IV puncture site Will give him vitamin C 500 mg IV x 1 Coagulation profile is normal Though he is thrombocytopenic  Thrombocytopenia critical illness Platelet count slowly improving, currently at 61 Watch for signs of bleeding  Hypokalemia/hypophosphatemia Continue aggressive electrolyte supplement and monitor  Best Practice (right click and "Reselect all SmartList Selections" daily)   Diet/type: NPO tube feeds DVT prophylaxis: SCD GI prophylaxis: PPI Lines: NA Foley:  Yes, still needed Code Status:  full code Last date of multidisciplinary goals of care discussion [5/5: Patient's  family was updated at bedside]  Labs   CBC: Recent Labs  Lab 04/28/2022 2132 05/25/22 0338 05/25/22 0500 05/25/22 1038 05/26/22 0201 05/26/22 0218 05/26/22 1830 05/27/22 0735 05/27/22 1620 05/28/22 0635 05/29/22 0236  WBC 18.9*  --  16.9*  --  10.5  --   --   --  3.7* 5.1 5.2  NEUTROABS 13.4*  --   --   --  5.0  --   --   --   --  2.9  --   HGB 13.0   < > 10.7*   < > 8.2*   < > 7.8* 6.8* 9.7* 10.2* 9.7*  HCT 40.9   < > 32.2*   < > 24.3*   < > 23.4* 20.0* 28.1* 29.4* 29.1*  MCV 96.5  --  94.2  --  92.7  --   --   --  91.5 90.5 92.4  PLT 188  --  155  --  114*  --   --   --  46* 51* 61*   < > = values in this interval not displayed.    Basic Metabolic Panel: Recent Labs  Lab 05/23/22 0954 05/23/2022 1625 05/25/22 0500 05/25/22 1038 05/25/22 1449 05/25/22 2209 05/26/22 0201 05/26/22 0218 05/26/22 1606 05/27/22 0546 05/27/22 0735 05/28/22 0635  NA 142   < > 137   < >  --  140  --  141 146* 145  147* 144  K 3.8   < > 4.6   < >  --  3.9  --  3.6 3.3* 3.6 3.5 3.4*  CL 109  --  109  --   --  111  --   --   --  115*  --  112*  CO2 26  --  16*  --   --  19*  --   --   --  23  --  23  GLUCOSE 99  --  186*  --   --  148*  --   --   --  184*  --  141*  BUN 14  --  16  --   --  15  --   --   --  18  --  18  CREATININE 0.89  --  1.30*  --   --  0.99  --   --   --  0.88  --  0.77  CALCIUM 8.7*  --  7.6*  --   --  8.1*  --   --   --  7.3*  --  7.6*  MG  --   --   --   --  1.7 1.6* 2.6*  --   --  2.0  --  1.9  PHOS  --   --   --   --  2.9  --  1.3*  --   --  2.0*  --  2.0*   < > = values in this interval not displayed.   GFR: Estimated Creatinine Clearance: 124.2 mL/min (by C-G formula based on SCr of 0.77 mg/dL). Recent Labs  Lab 05/26/22 0201 05/26/22 0508 05/26/22 1627 05/26/22 1834 05/27/22 1620 05/28/22 0635 05/29/22 0236  PROCALCITON  --   --  2.20  --   --   --   --   WBC 10.5  --   --   --  3.7* 5.1 5.2  LATICACIDVEN 2.6* 2.8* 3.7* 3.5*  --   --   --     Liver  Function Tests: Recent Labs  Lab 05/28/22 0635  AST 19  ALT 11  ALKPHOS 55  BILITOT 0.5  PROT 4.0*  ALBUMIN 1.8*   No results for input(s): "LIPASE", "AMYLASE" in the last 168 hours. Recent Labs  Lab 05/27/22 1620  AMMONIA 38*    ABG    Component Value Date/Time   PHART 7.337 (L) 05/27/2022 0735   PCO2ART 41.5 05/27/2022 0735   PO2ART 127 (H) 05/27/2022 0735   HCO3 22.2 05/27/2022 0735   TCO2 23 05/27/2022 0735   ACIDBASEDEF 3.0 (H) 05/27/2022 0735   O2SAT 99 05/27/2022 0735     Coagulation Profile: Recent Labs  Lab 05/29/22 0236  INR 1.1    Cardiac Enzymes: No results for input(s): "CKTOTAL", "CKMB", "CKMBINDEX", "TROPONINI" in the last 168 hours.  HbA1C: Hgb A1c MFr Bld  Date/Time Value Ref Range Status  10/02/2018 03:33 AM 5.2 4.8 - 5.6 % Final    Comment:    (NOTE)         Prediabetes: 5.7 - 6.4         Diabetes: >6.4         Glycemic control for adults with diabetes: <7.0     CBG: Recent Labs  Lab 05/28/22 1526 05/28/22 1934 05/28/22 2328 05/29/22 0313 05/29/22 0747  GLUCAP 206* 128* 188* 170* 167*    Critical care time:     This patient is critically ill with multiple  organ system failure which requires frequent high complexity decision making, assessment, support, evaluation, and titration of therapies. This was completed through the application of advanced monitoring technologies and extensive interpretation of multiple databases.  During this encounter critical care time was devoted to patient care services described in this note for 35 minutes.    Cheri Fowler, MD [facility] See Amion for pager If no response to pager, please call [phone] until 7pm After 7pm, Please call E-link [phone]

## 2022-05-30 ENCOUNTER — Inpatient Hospital Stay (HOSPITAL_COMMUNITY): Payer: Medicare Other

## 2022-05-30 DIAGNOSIS — R569 Unspecified convulsions: Secondary | ICD-10-CM | POA: Diagnosis not present

## 2022-05-30 DIAGNOSIS — J9601 Acute respiratory failure with hypoxia: Secondary | ICD-10-CM | POA: Diagnosis not present

## 2022-05-30 DIAGNOSIS — G40901 Epilepsy, unspecified, not intractable, with status epilepticus: Secondary | ICD-10-CM

## 2022-05-30 DIAGNOSIS — Z9889 Other specified postprocedural states: Secondary | ICD-10-CM | POA: Diagnosis not present

## 2022-05-30 LAB — CBC WITH DIFFERENTIAL/PLATELET
Abs Immature Granulocytes: 0.64 10*3/uL — ABNORMAL HIGH (ref 0.00–0.07)
Basophils Absolute: 0.1 10*3/uL (ref 0.0–0.1)
Basophils Relative: 1 %
Eosinophils Absolute: 0.5 10*3/uL (ref 0.0–0.5)
Eosinophils Relative: 7 %
HCT: 29.7 % — ABNORMAL LOW (ref 39.0–52.0)
Hemoglobin: 9.9 g/dL — ABNORMAL LOW (ref 13.0–17.0)
Immature Granulocytes: 10 %
Lymphocytes Relative: 18 %
Lymphs Abs: 1.1 10*3/uL (ref 0.7–4.0)
MCH: 31.3 pg (ref 26.0–34.0)
MCHC: 33.3 g/dL (ref 30.0–36.0)
MCV: 94 fL (ref 80.0–100.0)
Monocytes Absolute: 1.5 10*3/uL — ABNORMAL HIGH (ref 0.1–1.0)
Monocytes Relative: 23 %
Neutro Abs: 2.5 10*3/uL (ref 1.7–7.7)
Neutrophils Relative %: 41 %
Platelets: 79 10*3/uL — ABNORMAL LOW (ref 150–400)
RBC: 3.16 MIL/uL — ABNORMAL LOW (ref 4.22–5.81)
RDW: 15.4 % (ref 11.5–15.5)
Smear Review: NORMAL
WBC: 6.3 10*3/uL (ref 4.0–10.5)
nRBC: 1.1 % — ABNORMAL HIGH (ref 0.0–0.2)

## 2022-05-30 LAB — GLUCOSE, CAPILLARY
Glucose-Capillary: 157 mg/dL — ABNORMAL HIGH (ref 70–99)
Glucose-Capillary: 165 mg/dL — ABNORMAL HIGH (ref 70–99)
Glucose-Capillary: 166 mg/dL — ABNORMAL HIGH (ref 70–99)
Glucose-Capillary: 188 mg/dL — ABNORMAL HIGH (ref 70–99)
Glucose-Capillary: 202 mg/dL — ABNORMAL HIGH (ref 70–99)
Glucose-Capillary: 205 mg/dL — ABNORMAL HIGH (ref 70–99)
Glucose-Capillary: 207 mg/dL — ABNORMAL HIGH (ref 70–99)

## 2022-05-30 LAB — BASIC METABOLIC PANEL
Anion gap: 9 (ref 5–15)
BUN: 15 mg/dL (ref 6–20)
CO2: 23 mmol/L (ref 22–32)
Calcium: 7.3 mg/dL — ABNORMAL LOW (ref 8.9–10.3)
Chloride: 113 mmol/L — ABNORMAL HIGH (ref 98–111)
Creatinine, Ser: 0.76 mg/dL (ref 0.61–1.24)
GFR, Estimated: >60 mL/min (ref >60)
Glucose, Bld: 202 mg/dL — ABNORMAL HIGH (ref 70–99)
Potassium: 3.8 mmol/L (ref 3.5–5.1)
Sodium: 145 mmol/L (ref 135–145)

## 2022-05-30 LAB — PHOSPHORUS: Phosphorus: 3.9 mg/dL (ref 2.5–4.6)

## 2022-05-30 LAB — MAGNESIUM: Magnesium: 1.9 mg/dL (ref 1.7–2.4)

## 2022-05-30 MED ORDER — PERAMPANEL 2 MG PO TABS
8.0000 mg | ORAL_TABLET | Freq: Once | ORAL | Status: AC
  Administered 2022-05-30: 8 mg
  Filled 2022-05-30: qty 4

## 2022-05-30 MED ORDER — LEVETIRACETAM 750 MG PO TABS
2000.0000 mg | ORAL_TABLET | Freq: Two times a day (BID) | ORAL | Status: DC
  Administered 2022-05-30 – 2022-06-07 (×16): 2000 mg
  Filled 2022-05-30 (×17): qty 1

## 2022-05-30 MED ORDER — INSULIN GLARGINE-YFGN 100 UNIT/ML ~~LOC~~ SOLN
5.0000 [IU] | Freq: Every day | SUBCUTANEOUS | Status: DC
  Administered 2022-05-30: 5 [IU] via SUBCUTANEOUS
  Filled 2022-05-30 (×3): qty 0.05

## 2022-05-30 MED ORDER — FUROSEMIDE 10 MG/ML IJ SOLN
40.0000 mg | Freq: Every day | INTRAMUSCULAR | Status: DC
  Administered 2022-05-30: 40 mg via INTRAVENOUS
  Filled 2022-05-30: qty 4

## 2022-05-30 MED ORDER — PERAMPANEL 2 MG PO TABS: 8.0000 mg | ORAL_TABLET | Freq: Once | ORAL | Status: DC

## 2022-05-30 MED ORDER — FUROSEMIDE 10 MG/ML IJ SOLN: 40.0000 mg | Freq: Once | INTRAMUSCULAR | Status: DC

## 2022-05-30 MED ORDER — HEPARIN SODIUM (PORCINE) 5000 UNIT/ML IJ SOLN
5000.0000 [IU] | Freq: Three times a day (TID) | INTRAMUSCULAR | Status: DC
  Administered 2022-05-30 – 2022-05-31 (×3): 5000 [IU] via SUBCUTANEOUS
  Filled 2022-05-30 (×3): qty 1

## 2022-05-30 MED ORDER — CLONAZEPAM 0.5 MG PO TABS
0.5000 mg | ORAL_TABLET | Freq: Three times a day (TID) | ORAL | Status: DC
  Administered 2022-05-30 – 2022-06-06 (×23): 0.5 mg
  Filled 2022-05-30 (×23): qty 1

## 2022-05-30 NOTE — Progress Notes (Addendum)
NAME:  Patrick Franklin, MRN:  [id number], DOB:  Nov 12, 1970, LOS: 6 ADMISSION DATE:  05/03/2022, CONSULTATION DATE: 05/03/2022 REFERRING MD: Dr. Julio Sicks, CHIEF COMPLAINT: Altered mental status  History of Present Illness:  52 year old male with hypertension, hyperlipidemia and prior right MCA stroke, complicated with malignant cerebral edema requiring decompressive craniectomy, underwent cranioplasty and VP shunt, who presented with increasing headache, noted to have cranioplasty failure, today he underwent cranioplasty again, upon arrival to ICU patient was not responding, he had forced left gaze deviation, he became hypotensive, hypoxic and started gurgling.  PCCM was consulted for help evaluation medical management  Pertinent  Medical History   Past Medical History:  Diagnosis Date   Aorto-iliac atherosclerosis 08/29/2017   By CT   Asthma    Cardiomegaly 04/13/2016   Cerebral embolism with cerebral infarction 10/03/2018   DVT (deep venous thrombosis) (HCC)    Dysphagia following other cerebrovascular disease    Elevated serum creatinine    Essential hypertension 05/15/2017   GERD without esophagitis    Headache    Hemiparesis affecting left side as late effect of stroke 08/09/2017   History of ischemic right MCA stroke 2017   History of left MCA stroke 2020   History of renal stone 08/22/2017   Stone analysis - calcium stone (08/2017) 8 non obstructing L kidney by CT - will refer to urology (08/2017)   Homonymous bilateral field defects    Hyperlipidemia 05/15/2017   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   MDD (major depressive disorder) 05/15/2017   Neurogenic bladder    Presence of cerebrospinal fluid drainage device    Pulmonary embolism 01/13/2016   Seizure disorder 08/09/2017   Spastic hemiplegia affecting nondominant side 03/26/2018   Subluxation of shoulder joint 04/13/2016   left   Thrombocytopenia 10/01/2018   UTI (urinary tract infection)       Significant Hospital Events: Including procedures, antibiotic start and stop dates in addition to other pertinent events   5/6 weaning sedation while on EEG, remains intubated  Interim History / Subjective:  No overnight events  Edematous, 20L + EEG continues to show burst suppression  Plan to wean sedation while on EEG today  Off pressors   Objective   Blood pressure 127/86, pulse (!) 110, temperature 99.6 F (37.6 C), temperature source Axillary, resp. rate 20, height 5\' 10"  (1.778 m), weight 89.2 kg, SpO2 100 %. CVP:  [12 mmHg-14 mmHg] 14 mmHg  Vent Mode: PRVC FiO2 (%):  [40 %] 40 % Set Rate:  [20 bmp] 20 bmp Vt Set:  [580 mL] 580 mL PEEP:  [8 cmH20] 8 cmH20 Plateau Pressure:  [18 cmH20-22 cmH20] 18 cmH20   Intake/Output Summary (Last 24 hours) at 05/30/2022 0836 Last data filed at 05/30/2022 0800 Gross per 24 hour  Intake 6146.48 ml  Output 1880 ml  Net 4266.48 ml    Filed Weights   04/26/2022 0854 05/26/22 0500  Weight: 83 kg 89.2 kg    General:  critically ill intubated M HEENT: MM pink/moist, s/p cranioplasty, ETT in place Neuro: RASS -5 CV: s1s2 rrr, no m/r/g PULM:  mechanically ventilated without significant rhonchi or wheezing, thick white secretions suctioned  GI: soft, mildly distended, scattered BS Extremities: warm/dry, 3+  edema all extremities Skin: no rashes or lesions   Labs  Glucose 202 Chloride 113 Hgb 9.9 Platelets 79  Resolved Hospital Problem list   Acute kidney injury Propofol induced hypertriglyceridemia  Assessment & Plan:     Status epilepticus Acute encephalopathy due  to recurrent seizures Patient started seizing again after stopping sedation Sedation was put back on with propofol at 40 mics and midazolam at 10 mg/h -plan to gradually reduce sedation today and monitor on EEG for seizure recurrence  -appreciate Neurology following -Continue Keppra, Topamax, Klonopin and Depakote  -seen by Dr. Melynda Ripple today who spoke with  family, if he continues to seize could try Ketamine/Phenobarbital, but his prognosis remains poor, will consult palliative care    Acute hypoxic respiratory failure Severe sepsis with septic shock due to bilateral multifocal pneumonia Afebrile overnight -continue Unasyn for 7 days -Cultures remain negative --Maintain full vent support with SAT/SBT as tolerated -titrate Vent setting to maintain SpO2 greater than or equal to 90%. -HOB elevated 30 degrees. -Plateau pressures less than 30 cm H20.  -Follow chest x-ray, ABG prn.   -Bronchial hygiene and RT/bronchodilator protocol.   Lactic acidosis due to recurrent seizures and septic shock Persistent lactic acidosis -repeat in the AM  Prior right MCA stroke s/p craniectomy and now with cranioplasty Hydrocephalus s/p VP shunt -Postop management defer to neurosurgery -Shunt setting was changed by neurosurgery during this visit  Hiccups -no hiccups today, received Thorazine and Compazine   Acute blood loss anemia perioperatively Patient hemoglobin dropped down to 7.5 from 13 upon admission, part of this is dilutional in part due to blood loss -No signs of active bleeding -resume DVT prophylaxis toda7  Bleeding from gums and IV puncture site -received vitamin C 500 mg IV x 1   Thrombocytopenia critical illness Platelets stable -monitor CBC and watch for signs of bleeding  Hypokalemia/hypophosphatemia -Continue aggressive electrolyte supplement and monitor  Best Practice (right click and "Reselect all SmartList Selections" daily)   Diet/type: NPO tube feeds DVT prophylaxis: SCD GI prophylaxis: PPI Lines: NA Foley:  Yes, still needed Code Status:  full code Last date of multidisciplinary goals of care discussion [5/6: Patient's family was updated at bedside]  Labs   CBC: Recent Labs  Lab 05/22/2022 2132 05/25/22 0338 05/26/22 0201 05/26/22 0218 05/27/22 0735 05/27/22 1620 05/28/22 0635 05/29/22 0236  05/30/22 0647  WBC 18.9*   < > 10.5  --   --  3.7* 5.1 5.2 6.3  NEUTROABS 13.4*  --  5.0  --   --   --  2.9  --  PENDING  HGB 13.0   < > 8.2*   < > 6.8* 9.7* 10.2* 9.7* 9.9*  HCT 40.9   < > 24.3*   < > 20.0* 28.1* 29.4* 29.1* 29.7*  MCV 96.5   < > 92.7  --   --  91.5 90.5 92.4 94.0  PLT 188   < > 114*  --   --  46* 51* 61* 79*   < > = values in this interval not displayed.     Basic Metabolic Panel: Recent Labs  Lab 05/25/22 0500 05/25/22 1038 05/25/22 1449 05/25/22 2209 05/26/22 0201 05/26/22 0218 05/26/22 1606 05/27/22 0546 05/27/22 0735 05/28/22 0635 05/30/22 0647  NA 137   < >  --  140  --    < > 146* 145 147* 144 145  K 4.6   < >  --  3.9  --    < > 3.3* 3.6 3.5 3.4* 3.8  CL 109  --   --  111  --   --   --  115*  --  112* 113*  CO2 16*  --   --  19*  --   --   --  23  --  23 23  GLUCOSE 186*  --   --  148*  --   --   --  184*  --  141* 202*  BUN 16  --   --  15  --   --   --  18  --  18 15  CREATININE 1.30*  --   --  0.99  --   --   --  0.88  --  0.77 0.76  CALCIUM 7.6*  --   --  8.1*  --   --   --  7.3*  --  7.6* 7.3*  MG  --    < > 1.7 1.6* 2.6*  --   --  2.0  --  1.9 1.9  PHOS  --   --  2.9  --  1.3*  --   --  2.0*  --  2.0* 3.9   < > = values in this interval not displayed.    GFR: Estimated Creatinine Clearance: 124.2 mL/min (by C-G formula based on SCr of 0.76 mg/dL). Recent Labs  Lab 05/26/22 0201 05/26/22 0508 05/26/22 1627 05/26/22 1834 05/27/22 1620 05/28/22 0635 05/29/22 0236 05/30/22 0647  PROCALCITON  --   --  2.20  --   --   --   --   --   WBC 10.5  --   --   --  3.7* 5.1 5.2 6.3  LATICACIDVEN 2.6* 2.8* 3.7* 3.5*  --   --   --   --      Liver Function Tests: Recent Labs  Lab 05/28/22 0635  AST 19  ALT 11  ALKPHOS 55  BILITOT 0.5  PROT 4.0*  ALBUMIN 1.8*    No results for input(s): "LIPASE", "AMYLASE" in the last 168 hours. Recent Labs  Lab 05/27/22 1620  AMMONIA 38*     ABG    Component Value Date/Time   PHART 7.337  (L) 05/27/2022 0735   PCO2ART 41.5 05/27/2022 0735   PO2ART 127 (H) 05/27/2022 0735   HCO3 22.2 05/27/2022 0735   TCO2 23 05/27/2022 0735   ACIDBASEDEF 3.0 (H) 05/27/2022 0735   O2SAT 99 05/27/2022 0735     Coagulation Profile: Recent Labs  Lab 05/29/22 0236  INR 1.1     Cardiac Enzymes: No results for input(s): "CKTOTAL", "CKMB", "CKMBINDEX", "TROPONINI" in the last 168 hours.  HbA1C: Hgb A1c MFr Bld  Date/Time Value Ref Range Status  10/02/2018 03:33 AM 5.2 4.8 - 5.6 % Final    Comment:    (NOTE)         Prediabetes: 5.7 - 6.4         Diabetes: >6.4         Glycemic control for adults with diabetes: <7.0     CBG: Recent Labs  Lab 05/29/22 1533 05/29/22 1950 05/30/22 0000 05/30/22 0404 05/30/22 0754  GLUCAP 192* 228* 207* 205* 165*     Critical care time: 32 minutes    CRITICAL CARE Performed by: Darcella Gasman Rosie Torrez   Total critical care time: 32 minutes  Critical care time was exclusive of separately billable procedures and treating other patients.  Critical care was necessary to treat or prevent imminent or life-threatening deterioration.  Critical care was time spent personally by me on the following activities: development of treatment plan with patient and/or surrogate as well as nursing, discussions with consultants, evaluation of patient's response to treatment, examination of patient, obtaining history from patient or surrogate, ordering and performing  treatments and interventions, ordering and review of laboratory studies, ordering and review of radiographic studies, pulse oximetry and re-evaluation of patient's condition.  Darcella Gasman Kalev Temme, PA-C [facility] See Amion for pager If no response to pager , please call [phone] until 7pm After 7:00 pm call Elink  [phone]

## 2022-05-30 NOTE — Progress Notes (Signed)
Patient transported to CT scan from 4N27 and then back to 4N27. Patient tolerated trip well, VSS, no adverse events noted.

## 2022-05-30 NOTE — Progress Notes (Signed)
Providing Compassionate, Quality Care - Together   Subjective: Patient is intubated and sedated. Per Neurology no definite seizures on EEG. They will begin to decrease propofol and Versed.  Objective: Vital signs in last 24 hours: Temp:  [98.9 F (37.2 C)-100.1 F (37.8 C)] 98.9 F (37.2 C) (05/06 1100) Pulse Rate:  [110-143] 117 (05/06 1122) Resp:  [15-21] 19 (05/06 1122) BP: (97-129)/(64-86) 123/72 (05/06 1122) SpO2:  [92 %-100 %] 98 % (05/06 1122) Arterial Line BP: (98-131)/(62-86) 122/74 (05/06 1100) FiO2 (%):  [30 %-40 %] 30 % (05/06 1122)  Intake/Output from previous day: 05/05 0701 - 05/06 0700 In: 6139.7 [I.V.:3135.5; NG/GT:1155; IV Piggyback:1849.2] Out: 2030 [Urine:2030] Intake/Output this shift: Total I/O In: 830.8 [I.V.:510.8; NG/GT:220; IV Piggyback:100] Out: 350 [Urine:350]  Sedated Intubated Pupils 2 mm, sluggish No movement to painful stimulus Right hand and wrist swollen and appears bruised. Radial pulse intact. Capillary refill sluggish, fingertips cool. Cranioplasty site clean, dry, and intact  Lab Results: Recent Labs    05/29/22 0236 05/30/22 0647  WBC 5.2 6.3  HGB 9.7* 9.9*  HCT 29.1* 29.7*  PLT 61* 79*   BMET Recent Labs    05/28/22 0635 05/30/22 0647  NA 144 145  K 3.4* 3.8  CL 112* 113*  CO2 23 23  GLUCOSE 141* 202*  BUN 18 15  CREATININE 0.77 0.76  CALCIUM 7.6* 7.3*    Studies/Results: CT HEAD WO CONTRAST ( )  Result Date: 05/30/2022 CLINICAL DATA:  52 year old male with a history of right MCA infarct, craniotomy, sunken flap. Cranioplasty revision postoperative day 6. EXAM: CT HEAD WITHOUT CONTRAST TECHNIQUE: Contiguous axial images were obtained from the base of the skull through the vertex without intravenous contrast. RADIATION DOSE REDUCTION: This exam was performed according to the departmental dose-optimization program which includes automated exposure control, adjustment of the mA and/or kV according to patient size  and/or use of iterative reconstruction technique. COMPARISON:  Head CT 05/27/2022 and earlier. FINDINGS: Brain: Multi spatial postoperative right hemisphere hemorrhage superimposed on chronic right MCA territory encephalomalacia has not significantly changed since 05/27/2022. Postoperative pneumocephalus has regressed since that time. Para falcine and right peripheral extra-axial blood is most conspicuous as before, with areas up to 10-12 mm in thickness. Right tentorium layering blood as before. Basilar cisterns remain patent. Trace intraventricular hemorrhage layering in the left occipital horn is stable. Stable left superior frontal approach CSF shunt which communicates with the body of the left lateral ventricle on coronal image 28. No ventriculomegaly. No superimposed acute cortically based infarct. Vascular: No suspicious intracranial vascular hyperdensity. Skull: Stable recent right cranioplasty. Chronic left vertex burr hole. No new No acute osseous abnormality identified. Sinuses/Orbits: Increased bilateral paranasal sinus fluid and mucosal thickening. Right tympanic cavity and mastoids also more opacified. Mild left mastoid effusion is stable. Other: EEG leads remain in place. Stable postoperative appearance of the scalp. Chronic left superior and lateral convexity CSF shunt. Rightward gaze deviation. Also the patient is intubated now with oral enteric tube partially visible. IMPRESSION: 1. Multi spatial postoperative right hemisphere hemorrhage has not significantly changed since 05/27/2022. Postoperative pneumocephalus has regressed. No significant change in blood related mass effect superimposed on right MCA territory encephalomalacia. 2. Stable CSF shunt. Trace IVH. No ventriculomegaly. No new intracranial abnormality. 3. Intubated, with increased mastoid and paranasal sinus opacification. Electronically Signed   By: Odessa Fleming M.D.   On: 05/30/2022 06:54    Assessment/Plan: Patient underwent  cranioplasty by Dr. Jordan Likes on 05/15/2022. His postoperative course was complicated by status  epilepticus. Patient on propofol and Versed for burst suppression. Neurology and CCM are following.   LOS: 6 days   -Wean sedation per Neurology -Will discuss hand swelling with CCM. Pulse is intact at this time. -Continue supportive efforts.   Val Eagle, DNP, AGNP-C Nurse Practitioner  [facility] [address], [place], [place] [address] P: [phone]    F: [phone]  05/30/2022, 1:49 PM

## 2022-05-30 NOTE — Progress Notes (Addendum)
Subjective: No clinical seizures.  Sister who is also nurse at bedside along with sisters husband and son.  ROS: Unable to obtain due to poor mental status  Examination  Vital signs in last 24 hours: Temp:  [99.1 F (37.3 C)-100.1 F (37.8 C)] 99.1 F (37.3 C) (05/06 0800) Pulse Rate:  [110-143] 116 (05/06 1000) Resp:  [15-21] 20 (05/06 1000) BP: (97-127)/(64-86) 106/77 (05/06 1000) SpO2:  [92 %-100 %] 94 % (05/06 1000) Arterial Line BP: (98-131)/(62-86) 108/62 (05/06 1000) FiO2 (%):  [40 %] 40 % (05/06 0737)  General: lying in bed, intubated and sedated on propofol and Versed Neuro: On propofol at 20 mcg/kg/min, Versed at 10 ml/hr, comatose, does not open eyes to noxious stimuli, PERRLA, corneal reflex absent, initially had left and downward gaze deviation but did slowly return to midline during oculocephalics.  Unable to cross to the right side , cough reflex present, did not withdraw to noxious stimulation in bilateral upper extremities and right lower extremity, subtle withdrawal in left lower extremity.  Basic Metabolic Panel: Recent Labs  Lab 05/25/22 0500 05/25/22 1038 05/25/22 1449 05/25/22 2209 05/26/22 0201 05/26/22 0218 05/26/22 1606 05/27/22 0546 05/27/22 0735 05/28/22 0635 05/30/22 0647  NA 137   < >  --  140  --    < > 146* 145 147* 144 145  K 4.6   < >  --  3.9  --    < > 3.3* 3.6 3.5 3.4* 3.8  CL 109  --   --  111  --   --   --  115*  --  112* 113*  CO2 16*  --   --  19*  --   --   --  23  --  23 23  GLUCOSE 186*  --   --  148*  --   --   --  184*  --  141* 202*  BUN 16  --   --  15  --   --   --  18  --  18 15  CREATININE 1.30*  --   --  0.99  --   --   --  0.88  --  0.77 0.76  CALCIUM 7.6*  --   --  8.1*  --   --   --  7.3*  --  7.6* 7.3*  MG  --    < > 1.7 1.6* 2.6*  --   --  2.0  --  1.9 1.9  PHOS  --   --  2.9  --  1.3*  --   --  2.0*  --  2.0* 3.9   < > = values in this interval not displayed.    CBC: Recent Labs  Lab 05/03/2022 2132  05/25/22 0338 05/26/22 0201 05/26/22 0218 05/27/22 0735 05/27/22 1620 05/28/22 0635 05/29/22 0236 05/30/22 0647  WBC 18.9*   < > 10.5  --   --  3.7* 5.1 5.2 6.3  NEUTROABS 13.4*  --  5.0  --   --   --  2.9  --  2.5  HGB 13.0   < > 8.2*   < > 6.8* 9.7* 10.2* 9.7* 9.9*  HCT 40.9   < > 24.3*   < > 20.0* 28.1* 29.4* 29.1* 29.7*  MCV 96.5   < > 92.7  --   --  91.5 90.5 92.4 94.0  PLT 188   < > 114*  --   --  46* 51* 61* 79*   < > =  values in this interval not displayed.     Coagulation Studies: Recent Labs    05/29/22 0236  LABPROT 13.9  INR 1.1    Imaging CT head without contrast 05/30/2022:  1. Multi spatial postoperative right hemisphere hemorrhage has not significantly changed since 05/27/2022. Postoperative pneumocephalus has regressed. No significant change in blood related mass effect superimposed on right MCA territory encephalomalacia. 2. Stable CSF shunt. Trace IVH. No ventriculomegaly. No new intracranial abnormality. 3. Intubated, with increased mastoid and paranasal sinus opacification.   ASSESSMENT AND PLAN: 52 year old male admitted for cranioplasty revision on 4/30.  High-frequency hearing after reducing sedation arising from left occipital region.  Status epilepticus -No definite seizures on EEG but continues to have polyspikes in left occipital region.  Recommendations -Will reduce propofol to 25 and then wean by 5 mcg/kg/min every hour -Continue Versed at 10 ml/hr.  Will decide if we can wean further depending on EEG findings after propofol is weaned - Increase keppra to 2000mg  BID and clonazepam to 0.5mg  TID -Continue Vimpat 200 mg twice daily, Depakote 1000 mg twice daily, topiramate 100 mg daily  -If seizures recur, can increase Keppra to 2000 mg twice daily -Continue LTM EEG while weaning sedation -Discussed plan with family at bedside as well as ICU team   ADDENDUM - Worsening LPDs in left occipital region, will load with perampanel 8mg    CRITICAL  CARE Performed by: Charlsie Quest   Total critical care time: 40 minutes  Critical care time was exclusive of separately billable procedures and treating other patients.  Critical care was necessary to treat or prevent imminent or life-threatening deterioration.  Critical care was time spent personally by me on the following activities: development of treatment plan with patient and/or surrogate as well as nursing, discussions with consultants, evaluation of patient's response to treatment, examination of patient, obtaining history from patient or surrogate, ordering and performing treatments and interventions, ordering and review of laboratory studies, ordering and review of radiographic studies, pulse oximetry and re-evaluation of patient's condition.    Lindie Spruce Epilepsy [facility] For questions after 5pm please refer to AMION to reach the Neurologist on call

## 2022-05-30 NOTE — Procedures (Signed)
Patient Name: Patrick Franklin  MRN: [id number]  Epilepsy Attending: Charlsie Quest  Referring Physician/Provider: Cheri Fowler, MD  Duration: 05/29/2022 1100 to 05/30/2022 1100   Patient history: 52 year old male with hypertension, hyperlipidemia and prior right MCA stroke, complicated with malignant cerebral edema requiring decompressive craniectomy, underwent cranioplasty and VP shunt, who presented with increasing headache, noted to have cranioplasty failure, today he underwent cranioplasty again, upon arrival to ICU patient was not responding, he had forced left gaze deviation, he became hypotensive, hypoxic and started gurgling. EEG to evaluate for seizure   Level of alertness:  lethargic /sedated   AEDs during EEG study: LCM, TPM, VPA, LEV, Clonazepam, propofol, versed   Technical aspects: This EEG study was done with scalp electrodes positioned according to the 10-20 International system of electrode placement. Electrical activity was reviewed with band pass filter of 1-70Hz , sensitivity of 7 uV/mm, display speed of 46mm/sec with a 60Hz  notched filter applied as appropriate. EEG data were recorded continuously and digitally stored.  Video monitoring was available and reviewed as appropriate.   Description: EEG showed bursts suppression pattern with bursts of generalized 3-6hz  theta-delta slowing admixed with polyspikes in left occipital region lasting 2-5 seconds alternating with 3-5 seconds of generalized eeg suppression   ABNORMALITY - Burst suppression, generalized - Polyspikes, left occipital region    IMPRESSION: This study showed evidence of epileptogenicity arising from left occipital region. Additionally there was profound diffuse encephalopathy, likely related to sedation.  No seizures were noted.   Ariya Bohannon Annabelle Harman

## 2022-05-31 DIAGNOSIS — J9601 Acute respiratory failure with hypoxia: Secondary | ICD-10-CM | POA: Diagnosis not present

## 2022-05-31 DIAGNOSIS — J690 Pneumonitis due to inhalation of food and vomit: Secondary | ICD-10-CM

## 2022-05-31 DIAGNOSIS — M952 Other acquired deformity of head: Secondary | ICD-10-CM | POA: Diagnosis not present

## 2022-05-31 DIAGNOSIS — R579 Shock, unspecified: Secondary | ICD-10-CM | POA: Diagnosis not present

## 2022-05-31 DIAGNOSIS — Z982 Presence of cerebrospinal fluid drainage device: Secondary | ICD-10-CM | POA: Diagnosis not present

## 2022-05-31 DIAGNOSIS — Z8673 Personal history of transient ischemic attack (TIA), and cerebral infarction without residual deficits: Secondary | ICD-10-CM

## 2022-05-31 DIAGNOSIS — Z9889 Other specified postprocedural states: Secondary | ICD-10-CM | POA: Diagnosis not present

## 2022-05-31 DIAGNOSIS — R569 Unspecified convulsions: Secondary | ICD-10-CM | POA: Diagnosis not present

## 2022-05-31 DIAGNOSIS — Z7189 Other specified counseling: Secondary | ICD-10-CM

## 2022-05-31 DIAGNOSIS — Z515 Encounter for palliative care: Secondary | ICD-10-CM

## 2022-05-31 DIAGNOSIS — G40901 Epilepsy, unspecified, not intractable, with status epilepticus: Secondary | ICD-10-CM | POA: Diagnosis not present

## 2022-05-31 LAB — CBC
HCT: 33.1 % — ABNORMAL LOW (ref 39.0–52.0)
Hemoglobin: 10.9 g/dL — ABNORMAL LOW (ref 13.0–17.0)
MCH: 31 pg (ref 26.0–34.0)
MCHC: 32.9 g/dL (ref 30.0–36.0)
MCV: 94 fL (ref 80.0–100.0)
Platelets: 105 10*3/uL — ABNORMAL LOW (ref 150–400)
RBC: 3.52 MIL/uL — ABNORMAL LOW (ref 4.22–5.81)
RDW: 15 % (ref 11.5–15.5)
WBC: 7 10*3/uL (ref 4.0–10.5)
nRBC: 1 % — ABNORMAL HIGH (ref 0.0–0.2)

## 2022-05-31 LAB — CULTURE, RESPIRATORY W GRAM STAIN

## 2022-05-31 LAB — GLUCOSE, CAPILLARY
Glucose-Capillary: 149 mg/dL — ABNORMAL HIGH (ref 70–99)
Glucose-Capillary: 161 mg/dL — ABNORMAL HIGH (ref 70–99)
Glucose-Capillary: 192 mg/dL — ABNORMAL HIGH (ref 70–99)
Glucose-Capillary: 201 mg/dL — ABNORMAL HIGH (ref 70–99)
Glucose-Capillary: 273 mg/dL — ABNORMAL HIGH (ref 70–99)
Glucose-Capillary: 327 mg/dL — ABNORMAL HIGH (ref 70–99)

## 2022-05-31 LAB — BASIC METABOLIC PANEL
Anion gap: 9 (ref 5–15)
BUN: 15 mg/dL (ref 6–20)
CO2: 21 mmol/L — ABNORMAL LOW (ref 22–32)
Calcium: 7.2 mg/dL — ABNORMAL LOW (ref 8.9–10.3)
Chloride: 112 mmol/L — ABNORMAL HIGH (ref 98–111)
Creatinine, Ser: 0.7 mg/dL (ref 0.61–1.24)
GFR, Estimated: >60 mL/min (ref >60)
Glucose, Bld: 171 mg/dL — ABNORMAL HIGH (ref 70–99)
Potassium: 3.6 mmol/L (ref 3.5–5.1)
Sodium: 142 mmol/L (ref 135–145)

## 2022-05-31 LAB — MRSA NEXT GEN BY PCR, NASAL: MRSA by PCR Next Gen: NOT DETECTED

## 2022-05-31 LAB — LACTIC ACID, PLASMA: Lactic Acid, Venous: 1.1 mmol/L (ref 0.5–1.9)

## 2022-05-31 MED ORDER — DEXMEDETOMIDINE HCL IN NACL 400 MCG/100ML IV SOLN
INTRAVENOUS | Status: AC
  Filled 2022-05-31: qty 100

## 2022-05-31 MED ORDER — MIDAZOLAM HCL 2 MG/2ML IJ SOLN
2.0000 mg | INTRAMUSCULAR | Status: DC | PRN
  Filled 2022-05-31: qty 2

## 2022-05-31 MED ORDER — METOPROLOL TARTRATE 5 MG/5ML IV SOLN
5.0000 mg | INTRAVENOUS | Status: DC | PRN
  Administered 2022-05-31 (×2): 5 mg via INTRAVENOUS
  Filled 2022-05-31 (×2): qty 5

## 2022-05-31 MED ORDER — POTASSIUM CHLORIDE 20 MEQ PO PACK
40.0000 meq | PACK | Freq: Once | ORAL | Status: AC
  Administered 2022-05-31: 40 meq
  Filled 2022-05-31: qty 2

## 2022-05-31 MED ORDER — ROCURONIUM BROMIDE 10 MG/ML (PF) SYRINGE: 100.0000 mg | PREFILLED_SYRINGE | INTRAVENOUS | Status: AC

## 2022-05-31 MED ORDER — INSULIN GLARGINE-YFGN 100 UNIT/ML ~~LOC~~ SOLN
5.0000 [IU] | Freq: Two times a day (BID) | SUBCUTANEOUS | Status: DC
  Administered 2022-05-31 – 2022-06-02 (×5): 5 [IU] via SUBCUTANEOUS
  Filled 2022-05-31 (×6): qty 0.05

## 2022-05-31 MED ORDER — ROCURONIUM BROMIDE 10 MG/ML (PF) SYRINGE
PREFILLED_SYRINGE | INTRAVENOUS | Status: AC
  Administered 2022-05-31: 100 mg via INTRAVENOUS
  Filled 2022-05-31: qty 10

## 2022-05-31 MED ORDER — PERAMPANEL 2 MG PO TABS
2.0000 mg | ORAL_TABLET | Freq: Every day | ORAL | Status: DC
  Administered 2022-05-31 – 2022-06-02 (×3): 2 mg
  Filled 2022-05-31 (×3): qty 1

## 2022-05-31 MED ORDER — FUROSEMIDE 10 MG/ML IJ SOLN
40.0000 mg | Freq: Two times a day (BID) | INTRAMUSCULAR | Status: DC
  Administered 2022-05-31 – 2022-06-04 (×9): 40 mg via INTRAVENOUS
  Filled 2022-05-31 (×10): qty 4

## 2022-05-31 MED ORDER — METOPROLOL TARTRATE 25 MG PO TABS
25.0000 mg | ORAL_TABLET | Freq: Three times a day (TID) | ORAL | Status: DC
  Administered 2022-05-31 – 2022-06-04 (×12): 25 mg
  Filled 2022-05-31 (×13): qty 1

## 2022-05-31 MED ORDER — TOPIRAMATE 25 MG PO TABS
100.0000 mg | ORAL_TABLET | Freq: Two times a day (BID) | ORAL | Status: DC
  Administered 2022-05-31 – 2022-06-01 (×2): 100 mg
  Filled 2022-05-31 (×2): qty 4

## 2022-05-31 MED ORDER — ENOXAPARIN SODIUM 40 MG/0.4ML IJ SOSY
40.0000 mg | PREFILLED_SYRINGE | INTRAMUSCULAR | Status: DC
  Administered 2022-05-31 – 2022-06-06 (×7): 40 mg via SUBCUTANEOUS
  Filled 2022-05-31 (×7): qty 0.4

## 2022-05-31 MED ORDER — LINEZOLID 600 MG/300ML IV SOLN
600.0000 mg | Freq: Two times a day (BID) | INTRAVENOUS | Status: AC
  Administered 2022-05-31 – 2022-06-06 (×14): 600 mg via INTRAVENOUS
  Filled 2022-05-31 (×14): qty 300

## 2022-05-31 MED ORDER — PIPERACILLIN-TAZOBACTAM 3.375 G IVPB
3.3750 g | Freq: Three times a day (TID) | INTRAVENOUS | Status: AC
  Administered 2022-05-31 – 2022-06-06 (×21): 3.375 g via INTRAVENOUS
  Filled 2022-05-31 (×21): qty 50

## 2022-05-31 MED ORDER — DEXMEDETOMIDINE HCL IN NACL 400 MCG/100ML IV SOLN
0.0000 ug/kg/h | INTRAVENOUS | Status: DC
  Administered 2022-05-31: 0.4 ug/kg/h via INTRAVENOUS

## 2022-05-31 NOTE — Progress Notes (Signed)
RT placed pt in CPAP/PS, 10/5, mode on ventilator. Due to increased RR to 40's and decrease in saturation to 88%, RT placed pt back in full ventilator support at previous settings.

## 2022-05-31 NOTE — Progress Notes (Signed)
Subjective: No clinical seizures. Sister and mother at bedside.  ROS: Unable to obtain due to poor mental status  Examination  Vital signs in last 24 hours: Temp:  [98 F (36.7 C)-101.5 F (38.6 C)] 98 F (36.7 C) (05/07 1600) Pulse Rate:  [99-140] 112 (05/07 1446) Resp:  [20-30] 27 (05/07 1446) BP: (85-122)/(43-92) 106/89 (05/07 1400) SpO2:  [92 %-100 %] 96 % (05/07 1446) Arterial Line BP: (99-174)/(63-112) 128/83 (05/07 1400) FiO2 (%):  [30 %] 30 % (05/07 1446) Weight:  [107.8 kg] 107.8 kg (05/07 0712)  General: lying in bed, intubated and sedated on propofol and Versed Neuro: comatose, winces but does not open eyes to noxious stimuli, PERRLA, corneal reflex absent, no forced gaze deviation, cough reflex present, withdraws to noxious stimulation in all extremities except right upper extremity  Basic Metabolic Panel: Recent Labs  Lab 05/25/22 1449 05/25/22 2209 05/26/22 0201 05/26/22 0218 05/27/22 0546 05/27/22 0735 05/28/22 0635 05/30/22 0647 05/31/22 0353  NA  --  140  --    < > 145 147* 144 145 142  K  --  3.9  --    < > 3.6 3.5 3.4* 3.8 3.6  CL  --  111  --   --  115*  --  112* 113* 112*  CO2  --  19*  --   --  23  --  23 23 21*  GLUCOSE  --  148*  --   --  184*  --  141* 202* 171*  BUN  --  15  --   --  18  --  18 15 15   CREATININE  --  0.99  --   --  0.88  --  0.77 0.76 0.70  CALCIUM  --  8.1*  --   --  7.3*  --  7.6* 7.3* 7.2*  MG 1.7 1.6* 2.6*  --  2.0  --  1.9 1.9  --   PHOS 2.9  --  1.3*  --  2.0*  --  2.0* 3.9  --    < > = values in this interval not displayed.    CBC: Recent Labs  Lab 05/09/2022 2132 05/25/22 0338 05/26/22 0201 05/26/22 0218 05/27/22 1620 05/28/22 0635 05/29/22 0236 05/30/22 0647 05/31/22 0353  WBC 18.9*   < > 10.5  --  3.7* 5.1 5.2 6.3 7.0  NEUTROABS 13.4*  --  5.0  --   --  2.9  --  2.5  --   HGB 13.0   < > 8.2*   < > 9.7* 10.2* 9.7* 9.9* 10.9*  HCT 40.9   < > 24.3*   < > 28.1* 29.4* 29.1* 29.7* 33.1*  MCV 96.5   < > 92.7   --  91.5 90.5 92.4 94.0 94.0  PLT 188   < > 114*  --  46* 51* 61* 79* 105*   < > = values in this interval not displayed.     Coagulation Studies: Recent Labs    05/29/22 0236  LABPROT 13.9  INR 1.1    Imaging No new brain imaging   ASSESSMENT AND PLAN: 52 year old male admitted for cranioplasty revision on 4/30.  High-frequency hearing after reducing sedation arising from left occipital region.   Status epilepticus -No definite seizures on EEG but continues to have LPDs in left occipital region which at times appear rhythmic.   Recommendations -Will reduce versed to 5mg /her and after 2 hours stop - Will start maintenance Permapnel 2mg  QHS - Will increase  Topiramate to 100mg  BID - Continue Keppra 2000mg  BID, Clonazepam to 0.5mg  TID, Vimpat 200 mg twice daily, Depakote 1000 mg twice daily -Continue LTM EEG while weaning sedation - Discussed with family that if seizures recur and are frequent, then we can consider versed and ketamine or pentobarb coma but refractory status epilepticus has very poor prognosis with significantly increased morbidity and mortality. Family understands.  - Continue seizure precautions - PRN IV versed for clinical seizures -Discussed plan with family at bedside as well as ICU team    CRITICAL CARE Performed by: Charlsie Quest     Total critical care time: 36 minutes   Critical care time was exclusive of separately billable procedures and treating other patients.   Critical care was necessary to treat or prevent imminent or life-threatening deterioration.   Critical care was time spent personally by me on the following activities: development of treatment plan with patient and/or surrogate as well as nursing, discussions with consultants, evaluation of patient's response to treatment, examination of patient, obtaining history from patient or surrogate, ordering and performing treatments and interventions, ordering and review of laboratory studies,  ordering and review of radiographic studies, pulse oximetry and re-evaluation of patient's condition.    Lindie Spruce Epilepsy [facility] For questions after 5pm please refer to AMION to reach the Neurologist on call

## 2022-05-31 NOTE — Progress Notes (Addendum)
NAME:  Patrick Franklin, MRN:  [id number], DOB:  1970-11-06, LOS: 7 ADMISSION DATE:  05/05/2022, CONSULTATION DATE: 05/23/2022 REFERRING MD: Dr. Julio Sicks, CHIEF COMPLAINT: Altered mental status  History of Present Illness:  52 year old male with hypertension, hyperlipidemia and prior right MCA stroke, complicated with malignant cerebral edema requiring decompressive craniectomy, underwent cranioplasty and VP shunt, who presented with increasing headache, noted to have cranioplasty failure, today he underwent cranioplasty again, upon arrival to ICU patient was not responding, he had forced left gaze deviation, he became hypotensive, hypoxic and started gurgling.  PCCM was consulted for help evaluation medical management  Pertinent  Medical History   Past Medical History:  Diagnosis Date   Aorto-iliac atherosclerosis 08/29/2017   By CT   Asthma    Cardiomegaly 04/13/2016   Cerebral embolism with cerebral infarction 10/03/2018   DVT (deep venous thrombosis) (HCC)    Dysphagia following other cerebrovascular disease    Elevated serum creatinine    Essential hypertension 05/15/2017   GERD without esophagitis    Headache    Hemiparesis affecting left side as late effect of stroke 08/09/2017   History of ischemic right MCA stroke 2017   History of left MCA stroke 2020   History of renal stone 08/22/2017   Stone analysis - calcium stone (08/2017) 8 non obstructing L kidney by CT - will refer to urology (08/2017)   Homonymous bilateral field defects    Hyperlipidemia 05/15/2017   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   MDD (major depressive disorder) 05/15/2017   Neurogenic bladder    Presence of cerebrospinal fluid drainage device    Pulmonary embolism 01/13/2016   Seizure disorder 08/09/2017   Spastic hemiplegia affecting nondominant side 03/26/2018   Subluxation of shoulder joint 04/13/2016   left   Thrombocytopenia 10/01/2018   UTI (urinary tract infection)       Significant Hospital Events: Including procedures, antibiotic start and stop dates in addition to other pertinent events   5/6 weaning sedation while on EEG, remains intubated  Interim History / Subjective:  Patient started spiking fever again with Tmax 101.5 He is anasarcic with, 20L + Made 3.7 L of urine with 40 mg of Lasix, remains net -1 L Propofol was stopped yesterday Remain off vasopressor support Still remains tachycardic into 130s to 140s  Objective   Blood pressure 99/78, pulse (!) 104, temperature 98.8 F (37.1 C), temperature source Oral, resp. rate 20, height 5\' 10"  (1.778 m), weight 89.2 kg, SpO2 96 %. CVP:  [10 mmHg-22 mmHg] 10 mmHg  Vent Mode: PRVC FiO2 (%):  [30 %] 30 % Set Rate:  [20 bmp] 20 bmp Vt Set:  [580 mL] 580 mL PEEP:  [5 cmH20] 5 cmH20 Plateau Pressure:  [16 cmH20-22 cmH20] 18 cmH20   Intake/Output Summary (Last 24 hours) at 05/31/2022 0839 Last data filed at 05/31/2022 0700 Gross per 24 hour  Intake 2583.12 ml  Output 3645 ml  Net -1061.88 ml   Filed Weights   04/25/2022 0854 05/26/22 0500  Weight: 83 kg 89.2 kg     Physical exam: General: Crtitically ill-appearing male, orally intubated.  Anasarca HEENT: S/p cranioplasty with staples in place on R side, eyes anicteric.  ETT and OGT in place.  Weak gag and cough present Neuro: Sedated, not following commands.  Eyes are closed.  Pupils 3 mm bilateral reactive to light, withdrawing in bilateral lower extremities, minimal flicker response to bilateral upper extremities Chest: Bilateral crackles right more than left, no wheezes or rhonchi.  ET tube with thick greenish secretions Heart: Tachycardic, regular rhythm, no murmurs or gallops Abdomen: Soft, nondistended, bowel sounds present Skin: No rash  Labs and images were reviewed  Resolved Hospital Problem list   Acute kidney injury Propofol induced hypertriglyceridemia Lactic acidosis due to recurrent seizures/septic  shock Hiccups  Assessment & Plan:  Status epilepticus Acute encephalopathy due to recurrent seizures Patient started seizing again after stopping sedation He is off propofol now EEG read is pending Probably will start tapering midazolam while he remains on EEG Neurology is following Continue Keppra, Topamax, Klonopin and Depakote  Unfortunately if he continued to seize we could try ketamine/phenobarbital  Acute hypoxic respiratory failure Severe sepsis with septic shock due to bilateral multifocal pneumonia Started spiking fever again with Tmax 101.5 Has received total of 7 days of antibiotics Off vasopressor support Will perform bronchoscopy, send respiratory culture Remove central line and Foley Cultures remain negative Continue lung protective ventilation Did not tolerate spontaneous breathing trial due to tachypnea and hypoxia Continue titrate FiO2 and PEEP with O2 sat goal 92% HOB elevated 30 degrees. Peak and plateau pressures are at goal.  Prior right MCA stroke s/p craniectomy and now with cranioplasty Hydrocephalus s/p VP shunt Postop management defer to neurosurgery Shunt setting was changed by neurosurgery during this visit  Acute blood loss anemia perioperatively Patient hemoglobin dropped down to 7.5 from 13 upon admission, part of this is dilutional in part due to blood loss No signs of active bleeding H&H remained stable  Bleeding from gums and IV puncture site S/p vitamin C 500 mg x 1  Thrombocytopenia critical illness Platelets are improving -monitor CBC and watch for signs of bleeding  Hypokalemia/hypophosphatemia Continue aggressive electrolyte supplement and monitor  Anasarca Patient received lots of IV fluid, he is net +30 L since admission Increase Lasix 40 mg twice daily  Best Practice (right click and "Reselect all SmartList Selections" daily)   Diet/type: NPO tube feeds DVT prophylaxis: Subcu Lovenox GI prophylaxis: PPI Lines:  NA Foley:  Yes, still needed Code Status:  full code Last date of multidisciplinary goals of care discussion [5/6: Patient's family was updated at bedside]  Labs   CBC: Recent Labs  Lab 05/11/2022 2132 05/25/22 0338 05/26/22 0201 05/26/22 0218 05/27/22 1620 05/28/22 0635 05/29/22 0236 05/30/22 0647 05/31/22 0353  WBC 18.9*   < > 10.5  --  3.7* 5.1 5.2 6.3 7.0  NEUTROABS 13.4*  --  5.0  --   --  2.9  --  2.5  --   HGB 13.0   < > 8.2*   < > 9.7* 10.2* 9.7* 9.9* 10.9*  HCT 40.9   < > 24.3*   < > 28.1* 29.4* 29.1* 29.7* 33.1*  MCV 96.5   < > 92.7  --  91.5 90.5 92.4 94.0 94.0  PLT 188   < > 114*  --  46* 51* 61* 79* 105*   < > = values in this interval not displayed.    Basic Metabolic Panel: Recent Labs  Lab 05/25/22 1449 05/25/22 2209 05/26/22 0201 05/26/22 0218 05/27/22 0546 05/27/22 0735 05/28/22 0635 05/30/22 0647 05/31/22 0353  NA  --  140  --    < > 145 147* 144 145 142  K  --  3.9  --    < > 3.6 3.5 3.4* 3.8 3.6  CL  --  111  --   --  115*  --  112* 113* 112*  CO2  --  19*  --   --  23  --  23 23 21*  GLUCOSE  --  148*  --   --  184*  --  141* 202* 171*  BUN  --  15  --   --  18  --  18 15 15   CREATININE  --  0.99  --   --  0.88  --  0.77 0.76 0.70  CALCIUM  --  8.1*  --   --  7.3*  --  7.6* 7.3* 7.2*  MG 1.7 1.6* 2.6*  --  2.0  --  1.9 1.9  --   PHOS 2.9  --  1.3*  --  2.0*  --  2.0* 3.9  --    < > = values in this interval not displayed.   GFR: Estimated Creatinine Clearance: 124.2 mL/min (by C-G formula based on SCr of 0.7 mg/dL). Recent Labs  Lab 05/26/22 0508 05/26/22 1627 05/26/22 1834 05/27/22 1620 05/28/22 0635 05/29/22 0236 05/30/22 0647 05/31/22 0353  PROCALCITON  --  2.20  --   --   --   --   --   --   WBC  --   --   --    < > 5.1 5.2 6.3 7.0  LATICACIDVEN 2.8* 3.7* 3.5*  --   --   --   --  1.1   < > = values in this interval not displayed.    Liver Function Tests: Recent Labs  Lab 05/28/22 0635  AST 19  ALT 11  ALKPHOS 55   BILITOT 0.5  PROT 4.0*  ALBUMIN 1.8*   No results for input(s): "LIPASE", "AMYLASE" in the last 168 hours. Recent Labs  Lab 05/27/22 1620  AMMONIA 38*    ABG    Component Value Date/Time   PHART 7.337 (L) 05/27/2022 0735   PCO2ART 41.5 05/27/2022 0735   PO2ART 127 (H) 05/27/2022 0735   HCO3 22.2 05/27/2022 0735   TCO2 23 05/27/2022 0735   ACIDBASEDEF 3.0 (H) 05/27/2022 0735   O2SAT 99 05/27/2022 0735     Coagulation Profile: Recent Labs  Lab 05/29/22 0236  INR 1.1    Cardiac Enzymes: No results for input(s): "CKTOTAL", "CKMB", "CKMBINDEX", "TROPONINI" in the last 168 hours.  HbA1C: Hgb A1c MFr Bld  Date/Time Value Ref Range Status  10/02/2018 03:33 AM 5.2 4.8 - 5.6 % Final    Comment:    (NOTE)         Prediabetes: 5.7 - 6.4         Diabetes: >6.4         Glycemic control for adults with diabetes: <7.0     CBG: Recent Labs  Lab 05/30/22 1535 05/30/22 1958 05/30/22 2327 05/31/22 0335 05/31/22 0736  GLUCAP 157* 202* 188* 161* 149*    Critical care time:     This patient is critically ill with multiple organ system failure which requires frequent high complexity decision making, assessment, support, evaluation, and titration of therapies. This was completed through the application of advanced monitoring technologies and extensive interpretation of multiple databases.  During this encounter critical care time was devoted to patient care services described in this note for 34 minutes.    Cheri Fowler, MD [facility] See Amion for pager If no response to pager, please call [phone] until 7pm After 7pm, Please call E-link [phone]

## 2022-05-31 NOTE — Procedures (Signed)
Bronchoscopy Procedure Note  Patrick Franklin  [id number]  [birth year]  Date:05/31/22  Time:9:57 AM   Provider Performing:Kydan Shanholtzer   Procedure(s):  Flexible bronchoscopy with bronchial alveolar lavage (78295)  Indication(s) Acute respiratory failure with recurrent fever  Consent Risks of the procedure as well as the alternatives and risks of each were explained to the patient and/or caregiver.  Consent for the procedure was obtained and is signed in the bedside chart  Anesthesia Versed and rocuronium   Time Out Verified patient identification, verified procedure, site/side was marked, verified correct patient position, special equipment/implants available, medications/allergies/relevant history reviewed, required imaging and test results available.   Sterile Technique Usual hand hygiene, masks, gowns, and gloves were used   Procedure Description Bronchoscope advanced through endotracheal tube and into airway.  Airways were examined down to subsegmental level with findings noted below.   Following diagnostic evaluation, BAL(s) performed in RLL/LLL with normal saline and return of Greenish pus fluid  Findings: Moderate of tenacious secretions noted mostly in RLL/RML   Complications/Tolerance None; patient tolerated the procedure well. Chest X-ray is not needed post procedure.   EBL Minimal   Specimen(s) BAL

## 2022-05-31 NOTE — Progress Notes (Signed)
eLink Physician-Brief Progress Note Patient Name: Patrick Franklin DOB: Jul 24, 1970 MRN: [id number]   Date of Service  05/31/2022  HPI/Events of Note  52 year old male with underlying MCA infarction status post decompressive craniotomy who has been persistently tachycardic  eICU Interventions  Likely in genic response, adequately sedated, on no other hemodynamic distress.  Will add on metoprolol for persistent tachycardia above 120.     Intervention Category Intermediate Interventions: Arrhythmia - evaluation and management  Jaquisha Frech 05/31/2022, 2:56 AM

## 2022-05-31 NOTE — Progress Notes (Signed)
No new issues or problems.  Patient with low-grade fevers and likely pneumonia.  On antibiotics.  Continues to be sedated on Versed.  No frank seizures on latest EEG.  Wound healing well.  Overall I am not sure there is anything really to do other than let his brain reequilibrated to its new environment and hope that the seizures generally subside if the brain becomes less irritable.

## 2022-05-31 NOTE — Procedures (Signed)
Patient Name: Patrick Franklin  MRN: [id number]  Epilepsy Attending: Charlsie Quest  Referring Physician/Provider: Cheri Fowler, MD  Duration: 05/30/2022 1100 to 05/31/2022 1100   Patient history: 52 year old male with hypertension, hyperlipidemia and prior right MCA stroke, complicated with malignant cerebral edema requiring decompressive craniectomy, underwent cranioplasty and VP shunt, who presented with increasing headache, noted to have cranioplasty failure, today he underwent cranioplasty again, upon arrival to ICU patient was not responding, he had forced left gaze deviation, he became hypotensive, hypoxic and started gurgling. EEG to evaluate for seizure   Level of alertness:  lethargic /sedated   AEDs during EEG study: LCM, TPM, VPA, LEV, Clonazepam, Perampanel, versed   Technical aspects: This EEG study was done with scalp electrodes positioned according to the 10-20 International system of electrode placement. Electrical activity was reviewed with band pass filter of 1-70Hz , sensitivity of 7 uV/mm, display speed of 26mm/sec with a 60Hz  notched filter applied as appropriate. EEG data were recorded continuously and digitally stored.  Video monitoring was available and reviewed as appropriate.   Description: EEG showed continuous generalized and lateralized right hemisphere low amplitude 2-3hz  delta slowing. Lateralized periodic discharges were noted arising from left hemisphere, maximal left occipital region with fluctuating frequency of 0.5 to 2 Hz.    ABNORMALITY - Lateralized periodic discharges, left hemisphere, maximal left occipital region  - Continuous slow, generalized and lateralized right hemisphere   IMPRESSION: This study showed evidence of epileptogenicity arising from left hemisphere, maximal left occipital region with increased risk of seizures. Additionally there was profound diffuse encephalopathy, likely related to sedation.  No definite seizures were noted.    Hazyl Marseille Annabelle Harman

## 2022-05-31 NOTE — Progress Notes (Signed)
RN notified E-link about patient's heart rate sustaining in 130s. RN inquired about getting  PRN to treat heart rate. No new orders at this time.

## 2022-05-31 NOTE — Progress Notes (Signed)
LTM maint complete - no skin breakdown under:  CZ, F4, 02

## 2022-05-31 NOTE — Consult Note (Incomplete)
Palliative Care Consult Note                                  Date: 05/31/2022   Patient Name: Patrick Franklin  DOB: 1970/09/13  MRN: [id number]  Age / Sex: 52 y.o., male  PCP: Patrick Rakes, MD Referring Physician: Julio Sicks, MD  Reason for Consultation: Establishing goals of care  HPI/Patient Profile: 52 y.o. 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 He subsequently developed a seizure disorder. He then had another stroke in 2020, this time it was left MCA. For the past 6 months, patient was having worsening headaches and was ultimately found to have failure of his craniotomy.  He presented to [facility] on 05/22/2022 for craniotomy revision. Post-op course was complicated by status epilepticus requiring intubation.  Palliative Medicine has been consulted for goals of care discussions.   Subjective:   I have reviewed medical records including progress notes, labs and imaging, received an update from the RN, and assessed the patient at bedside.   I met with family in the 4N conference room to discuss diagnosis, prognosis, and goals of care. Present are his sister/Patrick Franklin and her husband Patrick Franklin, son/Patrick Franklin, and daughter/Patrick Franklin.   I introduced Palliative Medicine as specialized medical care for people living with serious illness. It focuses on providing relief from the symptoms and stress of a serious illness.   We discussed patient's current illness and what it means in the larger context of his ongoing co-morbidities. Current clinical status was reviewed.   Created space and opportunity for family to express thoughts and feelings regarding current medical situation. Values and goals of care important to patient and family were attempted to be elicited.  A discussion was had today regarding advanced directives. Concepts  specific to code status, artifical feeding and hydration, continued IV antibiotics and rehospitalization was had.    Life Review: Family describes Patrick Franklin as "great guy", active, and loves people. He is originally from [place]. Prior to the stroke in 2017, he worked as a Emergency planning/management officer for a [employer]. He is not currently married. He has 4 children (from 3 marriages): Patrick Franklin (age 63), Patrick Franklin (age 37), Patrick Franklin (age 11), and Patrick Franklin (age 53).   Functional Status: [name] has resided in a SNF [employer] [facility]) since At baseline, Patrick Franklin is wheelchair bound. He was able to pivot and transfer. He could feed himself, but needed assistance with all other ADLs.   Patient/Family Understanding of Illness: We reviewed Patrick Franklin's history of a large right MCA stroke in December 2017 that resulted in subsequent seizure disorder, followed by a left MCA stroke in September 2020.  We reviewed that Patrick Franklin is currently critically ill and on mechanical ventilation due to status epilepticus.   Advanced Directives: HCPOA document on file designating sister/Patrick Franklin as health care agent and brother-in-law/Patrick Franklin as alternate MOST form on file (from January 2020) outlining the following scope of treatment: DNR, Limited additional interventions, determine use or limitation of antibiotics when infection occurs, IV fluids for a defined trial period, and no feeding tube.  Additional Discussion: The difference between full scope medical intervention and comfort care was briefly reviewed.  Encouraged family to consider at what point they would want to stop full scope medical interventions, keeping in mind the concept of quality of life. Family states that quality of life is  their main focus in making medical decisions for Patrick Franklin.  For now, they wish to continue full scope interventions and allow time for outcomes with the hope that Patrick Franklin's neurologic status will improve. If there  is not improvement, they seem to indicate they would not want to continue life-prolonging interventions.   We discussed code status. [name] is currently documented as full code, which is not consistent with the MOST form on file stating DNR. Patrick Franklin confirms that Patrick Franklin's wishes are for DNR in the event of cardiac arrest.   Discussed the importance of continued conversation with family and the medical team regarding overall plan of care and treatment options, ensuring decisions are within the context of the patients values and GOCs.   Review of Systems  Unable to perform ROS   Objective:   Primary Diagnoses: Present on Admission:  Acquired skull defect   Physical Exam Vitals reviewed.  Constitutional:      General: He is not in acute distress.    Appearance: He is ill-appearing.  Pulmonary:     Comments: ETT/vent Neurological:     Comments: Minimally responsive     Vital Signs:  BP 106/89   Pulse (!) 112   Temp 99.5 F (37.5 C) (Oral)   Resp (!) 27   Ht 5\' 10"  (1.778 m)   Wt 107.8 kg   SpO2 96%   BMI 34.10 kg/m   Palliative Assessment/Data: PPS 30%     Assessment & Plan:   SUMMARY OF RECOMMENDATIONS   Code status changed to DNR Continue full scope interventions Allow time for outcomes Ongoing palliative support  Primary Decision Maker: HCPOA - sister/Patrick Franklin  Prognosis:  Unable to determine   Thank you for allowing [place] to participate in the care of Patrick Franklin  MDM - High  Signed by: Patrick Foot, NP Palliative Medicine Team  Team Phone # [phone]  For individual providers, please see AMION

## 2022-05-31 NOTE — Progress Notes (Signed)
LTM maint complete -  skin breakdown under:  Fp1 and F8, leads adjusted within range-Nurse notified [facility] monitored, Event button test confirmed by [facility].

## 2022-06-01 ENCOUNTER — Inpatient Hospital Stay (HOSPITAL_COMMUNITY): Payer: Medicare Other

## 2022-06-01 DIAGNOSIS — R6520 Severe sepsis without septic shock: Secondary | ICD-10-CM

## 2022-06-01 DIAGNOSIS — R579 Shock, unspecified: Secondary | ICD-10-CM | POA: Diagnosis not present

## 2022-06-01 DIAGNOSIS — R569 Unspecified convulsions: Secondary | ICD-10-CM | POA: Diagnosis not present

## 2022-06-01 DIAGNOSIS — Z982 Presence of cerebrospinal fluid drainage device: Secondary | ICD-10-CM

## 2022-06-01 DIAGNOSIS — A419 Sepsis, unspecified organism: Secondary | ICD-10-CM

## 2022-06-01 DIAGNOSIS — Z9889 Other specified postprocedural states: Secondary | ICD-10-CM | POA: Diagnosis not present

## 2022-06-01 DIAGNOSIS — G40901 Epilepsy, unspecified, not intractable, with status epilepticus: Secondary | ICD-10-CM

## 2022-06-01 LAB — MAGNESIUM: Magnesium: 2 mg/dL (ref 1.7–2.4)

## 2022-06-01 LAB — BASIC METABOLIC PANEL
Anion gap: 9 (ref 5–15)
BUN: 20 mg/dL (ref 6–20)
CO2: 23 mmol/L (ref 22–32)
Calcium: 7.4 mg/dL — ABNORMAL LOW (ref 8.9–10.3)
Chloride: 107 mmol/L (ref 98–111)
Creatinine, Ser: 0.83 mg/dL (ref 0.61–1.24)
GFR, Estimated: >60 mL/min (ref >60)
Glucose, Bld: 205 mg/dL — ABNORMAL HIGH (ref 70–99)
Potassium: 3.9 mmol/L (ref 3.5–5.1)
Sodium: 139 mmol/L (ref 135–145)

## 2022-06-01 LAB — GLUCOSE, CAPILLARY
Glucose-Capillary: 144 mg/dL — ABNORMAL HIGH (ref 70–99)
Glucose-Capillary: 190 mg/dL — ABNORMAL HIGH (ref 70–99)
Glucose-Capillary: 229 mg/dL — ABNORMAL HIGH (ref 70–99)
Glucose-Capillary: 230 mg/dL — ABNORMAL HIGH (ref 70–99)
Glucose-Capillary: 325 mg/dL — ABNORMAL HIGH (ref 70–99)
Glucose-Capillary: 333 mg/dL — ABNORMAL HIGH (ref 70–99)

## 2022-06-01 LAB — CULTURE, RESPIRATORY W GRAM STAIN

## 2022-06-01 LAB — TRIGLYCERIDES: Triglycerides: 157 mg/dL — ABNORMAL HIGH (ref <150)

## 2022-06-01 LAB — VALPROIC ACID LEVEL: Valproic Acid Lvl: 47 ug/mL — ABNORMAL LOW (ref 50.0–100.0)

## 2022-06-01 MED ORDER — TOPIRAMATE 25 MG PO TABS
150.0000 mg | ORAL_TABLET | Freq: Two times a day (BID) | ORAL | Status: DC
  Administered 2022-06-01 – 2022-06-03 (×4): 150 mg
  Filled 2022-06-01 (×4): qty 6

## 2022-06-01 MED ORDER — POTASSIUM CHLORIDE 20 MEQ PO PACK
40.0000 meq | PACK | Freq: Two times a day (BID) | ORAL | Status: AC
  Administered 2022-06-01 (×2): 40 meq
  Filled 2022-06-01 (×2): qty 2

## 2022-06-01 MED ORDER — INSULIN ASPART 100 UNIT/ML IJ SOLN
3.0000 [IU] | INTRAMUSCULAR | Status: DC
  Administered 2022-06-01 – 2022-06-02 (×6): 3 [IU] via SUBCUTANEOUS

## 2022-06-01 NOTE — Progress Notes (Signed)
Subjective: Patrick Franklin. NO definite seizures. Sister at bedside  ROS: Unable to obtain due to poor mental status  Examination  Vital signs in last 24 hours: Temp:  [98 F (36.7 C)-101 F (38.3 C)] 99.7 F (37.6 C) (05/08 1100) Pulse Rate:  [103-135] 125 (05/08 1000) Resp:  [0-37] 20 (05/08 1000) BP: (93-209)/(62-112) 126/80 (05/08 1000) SpO2:  [95 %-100 %] 100 % (05/08 1107) Arterial Line BP: (97-165)/(63-106) 99/67 (05/08 1000) FiO2 (%):  [30 %] 30 % (05/08 1107)  General: lying in bed, intubated and sedated on propofol and Versed Neuro: comatose, winces but does not open eyes to noxious stimuli, PERRLA, corneal reflex present, left gaze preference, doesn't cross midline with oculocephalics, cough reflex present, withdraws to noxious stimulation in all extremities    Basic Metabolic Panel: Recent Labs  Lab 05/25/22 1449 05/25/22 2209 05/26/22 0201 05/26/22 0218 05/27/22 0546 05/27/22 0735 05/28/22 4098 05/30/22 0647 05/31/22 0353 06/01/22 0349  NA  --    < >  --    < > 145 147* 144 145 142 139  K  --    < >  --    < > 3.6 3.5 3.4* 3.8 3.6 3.9  CL  --    < >  --   --  115*  --  112* 113* 112* 107  CO2  --    < >  --   --  23  --  23 23 21* 23  GLUCOSE  --    < >  --   --  184*  --  141* 202* 171* 205*  BUN  --    < >  --   --  18  --  18 15 15 20   CREATININE  --    < >  --   --  0.88  --  0.77 0.76 0.70 0.83  CALCIUM  --    < >  --   --  7.3*  --  7.6* 7.3* 7.2* 7.4*  MG 1.7   < > 2.6*  --  2.0  --  1.9 1.9  --  2.0  PHOS 2.9  --  1.3*  --  2.0*  --  2.0* 3.9  --   --    < > = values in this interval not displayed.    CBC: Recent Labs  Lab 05/26/22 0201 05/26/22 0218 05/27/22 1620 05/28/22 0635 05/29/22 0236 05/30/22 0647 05/31/22 0353  WBC 10.5  --  3.7* 5.1 5.2 6.3 7.0  NEUTROABS 5.0  --   --  2.9  --  2.5  --   HGB 8.2*   < > 9.7* 10.2* 9.7* 9.9* 10.9*  HCT 24.3*   < > 28.1* 29.4* 29.1* 29.7* 33.1*  MCV 92.7  --  91.5 90.5 92.4 94.0 94.0  PLT 114*  --  46*  51* 61* 79* 105*   < > = values in this interval not displayed.     Coagulation Studies: No results for input(s): "LABPROT", "INR" in the last 72 hours.  Imaging No new brain imaging     ASSESSMENT AND PLAN: 52 year old male admitted for cranioplasty revision on 4/30.  High-frequency hearing after reducing sedation arising from left occipital region.   Status epilepticus -No definite seizures on EEG but continues to have LPDs in left occipital region which at times appear rhythmic.   Recommendations -Increase topiramate to 150 mg twice daily -Continue Permapnel 2mg  QHS, Keppra 2000mg  BID, Clonazepam to 0.5mg  TID, Vimpat 200 mg  twice daily, Depakote 1000 mg twice daily - Will check depakote level -If seizures recur, can increase perampanel to 4 mg and increase Depakote depending on Depakote level -Will wait for 72 hours since stopping sedation (till Friday) to look for improvement in mental status.  Discussed with patient sister at bedside that if mental status does not improve, may consider repeating CT head or obtaining an MRI brain to look for any other acute abnormalities. - Continue seizure precautions - PRN IV versed for clinical seizures -Discussed plan with family at bedside as well as ICU team   I have spent a total of  36 minutes with the patient reviewing hospital notes,  test results, labs and examining the patient as well as establishing an assessment and plan.  > 50% of time was spent in direct patient care.     Lindie Spruce Epilepsy [facility] For questions after 5pm please refer to AMION to reach the Neurologist on call

## 2022-06-01 NOTE — Progress Notes (Signed)
NAME:  Patrick Franklin, MRN:  [id number], DOB:  1970-07-21, LOS: 8 ADMISSION DATE:  05/16/2022, CONSULTATION DATE: 04/25/2022 REFERRING MD: Dr. Julio Sicks, CHIEF COMPLAINT: Altered mental status  History of Present Illness:  52 year old male with hypertension, hyperlipidemia and prior right MCA stroke, complicated with malignant cerebral edema requiring decompressive craniectomy, underwent cranioplasty and VP shunt, who presented with increasing headache, noted to have cranioplasty failure, today he underwent cranioplasty again, upon arrival to ICU patient was not responding, he had forced left gaze deviation, he became hypotensive, hypoxic and started gurgling.  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), Asthma, Cardiomegaly (04/13/2016), Cerebral embolism with cerebral infarction (10/03/2018), DVT (deep venous thrombosis) (HCC), Dysphagia following other cerebrovascular disease, Elevated serum creatinine, Essential hypertension (05/15/2017), GERD without esophagitis, Headache, Hemiparesis affecting left side as late effect of stroke (08/09/2017), History of ischemic right MCA stroke (2017), History of left MCA stroke (2020), History of renal stone (08/22/2017), Homonymous bilateral field defects, Hyperlipidemia (05/15/2017), 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   Significant Hospital Events: Including procedures, antibiotic start and stop dates in addition to other pertinent events   5/6 weaning sedation while on EEG,  remains intubated  Interim History / Subjective:  No definite seizures on EEG yesterday, but continues to have LPDs Propofol off yesterday and versed has been weaned off overnight.  Remains on 0.4 precedex.  LTM EEG ongoing Continues with fever 101F No sz overnight  Objective   Blood pressure 139/85, pulse (!) 117, temperature (!) 101 F (38.3 C), temperature source Axillary, resp. rate 20, height 5\' 10"  (1.778 m), weight 107.8 kg, SpO2 100 %. CVP:  [29 mmHg] 29 mmHg  Vent Mode: PRVC FiO2 (%):  [30 %] 30 % Set Rate:  [20 bmp] 20 bmp Vt Set:  [580 mL] 580 mL PEEP:  [5 cmH20] 5 cmH20 Plateau Pressure:  [14 cmH20-23 cmH20] 14 cmH20   Intake/Output Summary (Last 24 hours) at 06/01/2022 4098 Last data filed at 06/01/2022 0601 Gross per 24 hour  Intake 2302.07 ml  Output 4675 ml  Net -2372.93 ml    Filed Weights   05/06/2022 0854 05/26/22 0500 05/31/22 1191  Weight: 83 kg 89.2 kg 107.8 kg     Physical exam:  General: middle aged adult male in NAD on vnet HEENT: S/p cranioplasty with staples in place on R side, eyes anicteric. Neuro: Sedated, not following commands.  Eyes are closed.  Cough and gag present. Pupils 5mm and briskly reactive to light. Flicker in bilateral lower extremity. LUE tremor to pain. RUE weak tremor to pain.  Chest: Bibasilar crackles no wheezes or rhonchi. Moderate purulent appearing ET secretions.  Heart: RRR, no MRG Abdomen: Soft, nondistended, bowel sounds present Skin: No rash  Labs and images were reviewed  Resolved Hospital Problem list   Acute kidney injury Propofol induced hypertriglyceridemia Lactic acidosis due to recurrent seizures/septic shock Hiccups Bleeding from gums and IV puncture site  Assessment & Plan:  Status epilepticus  Acute encephalopathy due to recurrent seizures Sedative drips weaned off overnight.  EEG read is pending Neurology, epileptology are following Continue Keppra, Topamax, Klonopin and Depakote  Perampanel started  5/7 If SE remains refractory to current treatment  there are limited options.   Acute hypoxic respiratory failure Severe sepsis with septic shock due to bilateral multifocal pneumonia Started spiking fever again with Tmax 101 Unable to remove foley due to scrotal edema Unable to remove CVL due to anasarca, limited PIV sites.  Cultures remain negative BAL pending Continue lung protective ventilation Continue titrate FiO2 and PEEP with O2 sat goal 92% HOB elevated 30 degrees. Apneic on SBT  Prior right MCA stroke s/p craniectomy and now with cranioplasty Hydrocephalus s/p VP shunt Postop management defer to neurosurgery Shunt setting was changed by neurosurgery during this visit  Acute blood loss anemia perioperatively Patient hemoglobin dropped down to 7.5 from 13 upon admission, part of this is dilutional in part due to blood loss No signs of active bleeding H&H remained stable  Thrombocytopenia critical illness Platelets are improving -monitor CBC and watch for signs of bleeding  Hypokalemia/hypophosphatemia Continue aggressive electrolyte supplement and monitor  Anasarca Patient received lots of IV fluid, he is net +20 L since admission Lasix 40 mg twice daily KCL BID x 2  Best Practice (right click and "Reselect all SmartList Selections" daily)   Diet/type: NPO tube feeds DVT prophylaxis: Subcu Lovenox GI prophylaxis: PPI Lines: NA Foley:  Yes, still needed Code Status:  full code Last date of multidisciplinary goals of care discussion [5/8: Patient's family was updated at bedside]  Labs   CBC: Recent Labs  Lab 05/26/22 0201 05/26/22 0218 05/27/22 1620 05/28/22 0635 05/29/22 0236 05/30/22 0647 05/31/22 0353  WBC 10.5  --  3.7* 5.1 5.2 6.3 7.0  NEUTROABS 5.0  --   --  2.9  --  2.5  --   HGB 8.2*   < > 9.7* 10.2* 9.7* 9.9* 10.9*  HCT 24.3*   < > 28.1* 29.4* 29.1* 29.7* 33.1*  MCV 92.7  --  91.5 90.5 92.4 94.0 94.0  PLT 114*  --  46* 51* 61* 79* 105*   <  > = values in this interval not displayed.     Basic Metabolic Panel: Recent Labs  Lab 05/25/22 1449 05/25/22 2209 05/26/22 0201 05/26/22 0218 05/27/22 0546 05/27/22 0735 05/28/22 1610 05/30/22 0647 05/31/22 0353 06/01/22 0349  NA  --    < >  --    < > 145 147* 144 145 142 139  K  --    < >  --    < > 3.6 3.5 3.4* 3.8 3.6 3.9  CL  --    < >  --   --  115*  --  112* 113* 112* 107  CO2  --    < >  --   --  23  --  23 23 21* 23  GLUCOSE  --    < >  --   --  184*  --  141* 202* 171* 205*  BUN  --    < >  --   --  18  --  18 15 15 20   CREATININE  --    < >  --   --  0.88  --  0.77 0.76 0.70 0.83  CALCIUM  --    < >  --   --  7.3*  --  7.6* 7.3* 7.2* 7.4*  MG 1.7   < > 2.6*  --  2.0  --  1.9 1.9  --  2.0  PHOS 2.9  --  1.3*  --  2.0*  --  2.0* 3.9  --   --    < > =  values in this interval not displayed.    GFR: Estimated Creatinine Clearance: 130.9 mL/min (by C-G formula based on SCr of 0.83 mg/dL). Recent Labs  Lab 05/26/22 0508 05/26/22 1627 05/26/22 1834 05/27/22 1620 05/28/22 0635 05/29/22 0236 05/30/22 0647 05/31/22 0353  PROCALCITON  --  2.20  --   --   --   --   --   --   WBC  --   --   --    < > 5.1 5.2 6.3 7.0  LATICACIDVEN 2.8* 3.7* 3.5*  --   --   --   --  1.1   < > = values in this interval not displayed.     Liver Function Tests: Recent Labs  Lab 05/28/22 0635  AST 19  ALT 11  ALKPHOS 55  BILITOT 0.5  PROT 4.0*  ALBUMIN 1.8*    No results for input(s): "LIPASE", "AMYLASE" in the last 168 hours. Recent Labs  Lab 05/27/22 1620  AMMONIA 38*     ABG    Component Value Date/Time   PHART 7.337 (L) 05/27/2022 0735   PCO2ART 41.5 05/27/2022 0735   PO2ART 127 (H) 05/27/2022 0735   HCO3 22.2 05/27/2022 0735   TCO2 23 05/27/2022 0735   ACIDBASEDEF 3.0 (H) 05/27/2022 0735   O2SAT 99 05/27/2022 0735     Coagulation Profile: Recent Labs  Lab 05/29/22 0236  INR 1.1     Cardiac Enzymes: No results for input(s): "CKTOTAL", "CKMB",  "CKMBINDEX", "TROPONINI" in the last 168 hours.  HbA1C: Hgb A1c MFr Bld  Date/Time Value Ref Range Status  10/02/2018 03:33 AM 5.2 4.8 - 5.6 % Final    Comment:    (NOTE)         Prediabetes: 5.7 - 6.4         Diabetes: >6.4         Glycemic control for adults with diabetes: <7.0     CBG: Recent Labs  Lab 05/31/22 1530 05/31/22 1951 05/31/22 2351 06/01/22 0338 06/01/22 0728  GLUCAP 192* 273* 327* 190* 144*     Critical care time: 38 minutes     Joneen Roach, AGACNP-BC [facility]  See Amion for personal pager PCCM on call pager [phone] until 7pm. Please call Elink 7p-7a. [phone]  06/01/2022 8:27 AM

## 2022-06-01 NOTE — Progress Notes (Signed)
Nutrition Follow-up  DOCUMENTATION CODES:   Not applicable  INTERVENTION:   Tube feeding via Cortrak tube: Osmolite 1.5 @ 55 ml/hr (1320 ml per day) Prosource TF20 60 ml BID  Provides 2140 kcal, 122 gm protein, 1003 ml free water daily  Continue MVI with minerals   Pt is a candidate for PEPuP   Discussed high blood sugars with Pharmacy who will add TF coverage   NUTRITION DIAGNOSIS:   Inadequate oral intake related to inability to eat as evidenced by NPO status. Ongoing   GOAL:   Patient will meet greater than or equal to 90% of their needs Met with TF at goal   MONITOR:   TF tolerance  REASON FOR ASSESSMENT:   Consult Enteral/tube feeding initiation and management  ASSESSMENT:   Pt with PMH of HTN, HLD, spastic hemiplegia of L side, and prior R MCA stroke which required decompressive craniectomy s/p cranioplasty and VP shunt now admitted with cranioplasty failure s/p revision of R fronto temporoparietal cranioplasty and developed post op seizures.    Pt discussed during ICU rounds and with RN and MD. Pt remains intubated.  Sedation stopped, remains on cEEG.  Noted elevated blood sugars; spoke with Pharmacy for TF coverage.  Weight up with anasarca.  +fevers   Per weight hx review pt has lost 11.3 kg over the last year, 12% weight loss - per sister likely over the last 4-5 months.  Spoke with dad and mom (who live in [place]) and sister. Per sister pt has lived in a SNF for the last 4 years after a stroke in 2020.  Pt has been losing weight over the last 4-5 months, pt reports decreased appetite and depression PTA causing weight loss.   04/30 - s/p revision of R frontotemporoparietal cranioplasty  05/08 - s/p cortrak placement; tip in distal stomach per xray  Medications reviewed and include: lasix, SSI, 5 units semglee BID, MVI with minerals, protonix, miralax, 40 mEq KCl BID Precedex  Labs reviewed:  PO4 2.9 --> 1.3 --> 2.0 --> 2.0 --> 3.9 TG: 157 CBG's:  144-327  UOP: 4675 ml (on lasix)   Current weight: 107.8 kg (++edema) Admission weight: 83 kg  UBW: 97.7 kg   NUTRITION - FOCUSED PHYSICAL EXAM:  Flowsheet Row Most Recent Value  Orbital Region No depletion  Upper Arm Region No depletion  Thoracic and Lumbar Region No depletion  Buccal Region No depletion  Temple Region No depletion  Clavicle Bone Region No depletion  Clavicle and Acromion Bone Region No depletion  Scapular Bone Region No depletion  Dorsal Hand No depletion  Patellar Region Unable to assess  Anterior Thigh Region Unable to assess  Posterior Calf Region Unable to assess  Edema (RD Assessment) Severe  Hair Reviewed  Eyes Unable to assess  Mouth Unable to assess  Skin Reviewed  Nails Reviewed        Diet Order:   Diet Order     None       EDUCATION NEEDS:   No education needs have been identified at this time  Skin:  Skin Assessment: Reviewed RN Assessment (head incision)  Last BM:  5/7 x 3 med-large, FMS inserted  Height:   Ht Readings from Last 1 Encounters:  05/24/22 5\' 10"  (1.778 m)    Weight:   Wt Readings from Last 1 Encounters:  06/01/22 105 kg    BMI:  Body mass index is 33.21 kg/m.  Estimated Nutritional Needs:   Kcal:  2100-2400  Protein:  100-125 grams  Fluid:  >2 L/day  Cammy Copa., RD, LDN, CNSC See AMiON for contact information

## 2022-06-01 NOTE — Progress Notes (Addendum)
No new issues or problems.  No seizures overnight.  Patient grimaces to noxious stimuli.  Some flexion of his upper extremities.  Nothing to command but he remains sedated.  Wound healing well.  Will check follow-up head CT scan in morning.  Continue supportive efforts.  Hopefully be able to stop sedation soon.

## 2022-06-01 NOTE — Procedures (Signed)
Cortrak  Person Inserting Tube:  Trayonna Bachmeier L, RD Tube Type:  Cortrak - 43 inches Tube Size:  10 Tube Location:  Left nare Secured by: Bridle Technique Used to Measure Tube Placement:  Marking at nare/corner of mouth Cortrak Secured At:  75 cm  Cortrak Tube Team Note:  Consult received to place a Cortrak feeding tube.   X-ray is required, abdominal x-ray has been ordered by the Cortrak team. Please confirm tube placement before using the Cortrak tube.   If the tube becomes dislodged please keep the tube and contact the Cortrak team at [email or web address] for replacement.  If after hours and replacement cannot be delayed, place a NG tube and confirm placement with an abdominal x-ray.    Jaydn Moscato RD, LDN Clinical Dietitian See AMiON for contact information.    

## 2022-06-01 NOTE — Progress Notes (Signed)
LTM maint complete - no additional skin breakdown , previous tech note Fp1 and F8 had breakdown/irritation. Several lead wells re prepped. Impedance below 10kOhms.

## 2022-06-01 NOTE — Progress Notes (Signed)
LTM maint complete - no skin breakdown under: C4.  F8 mild redness.

## 2022-06-01 NOTE — Procedures (Signed)
Patient Name: Patrick Franklin  MRN: [id number]  Epilepsy Attending: Charlsie Quest  Referring Physician/Provider: Cheri Fowler, MD  Duration: 05/31/2022 1100 to 06/01/2022 1100   Patient history: 52 year old male with hypertension, hyperlipidemia and prior right MCA stroke, complicated with malignant cerebral edema requiring decompressive craniectomy, underwent cranioplasty and VP shunt, who presented with increasing headache, noted to have cranioplasty failure, today he underwent cranioplasty again, upon arrival to ICU patient was not responding, he had forced left gaze deviation, he became hypotensive, hypoxic and started gurgling. EEG to evaluate for seizure   Level of alertness:  lethargic /sedated   AEDs during EEG study: LCM, TPM, VPA, LEV, Clonazepam, Perampanel   Technical aspects: This EEG study was done with scalp electrodes positioned according to the 10-20 International system of electrode placement. Electrical activity was reviewed with band pass filter of 1-70Hz , sensitivity of 7 uV/mm, display speed of 63mm/sec with a 60Hz  notched filter applied as appropriate. EEG data were recorded continuously and digitally stored.  Video monitoring was available and reviewed as appropriate.   Description: EEG showed continuous generalized and lateralized right hemisphere low amplitude 2-3hz  delta slowing. Lateralized periodic discharges were noted arising from left hemisphere, maximal left occipital region with fluctuating frequency of 0.5 to 2 Hz at times with overriding rhythmicity ( LPD+R), lasting 4-10 seconds.    ABNORMALITY - Lateralized periodic discharges with overriding rhythmicity, left hemisphere, maximal left occipital region  ( LPD+R) - Continuous slow, generalized and lateralized right hemisphere    IMPRESSION: This study showed evidence of epileptogenicity arising from left hemisphere, maximal left occipital region which is on the ictal-interictal continuum with increased risk  of seizures. Additionally there was profound diffuse encephalopathy, likely related to sedation.  No definite seizures were noted.   Iridian Reader Annabelle Harman

## 2022-06-01 NOTE — Progress Notes (Signed)
Orthopedic Tech Progress Note Patient Details:  Patrick Franklin 1970-10-19 [id number]  Ortho Devices Type of Ortho Device: Prafo boot/shoe Ortho Device/Splint Location: BLE Ortho Device/Splint Interventions: Ordered, Application, Adjustment   Post Interventions Patient Tolerated: Well Instructions Provided: Care of device  Donald Pore 06/01/2022, 3:51 PM

## 2022-06-02 ENCOUNTER — Inpatient Hospital Stay (HOSPITAL_COMMUNITY): Payer: Medicare Other

## 2022-06-02 DIAGNOSIS — R6521 Severe sepsis with septic shock: Secondary | ICD-10-CM

## 2022-06-02 DIAGNOSIS — R569 Unspecified convulsions: Secondary | ICD-10-CM | POA: Diagnosis not present

## 2022-06-02 DIAGNOSIS — Z982 Presence of cerebrospinal fluid drainage device: Secondary | ICD-10-CM | POA: Diagnosis not present

## 2022-06-02 DIAGNOSIS — J9601 Acute respiratory failure with hypoxia: Secondary | ICD-10-CM

## 2022-06-02 DIAGNOSIS — Z9889 Other specified postprocedural states: Secondary | ICD-10-CM | POA: Diagnosis not present

## 2022-06-02 DIAGNOSIS — A419 Sepsis, unspecified organism: Secondary | ICD-10-CM | POA: Diagnosis not present

## 2022-06-02 DIAGNOSIS — G40901 Epilepsy, unspecified, not intractable, with status epilepticus: Secondary | ICD-10-CM | POA: Diagnosis not present

## 2022-06-02 LAB — CBC
HCT: 30.7 % — ABNORMAL LOW (ref 39.0–52.0)
Hemoglobin: 10 g/dL — ABNORMAL LOW (ref 13.0–17.0)
MCH: 31.1 pg (ref 26.0–34.0)
MCHC: 32.6 g/dL (ref 30.0–36.0)
MCV: 95.3 fL (ref 80.0–100.0)
Platelets: 139 10*3/uL — ABNORMAL LOW (ref 150–400)
RBC: 3.22 MIL/uL — ABNORMAL LOW (ref 4.22–5.81)
RDW: 14.5 % (ref 11.5–15.5)
WBC: 8.3 10*3/uL (ref 4.0–10.5)
nRBC: 0.7 % — ABNORMAL HIGH (ref 0.0–0.2)

## 2022-06-02 LAB — BASIC METABOLIC PANEL
Anion gap: 7 (ref 5–15)
BUN: 20 mg/dL (ref 6–20)
CO2: 24 mmol/L (ref 22–32)
Calcium: 7.7 mg/dL — ABNORMAL LOW (ref 8.9–10.3)
Chloride: 108 mmol/L (ref 98–111)
Creatinine, Ser: 0.74 mg/dL (ref 0.61–1.24)
GFR, Estimated: >60 mL/min (ref >60)
Glucose, Bld: 237 mg/dL — ABNORMAL HIGH (ref 70–99)
Potassium: 3.8 mmol/L (ref 3.5–5.1)
Sodium: 139 mmol/L (ref 135–145)

## 2022-06-02 LAB — GLUCOSE, CAPILLARY
Glucose-Capillary: 132 mg/dL — ABNORMAL HIGH (ref 70–99)
Glucose-Capillary: 151 mg/dL — ABNORMAL HIGH (ref 70–99)
Glucose-Capillary: 174 mg/dL — ABNORMAL HIGH (ref 70–99)
Glucose-Capillary: 176 mg/dL — ABNORMAL HIGH (ref 70–99)
Glucose-Capillary: 195 mg/dL — ABNORMAL HIGH (ref 70–99)
Glucose-Capillary: 253 mg/dL — ABNORMAL HIGH (ref 70–99)

## 2022-06-02 LAB — PHOSPHORUS: Phosphorus: 3.3 mg/dL (ref 2.5–4.6)

## 2022-06-02 LAB — MAGNESIUM: Magnesium: 2 mg/dL (ref 1.7–2.4)

## 2022-06-02 LAB — AMMONIA: Ammonia: 27 umol/L (ref 9–35)

## 2022-06-02 MED ORDER — POTASSIUM CHLORIDE 20 MEQ PO PACK
40.0000 meq | PACK | Freq: Two times a day (BID) | ORAL | Status: DC
  Administered 2022-06-02 – 2022-06-06 (×10): 40 meq
  Filled 2022-06-02 (×10): qty 2

## 2022-06-02 MED ORDER — INSULIN GLARGINE-YFGN 100 UNIT/ML ~~LOC~~ SOLN
8.0000 [IU] | Freq: Two times a day (BID) | SUBCUTANEOUS | Status: DC
  Administered 2022-06-02 – 2022-06-06 (×9): 8 [IU] via SUBCUTANEOUS
  Filled 2022-06-02 (×11): qty 0.08

## 2022-06-02 MED ORDER — BANATROL TF EN LIQD
60.0000 mL | Freq: Two times a day (BID) | ENTERAL | Status: DC
  Administered 2022-06-02 – 2022-06-06 (×10): 60 mL
  Filled 2022-06-02 (×10): qty 60

## 2022-06-02 MED ORDER — INSULIN ASPART 100 UNIT/ML IJ SOLN
5.0000 [IU] | INTRAMUSCULAR | Status: DC
  Administered 2022-06-02 – 2022-06-07 (×27): 5 [IU] via SUBCUTANEOUS

## 2022-06-02 NOTE — Progress Notes (Signed)
Subjective: Has had multiple episodes this morning where briefly his blood pressure and heart rate go up, eyes appear to be opening with bilateral upper extremity flexion, increased tone and tremor-like movements.  Movements last for few seconds only and does respond to noxious stimulation with wincing during the movements per RN.  Sister and daughter at bedside.  ROS: Unable to obtain due to poor mental status  Examination  Vital signs in last 24 hours: Temp:  [98.1 F (36.7 C)-100.3 F (37.9 C)] 100.1 F (37.8 C) (05/09 1000) Pulse Rate:  [114-148] 143 (05/09 1122) Resp:  [0-30] 26 (05/09 1122) BP: (106-171)/(57-127) 124/82 (05/09 1122) SpO2:  [94 %-100 %] 100 % (05/09 1122) Arterial Line BP: (98-171)/(61-107) 110/71 (05/09 0900) FiO2 (%):  [30 %] 30 % (05/09 1122) Weight:  [102.9 kg] 102.9 kg (05/09 0420)  General: lying in bed, intubated and sedated on propofol and Versed Neuro: comatose, winces but does not open eyes to noxious stimuli, PERRLA, corneal reflex present, no gaze deviation, cough reflex present, withdraws to noxious stimulation in all extremities with antigravity strength in right upper extremity   Basic Metabolic Panel: Recent Labs  Lab 05/27/22 0546 05/27/22 0735 05/28/22 0635 05/30/22 0647 05/31/22 0353 06/01/22 0349 06/02/22 0422  NA 145   < > 144 145 142 139 139  K 3.6   < > 3.4* 3.8 3.6 3.9 3.8  CL 115*  --  112* 113* 112* 107 108  CO2 23  --  23 23 21* 23 24  GLUCOSE 184*  --  141* 202* 171* 205* 237*  BUN 18  --  18 15 15 20 20   CREATININE 0.88  --  0.77 0.76 0.70 0.83 0.74  CALCIUM 7.3*  --  7.6* 7.3* 7.2* 7.4* 7.7*  MG 2.0  --  1.9 1.9  --  2.0 2.0  PHOS 2.0*  --  2.0* 3.9  --   --  3.3   < > = values in this interval not displayed.    CBC: Recent Labs  Lab 05/28/22 0635 05/29/22 0236 05/30/22 0647 05/31/22 0353 06/02/22 0422  WBC 5.1 5.2 6.3 7.0 8.3  NEUTROABS 2.9  --  2.5  --   --   HGB 10.2* 9.7* 9.9* 10.9* 10.0*  HCT 29.4* 29.1*  29.7* 33.1* 30.7*  MCV 90.5 92.4 94.0 94.0 95.3  PLT 51* 61* 79* 105* 139*     Coagulation Studies: No results for input(s): "LABPROT", "INR" in the last 72 hours.  Imaging CT head without contrast 06/13/2022: Decreased density of right convexity and right parafalcine blood products. No new site of hemorrhage. Decreased size of the shunted lateral ventricles.  ASSESSMENT AND PLAN: 52 year old male admitted for cranioplasty revision on 4/30.  High-frequency hearing after reducing sedation arising from left occipital region.   Status epilepticus -No definite seizures on EEG but continues to have LPDs in left occipital region which at times appear rhythmic, more when awake/stimulated.   Recommendations -As no definite EEG correlate was seen, the episodes this morning could be due to arousal.  However, I will check ammonia and if not elevated, there is room to increase Depakote in case these episodes persist or we see definite seizures on EEG -Continue Keppra 2000mg  BID, Clonazepam to 0.5mg  TID, Vimpat 200 mg twice daily, Depakote 1000 mg twice daily, perampanel 2 mg nightly and topiramate 150 mg twice daily - Will check depakote level -If seizures recur, can increase perampanel to 4 mg and increase Depakote depending on ammonia -Mental  status appears to be gradually improving (more wincing with noxious stimulation, withdrawal in all extremities with noxious stimulation).  However, still is not opening his eyes or following any commands.  Will continue to monitor. - Continue seizure precautions - PRN IV versed for clinical seizures -Discussed plan with family at bedside as well as ICU team    I have spent a total of  38 minutes with the patient reviewing hospital notes,  test results, labs and examining the patient as well as establishing an assessment and plan.  > 50% of time was spent in direct patient care.    Lindie Spruce Epilepsy [facility] For questions after 5pm  please refer to AMION to reach the Neurologist on call

## 2022-06-02 NOTE — Progress Notes (Addendum)
Noted on patient monitor, that patient's HR was elevated to the 140's-150''s range and patient BP per art line elevated into 190's systolic. Patient very tense with arms drawing up non-purposefully toward body. RN pressed button on EEG and obtained versed, but patient BP and HR returned to normal and patient relaxed and drowsy. Versed held and NP notified.   1015 -- Patient continuing to have similar episodes every 10 minutes or so, with episodes lasting only a few seconds before patient returns to baseline. Patient's pupils now 5 and not reactive to light. NP Joneen Roach at bedside.   1300 -- Patient continuing to have transient episodes. Patient's pupils dilated and non-reactive during episodes but become reactive shortly after. Episodes last a few seconds before resolving. Will not administer prn versed unless episodes become prolonged per Dr. Melynda Ripple.

## 2022-06-02 NOTE — Progress Notes (Signed)
NAME:  Patrick Franklin, MRN:  [id number], DOB:  1970/12/06, LOS: 9 ADMISSION DATE:  04/30/2022, CONSULTATION DATE: 05/18/2022 REFERRING MD: Dr. Julio Sicks, CHIEF COMPLAINT: Altered mental status  History of Present Illness:  52 year old male with hypertension, hyperlipidemia and prior right MCA stroke, complicated with malignant cerebral edema requiring decompressive craniectomy, underwent cranioplasty and VP shunt, who presented with increasing headache, noted to have cranioplasty failure, today he underwent cranioplasty again, upon arrival to ICU patient was not responding, he had forced left gaze deviation, he became hypotensive, hypoxic and started gurgling.  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), Asthma, Cardiomegaly (04/13/2016), Cerebral embolism with cerebral infarction (10/03/2018), DVT (deep venous thrombosis) (HCC), Dysphagia following other cerebrovascular disease, Elevated serum creatinine, Essential hypertension (05/15/2017), GERD without esophagitis, Headache, Hemiparesis affecting left side as late effect of stroke (08/09/2017), History of ischemic right MCA stroke (2017), History of left MCA stroke (2020), History of renal stone (08/22/2017), Homonymous bilateral field defects, Hyperlipidemia (05/15/2017), 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   Significant Hospital Events: Including procedures, antibiotic start and stop dates in addition to other pertinent events   5/6 weaning sedation while on EEG,  remains intubated 5/9 Sedation off. Starting to open eyes and trigger vent.   Interim History / Subjective:  Starting to open eyes and breath spontaneously Not yet following commands Remains markedly positive volume status despite diuresis.   Objective   Blood pressure 129/88, pulse (!) 133, temperature 99.8 F (37.7 C), temperature source Axillary, resp. rate (!) 24, height 5\' 10"  (1.778 m), weight 102.9 kg, SpO2 100 %.    Vent Mode: PRVC FiO2 (%):  [30 %] 30 % Set Rate:  [20 bmp] 20 bmp Vt Set:  [580 mL] 580 mL PEEP:  [5 cmH20] 5 cmH20 Plateau Pressure:  [17 cmH20-22 cmH20] 18 cmH20   Intake/Output Summary (Last 24 hours) at 06/02/2022 0803 Last data filed at 06/02/2022 0700 Gross per 24 hour  Intake 1961.25 ml  Output 3360 ml  Net -1398.75 ml    Filed Weights   05/31/22 0712 06/01/22 0708 06/02/22 0420  Weight: 107.8 kg 105 kg 102.9 kg     Physical exam:  General: middle aged adult male on vent.  HEENT: S/p cranioplasty with staples in place on R side, eyes anicteric.  Neuro: Sedated. Eyes open to voice. Not following commands.  Cough and gag present. Not following commands.  Chest: Clear bilateral breath sounds.  Heart: RRR, no MRG Abdomen: Soft, NT, hypoactive.  Skin: Grossly intact.    Resolved Hospital Problem list   Acute kidney injury Propofol induced hypertriglyceridemia Lactic acidosis due to recurrent seizures/septic shock Hiccups Bleeding from gums and IV puncture site  Assessment & Plan:  Status epilepticus  Acute encephalopathy due to recurrent seizures Sedative drips weaned x 36 hours EEG read is pending for today Neurology, epileptology are following Continue Keppra, Topamax, Klonopin and Depakote  Perampanel started 5/7 > can increase to 4mg  if sz recur  If SE remains refractory to current treatment there are limited options.   Acute hypoxic respiratory failure Severe sepsis with septic shock due to bilateral multifocal pneumonia Finally  afebrile overnight Unable to remove foley due to scrotal  edema Unable to remove CVL due to anasarca, limited PIV sites. Consider PICC Cultures remain negative BAL pending Continue lung protective ventilation Continue titrate FiO2 and PEEP with O2 sat goal 92% HOB elevated 30 degrees. Triggering vent on 12/5 today, but tachypneic. Will only offer a short SBT today.   Prior right MCA stroke s/p craniectomy and now with cranioplasty Hydrocephalus s/p VP shunt Postop management defer to neurosurgery Shunt setting was changed by neurosurgery during this visit  Acute blood loss anemia perioperatively Patient hemoglobin dropped down to 7.5 from 13 upon admission, part of this is dilutional in part due to blood loss No signs of active bleeding H&H remained stable  Thrombocytopenia critical illness Platelets are improving -monitor CBC and watch for signs of bleeding  Hypokalemia/hypophosphatemia Continue aggressive electrolyte supplement and monitor  Anasarca Patient received lots of IV fluid, he is net +16L since admission Lasix 40 mg twice daily KCL 40 meq BID   Best Practice (right click and "Reselect all SmartList Selections" daily)   Diet/type: NPO tube feeds DVT prophylaxis: Subcu Lovenox GI prophylaxis: PPI Lines: NA Foley:  Yes, still needed Code Status:  full code Last date of multidisciplinary goals of care discussion [5/9: Patient's family was updated at bedside]  Labs   CBC: Recent Labs  Lab 05/28/22 0635 05/29/22 0236 05/30/22 0647 05/31/22 0353 06/02/22 0422  WBC 5.1 5.2 6.3 7.0 8.3  NEUTROABS 2.9  --  2.5  --   --   HGB 10.2* 9.7* 9.9* 10.9* 10.0*  HCT 29.4* 29.1* 29.7* 33.1* 30.7*  MCV 90.5 92.4 94.0 94.0 95.3  PLT 51* 61* 79* 105* 139*     Basic Metabolic Panel: Recent Labs  Lab 05/27/22 0546 05/27/22 0735 05/28/22 0635 05/30/22 0647 05/31/22 0353 06/01/22 0349 06/02/22 0422  NA 145   < > 144 145 142 139 139  K 3.6   < > 3.4* 3.8 3.6 3.9  3.8  CL 115*  --  112* 113* 112* 107 108  CO2 23  --  23 23 21* 23 24  GLUCOSE 184*  --  141* 202* 171* 205* 237*  BUN 18  --  18 15 15 20 20   CREATININE 0.88  --  0.77 0.76 0.70 0.83 0.74  CALCIUM 7.3*  --  7.6* 7.3* 7.2* 7.4* 7.7*  MG 2.0  --  1.9 1.9  --  2.0 2.0  PHOS 2.0*  --  2.0* 3.9  --   --  3.3   < > = values in this interval not displayed.    GFR: Estimated Creatinine Clearance: 132.8 mL/min (by C-G formula based on SCr of 0.74 mg/dL). Recent Labs  Lab 05/26/22 1627 05/26/22 1834 05/27/22 1620 05/29/22 0236 05/30/22 0647 05/31/22 0353 06/02/22 0422  PROCALCITON 2.20  --   --   --   --   --   --   WBC  --   --    < > 5.2 6.3 7.0 8.3  LATICACIDVEN 3.7* 3.5*  --   --   --  1.1  --    < > = values in this interval not displayed.     Liver Function Tests: Recent Labs  Lab 05/28/22 0635  AST 19  ALT 11  ALKPHOS 55  BILITOT 0.5  PROT 4.0*  ALBUMIN 1.8*    No results for input(s): "LIPASE", "AMYLASE" in the last 168 hours. Recent Labs  Lab 05/27/22 1620  AMMONIA 38*     ABG    Component Value  Date/Time   PHART 7.337 (L) 05/27/2022 0735   PCO2ART 41.5 05/27/2022 0735   PO2ART 127 (H) 05/27/2022 0735   HCO3 22.2 05/27/2022 0735   TCO2 23 05/27/2022 0735   ACIDBASEDEF 3.0 (H) 05/27/2022 0735   O2SAT 99 05/27/2022 0735     Coagulation Profile: Recent Labs  Lab 05/29/22 0236  INR 1.1     Cardiac Enzymes: No results for input(s): "CKTOTAL", "CKMB", "CKMBINDEX", "TROPONINI" in the last 168 hours.  HbA1C: Hgb A1c MFr Bld  Date/Time Value Ref Range Status  10/02/2018 03:33 AM 5.2 4.8 - 5.6 % Final    Comment:    (NOTE)         Prediabetes: 5.7 - 6.4         Diabetes: >6.4         Glycemic control for adults with diabetes: <7.0     CBG: Recent Labs  Lab 06/01/22 1120 06/01/22 1606 06/01/22 2003 06/01/22 2338 06/02/22 0342  GLUCAP 230* 325* 229* 333* 195*     Critical care time: 32 minutes     Joneen Roach,  AGACNP-BC [facility]  See Amion for personal pager PCCM on call pager [phone] until 7pm. Please call Elink 7p-7a. [phone]  06/02/2022 8:03 AM

## 2022-06-02 NOTE — Procedures (Addendum)
Patient Name: Patrick Franklin  MRN: [id number]  Epilepsy Attending: Charlsie Quest  Referring Physician/Provider: Cheri Fowler, MD  Duration: 06/01/2022 1100 to 06/02/2022 1100   Patient history: 52 year old male with hypertension, hyperlipidemia and prior right MCA stroke, complicated with malignant cerebral edema requiring decompressive craniectomy, underwent cranioplasty and VP shunt, who presented with increasing headache, noted to have cranioplasty failure, today he underwent cranioplasty again, upon arrival to ICU patient was not responding, he had forced left gaze deviation, he became hypotensive, hypoxic and started gurgling. EEG to evaluate for seizure   Level of alertness:  lethargic, asleep   AEDs during EEG study: LCM, TPM, VPA, LEV, Clonazepam, Perampanel   Technical aspects: This EEG study was done with scalp electrodes positioned according to the 10-20 International system of electrode placement. Electrical activity was reviewed with band pass filter of 1-70Hz , sensitivity of 7 uV/mm, display speed of 86mm/sec with a 60Hz  notched filter applied as appropriate. EEG data were recorded continuously and digitally stored.  Video monitoring was available and reviewed as appropriate.   Description: No clear posterior dominant rhythm was seen.  Sleep was characterized by sleep spindles (12 to 14 Hz), maximal frontocentral region.  EEG showed continuous generalized and lateralized right hemisphere low amplitude 2-3hz  delta slowing. Lateralized periodic discharges were noted arising from left hemisphere, maximal left occipital region with fluctuating frequency of 0.5 to 2.5 Hz at times with overriding rhythmicity ( LPD+R), lasting 4-10 seconds, more frequent when awake/stimulated  Event button was pressed on 06/02/2022 at 0939 and 1002 for bilateral upper extremity flexion and tremor-like movement.  Concomitant EEG before, during and after the event did not show any definite EEG change to suggest  seizure.   ABNORMALITY - Lateralized periodic discharges with overriding rhythmicity, left hemisphere, maximal left occipital region  ( LPD+R) - Continuous slow, generalized and lateralized right hemisphere    IMPRESSION: This study showed evidence of epileptogenicity arising from left hemisphere, maximal left occipital region which is on the ictal-interictal continuum with increased risk of seizures. Additionally there was profound diffuse encephalopathy, likely related to sedation.  No definite seizures were noted.  Event button was pressed on 06/02/2022 at 0939 and 1002 for bilateral upper extremity flexion and tremor-like movements without definite concomitant EEG change.  However, focal motor seizures may not be seen on scalp EEG.  Clinical correlation is recommended.   [name]

## 2022-06-02 NOTE — Progress Notes (Signed)
Patient transported to CT and back to room via vent without incident

## 2022-06-02 NOTE — Progress Notes (Signed)
Palliative Medicine Progress Note   Patient Name: Patrick Franklin       Date: 06/02/2022 DOB: 09/22/1970  Age: 52 y.o. MRN#: [id number] Attending Physician: Julio Sicks, MD Primary Care Physician: Charlott Rakes, MD Admit Date: 05/08/2022    HPI/Patient Profile: 52 y.o. 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 He subsequently developed a seizure disorder. He then had another stroke in 2020, this time it was left MCA. For the past 6 months, patient was having worsening headaches and was founf to have failure of his craniotomy.  He presented to [facility] on 05/16/2022 for craniotomy revision. Post-op course was complicated by status epilepticus requiring intubation.   Palliative Medicine has been consulted for goals of care discussions.    Subjective: Chart reviewed and update received from RN. Per neurology, patient has been having focal motor seizures which would not show on EEG.  Sister/Julie and oldest daughter/Jessica are present at bedside. They reports patient has been opening his eyes but not having purposeful movement. They remain cautiously hopeful for improvement.   Objective:  Physical Exam Vitals reviewed.  Constitutional:      General: He is not in acute distress.    Appearance: He is ill-appearing.  Pulmonary:     Comments: ETT/vent Neurological:     Comments: Minimally responsive             Vital Signs: BP 126/84   Pulse (!) 142   Temp 100.1 F (37.8 C) (Axillary)   Resp (!) 3   Ht 5\' 10"  (1.778 m)   Wt 102.9 kg   SpO2 100%   BMI 32.55 kg/m  SpO2: SpO2: 100 % O2 Device: O2 Device: Ventilator    Palliative Medicine Assessment & Plan    Assessment: Principal Problem:   History of cranioplasty Active Problems:   Seizure (HCC)   Acquired skull defect   Shock (HCC)   Status epilepticus (HCC)   Severe sepsis (HCC)   VP (ventriculoperitoneal) shunt status   Acute respiratory failure with hypoxia (HCC)    Recommendations/Plan: Continue full scope interventions Allow time for outcomes Ongoing palliative support   Code Status: DNR   Prognosis:  Unable to determine    Thank you for allowing the Palliative Medicine  Team to assist in the care of this patient.   MDM - moderate   Merry Proud, NP   Please contact Palliative Medicine Team phone at [phone] for questions and concerns.  For individual providers, please see AMION.

## 2022-06-02 NOTE — Progress Notes (Signed)
vLTM maintenance  All impedances below 10kohm.  No skin breakdown noted at all skin sites 

## 2022-06-02 NOTE — Progress Notes (Signed)
No new issues or problems.  Remains on ventilator.  No evidence of active seizures off sedation.  Afebrile.  Remains tachycardic.  Blood pressure normal.  Urine output good.  Occasional weak eye-opening to noxious stimuli.  Patient grimaces.  Little bit of flexion with his right upper extremity but nothing purposeful.  Wound clean and dry.  Follow-up head CT scan overall looks good.  No new evidence of bleeding or complicating features.  No evidence of recurrent seizures so far.  Patient slowly emerging from his postictal state.  Continue supportive efforts and multiple antiepileptic drugs.

## 2022-06-03 DIAGNOSIS — G40901 Epilepsy, unspecified, not intractable, with status epilepticus: Secondary | ICD-10-CM | POA: Diagnosis not present

## 2022-06-03 DIAGNOSIS — Z9889 Other specified postprocedural states: Secondary | ICD-10-CM | POA: Diagnosis not present

## 2022-06-03 DIAGNOSIS — J9601 Acute respiratory failure with hypoxia: Secondary | ICD-10-CM | POA: Diagnosis not present

## 2022-06-03 DIAGNOSIS — R569 Unspecified convulsions: Secondary | ICD-10-CM | POA: Diagnosis not present

## 2022-06-03 DIAGNOSIS — Z515 Encounter for palliative care: Secondary | ICD-10-CM | POA: Diagnosis not present

## 2022-06-03 LAB — COMPREHENSIVE METABOLIC PANEL
ALT: 14 U/L (ref 0–44)
AST: 21 U/L (ref 15–41)
Albumin: <1.5 g/dL — ABNORMAL LOW (ref 3.5–5.0)
Alkaline Phosphatase: 86 U/L (ref 38–126)
Anion gap: 7 (ref 5–15)
BUN: 24 mg/dL — ABNORMAL HIGH (ref 6–20)
CO2: 24 mmol/L (ref 22–32)
Calcium: 7.7 mg/dL — ABNORMAL LOW (ref 8.9–10.3)
Chloride: 107 mmol/L (ref 98–111)
Creatinine, Ser: 0.8 mg/dL (ref 0.61–1.24)
GFR, Estimated: >60 mL/min (ref >60)
Glucose, Bld: 203 mg/dL — ABNORMAL HIGH (ref 70–99)
Potassium: 4 mmol/L (ref 3.5–5.1)
Sodium: 138 mmol/L (ref 135–145)
Total Bilirubin: 0.4 mg/dL (ref 0.3–1.2)
Total Protein: 4.7 g/dL — ABNORMAL LOW (ref 6.5–8.1)

## 2022-06-03 LAB — MAGNESIUM: Magnesium: 2.1 mg/dL (ref 1.7–2.4)

## 2022-06-03 LAB — GLUCOSE, CAPILLARY
Glucose-Capillary: 113 mg/dL — ABNORMAL HIGH (ref 70–99)
Glucose-Capillary: 122 mg/dL — ABNORMAL HIGH (ref 70–99)
Glucose-Capillary: 134 mg/dL — ABNORMAL HIGH (ref 70–99)
Glucose-Capillary: 159 mg/dL — ABNORMAL HIGH (ref 70–99)
Glucose-Capillary: 165 mg/dL — ABNORMAL HIGH (ref 70–99)
Glucose-Capillary: 166 mg/dL — ABNORMAL HIGH (ref 70–99)

## 2022-06-03 LAB — CBC
HCT: 28.4 % — ABNORMAL LOW (ref 39.0–52.0)
Hemoglobin: 9.1 g/dL — ABNORMAL LOW (ref 13.0–17.0)
MCH: 31 pg (ref 26.0–34.0)
MCHC: 32 g/dL (ref 30.0–36.0)
MCV: 96.6 fL (ref 80.0–100.0)
Platelets: 160 10*3/uL (ref 150–400)
RBC: 2.94 MIL/uL — ABNORMAL LOW (ref 4.22–5.81)
RDW: 14.6 % (ref 11.5–15.5)
WBC: 8.6 10*3/uL (ref 4.0–10.5)
nRBC: 0.3 % — ABNORMAL HIGH (ref 0.0–0.2)

## 2022-06-03 LAB — PHOSPHORUS: Phosphorus: 3.7 mg/dL (ref 2.5–4.6)

## 2022-06-03 LAB — CULTURE, RESPIRATORY W GRAM STAIN

## 2022-06-03 MED ORDER — OXYCODONE HCL 5 MG PO TABS
5.0000 mg | ORAL_TABLET | Freq: Four times a day (QID) | ORAL | Status: DC
  Administered 2022-06-03 – 2022-06-07 (×16): 5 mg
  Filled 2022-06-03 (×16): qty 1

## 2022-06-03 MED ORDER — TOPIRAMATE 25 MG PO TABS
200.0000 mg | ORAL_TABLET | Freq: Two times a day (BID) | ORAL | Status: DC
  Administered 2022-06-03 – 2022-06-06 (×7): 200 mg
  Filled 2022-06-03 (×7): qty 8

## 2022-06-03 MED ORDER — BETHANECHOL CHLORIDE 10 MG PO TABS
10.0000 mg | ORAL_TABLET | Freq: Three times a day (TID) | ORAL | Status: AC
  Administered 2022-06-03 – 2022-06-05 (×9): 10 mg
  Filled 2022-06-03 (×9): qty 1

## 2022-06-03 MED ORDER — PERAMPANEL 2 MG PO TABS
4.0000 mg | ORAL_TABLET | Freq: Every day | ORAL | Status: DC
  Administered 2022-06-03 – 2022-06-06 (×4): 4 mg
  Filled 2022-06-03 (×4): qty 2

## 2022-06-03 MED ORDER — DEXMEDETOMIDINE HCL IN NACL 400 MCG/100ML IV SOLN: 0.0000 ug/kg/h | INTRAVENOUS | Status: DC

## 2022-06-03 MED ORDER — FENTANYL CITRATE PF 50 MCG/ML IJ SOSY
50.0000 ug | PREFILLED_SYRINGE | INTRAMUSCULAR | Status: DC | PRN
  Administered 2022-06-03 – 2022-06-06 (×7): 50 ug via INTRAVENOUS
  Filled 2022-06-03 (×7): qty 1

## 2022-06-03 MED ORDER — HALOPERIDOL LACTATE 5 MG/ML IJ SOLN
5.0000 mg | Freq: Once | INTRAMUSCULAR | Status: DC
  Filled 2022-06-03: qty 1

## 2022-06-03 MED ORDER — CHLORHEXIDINE GLUCONATE CLOTH 2 % EX PADS
6.0000 | MEDICATED_PAD | Freq: Every day | CUTANEOUS | Status: DC
  Administered 2022-06-04 – 2022-06-06 (×4): 6 via TOPICAL

## 2022-06-03 MED ORDER — DOCUSATE SODIUM 50 MG/5ML PO LIQD: 100.0000 mg | Freq: Two times a day (BID) | ORAL | Status: DC

## 2022-06-03 NOTE — Procedures (Signed)
Patient Name: Patrick Franklin  MRN: [id number]  Epilepsy Attending: Charlsie Quest  Referring Physician/Provider: Cheri Fowler, MD  Duration: 06/02/2022 1100 to 06/03/2022 1100   Patient history: 52 year old male with hypertension, hyperlipidemia and prior right MCA stroke, complicated with malignant cerebral edema requiring decompressive craniectomy, underwent cranioplasty and VP shunt, who presented with increasing headache, noted to have cranioplasty failure, today he underwent cranioplasty again, upon arrival to ICU patient was not responding, he had forced left gaze deviation, he became hypotensive, hypoxic and started gurgling. EEG to evaluate for seizure   Level of alertness:  lethargic, asleep   AEDs during EEG study: LCM, TPM, VPA, LEV, Clonazepam, Perampanel   Technical aspects: This EEG study was done with scalp electrodes positioned according to the 10-20 International system of electrode placement. Electrical activity was reviewed with band pass filter of 1-70Hz , sensitivity of 7 uV/mm, display speed of 65mm/sec with a 60Hz  notched filter applied as appropriate. EEG data were recorded continuously and digitally stored.  Video monitoring was available and reviewed as appropriate.   Description: No clear posterior dominant rhythm was seen.  Sleep was characterized by sleep spindles (12 to 14 Hz), maximal frontocentral region. EEG showed continuous generalized and lateralized right hemisphere low amplitude 2-3hz  delta slowing. Lateralized periodic discharges were noted arising from left hemisphere, maximal left occipital region with fluctuating frequency of 1 to 2.5 Hz, at times with overriding rhythmicity ( LPD+R), lasting 4-10 seconds, more frequent when awake/stimulated   Event button was pressed on 06/02/2022 at 1908 and 2027 for bilateral upper extremity flexion and tremor-like movement.  Concomitant EEG before, during and after the event showed ateralized periodic discharges were  noted arising from left hemisphere, maximal left occipital region with fluctuating frequency of 1 to 2.5 Hz, rarely reaching 3 hz without definite evolution.   ABNORMALITY - Lateralized periodic discharges with overriding rhythmicity, left hemisphere, maximal left occipital region  ( LPD+R) - Continuous slow, generalized and lateralized right hemisphere    IMPRESSION: This study showed evidence of epileptogenicity arising from left hemisphere, maximal left occipital region which is on the ictal-interictal continuum with increased risk of seizures. Additionally there was profound diffuse encephalopathy, likely related to sedation.  No definite seizures were noted.   Event button was pressed on 06/02/2022 at 1908 and 2027 for bilateral upper extremity flexion and tremor-like movements without definite concomitant EEG change.  However, focal motor seizures may not be seen on scalp EEG.  Clinical correlation is recommended.   Brigitte Soderberg Annabelle Harman

## 2022-06-03 NOTE — Progress Notes (Signed)
   06/03/22 0730  Vitals  Pulse Rate (!) 156  ECG Heart Rate (!) 156  Resp (!) 40  Oxygen Therapy  SpO2 99 %  Art Line  Arterial Line BP 190/123  Arterial Line MAP (mmHg) 149 mmHg  MEWS Score  MEWS Temp 1  MEWS Systolic 0  MEWS Pulse 3  MEWS RR 3  MEWS LOC 1  MEWS Score 8  MEWS Score Color Red   Patient looks very uncomfortable at the moment. CCM NP came to the bedside and we discussed the plan for the day. New orders being written currently. Pt does not seem to be seizing. PRN already given with night RN so I am limited on what I can do right now. Monitoring closely.   Celvin Taney, [place] Scrape, RN

## 2022-06-03 NOTE — Progress Notes (Signed)
Subjective: Continue to have more episodes of increased blood pressure, increased heart rate overnight with transient improvement with fentanyl bolus.  Sister and brother-in-law at bedside  ROS: Unable to obtain due to poor mental status  Examination  Vital signs in last 24 hours: Temp:  [99.2 F (37.3 C)-100.9 F (38.3 C)] 99.7 F (37.6 C) (05/10 0800) Pulse Rate:  [119-156] 126 (05/10 1100) Resp:  [20-40] 20 (05/10 1100) BP: (104-155)/(67-98) 124/76 (05/10 1100) SpO2:  [98 %-100 %] 100 % (05/10 1100) Arterial Line BP: (95-190)/(64-123) 111/71 (05/10 1100) FiO2 (%):  [30 %] 30 % (05/10 0806) Weight:  [101.2 kg] 101.2 kg (05/10 0500)  General: lying in bed, intubated and sedated on propofol and Versed Neuro: Opens eyes to tactile stimulation, appears to move his eyes but does not track examiner, PERRLA, corneal reflex present, no gaze deviation, cough reflex present, withdraws to noxious stimulation in all extremities with antigravity strength in right upper extremity  Basic Metabolic Panel: Recent Labs  Lab 05/28/22 0635 05/30/22 0647 05/31/22 0353 06/01/22 0349 06/02/22 0422 06/03/22 0419  NA 144 145 142 139 139 138  K 3.4* 3.8 3.6 3.9 3.8 4.0  CL 112* 113* 112* 107 108 107  CO2 23 23 21* 23 24 24   GLUCOSE 141* 202* 171* 205* 237* 203*  BUN 18 15 15 20 20  24*  CREATININE 0.77 0.76 0.70 0.83 0.74 0.80  CALCIUM 7.6* 7.3* 7.2* 7.4* 7.7* 7.7*  MG 1.9 1.9  --  2.0 2.0 2.1  PHOS 2.0* 3.9  --   --  3.3 3.7    CBC: Recent Labs  Lab 05/28/22 0635 05/29/22 0236 05/30/22 0647 05/31/22 0353 06/02/22 0422 06/03/22 0419  WBC 5.1 5.2 6.3 7.0 8.3 8.6  NEUTROABS 2.9  --  2.5  --   --   --   HGB 10.2* 9.7* 9.9* 10.9* 10.0* 9.1*  HCT 29.4* 29.1* 29.7* 33.1* 30.7* 28.4*  MCV 90.5 92.4 94.0 94.0 95.3 96.6  PLT 51* 61* 79* 105* 139* 160     Coagulation Studies: No results for input(s): "LABPROT", "INR" in the last 72 hours.  Imaging No new brain imaging  overnight  ASSESSMENT AND PLAN: 52 year old male admitted for cranioplasty revision on 4/30.   Had frequent seizures after reducing sedation arising from left occipital region.   Status epilepticus -No definite seizures on EEG but continues to have LPDs in left occipital region which at times appear rhythmic, more when awake/stimulated.   Recommendations -Even though no definite seizures, the periodic epileptiform discharges appear to be more frequent.  Therefore we will increase topiramate to 200 mg twice daily and perampanel to 4 mg nightly -Continue Keppra 2000mg  BID, Clonazepam to 0.5mg  TID, Vimpat 200 mg twice daily, Depakote 1000 mg twice daily -If seizures recur, can increase perampanel to 6 mg  -Mental status appears to be gradually improving, now opening eyes but not following commands.  Per family, he would not want trach and PEG.  Therefore we will continue to monitor till Monday and readdress goals of care. - Continue seizure precautions - PRN IV versed for clinical seizures -Discussed plan with family at bedside as well as ICU team    I have spent a total of  37 minutes with the patient reviewing hospital notes,  test results, labs and examining the patient as well as establishing an assessment and plan.  > 50% of time was spent in direct patient care.      Lindie Spruce Epilepsy [facility] For questions  after 5pm please refer to AMION to reach the Neurologist on call

## 2022-06-03 NOTE — TOC Progression Note (Signed)
Transition of Care [facility]) - Progression Note    Patient Details  Name: Patrick Franklin MRN: [id number] Date of Birth: [birth year]  Transition of Care [facility]) CM/SW Contact  Mearl Latin, LCSW Phone Number: 06/03/2022, 10:06 AM  Clinical Narrative:    CSW following for medical progression.    Expected Discharge Plan: Skilled Nursing Facility Barriers to Discharge: Continued Medical Work up  Expected Discharge Plan and Services In-house Referral: Clinical Social Work   Post Acute Care Choice: Skilled Nursing Facility Living arrangements for the past 2 months: Skilled Nursing Facility                                       Social Determinants of Health (SDOH) Interventions SDOH Screenings   Transportation Needs: No Transportation Needs (01/31/2022)  Tobacco Use: Medium Risk (05/25/2022)    Readmission Risk Interventions     No data to display

## 2022-06-03 NOTE — TOC Progression Note (Signed)
Transition of Care [facility]) - Progression Note    Patient Details  Name: Patrick Franklin MRN: [id number] Date of Birth: 1970/01/29  Transition of Care [facility]) CM/SW Contact  Mearl Latin, LCSW Phone Number: 06/03/2022, 10:05 AM  Clinical Narrative:    CSW continuing to follow for medical progression.    Expected Discharge Plan: Skilled Nursing Facility Barriers to Discharge: Continued Medical Work up  Expected Discharge Plan and Services In-house Referral: Clinical Social Work   Post Acute Care Choice: Skilled Nursing Facility Living arrangements for the past 2 months: Skilled Nursing Facility                                       Social Determinants of Health (SDOH) Interventions SDOH Screenings   Transportation Needs: No Transportation Needs (01/31/2022)  Tobacco Use: Medium Risk (05/25/2022)    Readmission Risk Interventions     No data to display

## 2022-06-03 NOTE — Progress Notes (Signed)
EEG maintenance performed. No noted skin break down. [facility] still monitoring.

## 2022-06-03 NOTE — Progress Notes (Signed)
Palliative Medicine Progress Note   Patient Name: Patrick Franklin       Date: 06/03/2022 DOB: [birth year]  Age: 52 y.o. MRN#: [id number] Attending Physician: Patrick Sicks, MD Primary Care Physician: Patrick Rakes, MD Admit Date: 05/04/2022  Reason for Consultation/Follow-up: {Reason for Consult:23484}  HPI/Patient Profile: 52 y.o. 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 He subsequently developed a seizure disorder. He then had another stroke in 2020, this time it was left MCA. For the past 6 months, patient was having worsening headaches and was founf to have failure of his craniotomy.  He presented to [facility] on 05/08/2022 for craniotomy revision. Post-op course was complicated by status epilepticus requiring intubation.   Palliative Medicine has been consulted for goals of care discussions.  Subjective: Chart reviewed and patient assessed at bedside.   Sister/Patrick Franklin and I meet briefly in the 4N consult room. She reports some minor improvements in neurologic status - he is opening eyes more and seems to be more responsive to painful stimuli. She shares that this makes it more difficult to make decisions about transitioning away from full scope care.   Patrick Franklin also is concerned that continuing full scope care is not consistent with Patrick Franklin's previously expressed wishes. Discussed that even with best case scenario, Patrick Franklin would not return to his previous baseline.   Objective:  Physical Exam          Vital Signs: BP 126/82   Pulse (!) 107   Temp 99.1 F (37.3 C) (Axillary)   Resp 20   Ht 5\' 10"  (1.778 m)   Wt 101.2 kg   SpO2 100%   BMI 32.01 kg/m  SpO2: SpO2:  100 % O2 Device: O2 Device: Ventilator    LBM: Last BM Date : 06/03/22     Palliative Assessment/Data: ***     Palliative Medicine Assessment & Plan   Assessment: Principal Problem:   History of cranioplasty Active Problems:   Seizure (HCC)   Acquired skull defect   Shock (HCC)   Status epilepticus (HCC)   Severe sepsis (HCC)   VP (ventriculoperitoneal) shunt status   Acute respiratory failure with hypoxia (HCC)    Recommendations/Plan: ***  Goals of Care and Additional Recommendations: Limitations on Scope of  Treatment: {Recommended Scope and Preferences:21019}  Code Status:   Prognosis:  {Palliative Care Prognosis:23504}  Discharge Planning: {Palliative dispostion:23505}  Care plan was discussed with ***  Thank you for allowing the Palliative Medicine Team to assist in the care of this patient.   ***   Patrick Proud, NP   Please contact Palliative Medicine Team phone at [phone] for questions and concerns.  For individual providers, please see AMION.

## 2022-06-03 NOTE — Progress Notes (Signed)
Spoke with neurosurgery regarding pt's VP shunt and MRI compatibility. Per MD it is a Strata valve shunt and is MRI compatible. He will have one of his colleagues reprogram shunt within 72 hours of MRI. Also verbal order to remove staples from head. Witt Plitt, [place] Scrape, RN

## 2022-06-03 NOTE — Progress Notes (Signed)
No new issues or problems.  No documented seizures overnight.  Patient awakens to voice.  He will track a little bit with his eyes and appears somewhat aware.  Not following commands.  Wound clean and dry.  Will flex his right upper extremity to pain 70 purposefully.  Patient emerging from postictal state.  Continue supportive management and antiepileptic drugs.

## 2022-06-03 NOTE — Progress Notes (Signed)
NAME:  Patrick Franklin, MRN:  [id number], DOB:  [birth year], LOS: 10 ADMISSION DATE:  05/21/2022, CONSULTATION DATE: 05/23/2022 REFERRING MD: Dr. Julio Sicks, CHIEF COMPLAINT: Altered mental status  History of Present Illness:  52 year old male with hypertension, hyperlipidemia and prior right MCA stroke, complicated with malignant cerebral edema requiring decompressive craniectomy, underwent cranioplasty and VP shunt, who presented with increasing headache, noted to have cranioplasty failure, today he underwent cranioplasty again, upon arrival to ICU patient was not responding, he had forced left gaze deviation, he became hypotensive, hypoxic and started gurgling.  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), Asthma, Cardiomegaly (04/13/2016), Cerebral embolism with cerebral infarction (10/03/2018), DVT (deep venous thrombosis) (HCC), Dysphagia following other cerebrovascular disease, Elevated serum creatinine, Essential hypertension (05/15/2017), GERD without esophagitis, Headache, Hemiparesis affecting left side as late effect of stroke (08/09/2017), History of ischemic right MCA stroke (2017), History of left MCA stroke (2020), History of renal stone (08/22/2017), Homonymous bilateral field defects, Hyperlipidemia (05/15/2017), 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   Significant Hospital Events: Including procedures, antibiotic start and stop dates in addition to other pertinent events   5/6 weaning sedation while on EEG,  remains intubated 5/9 Sedation off. Starting to open eyes and trigger vent.   Interim History / Subjective:  Started marginally waking up yesterday.  Still with low grade fevers Several episodes concerning for sz yesterday, but no EEG correlation.  This morning he is tachycardic, tachypneic, and appears uncomfortable per RN Does improve with fentanyl, but effects are short lived.  Diuresing well. 2L neg x 24 hours. Remains 14L pos total.    Objective   Blood pressure 132/88, pulse (!) 156, temperature (!) 100.5 F (38.1 C), temperature source Axillary, resp. rate (!) 40, height 5\' 10"  (1.778 m), weight 101.2 kg, SpO2 99 %.    Vent Mode: PRVC FiO2 (%):  [30 %] 30 % Set Rate:  [20 bmp] 20 bmp Vt Set:  [580 mL] 580 mL PEEP:  [5 cmH20] 5 cmH20 Pressure Support:  [12 cmH20] 12 cmH20 Plateau Pressure:  [19 cmH20-22 cmH20] 22 cmH20   Intake/Output Summary (Last 24 hours) at 06/03/2022 0809 Last data filed at 06/03/2022 0600 Gross per 24 hour  Intake 1900.16 ml  Output 4000 ml  Net -2099.84 ml    Filed Weights   06/01/22 0708 06/02/22 0420 06/03/22 0500  Weight: 105 kg 102.9 kg 101.2 kg     Physical exam:  General: Middle aged adult male on vent HEENT: S/p cranioplasty with staples in place on R side, eyes anicteric.  Neuro: Sedation off. Eyes open to voice and seem to track. Does not follow any commands. Moving extremities minimally, but spontaneously.  Chest: Clear bilateral breath sounds Heart: RRR, no MRG Abdomen: Soft, NT, hypoactive.  Skin: Grossly intact.    Resolved Hospital Problem list   Acute kidney injury Propofol induced hypertriglyceridemia Lactic acidosis due to recurrent seizures/septic shock Hiccups Bleeding from gums and IV puncture site Acute blood loss anemia perioperatively Thrombocytopenia critical illness  Assessment & Plan:  Status epilepticus  Acute encephalopathy due to recurrent seizures Sedative drips weaned x 48 hours EEG read is pending  for today. Has remained sz free, but  still having LPDs L occipital  Neurology, epileptology are following Continue Keppra, Topamax, Klonopin and Depakote  Perampanel started 5/7 > can increase to 4mg  if sz recur  If SE remains refractory to current treatment there are limited options.   Acute hypoxic respiratory failure Severe sepsis with septic shock due to bilateral multifocal pneumonia Finally afebrile overnight Unable to remove foley due to scrotal edema. Retention meds started 5/9. Hopefully we can DC foley in 23-48 hours. Unable to remove CVL due to anasarca, limited PIV sites. Consider PICC Cultures remain negative BAL pending Continue lung protective ventilation Can consider short SBT is he becomes more comfortable.   Prior right MCA stroke s/p craniectomy and now with cranioplasty Hydrocephalus s/p VP shunt Postop management defer to neurosurgery Shunt setting was changed by neurosurgery during this visit  Hypokalemia/hypophosphatemia Continue aggressive electrolyte supplement and monitor  Anasarca Patient received lots of IV fluid, he is net +16L since admission Lasix 40 mg twice daily KCL 40 meq BID   Best Practice (right click and "Reselect all SmartList Selections" daily)   Diet/type: NPO tube feeds DVT prophylaxis: Subcu Lovenox GI prophylaxis: PPI Lines: NA Foley:  Yes, still needed Code Status:  full code Last date of multidisciplinary goals of care discussion [5/9: Patient's family was updated at bedside]  Labs   CBC: Recent Labs  Lab 05/28/22 0635 05/29/22 0236 05/30/22 0647 05/31/22 0353 06/02/22 0422 06/03/22 0419  WBC 5.1 5.2 6.3 7.0 8.3 8.6  NEUTROABS 2.9  --  2.5  --   --   --   HGB 10.2* 9.7* 9.9* 10.9* 10.0* 9.1*  HCT 29.4* 29.1* 29.7* 33.1* 30.7* 28.4*  MCV 90.5 92.4 94.0 94.0 95.3 96.6  PLT 51* 61* 79* 105* 139* 160     Basic Metabolic Panel: Recent Labs  Lab 05/28/22 0635 05/30/22 0647 05/31/22 0353 06/01/22 0349  06/02/22 0422 06/03/22 0419  NA 144 145 142 139 139 138  K 3.4* 3.8 3.6 3.9 3.8 4.0  CL 112* 113* 112* 107 108 107  CO2 23 23 21* 23 24 24   GLUCOSE 141* 202* 171* 205* 237* 203*  BUN 18 15 15 20 20  24*  CREATININE 0.77 0.76 0.70 0.83 0.74 0.80  CALCIUM 7.6* 7.3* 7.2* 7.4* 7.7* 7.7*  MG 1.9 1.9  --  2.0 2.0 2.1  PHOS 2.0* 3.9  --   --  3.3 3.7    GFR: Estimated Creatinine Clearance: 131.7 mL/min (by C-G formula based on SCr of 0.8 mg/dL). Recent Labs  Lab 05/30/22 0647 05/31/22 0353 06/02/22 0422 06/03/22 0419  WBC 6.3 7.0 8.3 8.6  LATICACIDVEN  --  1.1  --   --      Liver Function Tests: Recent Labs  Lab 05/28/22 0635 06/03/22 0419  AST 19 21  ALT 11 14  ALKPHOS 55 86  BILITOT 0.5 0.4  PROT 4.0* 4.7*  ALBUMIN 1.8* <1.5*    No results for input(s): "LIPASE", "AMYLASE" in the last 168 hours. Recent Labs  Lab 05/27/22 1620 06/02/22 1500  AMMONIA 38* 27     ABG    Component Value Date/Time   PHART 7.337 (L) 05/27/2022 0735   PCO2ART 41.5 05/27/2022 0735   PO2ART 127 (H) 05/27/2022 0735   HCO3 22.2 05/27/2022 0735   TCO2 23 05/27/2022 0735   ACIDBASEDEF 3.0 (H) 05/27/2022 0735   O2SAT 99 05/27/2022 0735     Coagulation Profile: Recent Labs  Lab 05/29/22 0236  INR 1.1     Cardiac Enzymes: No  results for input(s): "CKTOTAL", "CKMB", "CKMBINDEX", "TROPONINI" in the last 168 hours.  HbA1C: Hgb A1c MFr Bld  Date/Time Value Ref Range Status  10/02/2018 03:33 AM 5.2 4.8 - 5.6 % Final    Comment:    (NOTE)         Prediabetes: 5.7 - 6.4         Diabetes: >6.4         Glycemic control for adults with diabetes: <7.0     CBG: Recent Labs  Lab 06/02/22 1525 06/02/22 1947 06/02/22 2330 06/03/22 0329 06/03/22 0751  GLUCAP 151* 132* 174* 159* 134*     Critical care time: 39 minutes     Joneen Roach, AGACNP-BC [facility]  See Amion for personal pager PCCM on call pager [phone] until 7pm. Please call  [facility] 7p-7a. [phone]  06/03/2022 8:09 AM

## 2022-06-03 NOTE — TOC Initial Note (Signed)
Transition of Care [facility]) - Initial/Assessment Note    Patient Details  Name: Patrick Franklin MRN: [id number] Date of Birth: Dec 24, 1970  [facility]) CM/SW Contact:    Mearl Latin, LCSW Phone Number: 06/03/2022, 10:05 AM  Clinical Narrative:                 CSW following for medical progression.   Expected Discharge Plan: Skilled Nursing Facility Barriers to Discharge: Continued Medical Work up   Patient Goals and CMS Choice            Expected Discharge Plan and Services In-house Referral: Clinical Social Work   Post Acute Care Choice: Skilled Nursing Facility Living arrangements for the past 2 months: Skilled Nursing Facility                                      Prior Living Arrangements/Services Living arrangements for the past 2 months: Skilled Nursing Facility Lives with:: Facility Resident Patient language and need for interpreter reviewed:: Yes Do you feel safe going back to the place where you live?: Yes      Need for Family Participation in Patient Care: Yes (Comment) Care giver support system in place?: Yes (comment)   Criminal Activity/Legal Involvement Pertinent to Current Situation/Hospitalization: No - Comment as needed  Activities of Daily Living      Permission Sought/Granted Permission sought to share information with : Facility Industrial/product designer granted to share information with : No     Permission granted to share info w AGENCY: [facility]        Emotional Assessment Appearance:: Appears stated age Attitude/Demeanor/Rapport: Unable to Assess Affect (typically observed): Unable to Assess Orientation: :  (Intubated) Alcohol / Substance Use: Not Applicable Psych Involvement: No (comment)  Admission diagnosis:  History of cranioplasty [Z98.890] Acquired skull defect [M95.2] Patient Active Problem List   Diagnosis Date Noted   Acute respiratory failure with hypoxia (HCC) 06/02/2022   Status  epilepticus (HCC) 06/01/2022   Severe sepsis (HCC) 06/01/2022   VP (ventriculoperitoneal) shunt status 06/01/2022   Shock (HCC) 05/25/2022   History of cranioplasty 05/07/2022   Acquired skull defect 05/11/2022   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   Headache    Presence of cerebrospinal fluid drainage device    GERD without esophagitis    Dysphagia following other cerebrovascular disease    Homonymous bilateral field defects    Suicidal ideation    Elevated serum creatinine    Cerebral embolism with cerebral infarction 10/03/2018   Thrombocytopenia 10/01/2018   Neurogenic bladder 10/01/2018   Spastic hemiplegia affecting nondominant side 03/26/2018   History of left MCA stroke 2020   Impaired decision making 08/30/2017   Aorto-iliac atherosclerosis 08/29/2017   Seizure (HCC) 08/09/2017   Hemiparesis affecting left side as late effect of stroke 08/09/2017   Essential hypertension 05/15/2017   Urinary retention 05/15/2017   Hyperlipidemia 05/15/2017   PCP:  Charlott Rakes, MD Pharmacy:   [facility] - [place], [place] - [address] [place] [address] Phone: [phone] Fax: [phone]     Social Determinants of Health (SDOH) Social History: SDOH Screenings   Transportation Needs: No Transportation Needs (01/31/2022)  Tobacco Use: Medium Risk (05/25/2022)   SDOH Interventions:     Readmission Risk Interventions     No data to display

## 2022-06-04 ENCOUNTER — Inpatient Hospital Stay (HOSPITAL_COMMUNITY): Payer: Medicare Other

## 2022-06-04 DIAGNOSIS — Z9889 Other specified postprocedural states: Secondary | ICD-10-CM | POA: Diagnosis not present

## 2022-06-04 DIAGNOSIS — R569 Unspecified convulsions: Secondary | ICD-10-CM | POA: Diagnosis not present

## 2022-06-04 DIAGNOSIS — G9349 Other encephalopathy: Secondary | ICD-10-CM

## 2022-06-04 DIAGNOSIS — J188 Other pneumonia, unspecified organism: Secondary | ICD-10-CM

## 2022-06-04 LAB — CBC
HCT: 30.5 % — ABNORMAL LOW (ref 39.0–52.0)
Hemoglobin: 9.3 g/dL — ABNORMAL LOW (ref 13.0–17.0)
MCH: 30 pg (ref 26.0–34.0)
MCHC: 30.5 g/dL (ref 30.0–36.0)
MCV: 98.4 fL (ref 80.0–100.0)
Platelets: 213 10*3/uL (ref 150–400)
RBC: 3.1 MIL/uL — ABNORMAL LOW (ref 4.22–5.81)
RDW: 14.5 % (ref 11.5–15.5)
WBC: 11.4 10*3/uL — ABNORMAL HIGH (ref 4.0–10.5)
nRBC: 0.3 % — ABNORMAL HIGH (ref 0.0–0.2)

## 2022-06-04 LAB — BASIC METABOLIC PANEL
Anion gap: 7 (ref 5–15)
BUN: 23 mg/dL — ABNORMAL HIGH (ref 6–20)
CO2: 24 mmol/L (ref 22–32)
Calcium: 8.1 mg/dL — ABNORMAL LOW (ref 8.9–10.3)
Chloride: 110 mmol/L (ref 98–111)
Creatinine, Ser: 0.68 mg/dL (ref 0.61–1.24)
GFR, Estimated: >60 mL/min (ref >60)
Glucose, Bld: 170 mg/dL — ABNORMAL HIGH (ref 70–99)
Potassium: 4 mmol/L (ref 3.5–5.1)
Sodium: 141 mmol/L (ref 135–145)

## 2022-06-04 LAB — GLUCOSE, CAPILLARY
Glucose-Capillary: 107 mg/dL — ABNORMAL HIGH (ref 70–99)
Glucose-Capillary: 113 mg/dL — ABNORMAL HIGH (ref 70–99)
Glucose-Capillary: 130 mg/dL — ABNORMAL HIGH (ref 70–99)
Glucose-Capillary: 136 mg/dL — ABNORMAL HIGH (ref 70–99)
Glucose-Capillary: 143 mg/dL — ABNORMAL HIGH (ref 70–99)
Glucose-Capillary: 152 mg/dL — ABNORMAL HIGH (ref 70–99)

## 2022-06-04 LAB — PHOSPHORUS: Phosphorus: 4.1 mg/dL (ref 2.5–4.6)

## 2022-06-04 LAB — MAGNESIUM: Magnesium: 2.2 mg/dL (ref 1.7–2.4)

## 2022-06-04 MED ORDER — PROPRANOLOL HCL 20 MG PO TABS
20.0000 mg | ORAL_TABLET | Freq: Three times a day (TID) | ORAL | Status: DC
  Administered 2022-06-04 – 2022-06-06 (×8): 20 mg
  Filled 2022-06-04 (×9): qty 1

## 2022-06-04 MED ORDER — FUROSEMIDE 10 MG/ML IJ SOLN
60.0000 mg | Freq: Three times a day (TID) | INTRAMUSCULAR | Status: DC
  Administered 2022-06-04 – 2022-06-07 (×9): 60 mg via INTRAVENOUS
  Filled 2022-06-04 (×10): qty 6

## 2022-06-04 MED ORDER — FENTANYL CITRATE PF 50 MCG/ML IJ SOSY
25.0000 ug | PREFILLED_SYRINGE | INTRAMUSCULAR | Status: DC | PRN
  Administered 2022-06-04 – 2022-06-05 (×8): 100 ug via INTRAVENOUS
  Administered 2022-06-06: 50 ug via INTRAVENOUS
  Administered 2022-06-06 (×3): 100 ug via INTRAVENOUS
  Filled 2022-06-04 (×5): qty 2
  Filled 2022-06-04: qty 1
  Filled 2022-06-04 (×7): qty 2

## 2022-06-04 MED ORDER — MIDAZOLAM HCL 2 MG/2ML IJ SOLN
1.0000 mg | INTRAMUSCULAR | Status: DC | PRN
  Administered 2022-06-04 – 2022-06-06 (×6): 4 mg via INTRAVENOUS
  Filled 2022-06-04: qty 2
  Filled 2022-06-04 (×6): qty 4

## 2022-06-04 MED ORDER — MIDAZOLAM HCL 2 MG/2ML IJ SOLN: 2.0000 mg | INTRAMUSCULAR | Status: DC | PRN

## 2022-06-04 NOTE — Procedures (Signed)
Patient Name: Patrick Franklin  MRN: [id number]  Epilepsy Attending: Charlsie Quest  Referring Physician/Provider: Cheri Fowler, MD  Duration: 06/03/2022 1100 to 06/04/2022 1053   Patient history: 52 year old male with hypertension, hyperlipidemia and prior right MCA stroke, complicated with malignant cerebral edema requiring decompressive craniectomy, underwent cranioplasty and VP shunt, who presented with increasing headache, noted to have cranioplasty failure, today he underwent cranioplasty again, upon arrival to ICU patient was not responding, he had forced left gaze deviation, he became hypotensive, hypoxic and started gurgling. EEG to evaluate for seizure   Level of alertness:  lethargic, asleep   AEDs during EEG study: LCM, TPM, VPA, LEV, Clonazepam, Perampanel   Technical aspects: This EEG study was done with scalp electrodes positioned according to the 10-20 International system of electrode placement. Electrical activity was reviewed with band pass filter of 1-70Hz , sensitivity of 7 uV/mm, display speed of 59mm/sec with a 60Hz  notched filter applied as appropriate. EEG data were recorded continuously and digitally stored.  Video monitoring was available and reviewed as appropriate.   Description: No clear posterior dominant rhythm was seen.  Sleep was characterized by sleep spindles (12 to 14 Hz), maximal frontocentral region. EEG showed continuous generalized and lateralized right hemisphere low amplitude 2-3hz  delta slowing. Lateralized periodic discharges were noted arising from left hemisphere, maximal left occipital region with fluctuating frequency of 2 to 2.5 Hz, at times with overriding rhythmicity ( LPD+R).   Event button was pressed on 06/03/2022 at 2136 for bilateral upper extremity flexion and tremor-like movement.  Concomitant EEG before, during and after the event showed ateralized periodic discharges were noted arising from left hemisphere, maximal left occipital region  with fluctuating frequency of 1 to 2.5 Hz, rarely reaching 3 hz without definite evolution.   ABNORMALITY - Lateralized periodic discharges with overriding rhythmicity, left hemisphere, maximal left occipital region  ( LPD+R) - Continuous slow, generalized and lateralized right hemisphere    IMPRESSION: This study showed evidence of epileptogenicity arising from left hemisphere, maximal left occipital region which is on the ictal-interictal continuum with increased risk of seizures. Additionally there was profound diffuse encephalopathy, likely related to sedation.  No definite seizures were noted.   Event button was pressed on 06/03/2022 at 21367 for bilateral upper extremity flexion and tremor-like movements without definite concomitant EEG change. However, focal motor seizures may not be seen on scalp EEG.  Clinical correlation is recommended.   Jaelynn Currier Annabelle Harman

## 2022-06-04 NOTE — Progress Notes (Signed)
Attempted to obtain model number for patient's [employer] strata valve shunt, but patient's sister unable to obtain info and [employer] offices closed on weekends. Notified MD and MRI tech.   0800 Notified by MRI tech that per Dr. Chase Picket, ok to proceed with MRI without exact model number. Plan to obtain MRI tonight.

## 2022-06-04 NOTE — Progress Notes (Signed)
NAME:  Patrick Franklin, MRN:  [id number], DOB:  1970-08-25, LOS: 11 ADMISSION DATE:  05/13/2022, CONSULTATION DATE: 05/19/2022 REFERRING MD: Dr. Julio Sicks, CHIEF COMPLAINT: Altered mental status  History of Present Illness:  52 year old male with hypertension, hyperlipidemia and prior right MCA stroke, complicated with malignant cerebral edema requiring decompressive craniectomy, underwent cranioplasty and VP shunt, who presented with increasing headache, noted to have cranioplasty failure, today he underwent cranioplasty again, upon arrival to ICU patient was not responding, he had forced left gaze deviation, he became hypotensive, hypoxic and started gurgling.  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), Asthma, Cardiomegaly (04/13/2016), Cerebral embolism with cerebral infarction (10/03/2018), DVT (deep venous thrombosis) (HCC), Dysphagia following other cerebrovascular disease, Elevated serum creatinine, Essential hypertension (05/15/2017), GERD without esophagitis, Headache, Hemiparesis affecting left side as late effect of stroke (08/09/2017), History of ischemic right MCA stroke (2017), History of left MCA stroke (2020), History of renal stone (08/22/2017), Homonymous bilateral field defects, Hyperlipidemia (05/15/2017), 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   Significant Hospital Events: Including procedures, antibiotic start and stop dates in addition to other pertinent events   4/30 INTUBATED 4.30 IO line place .  >>5/1 5/1- RT IJ CVL 5/2 - lefg brachial A- line 5/6 weaning sedation while on EEG, remains intubated 5/7 Cultures BAL - Moderate Candidate - BRONCH MRSA PCR neg 5/8 - CORTRAK 5/9 Sedation off. Starting to open eyes and trigger vent.  5/10 - Started marginally waking up yesterday.  Still with low grade fevers. Several episodes concerning for sz yesterday, but no EEG correlation.  This morning he is tachycardic, tachypneic, and appears uncomfortable per RN. Does improve with fentanyl, but effects are short lived.  Diuresing well. 2L neg x 24 hours. Remains 14L pos total.   Interim History / Subjective:   5/11 - 30% on vent. On Tube feeeds. Afebvile.  Fever curbe better. Neuro/NSGY concerned about autonomic storming -> Dr Iver Nestle starting inderal On pron sedation   Objective   Blood pressure 120/68, pulse (!) 117, temperature 99.3 F (37.4 C), temperature source Axillary, resp. rate 20, height 5\' 10"  (1.778 m), weight 101.2 kg, SpO2 100 %.    Vent Mode: PSV;CPAP FiO2 (%):  [30 %] 30 % Set Rate:  [20 bmp] 20 bmp Vt Set:  [580 mL] 580 mL PEEP:  [5 cmH20] 5 cmH20 Pressure Support:  [8 cmH20] 8 cmH20 Plateau Pressure:  [14 cmH20-15 cmH20] 14 cmH20   Intake/Output Summary (Last 24 hours) at 06/04/2022 0948 Last data filed at 06/04/2022 0843 Gross per 24 hour  Intake 1871.74 ml  Output 4763 ml  Net -2891.26 ml   Filed Weights   06/01/22 0708 06/02/22 0420 06/03/22 0500  Weight: 105 kg 102.9 kg 101.2 kg     Physical exam:  General: Middle aged adult male on vent HEENT: S/p cranioplasty with staples in place on R side, eyes anicteric.  Neuro: Sedation off. Eyes open to voice and seem to track. Does not follow any commands. Moving extremities minimally, but spontaneously.  Chest: Clear bilateral breath sounds Heart: RRR, no MRG Abdomen: Soft, NT, hypoactive.  Skin: Grossly intact.   General Appearance:  Looks criticall ill OBESE - + Head:  SP RT CRANIPLASTY with eEG leads Eyes:   PERRL - yes, conjunctiva/corneas - muddy     Ears:  Normal external ear canals, both ears Nose:  G tube - yes Throat:  ETT TUBE - yes , OG tube - no Neck:  Supple,  No enlargement/tenderness/nodules Lungs: Clear to auscultation bilaterally, Ventilator   Synchrony - yes Heart:  S1 and S2 normal, no murmur, CVP - no.  Pressors - no Abdomen:  Soft, no masses, no organomegaly Genitalia / Rectal:  Not done Extremities:  Extremities- ntact Skin:  ntact in exposed areas . Sacral area - not examined Neurologic:  Sedation - prn -> RASS - -2 . Moves all 4s - no. CAM-ICU - x . Orientation - no . EYES OPEN . TRACKS BUT NOT FOLLOWNG COMMANDS      Resolved Hospital Problem list   Acute kidney injury Propofol induced hypertriglyceridemia Lactic acidosis due to recurrent seizures/septic shock Hiccups Bleeding from gums and IV puncture site Acute blood loss anemia perioperatively Thrombocytopenia critical illness  Assessment & Plan:  Status epilepticus  Acute encephalopathy due to recurrent seizures  5/11 - morpe awake but not following comands  Plan  - meds and eeg per neuro   Acute hypoxic respiratory failure\  06/04/2022 - > does not meet criteria for SBT/Extubation in setting of Acute Respiratory Failure due to Acute encephalpathy  Plan  - PRVC  - VAP bundle  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 -  Unable to remove CVL due to anasarca, limited PIV sites. -  Consider PICC based on course - de-escalate lines daily to extent possible   Prior right MCA stroke s/p craniectomy and now with cranioplasty Hydrocephalus s/p VP shunt Postop management defer to neurosurgery Shunt setting was changed by  neurosurgery during this visit   Anasarca - Pk 20L volume up on 05/29/22  5/11 - + 10L. Scrotal edema precluding foleuy removal  Plan  - increase lasix from 40mg  IV bid to 60mg  TID - KCL bid  Best Practice (right click and "Reselect all SmartList Selections" daily)   Diet/type: NPO tube feeds DVT prophylaxis: Subcu Lovenox GI prophylaxis: PPI Lines: NA Foley:  Yes, still needed Code Status:  full code Last date of multidisciplinary goals of care discussion   [5/9: Patient's family was updated at bedside  5/11 - sister Willette Pa [phone]  - called LMTCB    ATTESTATION & SIGNATURE   The patient Patrick Franklin is critically ill with multiple organ systems failure and requires high complexity decision making for assessment and support, frequent evaluation and titration of therapies, application of advanced monitoring technologies and extensive interpretation of multiple databases and discussion with other appropriate health care personnel such as bedside nurses, social workers, case Production designer, theatre/television/film, consultants, respiratory therapists, nutritionists, secretaries etc.,  Critical care time includes but is not restricted to just documentation time. Documentation can happen in parallel or sequential to care time depending on case mix urgency and priorities for the shift. So, overall critical Care Time devoted to patient care services described in this note is  30  Minutes.   This time reflects time of care of this signee Dr Kalman Shan which includ  does not reflect procedure time, or teaching time or supervisory time of PA/NP/Med student/Med Resident etc but could involve care discussion time     Dr. Kalman Shan, M.D., [facility].C.P Pulmonary and Critical Care Medicine Medical Director - [facility] ICU Staff Physician, [facility] [place] [facility] Pager: [phone], If no answer or between  15:00h - 7:00h: call [phone]  06/04/2022 9:48 AM     LABS    PULMONARY No results for input(s): "PHART", "PCO2ART", "PO2ART", "HCO3", "TCO2", "O2SAT" in the last 168 hours.  Invalid input(s): "PCO2", "PO2"  CBC Recent Labs  Lab 06/02/22 0422 06/03/22 0419 06/04/22 0641  HGB 10.0* 9.1* 9.3*  HCT 30.7* 28.4* 30.5*  WBC 8.3 8.6 11.4*  PLT 139* 160 213    COAGULATION Recent Labs  Lab 05/29/22 0236  INR 1.1    CARDIAC  No results for input(s): "TROPONINI" in the last 168 hours. No results for input(s): "PROBNP" in the last 168 hours.   CHEMISTRY Recent Labs  Lab 05/30/22 0647 05/31/22 0353 06/01/22 0349 06/02/22 0422 06/03/22 0419 06/04/22 0641  NA 145 142 139 139 138 141  K 3.8 3.6 3.9 3.8 4.0 4.0  CL 113* 112* 107 108 107 110  CO2 23 21* 23 24 24 24   GLUCOSE 202* 171* 205* 237* 203* 170*  BUN 15 15 20 20  24* 23*  CREATININE 0.76 0.70 0.83 0.74 0.80 0.68  CALCIUM 7.3* 7.2* 7.4* 7.7* 7.7* 8.1*  MG 1.9  --  2.0 2.0 2.1 2.2  PHOS 3.9  --   --  3.3 3.7 4.1   Estimated Creatinine Clearance: 131.7 mL/min (by C-G formula based on SCr of 0.68 mg/dL).   LIVER Recent Labs  Lab 05/29/22 0236 06/03/22 0419  AST  --  21  ALT  --  14  ALKPHOS  --  86  BILITOT  --  0.4  PROT  --  4.7*  ALBUMIN  --  <1.5*  INR 1.1  --      INFECTIOUS Recent Labs  Lab 05/31/22 0353  LATICACIDVEN 1.1     ENDOCRINE CBG (last 3)  Recent Labs    06/03/22 2335 06/04/22 0343 06/04/22 0830  GLUCAP 122* 152* 143*         IMAGING x48h  - image(s) personally visualized  -   highlighted in bold No results found.

## 2022-06-04 NOTE — Progress Notes (Signed)
vLTM discontinued  no skin breakdown noted on all skin sires  [facility] notified

## 2022-06-04 NOTE — Progress Notes (Signed)
  NEUROSURGERY PROGRESS NOTE   Pt seen and examined. No issues overnight.   EXAM: Temp:  [98.4 F (36.9 C)-99.6 F (37.6 C)] 99.3 F (37.4 C) (05/11 0800) Pulse Rate:  [106-129] 117 (05/11 0700) Resp:  [14-23] 20 (05/11 0700) BP: (116-147)/(68-128) 120/68 (05/11 0700) SpO2:  [99 %-100 %] 100 % (05/11 0700) Arterial Line BP: (104-125)/(63-85) 117/72 (05/11 0700) FiO2 (%):  [30 %] 30 % (05/11 0752) Intake/Output      05/10 0701 05/11 0700 05/11 0701 05/12 0700   I.V. (mL/kg)     NG/GT 1320    IV Piggyback 749.8    Total Intake(mL/kg) 2069.8 (20.5)    Urine (mL/kg/hr) 4263 (1.8) 825 (2.7)   Stool 100    Total Output 4363 825   Net -2293.2 -825         Awake, intermittently tracks Not following commands Moves RUE purposefully EEG in place  LABS: Lab Results  Component Value Date   CREATININE 0.68 06/04/2022   BUN 23 (H) 06/04/2022   NA 141 06/04/2022   K 4.0 06/04/2022   CL 110 06/04/2022   CO2 24 06/04/2022   Lab Results  Component Value Date   WBC 11.4 (H) 06/04/2022   HGB 9.3 (L) 06/04/2022   HCT 30.5 (L) 06/04/2022   MCV 98.4 06/04/2022   PLT 213 06/04/2022    IMPRESSION: - 52 y.o. male s/p revision cranioplasty complicated by SZ and sepsis - Episodes of agitation with hypertension, tachycardia, tachypnea don't appear to have EEG correlate, suspect sympathetic storm  PLAN: - Cont EEG and AEDs - Cont IV abx per PCCM - Can change metoprolol to propranolol  Lisbeth Renshaw, MD [facility]

## 2022-06-04 NOTE — Progress Notes (Signed)
Subjective: Continues to have more episodes of increased blood pressure, increased heart rate overnight with transient improvement with fentanyl bolus.  No family at bedside this morning on NP eval, sister at beside updated by MD  ROS: Unable to obtain due to poor mental status  Examination  Vital signs in last 24 hours: Temp:  [98.4 F (36.9 C)-99.6 F (37.6 C)] 98.4 F (36.9 C) (05/11 0400) Pulse Rate:  [106-129] 117 (05/11 0700) Resp:  [14-23] 20 (05/11 0700) BP: (116-147)/(67-128) 120/68 (05/11 0700) SpO2:  [99 %-100 %] 100 % (05/11 0700) Arterial Line BP: (104-125)/(63-85) 117/72 (05/11 0700) FiO2 (%):  [30 %] 30 % (05/11 0400)  General: lying in bed, intubated not on sedation  Neuro: Eyes open spontaneously, tracks examiner throughout with some possible left gaze preference noted, PERRL 5mm, sluggish, cough and gag reflex present, withdraws to noxious stimulation in all extremities with antigravity strength in right upper extremity, purposeful movements of the right hand noted today. Hemiplegic on the left as per patient baseline.  On later attending exam at 10:30 AM Opens eyes to voice, tracks/orients to right more readily than left (does not cross midline to the left). PERRL. Spontaneous movement of right foot. Withdraws in all four extremities, brisker on the right than the left. Positional tremor of LUE   Basic Metabolic Panel: Recent Labs  Lab 05/30/22 0647 05/31/22 0353 06/01/22 0349 06/02/22 0422 06/03/22 0419 06/04/22 0641  NA 145 142 139 139 138 141  K 3.8 3.6 3.9 3.8 4.0 4.0  CL 113* 112* 107 108 107 110  CO2 23 21* 23 24 24 24   GLUCOSE 202* 171* 205* 237* 203* 170*  BUN 15 15 20 20  24* 23*  CREATININE 0.76 0.70 0.83 0.74 0.80 0.68  CALCIUM 7.3* 7.2* 7.4* 7.7* 7.7* 8.1*  MG 1.9  --  2.0 2.0 2.1 2.2  PHOS 3.9  --   --  3.3 3.7 4.1    CBC: Recent Labs  Lab 05/30/22 0647 05/31/22 0353 06/02/22 0422 06/03/22 0419 06/04/22 0641  WBC 6.3 7.0 8.3 8.6  11.4*  NEUTROABS 2.5  --   --   --   --   HGB 9.9* 10.9* 10.0* 9.1* 9.3*  HCT 29.7* 33.1* 30.7* 28.4* 30.5*  MCV 94.0 94.0 95.3 96.6 98.4  PLT 79* 105* 139* 160 213    Coagulation Studies: No results for input(s): "LABPROT", "INR" in the last 72 hours.  Imaging No new brain imaging overnight  ASSESSMENT AND PLAN: 52 year old male admitted for cranioplasty revision on 4/30.   Had frequent seizures after reducing sedation arising from left occipital region, but now these seem to be controlled on LTM EEG. Reports of brief episodes of tachycardia/hypertension/mydriasis most consistent with sympathetic storm and without EEG correlate.    Status epilepticus -No definite seizures on EEG but continues to have LPDs in left occipital region which at times appear rhythmic, more when awake/stimulated.   Recommendations - Continue topiramate 200 mg twice daily and perampanel to 4 mg nightly (increased 5/10 due to increased frequency of LPDs on EEG) - Continue Keppra 2000mg  BID, Clonazepam 0.5mg  TID, Vimpat 200 mg twice daily, Depakote 1000 mg twice daily - If seizures recur, can increase perampanel to 6 mg  - PRN versed ordered for MRI tolerance as patient is now off of continuous sedation.  - MRI pending, will dc LTM and continue to monitor clinically for now - Mental status continues to improve, now opening eyes but not following commands.  Per family, he  would not want trach and PEG.  Therefore we will continue to monitor till Monday and readdress goals of care. - Continue seizure precautions - PRN IV versed for clinical seizures - Transition metoprolol to propanolol -- start with 20 TID to improve sympathetic storming  - clonidine not ideal due to relatively lower BP - alternatively could consider bromocriptine if still having frequent storming  - alternatively could increase clonazepam but would like to avoid this to minimize sedation and improve exam  Lanae Boast, AGACNP-BC [facility] Pager: [phone]  Attending Neurologist's note:  I personally saw this patient, gathering history, performing a neurologic examination, reviewing relevant labs, and formulated the assessment and plan, adding the note above for completeness and clarity to accurately reflect my thoughts  CRITICAL CARE Performed by: Gordy Councilman   Total critical care time: 40 minutes  Critical care time was exclusive of separately billable procedures and treating other patients.  Critical care was necessary to treat or prevent imminent or life-threatening deterioration.  Critical care was time spent personally by me on the following activities: development of treatment plan with patient and/or surrogate as well as nursing, discussions with consultants, evaluation of patient's response to treatment, examination of patient, obtaining history from patient or surrogate, ordering and performing treatments and interventions, ordering and review of laboratory studies, ordering and review of radiographic studies, pulse oximetry and re-evaluation of patient's condition.

## 2022-06-05 DIAGNOSIS — G40901 Epilepsy, unspecified, not intractable, with status epilepticus: Secondary | ICD-10-CM | POA: Diagnosis not present

## 2022-06-05 DIAGNOSIS — J9601 Acute respiratory failure with hypoxia: Secondary | ICD-10-CM | POA: Diagnosis not present

## 2022-06-05 DIAGNOSIS — Z982 Presence of cerebrospinal fluid drainage device: Secondary | ICD-10-CM | POA: Diagnosis not present

## 2022-06-05 DIAGNOSIS — Z9889 Other specified postprocedural states: Secondary | ICD-10-CM | POA: Diagnosis not present

## 2022-06-05 LAB — CBC
HCT: 31 % — ABNORMAL LOW (ref 39.0–52.0)
Hemoglobin: 9.4 g/dL — ABNORMAL LOW (ref 13.0–17.0)
MCH: 30.2 pg (ref 26.0–34.0)
MCHC: 30.3 g/dL (ref 30.0–36.0)
MCV: 99.7 fL (ref 80.0–100.0)
Platelets: 231 10*3/uL (ref 150–400)
RBC: 3.11 MIL/uL — ABNORMAL LOW (ref 4.22–5.81)
RDW: 14.6 % (ref 11.5–15.5)
WBC: 9.4 10*3/uL (ref 4.0–10.5)
nRBC: 0.2 % (ref 0.0–0.2)

## 2022-06-05 LAB — COMPREHENSIVE METABOLIC PANEL
ALT: 15 U/L (ref 0–44)
AST: 22 U/L (ref 15–41)
Albumin: 1.9 g/dL — ABNORMAL LOW (ref 3.5–5.0)
Alkaline Phosphatase: 103 U/L (ref 38–126)
Anion gap: 9 (ref 5–15)
BUN: 28 mg/dL — ABNORMAL HIGH (ref 6–20)
CO2: 26 mmol/L (ref 22–32)
Calcium: 8.3 mg/dL — ABNORMAL LOW (ref 8.9–10.3)
Chloride: 105 mmol/L (ref 98–111)
Creatinine, Ser: 0.82 mg/dL (ref 0.61–1.24)
GFR, Estimated: >60 mL/min (ref >60)
Glucose, Bld: 143 mg/dL — ABNORMAL HIGH (ref 70–99)
Potassium: 3.7 mmol/L (ref 3.5–5.1)
Sodium: 140 mmol/L (ref 135–145)
Total Bilirubin: 0.6 mg/dL (ref 0.3–1.2)
Total Protein: 5.4 g/dL — ABNORMAL LOW (ref 6.5–8.1)

## 2022-06-05 LAB — PHOSPHORUS: Phosphorus: 4.8 mg/dL — ABNORMAL HIGH (ref 2.5–4.6)

## 2022-06-05 LAB — GLUCOSE, CAPILLARY
Glucose-Capillary: 109 mg/dL — ABNORMAL HIGH (ref 70–99)
Glucose-Capillary: 129 mg/dL — ABNORMAL HIGH (ref 70–99)
Glucose-Capillary: 143 mg/dL — ABNORMAL HIGH (ref 70–99)
Glucose-Capillary: 144 mg/dL — ABNORMAL HIGH (ref 70–99)
Glucose-Capillary: 145 mg/dL — ABNORMAL HIGH (ref 70–99)
Glucose-Capillary: 150 mg/dL — ABNORMAL HIGH (ref 70–99)

## 2022-06-05 LAB — MAGNESIUM: Magnesium: 2.1 mg/dL (ref 1.7–2.4)

## 2022-06-05 MED ORDER — POTASSIUM CHLORIDE 20 MEQ PO PACK
40.0000 meq | PACK | Freq: Once | ORAL | Status: AC
  Administered 2022-06-05: 40 meq
  Filled 2022-06-05: qty 2

## 2022-06-05 NOTE — Progress Notes (Signed)
NAME:  Patrick Franklin, MRN:  [id number], DOB:  [birth year], LOS: 12 ADMISSION DATE:  05/06/2022, CONSULTATION DATE: 05/10/2022 REFERRING MD: Dr. Julio Sicks, CHIEF COMPLAINT: Altered mental status  History of Present Illness:  52 year old male with hypertension, hyperlipidemia and prior right MCA stroke, complicated with malignant cerebral edema requiring decompressive craniectomy, underwent cranioplasty and VP shunt, who presented with increasing headache, noted to have cranioplasty failure, today he underwent cranioplasty again, upon arrival to ICU patient was not responding, he had forced left gaze deviation, he became hypotensive, hypoxic and started gurgling.  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), Asthma, Cardiomegaly (04/13/2016), Cerebral embolism with cerebral infarction (10/03/2018), DVT (deep venous thrombosis) (HCC), Dysphagia following other cerebrovascular disease, Elevated serum creatinine, Essential hypertension (05/15/2017), GERD without esophagitis, Headache, Hemiparesis affecting left side as late effect of stroke (08/09/2017), History of ischemic right MCA stroke (2017), History of left MCA stroke (2020), History of renal stone (08/22/2017), Homonymous bilateral field defects, Hyperlipidemia (05/15/2017), 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   Significant Hospital Events: Including procedures, antibiotic start and stop dates in addition to other pertinent events   4/30 INTUBATED 4.30 IO line place .  >>5/1 5/1- RT IJ CVL 5/2 - lefg brachial A- line 5/6 weaning sedation while on EEG, remains intubated 5/7 Cultures BAL - Moderate Candidate - BRONCH MRSA PCR neg 5/8 - CORTRAK 5/9 Sedation off. Starting to open eyes and trigger vent.  5/10 - Started marginally waking up yesterday.  Still with low grade fevers. Several episodes concerning for sz yesterday, but no EEG correlation.  This morning he is tachycardic, tachypneic, and appears uncomfortable per RN. Does improve with fentanyl, but effects are short lived.  Diuresing well. 2L neg x 24 hours. Remains 14L pos total.  5/11 - 5/11 - 30% on vent. On Tube feeeds. Afebvile.  Fever curbe better. Neuro/NSGY concerned about autonomic storming -> Dr Iver Nestle starting inderal On pron sedation  Interim History / Subjective:    5/12: NSGY noticed that agitation better after diprivan. C-EEG discontinued last night. MRI braind pending (cleared to have it) .On vent 30% fiop3. Not on sedation. On Abx. Low grade fever +. Diuresing. Down to +6L   Objective   Blood pressure 105/73, pulse (!) 124, temperature 99.3 F (37.4 C), temperature source Oral, resp. rate (!) 24, height 5\' 10"  (1.778 m), weight 101.2 kg, SpO2 100 %.    Vent Mode: PRVC FiO2 (%):  [30 %] 30 % Set Rate:  [20 bmp] 20 bmp Vt Set:  [580 mL] 580 mL PEEP:  [5 cmH20] 5 cmH20 Plateau Pressure:  [13 cmH20-15 cmH20] 13 cmH20   Intake/Output Summary (Last 24 hours) at 06/05/2022 1031 Last data filed at 06/05/2022 0800 Gross per 24 hour  Intake 2078.12 ml  Output 6845 ml  Net -4766.88 ml     General Appearance:  Looks criticall ill Head:  RT CRANIOPLASTY Eyes:  PERRL - yes, conjunctiva/corneas - muddy     Ears:  Normal external ear canals, both ears Throat:  ETT TUBE - ues  Neck:  Supple,  No enlargement/tenderness/nodules Lungs: Clear to auscultation bilaterally, Ventilator   Synchrony - yes 30% Heart:  S1 and S2 normal,  no murmur, CVP - no.  Pressors - no Abdomen:  Soft, no  masses, no organomegaly Genitalia / Rectal:  Not done Extremities:  Extremities- intact Skin:  ntact in exposed areas . Sacral area - not examined Neurologic:  Sedation - prn only IV, klonopin schedule, vimpat, keppra, FYCMPA, topamax, depakene and inderal -> RASS - -2 to +2 . Moves all 4s - yes. CAM-ICU - cannot test . Orientation - cannot test       Resolved Hospital Problem list   Acute kidney injury Propofol induced hypertriglyceridemia Lactic acidosis due to recurrent seizures/septic shock Hiccups Bleeding from gums and IV puncture site Acute blood loss anemia perioperatively Thrombocytopenia critical illness  Assessment & Plan:  Status epilepticus  Acute encephalopathy due to recurrent seizures  5/11 and 5/12 - morpe awake but not following comands. Intermittely agitated. C-EEG stopped 5/12  Plan  - medsper  neuro   Acute hypoxic respiratory failure\  06/05/2022 - > does not meet criteria for SBT/Extubation in setting of Acute Respiratory Failure due to Acute encephalpathy  Plan  - PRVC  - VAP bundle  Autonomic storm concern 06/04/22 -started propranalol  5/12 - better per NSGY  Plan  - inderal   Severe sepsis with septic shock due to bilateral multifocal pneumonia - - dc aline 5/11   5/12: Low grade fever v afebrile  PLAN  -  dc 5/12 (prior unable to remove du eto scrotal edema and retention meds started 5/9 -  Keep CVL ( placed 4/30) for now -  Consider PICC based on course esp if no terminal wean - de-escalate lines daily to extent possible   Prior right MCA stroke s/p craniectomy and now with cranioplasty Hydrocephalus s/p VP shunt Postop management defer to neurosurgery Shunt setting was changed by neurosurgery during this visit  Plan  - MRI 06/05/22 and prognosis based on that  Anasarca - Pk 20L volume up on 05/29/22  5/12 - + 6L and improving fast. Scrotal edema precluding foleuy removal but nowimproved  Plan  -  lasix 60mg  TID - KCL  bid  Best Practice (right click and "Reselect all SmartList Selections" daily)   Diet/type: NPO tube feeds DVT prophylaxis: Subcu Lovenox GI prophylaxis: PPI Lines: NA Foley:  Yes,  -> ReMOVE 06/05/22 Code Status:   DNR at admission . No LTAC/ No PEG Last date of multidisciplinary goals of care discussion   [5/9: Patient's family was updated at bedside  5/11 - sister Willette Pa [phone]  - called LMTCB  5/12 -  Interdisciplinary Goals of Care Family Meeting   Date carried out:: 06/05/2022  Location of the meeting: Bedside  Member's involved: Physician, Family Member or next of kin, and Other: siser and her husband  Durable Power of Insurance risk surveyor: sister    Discussion: We discussed goals of care for [employer] .  - quality of life important THey do not want LTAC/Trach Day 14 on ventilator is in 2 days. They want to see what MRI shows before decision on futher goals but likely comfort care with supportive care  Code status: Full DNR  Disposition: Continue current acute care   Time spent for the meeting: 10    ATTESTATION & SIGNATURE   The patient Patrick Franklin is critically ill with multiple organ systems failure and requires high complexity decision making for assessment and support, frequent evaluation and titration of therapies, application of advanced monitoring technologies and extensive interpretation of multiple  databases and discussion with other appropriate health care personnel such as bedside nurses, social workers, case Production designer, theatre/television/film, consultants, respiratory therapists, nutritionists, secretaries etc.,  Critical care time includes but is not restricted to just documentation time. Documentation can happen in parallel or sequential to care time depending on case mix urgency and priorities for the shift. So, overall critical Care Time devoted to patient care services described in this note is  30  Minutes.   This time  reflects time of care of this signee Dr Kalman Shan which includ does not reflect procedure time, or teaching time or supervisory time of PA/NP/Med student/Med Resident etc but could involve care discussion time     Dr. Kalman Shan, M.D., [facility].C.P Pulmonary and Critical Care Medicine Medical Director - [facility] ICU Staff Physician, [facility] Pulmonary and Critical Care Pager: [phone], If no answer or between  15:00h - 7:00h: call [phone]  06/05/2022 10:57 AM     LABS    PULMONARY No results for input(s): "PHART", "PCO2ART", "PO2ART", "HCO3", "TCO2", "O2SAT" in the last 168 hours.  Invalid input(s): "PCO2", "PO2"  CBC Recent Labs  Lab 06/03/22 0419 06/04/22 0641 06/05/22 0540  HGB 9.1* 9.3* 9.4*  HCT 28.4* 30.5* 31.0*  WBC 8.6 11.4* 9.4  PLT 160 213 231    COAGULATION No results for input(s): "INR" in the last 168 hours.   CARDIAC  No results for input(s): "TROPONINI" in the last 168 hours. No results for input(s): "PROBNP" in the last 168 hours.   CHEMISTRY Recent Labs  Lab 05/30/22 0981 05/31/22 0353 06/01/22 0349 06/02/22 0422 06/03/22 0419 06/04/22 0641 06/05/22 0540  NA 145   < > 139 139 138 141 140  K 3.8   < > 3.9 3.8 4.0 4.0 3.7  CL 113*   < > 107 108 107 110 105  CO2 23   < > 23 24 24 24 26   GLUCOSE 202*   < > 205* 237* 203* 170* 143*  BUN 15   < > 20 20 24* 23* 28*  CREATININE 0.76   < > 0.83 0.74 0.80 0.68 0.82  CALCIUM 7.3*   < > 7.4* 7.7* 7.7* 8.1* 8.3*  MG 1.9  --  2.0 2.0 2.1 2.2 2.1  PHOS 3.9  --   --  3.3 3.7 4.1 4.8*   < > = values in this interval not displayed.   Estimated Creatinine Clearance: 128.5 mL/min (by C-G formula based on SCr of 0.82 mg/dL).   LIVER Recent Labs  Lab 06/03/22 0419 06/05/22 0540  AST 21 22  ALT 14 15  ALKPHOS 86 103  BILITOT 0.4 0.6  PROT 4.7* 5.4*  ALBUMIN <1.5* 1.9*     INFECTIOUS Recent Labs  Lab 05/31/22 0353  LATICACIDVEN 1.1      ENDOCRINE CBG (last 3)  Recent Labs    06/04/22 2337 06/05/22 0330 06/05/22 0722  GLUCAP 136* 144* 150*         IMAGING x48h  - image(s) personally visualized  -   highlighted in bold DG Abd 1 View  Result Date: 06/04/2022 CLINICAL DATA:  Evaluate shunt catheter EXAM: ABDOMEN - 1 VIEW COMPARISON:  06/01/2022 FINDINGS: Shunt catheter is noted coiled within the pelvis. No acute kinking is seen. Feeding catheter is noted within the distal stomach directed towards the pylorus. IMPRESSION: Shunt catheter within the pelvis as described. Electronically Signed   By: Alcide Clever M.D.   On:  06/04/2022 21:22   

## 2022-06-05 NOTE — Progress Notes (Signed)
  NEUROSURGERY PROGRESS NOTE   Pt seen and examined. No issues overnight, ltEEG d/c'ed yestereday. Per RN, propranolol seems to help episodes of agitation.  EXAM: Temp:  [98.8 F (37.1 C)-100.4 F (38 C)] 99.3 F (37.4 C) (05/12 0400) Pulse Rate:  [103-143] 122 (05/12 0622) Resp:  [17-26] 20 (05/12 0700) BP: (110-134)/(61-90) 110/73 (05/12 0700) SpO2:  [100 %] 100 % (05/12 0622) Arterial Line BP: (107-121)/(68-80) 121/80 (05/11 1100) FiO2 (%):  [30 %] 30 % (05/12 0758) Intake/Output      05/11 0701 05/12 0700 05/12 0701 05/13 0700   NG/GT 1319.4    IV Piggyback 758.7    Total Intake(mL/kg) 2078.1 (20.5)    Urine (mL/kg/hr) 6975 (2.9) 310 (1.9)   Stool 350 35   Total Output 7325 345   Net -5246.9 -345        Stool Occurrence 1 x     Awake, intermittently tracks Not following commands Moves RUE purposefully Wound c/d/i  LABS: Lab Results  Component Value Date   CREATININE 0.82 06/05/2022   BUN 28 (H) 06/05/2022   NA 140 06/05/2022   K 3.7 06/05/2022   CL 105 06/05/2022   CO2 26 06/05/2022   Lab Results  Component Value Date   WBC 9.4 06/05/2022   HGB 9.4 (L) 06/05/2022   HCT 31.0 (L) 06/05/2022   MCV 99.7 06/05/2022   PLT 231 06/05/2022    IMPRESSION: - 52 y.o. male s/p revision cranioplasty complicated by SZ and sepsis - Episodes of agitation with hypertension, tachycardia, tachypnea likely sympathetic storm, improved with propranolol  PLAN: - Cont AEDs - Cont IV abx per PCCM - Planning MRI today. If done, can interrogate/reprogram shunt tomorrow.  Lisbeth Renshaw, MD [facility]

## 2022-06-05 NOTE — Progress Notes (Signed)
                                                                                                                                                                                                          Palliative Medicine Progress Note   Patient Name: Patrick Franklin       Date: 06/05/2022 DOB: 15-Feb-1970  Age: 52 y.o. MRN#: [id number] Attending Physician: Julio Sicks, MD Primary Care Physician: Charlott Rakes, MD Admit Date: 05/19/2022  Reason for Consultation/Follow-up: {Reason for Consult:23484}  HPI/Patient Profile: 52 y.o. 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 He subsequently developed a seizure disorder. He then had another stroke in 2020, this time it was left MCA. For the past 6 months, patient was having worsening headaches and was founf to have failure of his craniotomy.  He presented to [facility] on 04/25/2022 for craniotomy revision. Post-op course was complicated by status epilepticus requiring intubation.   Palliative Medicine has been consulted for goals of care discussions.  Subjective: ***  Objective:  Physical Exam          Vital Signs: BP 124/84   Pulse (!) 110   Temp 100.1 F (37.8 C) (Axillary)   Resp (!) 22   Ht 5\' 10"  (1.778 m)   Wt 101.2 kg   SpO2 100%   BMI 32.01 kg/m  SpO2: SpO2: 100 % O2 Device: O2 Device: Ventilator   LBM: Last BM Date : 06/04/22     Palliative Assessment/Data: ***     Palliative Medicine Assessment & Plan   Assessment: Principal Problem:   History of cranioplasty Active Problems:   Seizure (HCC)   Acquired skull defect   Shock (HCC)   Status epilepticus (HCC)   Severe sepsis (HCC)   VP (ventriculoperitoneal) shunt status   Acute respiratory failure with hypoxia (HCC)    Recommendations/Plan: Continue current interventions   Goals of Care and Additional Recommendations: Limitations on  Scope of Treatment: {Recommended Scope and Preferences:21019}  Code Status: DNR   Prognosis:  {Palliative Care Prognosis:23504}  Discharge Planning: {Palliative dispostion:23505}  Care plan was discussed with ***  Thank you for allowing the Palliative Medicine Team to assist in the care of this patient.   ***   Merry Proud, NP   Please contact Palliative Medicine Team phone at [phone] for questions and concerns.  For individual providers, please see AMION.

## 2022-06-06 ENCOUNTER — Inpatient Hospital Stay (HOSPITAL_COMMUNITY): Payer: Medicare Other

## 2022-06-06 DIAGNOSIS — Z9889 Other specified postprocedural states: Secondary | ICD-10-CM | POA: Diagnosis not present

## 2022-06-06 DIAGNOSIS — J9601 Acute respiratory failure with hypoxia: Secondary | ICD-10-CM | POA: Diagnosis not present

## 2022-06-06 DIAGNOSIS — G40901 Epilepsy, unspecified, not intractable, with status epilepticus: Secondary | ICD-10-CM | POA: Diagnosis not present

## 2022-06-06 DIAGNOSIS — R569 Unspecified convulsions: Secondary | ICD-10-CM | POA: Diagnosis not present

## 2022-06-06 DIAGNOSIS — Z982 Presence of cerebrospinal fluid drainage device: Secondary | ICD-10-CM | POA: Diagnosis not present

## 2022-06-06 LAB — CBC
HCT: 29.8 % — ABNORMAL LOW (ref 39.0–52.0)
Hemoglobin: 9.1 g/dL — ABNORMAL LOW (ref 13.0–17.0)
MCH: 30.4 pg (ref 26.0–34.0)
MCHC: 30.5 g/dL (ref 30.0–36.0)
MCV: 99.7 fL (ref 80.0–100.0)
Platelets: 243 10*3/uL (ref 150–400)
RBC: 2.99 MIL/uL — ABNORMAL LOW (ref 4.22–5.81)
RDW: 14.6 % (ref 11.5–15.5)
WBC: 7.3 10*3/uL (ref 4.0–10.5)
nRBC: 0 % (ref 0.0–0.2)

## 2022-06-06 LAB — COMPREHENSIVE METABOLIC PANEL
ALT: 17 U/L (ref 0–44)
AST: 26 U/L (ref 15–41)
Albumin: 2 g/dL — ABNORMAL LOW (ref 3.5–5.0)
Alkaline Phosphatase: 100 U/L (ref 38–126)
Anion gap: 6 (ref 5–15)
BUN: 29 mg/dL — ABNORMAL HIGH (ref 6–20)
CO2: 28 mmol/L (ref 22–32)
Calcium: 8.1 mg/dL — ABNORMAL LOW (ref 8.9–10.3)
Chloride: 106 mmol/L (ref 98–111)
Creatinine, Ser: 0.68 mg/dL (ref 0.61–1.24)
GFR, Estimated: >60 mL/min (ref >60)
Glucose, Bld: 116 mg/dL — ABNORMAL HIGH (ref 70–99)
Potassium: 3.7 mmol/L (ref 3.5–5.1)
Sodium: 140 mmol/L (ref 135–145)
Total Bilirubin: 0.4 mg/dL (ref 0.3–1.2)
Total Protein: 5.4 g/dL — ABNORMAL LOW (ref 6.5–8.1)

## 2022-06-06 LAB — GLUCOSE, CAPILLARY
Glucose-Capillary: 102 mg/dL — ABNORMAL HIGH (ref 70–99)
Glucose-Capillary: 105 mg/dL — ABNORMAL HIGH (ref 70–99)
Glucose-Capillary: 135 mg/dL — ABNORMAL HIGH (ref 70–99)
Glucose-Capillary: 137 mg/dL — ABNORMAL HIGH (ref 70–99)
Glucose-Capillary: 152 mg/dL — ABNORMAL HIGH (ref 70–99)
Glucose-Capillary: 85 mg/dL (ref 70–99)

## 2022-06-06 MED ORDER — POLYETHYLENE GLYCOL 3350 17 G PO PACK: 17.0000 g | PACK | Freq: Two times a day (BID) | ORAL | Status: DC | PRN

## 2022-06-06 MED ORDER — OLANZAPINE 10 MG IM SOLR: 10.0000 mg | Freq: Every evening | INTRAMUSCULAR | Status: DC | PRN

## 2022-06-06 MED ORDER — DOCUSATE SODIUM 50 MG/5ML PO LIQD: 100.0000 mg | Freq: Two times a day (BID) | ORAL | Status: DC | PRN

## 2022-06-06 MED ORDER — OLANZAPINE 10 MG IM SOLR
10.0000 mg | Freq: Every evening | INTRAMUSCULAR | Status: DC | PRN
  Administered 2022-06-06: 10 mg via INTRAVENOUS
  Filled 2022-06-06 (×2): qty 10

## 2022-06-06 MED ORDER — GLYCOPYRROLATE 0.2 MG/ML IJ SOLN
0.2000 mg | Freq: Four times a day (QID) | INTRAMUSCULAR | Status: DC
  Administered 2022-06-06 (×2): 0.2 mg via INTRAVENOUS
  Filled 2022-06-06 (×2): qty 1

## 2022-06-06 MED ORDER — MORPHINE SULFATE (PF) 4 MG/ML IV SOLN: 4.0000 mg | INTRAVENOUS | Status: DC | PRN

## 2022-06-06 NOTE — Progress Notes (Signed)
NAME:  Patrick Franklin, MRN:  [id number], DOB:  12-22-1970, LOS: 13 ADMISSION DATE:  04/26/2022, CONSULTATION DATE: 05/02/2022 REFERRING MD: Dr. Julio Sicks, CHIEF COMPLAINT: Altered mental status  History of Present Illness:  52 year old male with hypertension, hyperlipidemia and prior right MCA stroke, complicated with malignant cerebral edema requiring decompressive craniectomy, underwent cranioplasty and VP shunt, who presented with increasing headache, noted to have cranioplasty failure, today he underwent cranioplasty again, upon arrival to ICU patient was not responding, he had forced left gaze deviation, he became hypotensive, hypoxic and started gurgling.  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), Asthma, Cardiomegaly (04/13/2016), Cerebral embolism with cerebral infarction (10/03/2018), DVT (deep venous thrombosis) (HCC), Dysphagia following other cerebrovascular disease, Elevated serum creatinine, Essential hypertension (05/15/2017), GERD without esophagitis, Headache, Hemiparesis affecting left side as late effect of stroke (08/09/2017), History of ischemic right MCA stroke (2017), History of left MCA stroke (2020), History of renal stone (08/22/2017), Homonymous bilateral field defects, Hyperlipidemia (05/15/2017), 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   Significant Hospital Events: Including procedures, antibiotic start and stop dates in addition to other pertinent events   4/30 INTUBATED 4.30 IO line place .  >>5/1 5/1- RT IJ CVL 5/2 - lefg brachial A- line 5/6 weaning sedation while on EEG, remains intubated 5/7 Cultures BAL - Moderate Candidate - BRONCH MRSA PCR neg 5/8 - CORTRAK 5/9 Sedation off. Starting to open eyes and trigger vent.  5/10 - Started marginally waking up yesterday.  Still with low grade fevers. Several episodes concerning for sz yesterday, but no EEG correlation.  This morning he is tachycardic, tachypneic, and appears uncomfortable per RN. Does improve with fentanyl, but effects are short lived.  Diuresing well. 2L neg x 24 hours. Remains 14L pos total.  5/11 - 5/11 - 30% on vent. On Tube feeeds. Afebvile.  Fever curbe better. Neuro/NSGY concerned about autonomic storming -> Dr Iver Nestle starting inderal On pron sedation  Interim History / Subjective:  NAEON. Continues to have intermittent agitation/jerking/storming. Responded to Versed earlier. Family awaiting updates from neurosurgery.  Objective   Blood pressure 111/74, pulse (!) 102, temperature 98.8 F (37.1 C), temperature source Oral, resp. rate 20, height 5\' 10"  (1.778 m), weight 101.2 kg, SpO2 100 %.    Vent Mode: PRVC FiO2 (%):  [30 %] 30 % Set Rate:  [20 bmp] 20 bmp Vt Set:  [580 mL] 580 mL PEEP:  [5 cmH20] 5 cmH20 Pressure Support:  [8 cmH20] 8 cmH20 Plateau Pressure:  [14 cmH20-15 cmH20] 14 cmH20   Intake/Output Summary (Last 24 hours) at 06/06/2022 2952 Last data filed at 06/06/2022 0800 Gross per 24 hour  Intake 2167.31 ml  Output 5140 ml  Net -2972.69 ml    Physical Exam: General: Adult male, chronically ill appearing, resting in bed, in NAD. Neuro: On vent, no continuous sedation. Opens eyes but does not follow commands. HEENT: [place]/AT. Sclerae anicteric. ETT in place. Cardiovascular: RRR, no M/R/G.  Lungs: Respirations even and unlabored.  CTA bilaterally, No W/R/R. Abdomen: BS x 4, soft, NT/ND.  Musculoskeletal: No gross deformities, no edema.  Skin: Intact, warm, no rashes.    Resolved  Hospital Problem list   Acute kidney injury  Propofol induced hypertriglyceridemia Lactic acidosis due to recurrent seizures/septic shock Hiccups Bleeding from gums and IV puncture site Acute blood loss anemia perioperatively Thrombocytopenia critical illness  Assessment & Plan:   Status epilepticus. Acute encephalopathy due to recurrent seizures. - AED's per neuro, limited additional options.  Prior right MCA stroke s/p craniectomy and now with cranioplasty. Hydrocephalus s/p VP shunt. - Per neurosurgery.  Concern for Autonomic storm concern 06/04/22 -started propranalol - Continue Propranolol, Klonopin.  Acute hypoxic respiratory failure 2/2 PNA - s/p intubation 4/30. - Family awaiting update from neurosurgery this AM before decision regarding one way extubation (they are NOT in favor of trach). - Bronchial hygiene. - SBT as able.  Severe sepsis with septic shock due to bilateral multifocal pneumonia. - Continue Zyvox through 5/14. - Keep CVL for now given inability to place PIV's. - Might need PICC depending on family decisions.  Anasarca - +2L now. - Continue diuresis.  Best Practice (right click and "Reselect all SmartList Selections" daily)   Diet/type: NPO tube feeds DVT prophylaxis: Subcu Lovenox GI prophylaxis: PPI Lines: NA Foley:  No Code Status:   DNR at admission . No LTAC/ No PEG Last date of multidisciplinary goals of care discussion. Discussed with sister and brother in law at bedside. They are awaiting update from neurosurgery today 5/13 before making decisions regarding plan moving forward.   Rutherford Guys, PA - [place] [facility] For pager details, please see AMION or use Epic chat  After 1900, please call [facility] for cross coverage needs 06/06/2022, 9:33 AM

## 2022-06-06 NOTE — Progress Notes (Signed)
Transported patient to MRI while patient was on the ventilator. Patient remained stable during transport. 

## 2022-06-06 NOTE — Procedures (Signed)
   Providing Compassionate, Quality Care - Together   Patient: Patrick Franklin   Location: 3X10G/2I94W-54  Shunt Manufacturer/Model: Medtronic Strata  Starting Setting: 2.0  Ending Setting: 1.0  The patient tolerated the procedure well. The final setting was verified on the programmer.    MR BRAIN WO CONTRAST  Result Date: 06/06/2022 CLINICAL DATA:  Altered mental status EXAM: MRI HEAD WITHOUT CONTRAST TECHNIQUE: Multiplanar, multiecho pulse sequences of the brain and surrounding structures were obtained without intravenous contrast. COMPARISON:  Head CT 06/02/2022 FINDINGS: Brain: Persistent large amount of right-sided extra-axial blood products superimposed on advanced encephalomalacia at the site of remote right MCA territory infarct. 3 mm of leftward midline shift. Small amount of subdural blood over the left convexity. Unchanged size and configuration of the shunted ventricles with shunt catheter tip near the left foramen of Monro. Susceptibility effects partially obscure diffusion-weighted imaging but no acute ischemia is visible. Vascular: Left vertebral and bilateral internal carotid artery flow voids are normal. Nonvisualization of right vertebral artery. Skull and upper cervical spine: Right craniectomy and cranioplasty Sinuses/Orbits: Fluid within the mastoid air cells bilaterally. Opacification of the sphenoid sinuses. Normal orbits. Other: None. IMPRESSION: 1. Persistent large amount of right-sided extra-axial blood products superimposed on advanced encephalomalacia at the site of remote right MCA territory infarct. 3 mm of leftward midline shift. 2. Small amount of subdural blood over the left convexity. 3. Unchanged size and configuration of the shunted ventricles with shunt catheter tip near the left foramen of Monro. Electronically Signed   By: Deatra Robinson M.D.   On: 06/06/2022 02:09   DG Abd 1 View  Result Date: 06/04/2022 CLINICAL DATA:  Evaluate shunt catheter EXAM:  ABDOMEN - 1 VIEW COMPARISON:  06/01/2022 FINDINGS: Shunt catheter is noted coiled within the pelvis. No acute kinking is seen. Feeding catheter is noted within the distal stomach directed towards the pylorus. IMPRESSION: Shunt catheter within the pelvis as described. Electronically Signed   By: Alcide Clever M.D.   On: 06/04/2022 21:22       Ephriam Knuckles, AGNP-C Nurse Practitioner 06/06/2022 1:22 PM    [facility] [address], [place], [place] [address] P: [phone]    F: [phone]

## 2022-06-06 NOTE — Progress Notes (Signed)
eLink Physician-Brief Progress Note Patient Name: Patrick Franklin DOB: 1970/03/23 MRN: [id number]   Date of Service  06/06/2022  HPI/Events of Note  52 year old male with a history of MCA stroke in 2017 initially presented with revision cranioplasty and developed seizure disorder and status epilepticus.  He has been in the ICU for multiple days with management of his status and eventually extubated on 5/13 with intention to transition to comfort care if things do not go well.  He tends to get agitated at nighttime.  Currently prescribed fentanyl and Versed.  eICU Interventions  I suspect these are remnants of his recent intubation, discontinue the Versed and fentanyl.  Repeat ECG given last QTc was >500  As needed olanzapine at nighttime     Intervention Category Minor Interventions: Agitation / anxiety - evaluation and management  Anntonette Madewell 06/06/2022, 8:33 PM

## 2022-06-06 NOTE — Progress Notes (Signed)
Subjective: NAEO. Family ( sister, parents, brother in law at bedside)  ROS: Unable to obtain due to poor mental status  Examination  Vital signs in last 24 hours: Temp:  [97.8 F (36.6 C)-99.1 F (37.3 C)] 98.8 F (37.1 C) (05/13 1200) Pulse Rate:  [93-115] 105 (05/13 1200) Resp:  [17-24] 24 (05/13 1200) BP: (98-134)/(68-107) 109/78 (05/13 1200) SpO2:  [96 %-100 %] 100 % (05/13 1200) FiO2 (%):  [30 %] 30 % (05/13 1035)  General: lying in bed, intubated Neuro: awake, looks around in room but doesn't track, PERLA, doesn't follow commands, anti-gravity strength in right upper and lower extremities, withdraws to noxious stimul in left upper and lower extremities  Basic Metabolic Panel: Recent Labs  Lab 06/01/22 0349 06/02/22 0422 06/03/22 0419 06/04/22 0641 06/05/22 0540 06/06/22 0652  NA 139 139 138 141 140 140  K 3.9 3.8 4.0 4.0 3.7 3.7  CL 107 108 107 110 105 106  CO2 23 24 24 24 26 28   GLUCOSE 205* 237* 203* 170* 143* 116*  BUN 20 20 24* 23* 28* 29*  CREATININE 0.83 0.74 0.80 0.68 0.82 0.68  CALCIUM 7.4* 7.7* 7.7* 8.1* 8.3* 8.1*  MG 2.0 2.0 2.1 2.2 2.1  --   PHOS  --  3.3 3.7 4.1 4.8*  --     CBC: Recent Labs  Lab 06/02/22 0422 06/03/22 0419 06/04/22 0641 06/05/22 0540 06/06/22 0652  WBC 8.3 8.6 11.4* 9.4 7.3  HGB 10.0* 9.1* 9.3* 9.4* 9.1*  HCT 30.7* 28.4* 30.5* 31.0* 29.8*  MCV 95.3 96.6 98.4 99.7 99.7  PLT 139* 160 213 231 243   Coagulation Studies: No results for input(s): "LABPROT", "INR" in the last 72 hours.  Imaging MR Brain wo contrast 06/06/2022: Persistent large amount of right-sided extra-axial blood products superimposed on advanced encephalomalacia at the site of remote right MCA territory infarct. 3 mm of leftward midline shift. Small amount of subdural blood over the left convexity. Unchanged size and configuration of the shunted ventricles with shunt catheter tip near the left foramen of Monro.  ASSESSMENT AND PLAN: 52 year old male  admitted for cranioplasty revision on 4/30.   Had frequent seizures after reducing sedation arising from left occipital region.   Status epilepticus -No clinical seizures. Clinically patient's mental status is improving but still not following commands. Could be post-ictal   Recommendations -Continue Keppra 2000mg  BID, Clonazepam to 0.5mg  TID, Vimpat 200 mg twice daily, Depakote 1000 mg twice daily, Perampanel 4mg  QHS - Plan to do one way extubation per critical care team as patient would have wanted trach/peg - Neurology available peripherally if needed.  - Continue seizure precautions - PRN IV versed for clinical seizures -Discussed plan with family at bedside as well as ICU team    I have spent a total of  35 minutes with the patient reviewing hospital notes,  test results, labs and examining the patient as well as establishing an assessment and plan.  > 50% of time was spent in direct patient care.    Lindie Spruce Epilepsy [facility] For questions after 5pm please refer to AMION to reach the Neurologist on call

## 2022-06-06 NOTE — IPAL (Signed)
  Interdisciplinary Goals of Care Family Meeting   Date carried out: 06/06/2022  Location of the meeting: Bedside  Member's involved: Physician, Bedside Registered Nurse, and Family Member or next of kin  Durable Power of Attorney or acting medical decision maker: father and sister    Discussion: We discussed goals of care for Patrick Franklin .    Discussed his previously stated directives to not be on life support long term, his desire for quality over quantity of life.  Plan is one-way extubation, transition to comfort if does not do well.  If does okay take it day by day.  Code status:   Code Status: DNR   Disposition: See above  Time spent for the meeting: 10 mins    Lorin Glass, MD  06/06/2022, 1:46 PM

## 2022-06-06 NOTE — Progress Notes (Signed)
Palliative Medicine Progress Note   Patient Name: Patrick Franklin       Date: 06/06/2022 DOB: [birth year]  Age: 52 y.o. MRN#: [id number] Attending Physician: Julio Sicks, MD Primary Care Physician: Charlott Rakes, MD Admit Date: 05/17/2022  Reason for Consultation/Follow-up: {Reason for Consult:23484}  HPI/Patient Profile: 52 y.o. 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 He subsequently developed a seizure disorder. He then had another stroke in 2020, this time it was left MCA. For the past 6 months, patient was having worsening headaches and was founf to have failure of his craniotomy.  He presented to [facility] on 04/29/2022 for craniotomy revision. Post-op course was complicated by status epilepticus requiring intubation.   Palliative Medicine has been consulted for goals of care discussions.  Subjective: Chart reviewed and patient assessed at bedside.  He was extubated earlier today.  He is awake and making eye contact. Per his parents (currently visiting), he nodded his head "yes" in response to a question they asked. He is not yet attempting to verbalize.   Objective:  Physical Exam          Vital Signs: BP 104/77   Pulse (!) 111   Temp 98.6 F (37 C) (Oral)   Resp (!) 23   Ht 5\' 10"  (1.778 m)   Wt 101.2 kg   SpO2 100%   BMI 32.01 kg/m  SpO2: SpO2: 100 % O2 Device: O2 Device: Nasal Cannula O2 Flow Rate: O2 Flow Rate (L/min): 4 L/min   LBM: Last BM Date : 06/06/22     Palliative Assessment/Data: ***     Palliative Medicine Assessment & Plan   Assessment: Principal Problem:   History of cranioplasty Active Problems:   Seizure (HCC)   Acquired  skull defect   Shock (HCC)   Status epilepticus (HCC)   Severe sepsis (HCC)   VP (ventriculoperitoneal) shunt status   Acute respiratory failure with hypoxia (HCC)    Recommendations/Plan: ***  Goals of Care and Additional Recommendations: Limitations on Scope of Treatment: {Recommended Scope and Preferences:21019}  Code Status:   Prognosis:  {Palliative Care Prognosis:23504}  Discharge Planning: {Palliative dispostion:23505}  Care plan was discussed with ***  Thank you for allowing the Palliative Medicine Team to assist in  the care of this patient.   ***   Merry Proud, NP   Please contact Palliative Medicine Team phone at [phone] for questions and concerns.  For individual providers, please see AMION.

## 2022-06-06 NOTE — Progress Notes (Signed)
Providing Compassionate, Quality Care - Together   Subjective: Patient alert. No recent seizure activity reported. Parents and sister at bedside.  Objective: Vital signs in last 24 hours: Temp:  [97.8 F (36.6 C)-99.1 F (37.3 C)] 98.8 F (37.1 C) (05/13 1200) Pulse Rate:  [93-115] 105 (05/13 1200) Resp:  [17-24] 24 (05/13 1200) BP: (98-134)/(68-107) 109/78 (05/13 1200) SpO2:  [96 %-100 %] 100 % (05/13 1200) FiO2 (%):  [30 %] 30 % (05/13 1035)  Intake/Output from previous day: 05/12 0701 - 05/13 0700 In: 2047.8 [NG/GT:1320.3; IV Piggyback:727.6] Out: 4750 [Urine:4215; Stool:535] Intake/Output this shift: Total I/O In: 665.1 [NG/GT:315; IV Piggyback:350.1] Out: 1635 [Urine:1600; Stool:35]  Alert PERRLA, tracks Purposeful movement RUE No following commands during assessment Cranioplasty site clean, dry, and intact   Lab Results: Recent Labs    06/05/22 0540 06/06/22 0652  WBC 9.4 7.3  HGB 9.4* 9.1*  HCT 31.0* 29.8*  PLT 231 243   BMET Recent Labs    06/05/22 0540 06/06/22 0652  NA 140 140  K 3.7 3.7  CL 105 106  CO2 26 28  GLUCOSE 143* 116*  BUN 28* 29*  CREATININE 0.82 0.68  CALCIUM 8.3* 8.1*    Studies/Results: MR BRAIN WO CONTRAST  Result Date: 06/06/2022 CLINICAL DATA:  Altered mental status EXAM: MRI HEAD WITHOUT CONTRAST TECHNIQUE: Multiplanar, multiecho pulse sequences of the brain and surrounding structures were obtained without intravenous contrast. COMPARISON:  Head CT 06/02/2022 FINDINGS: Brain: Persistent large amount of right-sided extra-axial blood products superimposed on advanced encephalomalacia at the site of remote right MCA territory infarct. 3 mm of leftward midline shift. Small amount of subdural blood over the left convexity. Unchanged size and configuration of the shunted ventricles with shunt catheter tip near the left foramen of Monro. Susceptibility effects partially obscure diffusion-weighted imaging but no acute ischemia is  visible. Vascular: Left vertebral and bilateral internal carotid artery flow voids are normal. Nonvisualization of right vertebral artery. Skull and upper cervical spine: Right craniectomy and cranioplasty Sinuses/Orbits: Fluid within the mastoid air cells bilaterally. Opacification of the sphenoid sinuses. Normal orbits. Other: None. IMPRESSION: 1. Persistent large amount of right-sided extra-axial blood products superimposed on advanced encephalomalacia at the site of remote right MCA territory infarct. 3 mm of leftward midline shift. 2. Small amount of subdural blood over the left convexity. 3. Unchanged size and configuration of the shunted ventricles with shunt catheter tip near the left foramen of Monro. Electronically Signed   By: Deatra Robinson M.D.   On: 06/06/2022 02:09   DG Abd 1 View  Result Date: 06/04/2022 CLINICAL DATA:  Evaluate shunt catheter EXAM: ABDOMEN - 1 VIEW COMPARISON:  06/01/2022 FINDINGS: Shunt catheter is noted coiled within the pelvis. No acute kinking is seen. Feeding catheter is noted within the distal stomach directed towards the pylorus. IMPRESSION: Shunt catheter within the pelvis as described. Electronically Signed   By: Alcide Clever M.D.   On: 06/04/2022 21:22    Assessment/Plan: Patient underwent cranioplasty by Dr. Jordan Likes on 05/07/2022. His postoperative course was complicated by status epilepticus. Family wish to respect the patient's wishes for no trach or PEG. Discussions ongoing for one-way extubation. The patient is currently weaning on the ventilator. Follow up MRI without significant change.   LOS: 13 days   -Continue supportive efforts. -Discussed the situation with family. They express the patient would not wish to have a trach or PEG. CCM discussing one way extubation with family to honor the patient's wishes.   Val Eagle,  DNP, AGNP-C Nurse Practitioner  [facility] [address], [address], [place], [place]  [address] P: [phone]    F: [phone]  06/06/2022, 1:26 PM

## 2022-06-06 NOTE — Procedures (Signed)
Extubation Procedure Note  Patient Details:   Name: Patrick Franklin DOB: [birth year] MRN: [id number]   Airway Documentation:    Vent end date: 06/06/22 Vent end time: 1425   Evaluation  O2 sats: stable throughout Complications: No apparent complications Patient did tolerate procedure well. Bilateral Breath Sounds: Rhonchi, Diminished   No, pt could not speak post extubation.  Pt extubated to 4 l/m [place] per physician's order and in accordance with the family's wishes.  Audrie Lia 06/06/2022, 2:26 PM

## 2022-06-07 DIAGNOSIS — Z9889 Other specified postprocedural states: Secondary | ICD-10-CM | POA: Diagnosis not present

## 2022-06-07 DIAGNOSIS — Z66 Do not resuscitate: Secondary | ICD-10-CM

## 2022-06-07 LAB — GLUCOSE, CAPILLARY
Glucose-Capillary: 143 mg/dL — ABNORMAL HIGH (ref 70–99)
Glucose-Capillary: 144 mg/dL — ABNORMAL HIGH (ref 70–99)

## 2022-06-07 MED ORDER — GLYCOPYRROLATE 0.2 MG/ML IJ SOLN: 0.2000 mg | INTRAMUSCULAR | Status: DC | PRN

## 2022-06-07 MED ORDER — ACETAMINOPHEN 325 MG PO TABS: 650.0000 mg | ORAL_TABLET | Freq: Four times a day (QID) | ORAL | Status: DC | PRN

## 2022-06-07 MED ORDER — GLYCOPYRROLATE 1 MG PO TABS: 1.0000 mg | ORAL_TABLET | ORAL | Status: DC | PRN

## 2022-06-07 MED ORDER — POLYVINYL ALCOHOL 1.4 % OP SOLN: 1.0000 [drp] | Freq: Four times a day (QID) | OPHTHALMIC | Status: DC | PRN

## 2022-06-07 MED ORDER — MORPHINE BOLUS VIA INFUSION: 0.0000 mg | INTRAVENOUS | Status: DC | PRN

## 2022-06-07 MED ORDER — SODIUM CHLORIDE 0.9 % IV SOLN: INTRAVENOUS | Status: DC

## 2022-06-07 MED ORDER — ACETAMINOPHEN 650 MG RE SUPP: 650.0000 mg | Freq: Four times a day (QID) | RECTAL | Status: DC | PRN

## 2022-06-07 MED ORDER — LORAZEPAM 2 MG/ML IJ SOLN
2.0000 mg | INTRAMUSCULAR | Status: DC | PRN
  Administered 2022-06-07: 4 mg via INTRAVENOUS
  Filled 2022-06-07: qty 2

## 2022-06-07 MED ORDER — MORPHINE 100MG IN NS 100ML (1MG/ML) PREMIX INFUSION
0.0000 mg/h | INTRAVENOUS | Status: DC
  Administered 2022-06-07: 5 mg/h via INTRAVENOUS
  Filled 2022-06-07: qty 100

## 2022-06-08 LAB — CULTURE, RESPIRATORY W GRAM STAIN

## 2022-06-25 NOTE — IPAL (Signed)
  Interdisciplinary Goals of Care Family Meeting   Date carried out: 06/09/2022  Location of the meeting: Bedside  Member's involved: Bedside Registered Nurse and Family Member or next of kin, Physician Assistant.  Durable Power of Insurance risk surveyor: [name] sister.    Discussion: I have had an extensive bedside discussion with pt's sister and brother in law at the bedside. The sister works as a Radiographer, therapeutic. They fortunately have had discussions with pt in the past and it was very clear that pt would not want prolonged intubation/trach/vent/PEG/etc.  He had one way extubation yesterday with hopes that he would tolerate it; however, he unfortunately has not done so and now is clearly in respiratory distress. Family is at the bedside and in agreement and after discussion current circumstances and known wishes, family has decided to transition to comfort measures. Pt has a son in [place], [place] and parents locally. Sister will be calling them to come the hospital this AM. We will place comfort orders and hope to buy some time for additional family to arrive; however, family aware and agrees that if pts comfort is of priority. Emotional support provided. Chaplain offered but family will call their own pastor.  Code status:   Code Status: DNR   Disposition: In-patient comfort care  Time spent for the meeting: 15 minutes.    Rutherford Guys, PA-C  06/16/2022, 8:09 AM

## 2022-06-25 NOTE — Progress Notes (Signed)
Family decided to proceed with voluntary removal of life support.  Patient just expired.

## 2022-06-25 NOTE — Discharge Summary (Signed)
Physician Discharge Summary  Patient ID: Patrick Franklin MRN: [id number] DOB/AGE: 1970/07/21 52 y.o.  Admit date: 05/21/2022 Discharge date: 06/13/2022  Admission Diagnoses:  Discharge Diagnoses:  Principal Problem:   History of cranioplasty Active Problems:   Seizure (HCC)   Acquired skull defect   Shock (HCC)   Status epilepticus (HCC)   Severe sepsis (HCC)   VP (ventriculoperitoneal) shunt status   Acute respiratory failure with hypoxia (HCC)   DNR (do not resuscitate)   Discharged Condition: Deceased  Hospital Course: Patient was admitted to the hospital for repair of cranioplasty failure.  The patient remotely underwent decompressive craniectomy and subsequent cranioplasty many years ago at an outside institution.  The patient presented now with worsening headaches and worsening seizures.  The patient's bone flap did become dislodged and was markedly sunken.  The patient was taken to the operating room where he underwent uncomplicated elevation of his cranioplasty flap with revision of his cranioplasty.  Postoperative the patient initially awaken from anesthesia.  He was awake and aware.  He was following commands.  He then developed status epilepticus which did not break with multiple rounds of Ativan and Keppra.  He was eventually put into burst suppression.  He continued to seize.  His seizures were eventually controlled.  He was gradually weaned from medication and began to awaken but was not following commands and was not purposeful.  The situation was then complicated by significant hospital-acquired pneumonia.  The patient's family stated that he did not wish any further aggressive medical care given his overall situation.  The patient was made DNR and was allowed to expire.  Consults:   Significant Diagnostic Studies:   Treatments:   Discharge Exam: Blood pressure (!) 77/67, pulse (!) 143, temperature 99 F (37.2 C), temperature source Axillary, resp. rate 20, height  5\' 10"  (1.778 m), weight 101.2 kg, SpO2 (!) 45 %.  Disposition: Deceased   Allergies as of 06-26-22   No Active Allergies      Medication List     ASK your doctor about these medications    aspirin EC 81 MG tablet Take 81 mg by mouth daily.   atorvastatin 80 MG tablet Commonly known as: LIPITOR Take 80 mg by mouth at bedtime.   Baclofen 5 MG Tabs Take 5 mg by mouth in the morning, at noon, and at bedtime.   bisacodyl 10 MG suppository Commonly known as: DULCOLAX Place 10 mg rectally daily as needed for moderate constipation.   DULoxetine 60 MG capsule Commonly known as: CYMBALTA Take 60 mg by mouth daily. Take 1 capsule every morning (Take with 30mg  capsule for total of 90mg )   DULoxetine 30 MG capsule Commonly known as: CYMBALTA Take 30 mg by mouth daily. Take 1 capsule every morning (Take with 60mg  capsule for total of 90mg )   ipratropium-albuterol 0.5-2.5 (3) MG/3ML Soln Commonly known as: DUONEB Take 3 mLs by nebulization every 4 (four) hours as needed (shortness of breath or wheezing).   Melatonin 10 MG Tabs Take 10 mg by mouth at bedtime.   menthol-cetylpyridinium 3 MG lozenge Commonly known as: CEPACOL Take 1 lozenge by mouth every 2 (two) hours as needed for sore throat.   metoprolol tartrate 50 MG tablet Commonly known as: LOPRESSOR Take 50 mg by mouth 2 (two) times daily.   multivitamin with minerals Tabs tablet Take 1 tablet by mouth daily.   polyethylene glycol 17 g packet Commonly known as: MIRALAX / GLYCOLAX Take 17 g by mouth 2 (two) times  daily.   Senna 8.6 MG Caps Take 2 capsules by mouth every 12 (twelve) hours as needed (constipation).   sertraline 100 MG tablet Commonly known as: ZOLOFT Take 100 mg by mouth daily.   Systane 0.4-0.3 % Soln Generic drug: Polyethyl Glycol-Propyl Glycol Place 1 drop into both eyes in the morning, at noon, in the evening, and at bedtime.   tamsulosin 0.4 MG Caps capsule Commonly known as:  FLOMAX Take 0.4 mg by mouth daily.   topiramate 50 MG tablet Commonly known as: TOPAMAX Take 50 mg by mouth daily. Give 50mg  tablet with a 25mg  tablet for total dose 75mg    topiramate 25 MG tablet Commonly known as: TOPAMAX Take 25 mg by mouth daily. Give 50mg  tablet with a 25mg  tablet for total dose 75mg    traZODone 150 MG tablet Commonly known as: DESYREL Take 150 mg by mouth at bedtime.         SignedKathaleen Maser Ellerie Arenz 06/13/2022, 10:44 AM

## 2022-06-25 NOTE — Progress Notes (Signed)
NAME:  Patrick Franklin, MRN:  [id number], DOB:  03/26/1970, LOS: 14 ADMISSION DATE:  05/12/2022, CONSULTATION DATE: 05/21/2022 REFERRING MD: Dr. Julio Sicks, CHIEF COMPLAINT: Altered mental status  History of Present Illness:  52 year old male with hypertension, hyperlipidemia and prior right MCA stroke, complicated with malignant cerebral edema requiring decompressive craniectomy, underwent cranioplasty and VP shunt, who presented with increasing headache, noted to have cranioplasty failure, today he underwent cranioplasty again, upon arrival to ICU patient was not responding, he had forced left gaze deviation, he became hypotensive, hypoxic and started gurgling.  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), Asthma, Cardiomegaly (04/13/2016), Cerebral embolism with cerebral infarction (10/03/2018), DVT (deep venous thrombosis) (HCC), Dysphagia following other cerebrovascular disease, Elevated serum creatinine, Essential hypertension (05/15/2017), GERD without esophagitis, Headache, Hemiparesis affecting left side as late effect of stroke (08/09/2017), History of ischemic right MCA stroke (2017), History of left MCA stroke (2020), History of renal stone (08/22/2017), Homonymous bilateral field defects, Hyperlipidemia (05/15/2017), 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   Significant Hospital Events: Including procedures, antibiotic start and stop dates in addition to other pertinent events   4/30 INTUBATED 4.30 IO line place .  >>5/1 5/1- RT IJ CVL 5/2 - lefg brachial A- line 5/6 weaning sedation while on EEG, remains intubated 5/7 Cultures BAL - Moderate Candidate - BRONCH MRSA PCR neg 5/8 - CORTRAK 5/9 Sedation off. Starting to open eyes and trigger vent.  5/10 - Started marginally waking up yesterday.  Still with low grade fevers. Several episodes concerning for sz yesterday, but no EEG correlation.  This morning he is tachycardic, tachypneic, and appears uncomfortable per RN. Does improve with fentanyl, but effects are short lived.  Diuresing well. 2L neg x 24 hours. Remains 14L pos total.  5/11 - 5/11 - 30% on vent. On Tube feeeds. Afebvile.  Fever curbe better. Neuro/NSGY concerned about autonomic storming -> Dr Iver Nestle starting inderal On pron sedation 5/13 extubated one way extubation 5/14 transition to comfort care  Interim History / Subjective:  Extubated yesterday. Unfortunately has had tachypnea and increased work of breathing. Family ready to transition to comfort care. [name] son and parents planning to come to the hospital today.  Objective   Blood pressure 106/79, pulse (!) 128, temperature (!) 100.8 F (38.2 C), temperature source Axillary, resp. rate (!) 24, height 5\' 10"  (1.778 m), weight 101.2 kg, SpO2 100 %.    Vent Mode: PSV;CPAP FiO2 (%):  [30 %-36 %] 36 % Pressure Support:  [8 cmH20] 8 cmH20   Intake/Output Summary (Last 24 hours) at 06/22/2022 0758 Last data filed at 06/09/2022 0600 Gross per 24 hour  Intake 2055.28 ml  Output 3585 ml  Net -1529.72 ml    Physical Exam: General: Adult male, chronically ill appearing, in distress. Neuro: Eyes open but does not follow all commands. Withdraws to noxious stimuli. HEENT: Cranioplasty site noted. Sclerae anicteric. Cardiovascular: Tachy, regular, no M/R/G.  Lungs: Respirations shallow and labored. Course bilaterally. Abdomen: BS x 4, soft, NT/ND.  Musculoskeletal: No gross deformities, no edema.  Skin: Intact, warm, no  rashes.    Resolved Hospital Problem list   Acute kidney injury Propofol induced hypertriglyceridemia Lactic acidosis due to  recurrent seizures/septic shock Hiccups Bleeding from gums and IV puncture site Acute blood loss anemia perioperatively Thrombocytopenia critical illness  Assessment & Plan:   Status epilepticus. Acute encephalopathy due to recurrent seizures. Prior right MCA stroke s/p craniectomy and now with cranioplasty. Hydrocephalus s/p VP shunt. Acute hypoxic respiratory failure 2/2 PNA - s/p intubation 4/30 with extubation 5/13. Severe sepsis with septic shock due to bilateral multifocal pneumonia - s/p course Zyvox.   Discussion: I have had an extensive bedside discussion with pt's sister and brother in law at the bedside. The sister works as a Radiographer, therapeutic. They fortunately have had discussions with pt in the past and it was very clear that pt would not want prolonged intubation/trach/vent/PEG/etc.  He had one way extubation yesterday with hopes that he would tolerate it; however, he unfortunately has not done so and now is clearly in respiratory distress. Family is at the bedside and in agreement and after discussion current circumstances and known wishes, family has decided to transition to comfort measures. Pt has a son in [place], [place] and parents locally. Sister will be calling them to come the hospital this AM. We will place comfort orders and hope to buy some time for additional family to arrive; however, family aware and agrees that if pts comfort is of priority. Emotional support provided. Chaplain offered but family will call their own pastor.  See separate IPAL note.   Best Practice (right click and "Reselect all SmartList Selections" daily)  N/A   Rutherford Guys, PA - [facility] For pager details, please see AMION or use Epic chat  After 1900, please call [facility] for cross coverage needs 06/15/2022, 7:58  AM

## 2022-06-25 DEATH — deceased

## 2022-07-22 ENCOUNTER — Ambulatory Visit: Payer: Medicare Other | Admitting: Neurology
